# Patient Record
Sex: Male | Born: 1935 | Race: Black or African American | Hispanic: No | Marital: Married | Smoking: Former smoker
Health system: Southern US, Community
[De-identification: ages and names within clinical notes are randomized; demographics above are authoritative.]

## PROBLEM LIST (undated history)

## (undated) DIAGNOSIS — K219 Gastro-esophageal reflux disease without esophagitis: Secondary | ICD-10-CM

## (undated) DIAGNOSIS — I499 Cardiac arrhythmia, unspecified: Secondary | ICD-10-CM

## (undated) DIAGNOSIS — C801 Malignant (primary) neoplasm, unspecified: Secondary | ICD-10-CM

## (undated) DIAGNOSIS — Z940 Kidney transplant status: Secondary | ICD-10-CM

## (undated) DIAGNOSIS — I10 Essential (primary) hypertension: Secondary | ICD-10-CM

## (undated) DIAGNOSIS — N186 End stage renal disease: Secondary | ICD-10-CM

## (undated) DIAGNOSIS — R0602 Shortness of breath: Secondary | ICD-10-CM

## (undated) DIAGNOSIS — J189 Pneumonia, unspecified organism: Secondary | ICD-10-CM

## (undated) DIAGNOSIS — E611 Iron deficiency: Secondary | ICD-10-CM

## (undated) DIAGNOSIS — R011 Cardiac murmur, unspecified: Secondary | ICD-10-CM

## (undated) DIAGNOSIS — N390 Urinary tract infection, site not specified: Secondary | ICD-10-CM

## (undated) DIAGNOSIS — M1990 Unspecified osteoarthritis, unspecified site: Secondary | ICD-10-CM

## (undated) DIAGNOSIS — I639 Cerebral infarction, unspecified: Secondary | ICD-10-CM

## (undated) HISTORY — DX: Urinary tract infection, site not specified: N39.0

## (undated) HISTORY — DX: Unspecified osteoarthritis, unspecified site: M19.90

## (undated) HISTORY — PX: AV FISTULA PLACEMENT: SHX1204

## (undated) HISTORY — DX: Iron deficiency: E61.1

## (undated) HISTORY — PX: PROSTATE SURGERY: SHX751

## (undated) HISTORY — DX: Cerebral infarction, unspecified: I63.9

## (undated) HISTORY — PX: KIDNEY TRANSPLANT: SHX239

---

## 2000-09-06 ENCOUNTER — Encounter: Payer: Self-pay | Admitting: Internal Medicine

## 2000-09-06 ENCOUNTER — Ambulatory Visit (HOSPITAL_COMMUNITY): Admission: RE | Admit: 2000-09-06 | Discharge: 2000-09-06 | Payer: Self-pay | Admitting: Internal Medicine

## 2000-09-23 ENCOUNTER — Encounter (INDEPENDENT_AMBULATORY_CARE_PROVIDER_SITE_OTHER): Payer: Self-pay | Admitting: Specialist

## 2000-09-23 ENCOUNTER — Ambulatory Visit (HOSPITAL_COMMUNITY): Admission: RE | Admit: 2000-09-23 | Discharge: 2000-09-23 | Payer: Self-pay | Admitting: Gastroenterology

## 2002-09-08 ENCOUNTER — Ambulatory Visit (HOSPITAL_COMMUNITY): Admission: RE | Admit: 2002-09-08 | Discharge: 2002-09-08 | Payer: Self-pay | Admitting: Internal Medicine

## 2002-09-08 ENCOUNTER — Encounter: Payer: Self-pay | Admitting: Internal Medicine

## 2003-02-01 ENCOUNTER — Inpatient Hospital Stay (HOSPITAL_COMMUNITY): Admission: EM | Admit: 2003-02-01 | Discharge: 2003-02-04 | Payer: Self-pay | Admitting: Emergency Medicine

## 2003-02-01 ENCOUNTER — Encounter: Payer: Self-pay | Admitting: Emergency Medicine

## 2003-02-01 ENCOUNTER — Encounter (INDEPENDENT_AMBULATORY_CARE_PROVIDER_SITE_OTHER): Payer: Self-pay | Admitting: *Deleted

## 2003-02-02 ENCOUNTER — Encounter: Payer: Self-pay | Admitting: Internal Medicine

## 2003-03-08 ENCOUNTER — Encounter (HOSPITAL_COMMUNITY): Admission: RE | Admit: 2003-03-08 | Discharge: 2003-06-06 | Payer: Self-pay | Admitting: Nephrology

## 2003-05-11 ENCOUNTER — Encounter (INDEPENDENT_AMBULATORY_CARE_PROVIDER_SITE_OTHER): Payer: Self-pay

## 2003-05-11 ENCOUNTER — Ambulatory Visit (HOSPITAL_COMMUNITY): Admission: RE | Admit: 2003-05-11 | Discharge: 2003-05-11 | Payer: Self-pay | Admitting: Gastroenterology

## 2003-09-09 ENCOUNTER — Ambulatory Visit (HOSPITAL_COMMUNITY): Admission: RE | Admit: 2003-09-09 | Discharge: 2003-09-09 | Payer: Self-pay | Admitting: Internal Medicine

## 2004-06-22 ENCOUNTER — Encounter: Admission: RE | Admit: 2004-06-22 | Discharge: 2004-06-22 | Payer: Self-pay | Admitting: Plastic Surgery

## 2005-02-27 ENCOUNTER — Ambulatory Visit: Admission: RE | Admit: 2005-02-27 | Discharge: 2005-05-03 | Payer: Self-pay | Admitting: Radiation Oncology

## 2005-06-04 ENCOUNTER — Encounter: Admission: RE | Admit: 2005-06-04 | Discharge: 2005-06-04 | Payer: Self-pay | Admitting: Urology

## 2005-06-11 ENCOUNTER — Ambulatory Visit: Admission: RE | Admit: 2005-06-11 | Discharge: 2005-08-02 | Payer: Self-pay | Admitting: Radiation Oncology

## 2005-06-11 ENCOUNTER — Ambulatory Visit (HOSPITAL_COMMUNITY): Admission: RE | Admit: 2005-06-11 | Discharge: 2005-06-11 | Payer: Self-pay | Admitting: Urology

## 2005-06-11 ENCOUNTER — Ambulatory Visit (HOSPITAL_BASED_OUTPATIENT_CLINIC_OR_DEPARTMENT_OTHER): Admission: RE | Admit: 2005-06-11 | Discharge: 2005-06-11 | Payer: Self-pay | Admitting: Urology

## 2005-06-26 ENCOUNTER — Observation Stay (HOSPITAL_COMMUNITY): Admission: AD | Admit: 2005-06-26 | Discharge: 2005-06-27 | Payer: Self-pay | Admitting: Cardiology

## 2005-07-27 ENCOUNTER — Ambulatory Visit (HOSPITAL_COMMUNITY): Admission: RE | Admit: 2005-07-27 | Discharge: 2005-07-27 | Payer: Self-pay | Admitting: Urology

## 2005-07-27 ENCOUNTER — Ambulatory Visit (HOSPITAL_BASED_OUTPATIENT_CLINIC_OR_DEPARTMENT_OTHER): Admission: RE | Admit: 2005-07-27 | Discharge: 2005-07-27 | Payer: Self-pay | Admitting: Urology

## 2005-09-28 ENCOUNTER — Inpatient Hospital Stay (HOSPITAL_COMMUNITY): Admission: AD | Admit: 2005-09-28 | Discharge: 2005-10-01 | Payer: Self-pay | Admitting: Internal Medicine

## 2005-12-05 ENCOUNTER — Encounter: Payer: Self-pay | Admitting: Vascular Surgery

## 2005-12-05 ENCOUNTER — Ambulatory Visit (HOSPITAL_COMMUNITY): Admission: RE | Admit: 2005-12-05 | Discharge: 2005-12-05 | Payer: Self-pay | Admitting: Nephrology

## 2006-01-17 ENCOUNTER — Encounter (HOSPITAL_COMMUNITY): Admission: RE | Admit: 2006-01-17 | Discharge: 2006-04-17 | Payer: Self-pay | Admitting: Nephrology

## 2006-05-20 ENCOUNTER — Observation Stay (HOSPITAL_COMMUNITY): Admission: EM | Admit: 2006-05-20 | Discharge: 2006-05-20 | Payer: Self-pay | Admitting: Emergency Medicine

## 2006-11-20 ENCOUNTER — Encounter: Payer: Self-pay | Admitting: Vascular Surgery

## 2006-11-20 ENCOUNTER — Ambulatory Visit: Payer: Self-pay | Admitting: Vascular Surgery

## 2006-11-20 ENCOUNTER — Ambulatory Visit (HOSPITAL_COMMUNITY): Admission: RE | Admit: 2006-11-20 | Discharge: 2006-11-20 | Payer: Self-pay | Admitting: Internal Medicine

## 2007-07-03 ENCOUNTER — Inpatient Hospital Stay (HOSPITAL_COMMUNITY): Admission: EM | Admit: 2007-07-03 | Discharge: 2007-07-11 | Payer: Self-pay | Admitting: Emergency Medicine

## 2007-09-23 ENCOUNTER — Ambulatory Visit: Payer: Self-pay | Admitting: Vascular Surgery

## 2007-10-03 ENCOUNTER — Emergency Department (HOSPITAL_COMMUNITY): Admission: EM | Admit: 2007-10-03 | Discharge: 2007-10-03 | Payer: Self-pay | Admitting: Emergency Medicine

## 2007-11-24 ENCOUNTER — Encounter (HOSPITAL_COMMUNITY): Admission: RE | Admit: 2007-11-24 | Discharge: 2008-02-22 | Payer: Self-pay | Admitting: Nephrology

## 2007-12-19 ENCOUNTER — Encounter (INDEPENDENT_AMBULATORY_CARE_PROVIDER_SITE_OTHER): Payer: Self-pay | Admitting: Urology

## 2007-12-19 ENCOUNTER — Inpatient Hospital Stay (HOSPITAL_COMMUNITY): Admission: RE | Admit: 2007-12-19 | Discharge: 2007-12-20 | Payer: Self-pay | Admitting: Urology

## 2008-04-14 ENCOUNTER — Ambulatory Visit (HOSPITAL_COMMUNITY): Admission: RE | Admit: 2008-04-14 | Discharge: 2008-04-14 | Payer: Self-pay | Admitting: Vascular Surgery

## 2008-04-14 ENCOUNTER — Ambulatory Visit: Payer: Self-pay | Admitting: Vascular Surgery

## 2008-05-25 ENCOUNTER — Ambulatory Visit: Payer: Self-pay | Admitting: Vascular Surgery

## 2008-07-06 ENCOUNTER — Encounter (INDEPENDENT_AMBULATORY_CARE_PROVIDER_SITE_OTHER): Payer: Self-pay | Admitting: Urology

## 2008-07-06 ENCOUNTER — Ambulatory Visit (HOSPITAL_BASED_OUTPATIENT_CLINIC_OR_DEPARTMENT_OTHER): Admission: RE | Admit: 2008-07-06 | Discharge: 2008-07-07 | Payer: Self-pay | Admitting: Urology

## 2008-07-22 ENCOUNTER — Ambulatory Visit (HOSPITAL_COMMUNITY): Admission: RE | Admit: 2008-07-22 | Discharge: 2008-07-22 | Payer: Self-pay | Admitting: Urology

## 2008-08-31 ENCOUNTER — Encounter (HOSPITAL_COMMUNITY): Admission: RE | Admit: 2008-08-31 | Discharge: 2008-11-29 | Payer: Self-pay | Admitting: Nephrology

## 2009-08-06 DIAGNOSIS — N186 End stage renal disease: Secondary | ICD-10-CM

## 2009-08-06 HISTORY — DX: End stage renal disease: N18.6

## 2009-09-29 ENCOUNTER — Ambulatory Visit (HOSPITAL_COMMUNITY): Admission: RE | Admit: 2009-09-29 | Discharge: 2009-09-29 | Payer: Self-pay | Admitting: Internal Medicine

## 2010-01-12 ENCOUNTER — Ambulatory Visit: Payer: Self-pay | Admitting: Vascular Surgery

## 2010-05-08 ENCOUNTER — Ambulatory Visit: Payer: Self-pay | Admitting: Vascular Surgery

## 2010-05-08 ENCOUNTER — Ambulatory Visit (HOSPITAL_COMMUNITY): Admission: AD | Admit: 2010-05-08 | Discharge: 2010-05-08 | Payer: Self-pay | Admitting: Vascular Surgery

## 2010-05-17 ENCOUNTER — Inpatient Hospital Stay (HOSPITAL_COMMUNITY): Admission: EM | Admit: 2010-05-17 | Discharge: 2010-05-18 | Payer: Self-pay | Admitting: Emergency Medicine

## 2010-05-18 ENCOUNTER — Encounter (INDEPENDENT_AMBULATORY_CARE_PROVIDER_SITE_OTHER): Payer: Self-pay | Admitting: Interventional Cardiology

## 2010-05-19 ENCOUNTER — Emergency Department (HOSPITAL_COMMUNITY): Admission: EM | Admit: 2010-05-19 | Discharge: 2010-05-19 | Payer: Self-pay | Admitting: Emergency Medicine

## 2010-05-22 ENCOUNTER — Ambulatory Visit (HOSPITAL_COMMUNITY): Admission: RE | Admit: 2010-05-22 | Discharge: 2010-05-22 | Payer: Self-pay | Admitting: Nephrology

## 2010-06-15 ENCOUNTER — Encounter: Admission: RE | Admit: 2010-06-15 | Discharge: 2010-06-15 | Payer: Self-pay | Admitting: Internal Medicine

## 2010-06-20 ENCOUNTER — Ambulatory Visit (HOSPITAL_COMMUNITY)
Admission: RE | Admit: 2010-06-20 | Discharge: 2010-06-20 | Payer: Self-pay | Source: Home / Self Care | Admitting: Nephrology

## 2010-08-08 ENCOUNTER — Ambulatory Visit (HOSPITAL_COMMUNITY)
Admission: RE | Admit: 2010-08-08 | Discharge: 2010-08-08 | Payer: Self-pay | Source: Home / Self Care | Attending: Gastroenterology | Admitting: Gastroenterology

## 2010-08-26 ENCOUNTER — Encounter: Payer: Self-pay | Admitting: Internal Medicine

## 2010-08-26 ENCOUNTER — Encounter: Payer: Self-pay | Admitting: Nephrology

## 2010-09-18 ENCOUNTER — Emergency Department (HOSPITAL_COMMUNITY): Payer: Medicare Other

## 2010-09-18 ENCOUNTER — Inpatient Hospital Stay (HOSPITAL_COMMUNITY)
Admission: EM | Admit: 2010-09-18 | Discharge: 2010-09-22 | DRG: 280 | Disposition: A | Discharge status: Home / Self Care | Payer: Medicare Other | Attending: Interventional Cardiology | Admitting: Interventional Cardiology

## 2010-09-18 DIAGNOSIS — E119 Type 2 diabetes mellitus without complications: Secondary | ICD-10-CM | POA: Diagnosis present

## 2010-09-18 DIAGNOSIS — I359 Nonrheumatic aortic valve disorder, unspecified: Secondary | ICD-10-CM | POA: Diagnosis present

## 2010-09-18 DIAGNOSIS — Z992 Dependence on renal dialysis: Secondary | ICD-10-CM

## 2010-09-18 DIAGNOSIS — I214 Non-ST elevation (NSTEMI) myocardial infarction: Secondary | ICD-10-CM | POA: Diagnosis present

## 2010-09-18 DIAGNOSIS — I120 Hypertensive chronic kidney disease with stage 5 chronic kidney disease or end stage renal disease: Secondary | ICD-10-CM | POA: Diagnosis present

## 2010-09-18 DIAGNOSIS — N186 End stage renal disease: Secondary | ICD-10-CM | POA: Diagnosis present

## 2010-09-18 DIAGNOSIS — K269 Duodenal ulcer, unspecified as acute or chronic, without hemorrhage or perforation: Secondary | ICD-10-CM | POA: Diagnosis present

## 2010-09-18 DIAGNOSIS — I509 Heart failure, unspecified: Secondary | ICD-10-CM | POA: Diagnosis present

## 2010-09-18 DIAGNOSIS — I4891 Unspecified atrial fibrillation: Principal | ICD-10-CM | POA: Diagnosis present

## 2010-09-18 LAB — CBC
HCT: 34.4 % — ABNORMAL LOW (ref 39.0–52.0)
Hemoglobin: 12.2 g/dL — ABNORMAL LOW (ref 13.0–17.0)
MCV: 80 fL (ref 78.0–100.0)
RDW: 15.2 % (ref 11.5–15.5)
WBC: 9.2 10*3/uL (ref 4.0–10.5)

## 2010-09-18 LAB — CK TOTAL AND CKMB (NOT AT ARMC)
CK, MB: 2.1 ng/mL (ref 0.3–4.0)
Relative Index: INVALID (ref 0.0–2.5)
Total CK: 70 U/L (ref 7–232)

## 2010-09-18 LAB — BASIC METABOLIC PANEL
BUN: 27 mg/dL — ABNORMAL HIGH (ref 6–23)
Chloride: 98 mEq/L (ref 96–112)
GFR calc Af Amer: 12 mL/min — ABNORMAL LOW (ref >60)
GFR calc non Af Amer: 10 mL/min — ABNORMAL LOW (ref >60)
Potassium: 3.3 mEq/L — ABNORMAL LOW (ref 3.5–5.1)
Sodium: 139 mEq/L (ref 135–145)

## 2010-09-18 LAB — DIFFERENTIAL
Basophils Absolute: 0 10*3/uL (ref 0.0–0.1)
Eosinophils Relative: 2 % (ref 0–5)
Lymphocytes Relative: 18 % (ref 12–46)
Lymphs Abs: 1.7 10*3/uL (ref 0.7–4.0)
Neutro Abs: 6.9 10*3/uL (ref 1.7–7.7)

## 2010-09-18 LAB — APTT: aPTT: 99 seconds — ABNORMAL HIGH (ref 24–37)

## 2010-09-18 LAB — TROPONIN I: Troponin I: 0.03 ng/mL (ref 0.00–0.06)

## 2010-09-18 LAB — PROTIME-INR: INR: 1.13 (ref 0.00–1.49)

## 2010-09-19 LAB — PROTIME-INR: Prothrombin Time: 14.5 seconds (ref 11.6–15.2)

## 2010-09-19 LAB — CBC
HCT: 35.5 % — ABNORMAL LOW (ref 39.0–52.0)
Hemoglobin: 12 g/dL — ABNORMAL LOW (ref 13.0–17.0)
MCH: 27.3 pg (ref 26.0–34.0)
MCHC: 33.8 g/dL (ref 30.0–36.0)
MCV: 80.9 fL (ref 78.0–100.0)
RDW: 15.5 % (ref 11.5–15.5)

## 2010-09-19 LAB — CARDIAC PANEL(CRET KIN+CKTOT+MB+TROPI)
CK, MB: 14 ng/mL (ref 0.3–4.0)
Relative Index: 7.8 — ABNORMAL HIGH (ref 0.0–2.5)
Total CK: 265 U/L — ABNORMAL HIGH (ref 7–232)
Troponin I: 3.62 ng/mL (ref 0.00–0.06)
Troponin I: 5.06 ng/mL (ref 0.00–0.06)

## 2010-09-19 LAB — HEPARIN LEVEL (UNFRACTIONATED): Heparin Unfractionated: 0.38 IU/mL (ref 0.30–0.70)

## 2010-09-20 LAB — GLUCOSE, CAPILLARY
Glucose-Capillary: 99 mg/dL (ref 70–99)
Glucose-Capillary: 80 mg/dL (ref 70–99)

## 2010-09-20 LAB — BASIC METABOLIC PANEL
CO2: 27 mEq/L (ref 19–32)
Chloride: 102 mEq/L (ref 96–112)
Creatinine, Ser: 7.54 mg/dL — ABNORMAL HIGH (ref 0.4–1.5)
GFR calc Af Amer: 9 mL/min — ABNORMAL LOW (ref >60)
Glucose, Bld: 95 mg/dL (ref 70–99)
Sodium: 139 mEq/L (ref 135–145)

## 2010-09-20 LAB — CBC
HCT: 35.7 % — ABNORMAL LOW (ref 39.0–52.0)
Hemoglobin: 12 g/dL — ABNORMAL LOW (ref 13.0–17.0)
MCH: 27 pg (ref 26.0–34.0)
RBC: 4.44 MIL/uL (ref 4.22–5.81)

## 2010-09-20 LAB — CARDIAC PANEL(CRET KIN+CKTOT+MB+TROPI)
CK, MB: 4.6 ng/mL — ABNORMAL HIGH (ref 0.3–4.0)
Total CK: 275 U/L — ABNORMAL HIGH (ref 7–232)

## 2010-09-20 LAB — PROTIME-INR: Prothrombin Time: 14.8 seconds (ref 11.6–15.2)

## 2010-09-21 LAB — CBC
Hemoglobin: 12.3 g/dL — ABNORMAL LOW (ref 13.0–17.0)
MCHC: 34.6 g/dL (ref 30.0–36.0)
RBC: 4.41 MIL/uL (ref 4.22–5.81)
WBC: 5.8 10*3/uL (ref 4.0–10.5)

## 2010-09-21 LAB — GLUCOSE, CAPILLARY
Glucose-Capillary: 139 mg/dL — ABNORMAL HIGH (ref 70–99)
Glucose-Capillary: 113 mg/dL — ABNORMAL HIGH (ref 70–99)

## 2010-09-21 LAB — PROTIME-INR
INR: 1.17 (ref 0.00–1.49)
Prothrombin Time: 15.1 seconds (ref 11.6–15.2)

## 2010-09-21 NOTE — H&P (Signed)
NAMEELMORE, HYSLOP                ACCOUNT NO.:  [id number]  MEDICAL RECORD NO.:  [id number]           PATIENT TYPE:  I  LOCATION:  2012                         FACILITY:  [facility]  PHYSICIAN:  Georga Hacking, M.D.DATE OF BIRTH:  [birth year]  DATE OF ADMISSION:  09/18/2010                             HISTORY & PHYSICAL   REASON FOR ADMISSION:  Recurrent atrial fibrillation.  HISTORY OF PRESENT ILLNESS:  The patient is a very nice 77 year old black male who has a history of end-stage renal disease on hemodialysis. He has longstanding diabetes mellitus with renal failure as well as hypertension.  He was admitted by Dr. Katrinka Blazing in October when he developed atrial fibrillation associated with hypotension on dialysis.  At that time, decision was made not to place him on warfarin therapy and he converted to sinus rhythm on amiodarone.  Amiodarone was eventually tapered off and he has been on no antiarrhythmics recently.  While in dialysis today, he developed atrial fibrillation with rapid response and became somewhat hypotensive responding to a fluid bolus.  He was placed on a diltiazem drip and was brought to the emergency room and is currently in atrial fibrillation with controlled response and his pressure is fine.  He is feeling fine.  He had chest discomfort when he had rapid atrial fibrillation, but is not currently having any at the present time.  PAST MEDICAL HISTORY:  Remarkable for hyperlipidemia, hypertension, diabetes mellitus, and iron-deficiency anemia.  He has a previous history of peptic ulcer disease and duodenal ulcer with previous bleeding.  He has end-stage renal disease on chronic hemodialysis.  He has a history of prostate cancer with TURP previously.  ALLERGIES:  None.  PAST SURGERIES:  TURP in 2009.  He has had multiple procedures for a fistula access.  SOCIAL HISTORY:  He is married, with a daughter.  He denies smoking or alcohol use.  He was previously  Engineer, technical sales at [employer].  FAMILY HISTORY:  Negative for coronary artery disease.  Mother died of cancer at 17.  REVIEW OF SYSTEMS:  He is having some burping and some dyspepsia.  He is due to see Dr. Ewing Schlein coming up.  He is on chronic acid suppression.  He wears glasses.  He has no other eye, ear, nose, or throat problems.  No difficulty swallowing.  He complains of occasional mild arthritis involving his hands.  He does not have any trouble with knee arthritis. He is not having chest pain suggestive of angina and does not have congestive heart failure symptoms.  Other than as noted above, the remainder of systems is unremarkable.  PHYSICAL EXAMINATION:  GENERAL:  He is a pleasant, black male, appearing younger than stated age. VITAL SIGNS:  Blood pressure is currently 120/70.  Pulse is currently 80- 90 and irregular. SKIN:  Warm and dry. ENT:  EOMI.  PERRLA.  C&S clear.  Fundi not examined.  Pharynx is negative. NECK:  Supple without masses, JVD, thyromegaly, or bruits. LUNGS:  Clear to A&P. CARDIOVASCULAR:  S1 and S2.  There is a 1-2/6 systolic murmur at the aortic area.  There is no  S3. ABDOMEN:  Soft and nontender.  Femoral distal pulses are present. NEUROLOGIC:  Normal cranial nerves.  Sensory and motor are intact.  EKG shows atrial fibrillation with controlled response at the present time, previously had some ST depression associated with atrial fibrillation.  LABORATORY DATA:  Hemoglobin of 12.2, hematocrit 34.4, and platelet count 147,000.  Sodium is 139, potassium 3.3, chloride 98, CO2 of 28, glucose 108, BUN 27, and creatinine 5.49.  Initial cardiac enzyme was negative.  Portable chest x-ray shows a vague right perihilar opacity noted.  Previous echocardiogram was done in October.  IMPRESSION: 1. Recurrent atrial fibrillation occurring in the setting of hemodialysis. 2. Hypertensive heart disease. 3. Diabetes mellitus, not controlled. 4.  End-stage renal disease, on dialysis. 5. History of peptic ulcer disease with previous bleeding remotely.  RECOMMENDATIONS:  The patient will be admitted to the hospital.  We will hold his aspirin.  Continue his regular medications and he will need to go back on amiodarone.  Continue diltiazem at the present time.  Because of CHADS score of 3, likely he will need to go on warfarin anticoagulation, but does have known history of GI bleeding.     Georga Hacking, M.D.     WST/MEDQ  D:  09/18/2010  T:  09/19/2010  Job:  [id number]  cc:   Lyn Records, M.D. Richard F. Caryn Section, M.D. Thora Lance, M.D.  Electronically Signed by Lacretia Nicks. Donnie Aho M.D. on 09/21/2010 09:37:22 AM

## 2010-09-22 ENCOUNTER — Inpatient Hospital Stay (HOSPITAL_COMMUNITY): Payer: Medicare Other

## 2010-09-22 LAB — RENAL FUNCTION PANEL
Albumin: 3.1 g/dL — ABNORMAL LOW (ref 3.5–5.2)
BUN: 24 mg/dL — ABNORMAL HIGH (ref 6–23)
CO2: 27 mEq/L (ref 19–32)
Chloride: 102 mEq/L (ref 96–112)
Creatinine, Ser: 5.95 mg/dL — ABNORMAL HIGH (ref 0.4–1.5)

## 2010-09-22 LAB — CBC
MCV: 80.3 fL (ref 78.0–100.0)
Platelets: 149 10*3/uL — ABNORMAL LOW (ref 150–400)
RDW: 15 % (ref 11.5–15.5)
WBC: 6.2 10*3/uL (ref 4.0–10.5)

## 2010-09-22 LAB — PROTIME-INR: INR: 1.53 — ABNORMAL HIGH (ref 0.00–1.49)

## 2010-10-13 NOTE — Procedures (Signed)
Evan [name], Evan [name] NO.:  [id number]  MEDICAL RECORD NO.:  [id number]           PATIENT TYPE:  I  LOCATION:  2012                         FACILITY:  [facility]  PHYSICIAN:  Lyn Records, M.D.   DATE OF BIRTH:  21-Aug-1931  DATE OF PROCEDURE:  09/20/2010 DATE OF DISCHARGE:                           CARDIAC CATHETERIZATION   INDICATIONS:  Non-ST-elevation myocardial infarction, atrial fibrillation with rapid ventricular response, aortic stenosis.  PROCEDURE PERFORMED:  Coronary angiography via right radial approach.  DESCRIPTION OF PROCEDURE:  The patient was brought to the Catheterization Laboratory after having signed informed consent.  The right arm and radial area was sterilely prepped and draped.  A 1% Xylocaine local infiltration was given.  A micro puncture needle set was then used to perform a single anterior stick into the radial artery.  A 5-French sheath was placed using the modified Seldinger technique.  We then used a 5-French A2 multipurpose catheter for hemodynamic recordings, and right coronary angiography.  This catheter was advanced to the ascending aorta over a tight J 0.035 wire, 300-cm long.  We were unable to cannulate the left coronary with a multipurpose catheter due to configuration of the aorta.  We exchanged over the 300-cm long tight J-wire and used a 3.5 x 5 French left Judkins catheter.  With some manipulation, this catheter selectively engaged the left main and angiography was performed.  We did not attempt across the aortic valve. A 60 mL of contrast was used during the procedure.  Hemostasis was achieved with radial wrist band.  No complications occurred.  RESULTS: 1. Hemodynamic data:     a.     Aortic pressure 150/50 mmHg.  The left ventricular pressure      not recorded. 2. Left ventriculography:  Not performed.  Recent documentation of     overall normal LV function at the time of nuclear study and      echocardiography. 3. Coronary angiography.     a.     Left main coronary:  Widely patent.     b.     Left anterior descending coronary:  There is a 25-30% mid      LAD myocardial bridge noted in the midvessel.  Left anterior      descending system is otherwise widely patent.     c.     Circumflex artery:  There is ostial and proximal      irregularity.  The vessel is large and otherwise free of any      obstruction.     d.     Right coronary:  The right coronary is tortuous and      partially calcified.  Proximal to mid segment contains eccentric      40-50% narrowing.  Large PDA and left ventricular branch arise      distally.  No high-grade obstruction is noted in the right      coronary.  CONCLUSIONS: 1. Widely patent coronary arteries.  40-50% proximal RCA narrowing.     25-30% mid LAD narrowing with myocardial bridge also being noted.     There is ostial irregularity  in the circumflex.  No significant     obstruction is seen. 2. No hemodynamic recordings across the aortic valve were performed     and left ventriculography was not performed. 3. Enzyme elevation likely related to demand ischemia/injury in the     setting of aortic stenosis, atrial fib with rapid ventricular     response, left ventricular hypertrophy, and the hemodynamic     stresses of dialysis.  PLAN:  Continue loading the patient with Coumadin and amiodarone. Discharge relatively soon, hopefully within the next 24 hours.     Lyn Records, M.D.     HWS/MEDQ  D:  09/20/2010  T:  09/21/2010  Job:  [id number]  cc:   Margaretmary Bayley, M.D.  [facility]  Electronically Signed by Verdis Prime M.D. on 10/12/2010 09:23:12 AM

## 2010-10-13 NOTE — Discharge Summary (Signed)
NAMEINFANT, ZINK NO.:  [id number]  MEDICAL RECORD NO.:  [id number]           PATIENT TYPE:  I  LOCATION:  2012                         FACILITY:  [facility]  PHYSICIAN:  Lyn Records, M.D.   DATE OF BIRTH:  [birth year]  DATE OF ADMISSION:  09/18/2010 DATE OF DISCHARGE:  09/22/2010                              DISCHARGE SUMMARY   REASON FOR ADMISSION TO THE HOSPITAL:  Atrial fibrillation starting during dialysis.  DISCHARGE DIAGNOSES: 1. Paroxysmal atrial fibrillation with spontaneous conversion.     a.     Congestive heart failure, hypertension, age, diabetes, prior      stroke score of 3.     b.     Aortic stenosis.     c.     Normal left ventricular function. 2. Non-ST-elevation myocardial infarction documented by cardiac enzyme     elevation.     a.     Peak troponin-I 5.06.     b.     Widely patent coronary arteries by angiography on September 20, 2010 3. Aortic stenosis. 4. End-stage renal disease.     a.     Hemodialysis. 5. Diabetes mellitus. 6. Hypertension. 7. History of duodenal ulcer with GI bleeding in 2004.  PROCEDURES PERFORMED: 1. Hemodialysis x2 over the hospital stay. 2. Coronary angiography on September 20, 2010, Dr. Verdis Prime.  DISCHARGE INSTRUCTIONS:  The patient will continue dialysis on his usual schedule.  The medication regimen is as follows: 1. Amiodarone 200 mg b.i.d. until October 05, 2010, when he would     decrease the dose to 200 mg per day. 2. Aspirin 81 mg per day. 3. Calcium chewable 1 tablet three times per day. 4. Sertraline 10 mg 2 tablets as needed. 5. Dialysis 3 times per week. 6. Losartan 100 mg daily at bedtime. 7. Prilosec 20 mg 2 capsules twice a day. 8. Tylenol 325 mg 1-2 tablets as needed for pain. 9. Coumadin was initially contemplated, but after discussion with the     Renal Service/Dr. Caryn Section, we have decided to continue with aspirin     rather than using Coumadin therapy and reevaluate this  question in     the future depending upon the patient's tendency to redevelop     atrial fibrillation.  ACTIVITIES:  As tolerated.  DIET:  Renal failure/ADA.  FOLLOWUP APPOINTMENTS:  Dr. Verdis Prime on October 09, 2010, at 11:15 a.m.  HISTORY AND PHYSICAL AND HOSPITAL COURSE:  Please see the admitting history and physical.  [name] was admitted to the hospital after developing severe chest pressure and rapid heart rate while at dialysis. He feels that his chest got type before atrial fibrillation developed.  He was started on IV Cardizem and converted several hours after admission to the hospital.  Serial cardiac markers, however, demonstrated a bump in troponin and CK-MB levels with peak and trough in troponin of 3.62, 5.06, and 2.8.  The initial CK-MB was 13, subsequent CK-MB 14, and followup CK-MB 24 hours after admission was normal at 4.6.  Catheterization was performed to exclude the possibility of  plaque rupture/significant obstructive coronary disease.  This was performed via the right radial approach on September 20, 2010.  The patient had moderate plaque within the coronary with up to 60% narrowing in the right coronary.  No high-grade disease was noted.  We began anticoagulating the patient with Coumadin.  His history of peptic ulcer disease with bleeding was noted.  After discussion with Dr. Hermina Barters [facility], we decided to hold off on starting Coumadin therapy and simply continue aspirin each day.  We would hold for rhythm control with amiodarone.  Should he continue to have recurrences, we should again at least consider Coumadin therapy given the patient's high CHADS score of 3.  After dialysis on September 22, 2010, the patient was felt to be eligible for discharge.  He will follow up with Dr. Verdis Prime for monitoring of recurrent atrial fibrillation.     Lyn Records, M.D.     HWS/MEDQ  D:  09/22/2010  T:  09/22/2010  Job:  [id number]  cc:    [facility]. Margaretmary Bayley, M.D.  Electronically Signed by Verdis Prime M.D. on 10/12/2010 09:23:09 AM

## 2010-10-18 LAB — CBC
HCT: 33.5 % — ABNORMAL LOW (ref 39.0–52.0)
HCT: 36 % — ABNORMAL LOW (ref 39.0–52.0)
HCT: 36.1 % — ABNORMAL LOW (ref 39.0–52.0)
Hemoglobin: 11.9 g/dL — ABNORMAL LOW (ref 13.0–17.0)
Hemoglobin: 12 g/dL — ABNORMAL LOW (ref 13.0–17.0)
Hemoglobin: 12.1 g/dL — ABNORMAL LOW (ref 13.0–17.0)
MCH: 26.7 pg (ref 26.0–34.0)
MCH: 26.7 pg (ref 26.0–34.0)
MCHC: 33.5 g/dL (ref 30.0–36.0)
MCV: 80 fL (ref 78.0–100.0)
MCV: 80.5 fL (ref 78.0–100.0)
RBC: 4.16 MIL/uL — ABNORMAL LOW (ref 4.22–5.81)
RBC: 4.46 MIL/uL (ref 4.22–5.81)
RBC: 4.51 MIL/uL (ref 4.22–5.81)
RDW: 15.5 % (ref 11.5–15.5)
WBC: 7.8 10*3/uL (ref 4.0–10.5)
WBC: 8.9 10*3/uL (ref 4.0–10.5)
WBC: 9.6 10*3/uL (ref 4.0–10.5)

## 2010-10-18 LAB — POCT CARDIAC MARKERS
CKMB, poc: 1.3 ng/mL (ref 1.0–8.0)
Myoglobin, poc: 406 ng/mL (ref 12–200)
Troponin i, poc: 0.05 ng/mL (ref 0.00–0.09)
Troponin i, poc: <0.05 ng/mL (ref 0.00–0.09)

## 2010-10-18 LAB — COMPREHENSIVE METABOLIC PANEL
ALT: 13 U/L (ref 0–53)
Alkaline Phosphatase: 56 U/L (ref 39–117)
BUN: 15 mg/dL (ref 6–23)
Chloride: 103 mEq/L (ref 96–112)
Glucose, Bld: 119 mg/dL — ABNORMAL HIGH (ref 70–99)
Potassium: 3.4 mEq/L — ABNORMAL LOW (ref 3.5–5.1)
Sodium: 137 mEq/L (ref 135–145)
Total Bilirubin: 0.6 mg/dL (ref 0.3–1.2)

## 2010-10-18 LAB — BASIC METABOLIC PANEL
BUN: 23 mg/dL (ref 6–23)
CO2: 25 mEq/L (ref 19–32)
Calcium: 7.6 mg/dL — ABNORMAL LOW (ref 8.4–10.5)
Chloride: 104 mEq/L (ref 96–112)
Creatinine, Ser: 4.51 mg/dL — ABNORMAL HIGH (ref 0.4–1.5)
GFR calc Af Amer: 15 mL/min — ABNORMAL LOW (ref >60)

## 2010-10-18 LAB — DIFFERENTIAL
Basophils Absolute: 0 10*3/uL (ref 0.0–0.1)
Lymphocytes Relative: 20 % (ref 12–46)
Monocytes Absolute: 0.6 10*3/uL (ref 0.1–1.0)
Neutro Abs: 6.3 10*3/uL (ref 1.7–7.7)
Neutrophils Relative %: 72 % (ref 43–77)

## 2010-10-18 LAB — GLUCOSE, CAPILLARY
Glucose-Capillary: 154 mg/dL — ABNORMAL HIGH (ref 70–99)
Glucose-Capillary: 176 mg/dL — ABNORMAL HIGH (ref 70–99)
Glucose-Capillary: 198 mg/dL — ABNORMAL HIGH (ref 70–99)

## 2010-10-18 LAB — POCT I-STAT, CHEM 8
BUN: 15 mg/dL (ref 6–23)
BUN: 17 mg/dL (ref 6–23)
Chloride: 99 mEq/L (ref 96–112)
Creatinine, Ser: 3.1 mg/dL — ABNORMAL HIGH (ref 0.4–1.5)
Creatinine, Ser: 3.7 mg/dL — ABNORMAL HIGH (ref 0.4–1.5)
Glucose, Bld: 270 mg/dL — ABNORMAL HIGH (ref 70–99)
Hemoglobin: 13.9 g/dL (ref 13.0–17.0)
Potassium: 3.4 mEq/L — ABNORMAL LOW (ref 3.5–5.1)
Potassium: 3.6 mEq/L (ref 3.5–5.1)
Sodium: 137 mEq/L (ref 135–145)
Sodium: 139 mEq/L (ref 135–145)

## 2010-10-18 LAB — CK TOTAL AND CKMB (NOT AT ARMC)
CK, MB: 2.1 ng/mL (ref 0.3–4.0)
Relative Index: 2 (ref 0.0–2.5)
Total CK: 104 U/L (ref 7–232)

## 2010-10-18 LAB — PROTIME-INR
INR: 0.99 (ref 0.00–1.49)
Prothrombin Time: 13.3 seconds (ref 11.6–15.2)

## 2010-10-18 LAB — HEPARIN LEVEL (UNFRACTIONATED): Heparin Unfractionated: 0.41 IU/mL (ref 0.30–0.70)

## 2010-10-18 LAB — MRSA PCR SCREENING: MRSA by PCR: NEGATIVE

## 2010-10-18 LAB — POCT I-STAT 4, (NA,K, GLUC, HGB,HCT)
HCT: 41 % (ref 39.0–52.0)
Hemoglobin: 13.9 g/dL (ref 13.0–17.0)
Potassium: 5.4 mEq/L — ABNORMAL HIGH (ref 3.5–5.1)
Sodium: 138 mEq/L (ref 135–145)

## 2010-10-18 LAB — CARDIAC PANEL(CRET KIN+CKTOT+MB+TROPI)
CK, MB: 3.1 ng/mL (ref 0.3–4.0)
Relative Index: 3.1 — ABNORMAL HIGH (ref 0.0–2.5)
Relative Index: INVALID (ref 0.0–2.5)
Total CK: 101 U/L (ref 7–232)

## 2010-12-04 ENCOUNTER — Other Ambulatory Visit: Payer: Self-pay | Admitting: Gastroenterology

## 2010-12-19 NOTE — Op Note (Signed)
Evan [name], Evan [name]                ACCOUNT NO.:  [id number]   MEDICAL RECORD NO.:  [id number]          PATIENT TYPE:  AMB   LOCATION:  [facility]                         FACILITY:  [facility]   PHYSICIAN:  Lindaann Slough, M.D.  DATE OF BIRTH:  01-16-1932   DATE OF PROCEDURE:  07/06/2008  DATE OF DISCHARGE:                               OPERATIVE REPORT   PREOPERATIVE DIAGNOSES:  Urinary retention, adenocarcinoma of the  prostate.   POSTOPERATIVE DIAGNOSES:  Urinary retention, adenocarcinoma of the  prostate.   PROCEDURE DONE:  Cystoscopy, transurethral resection of prostate .   SURGEON:  Danae Chen, M.D.   ANESTHESIA:  General.   INDICATIONS:  The patient is a 77 year old male who had seeds in  November of 2006 for adenocarcinoma of the prostate.  His PSA has been  undetectable.  About a year ago he had severe symptoms of prostatitis  and a CT scan showed findings suggestive of prostatic abscess.  He was  treated with IV antibiotics and improved.  He had a TURP in May of this  year.  He had an SP tube placed after the TURP.  Initially he was  voiding on his own and was emptying his bladder and the SP tube was  removed, but later on he started having difficulty voiding and was not  emptying his bladder.  He was carrying 300-400 mL of residual urine.  He  was then advised to have a second TURP in an attempt to open up the  bladder neck.  The risks of the procedure were discussed with him, his  wife and his daughter.  Those risks include, but are not limited to  hemorrhage, infection, sepsis, urinary incontinence.  They all  understand and are agreeable.   The patient was identified by his wristband and proper time-out was  taken.   Under general anesthesia he was placed in the dorsal lithotomy position.  A panendoscope was inserted under direct vision in the urethra.  The  anterior urethra was normal.  There was some obstruction at the level of  the verumontanum and the cystoscope  could not be passed in the bladder.  I passed the guidewire through the cystoscope into the bladder and I  then dilated the urethra up to #30-French with [facility] dilator.  Then  [facility] dilator was then removed.  The cystoscope was then reinserted in  the bladder without difficulty.  There is a prostatic cavity on the left  lobe of the prostate compatible with a history of prostatic abscess.  The opening of the cavity is at the level of the verumontanum.  The  bladder was trabeculated.  There was no stone or tumor in the bladder.  The ureteral orifices are in normal position and shape.  Then the  cystoscope was removed and a #28 gyrus resectoscope was inserted in the  bladder.  Unroofing of the prostatic cavity on the left side was done.  Then resection of the prostatic tissues were done between the 7 and the  10 o'clock position and between the 2 and the 5 o'clock positions using  the bladder neck and the verumontanum as landmarks.  Then the resection  was completed between the 10 and the 12 o'clock positions and between  the 5 and 7 o'clock positions.  Hemostasis was secured with  electrocautery.  Then the prostatic chips were irrigated out of the bladder.  There was  no evidence of hemorrhage at the end of the procedure.  The resectoscope  was then removed.   The patient tolerated the procedure well and left the OR in satisfactory  condition to post anesthesia care unit.      Lindaann Slough, M.D.  Electronically Signed     MN/MEDQ  D:  07/06/2008  T:  07/06/2008  Job:  [id number]   cc:   Demetria Pore. Coral Spikes, M.D.  Fax: [phone]   Margaretmary Bayley, M.D.  Fax: [phone]   Wilber Bihari. Caryn Section, M.D.  Fax: [phone]

## 2010-12-19 NOTE — Op Note (Signed)
NAMERONDALL, RADIGAN                ACCOUNT NO.:  [id number]   MEDICAL RECORD NO.:  [id number]          PATIENT TYPE:  INP   LOCATION:  1440                         FACILITY:  [facility]   PHYSICIAN:  James L. Malon Kindle., M.D.DATE OF BIRTH:  1932-07-30   DATE OF PROCEDURE:  07/09/2007  DATE OF DISCHARGE:                               OPERATIVE REPORT   PROCEDURE:  Flexible sigmoidoscopy   MEDICATIONS:  Fentanyl 50 mcg, Versed 5 mg IV.   INDICATIONS:  The patient has prostate cancer.  Has had constipation and  developed pain, has been unable to go the bathroom over a week or so.  CT scan suggested infection of the prostate with some bubbles in the  prostate.  He has known prostate cancer and has had seeds.  There was  also question of retroperitoneal inflammation proctitis.  The procedure  is done to make certain he does not have some sort of infectious  prostatitis.   DESCRIPTION OF PROCEDURE:  Procedure explained to the patient and  consent obtained.  We did prep him with tap water enemas.  He has had  previous colonoscopies in the past.  The Pentax scope was inserted and  advanced.  The prep was excellent.  We able to easily advance to 60 cm.  There was no gross colitis, very little retained stool, etc.  We  withdrew back down the rectum.  The rectum was carefully examined, and  the mucosa was entirely normal.  No signs of proctitis.  The prostate  was mounded up and quite enlarged and visible.  I was unable to  retroflex due to discomfort.  I removed the scope and palpated the  prostate.  It was somewhat uncomfortable.  No other abnormalities were  seen.   ASSESSMENT:  1. Abnormal CT scan suggesting possible proctitis, but this is not      confirmed by endoscopy.  It appears that the prostate's enlargement      may be the source of the retroperitoneal inflammation.  2. Enlarged prostate, probably due to cancer.   PLAN:  Further treatment per Dr. Brunilda Payor will be available as  needed.           ______________________________  Llana Aliment Malon Kindle., M.D.     Waldron Session  D:  07/09/2007  T:  07/09/2007  Job:  [id number]   cc:   Lindaann Slough, M.D.  Fax: [phone]   Margaretmary Bayley, M.D.  Fax: [phone]

## 2010-12-19 NOTE — Discharge Summary (Signed)
NAMEADVIK, Evan [name]                ACCOUNT NO.:  [id number]   MEDICAL RECORD NO.:  [id number]          PATIENT TYPE:  INP   LOCATION:  1420                         FACILITY:  [facility]   PHYSICIAN:  Lindaann Slough, M.D.  DATE OF BIRTH:  27-May-1932   DATE OF ADMISSION:  12/19/2007  DATE OF DISCHARGE:  12/20/2007                               DISCHARGE SUMMARY   DISCHARGE DIAGNOSIS:  1. Urinary retention.  2. Adenocarcinoma of prostate   PROCEDURE:  Cystoscopy TURP and TUIP on Dec 19, 2007   This patient is a 76 year old of male who had seeds in November 2006 for  adenocarcinoma of prostate.  Postoperatively he had been having  difficulty voiding and had an SP tube in place for several months.  After removing the SP tube.  He was voiding on his own until about  November 2008 when he started having pelvic pain.  A CT scan showed  possible prostatic abscess.  He was treated with antibiotics and he  improved.  However, he continued to have difficulty voiding.  It was  then discussed with the patient about doing a TUIP to see if we would be  able to remove the SP tube and he agreed to have it done.  He had a  cystoscopy and TUIP on Dec 19, 2007.   LABS:  His hemoglobin on admission was 12.3, hematocrit 37.1, WBC 8.6,  BUN 58, creatinine 3.2 potassium 5.2.  Urine culture showed multiple  species.  Chest x-ray showed normal sinus rhythm.  Postoperatively he  did well, did not have any fever and he was discharged home on Dec 20, 2007 on Cipro 500 mg twice a day and all of his home medications.  He  was instructed on how to use the SP tube.  The plan is to have the SP  tube clamped and  see if he will be able to void, and if he is able to  void we will remove the SP tube as an outpatient.   He was then discharged home on Dec 20, 2007 on 1800 calories diabetic  diet.  He is instructed not to do any lifting, straining until further  advised.   CONDITION ON DISCHARGE:  Improved.  He will be  followed in the office.      Lindaann Slough, M.D.  Electronically Signed     MN/MEDQ  D:  01/09/2008  T:  01/09/2008  Job:  [id number]

## 2010-12-19 NOTE — Procedures (Signed)
CEPHALIC VEIN MAPPING   INDICATION:  MAP, left upper extremity cephalic vein, for possible  fistula placement.   HISTORY:  End-stage renal disease.   EXAM:   The right cephalic vein is not evaluated.   The left cephalic vein is compressible.   Diameter measurements range from 0.19 cm to 0.25 cm in the forearm.  The  left upper arm measured 0.31 cm to 0.33 cm.   See attached worksheet for all measurements.   IMPRESSION:  Patent left cephalic vein which is of acceptable diameter  for use as a dialysis access site in the upper arm.   ___________________________________________  Di Kindle. Edilia Bo, M.D.   DP/MEDQ  D:  09/23/2007  T:  09/24/2007  Job:  [id number]

## 2010-12-19 NOTE — Consult Note (Signed)
NAMEWINFORD, [name]                ACCOUNT NO.:  [id number]   MEDICAL RECORD NO.:  [id number]          PATIENT TYPE:  INP   LOCATION:  1440                         FACILITY:  [facility]   PHYSICIAN:  Evan [name]., M.D.DATE OF BIRTH:  11/30/1931   DATE OF CONSULTATION:  07/08/2007  DATE OF DISCHARGE:                                 CONSULTATION   GASTROENTEROLOGY CONSULTATION NOTE:  [name] is a 77 year old gentleman who was admitted on July 03, 2007.  He had adenocarcinoma of the prostate and has had seed implants  in November and December 2006.  He apparently did okay following this.  Prior to this, he has been seen by Dr. Leary [name] and has had  colonoscopies prior to this due to history of polyps.  The last time he  had the colonoscopy was prior to his seed implant and he had a small  rectal hyperplastic polyp.  He had been having normal bowel movements up  until about a week prior to admission when he began to become  constipated and having abdominal pain.  He has not seen any bleeding.  He had difficulty voiding and was given some Macrobid and Flomax and  Rocephin.  He had a catheter put in.  He developed abdominal pain and  constipation.  Apparently, this was felt initially to be due to the pain  medications he was having.  His white count was elevated, as well.  He  was continuing with the pain.  He ended up being admitted.  A CT scan of  the abdomen and pelvis was obtained on July 03, 2007 and showed a  decompressed bladder with a Foley catheter.  There were air bubbles  within the prostate with some concern for possible prostatic abscess.  The patient has been placed on antibiotics.  The CT was repeated 4 days  later.  He had a diffusely thickened bladder and retroperitoneal edema  and what was felt to be proctitis.  There were increased lymph nodes.  Air gas bubbles were still seen within the prostate.  The patient notes  his pain is better.  He is still having  constipation.  There was felt to  be a thickened rectum consistent with proctitis.  The patient's white  count was 27 on admission and has dropped down to 23.  He is eating and  feeling reasonably well.  Due to concern over fistulous proctitis, we  were asked to see him.   MEDICATIONS:  Medications on admission included:  1. Coreg.  2. Avapro.  3. Lipitor.  4. Zetia.  5. Amaryl.  6. Glyburide.  In addition to these, here in the hospital the patient is on:  1. Colace.  2. Protonix.  3. Sliding scale insulin.  4. Megace.  5. Unasyn.  6. Senna.  7. Thorazine.  8. Vicodin.   ALLERGIES:  He has no drug allergies.   MEDICAL HISTORY:  1. A colonoscopy with adenomatous polyps.  2. He has also had a previous history of ulcer disease.  3. He has diabetes, followed by Dr. Margaretmary [name].  4.  Hypertension.  5. Hyperlipidemia.  6. Apparently has had, according to him, no further problems with      ulcers, although he was admitted with a chronic duodenal ulcer in      2004 and was positive for H. pylori.  7. He does have a history of renal stones, as well.  8. Endoscopies have shown some gastric polyps, duodenal ulcer etc.   FAMILY HISTORY:  Noncontributory.   SOCIAL HISTORY:  He is married.  Has a daughter.  Does not smoke or  drink.   REVIEW OF SYSTEMS:  Remarkable for lack of constipation, lack of  dyspepsia, reflux or any upper abdominal pain.  Up until a week prior to  admission, he has had no problems with constipation.  He has no  dysphagia, no known gallbladder disease.  Denies any hepatitis or any  other symptoms.   PHYSICAL EXAMINATION:  VITAL SIGNS:  Temperature 97.5,  pulse 88, blood  pressure 168/75, O2 saturation 99% on room air.  GENERAL:  An alert African-American male who was nonicteric and in no  acute distress.  LUNGS:  Clear.  HEART:  Regular rate and rhythm without murmurs or gallops.  ABDOMEN:  Soft and nontender with good bowel sounds.   ASSESSMENT:   Possible proctitis versus prostatic abscess.  This is all  unclear.  I cannot really tell much of anything except thickening on the  CT scan.   PLAN:  I think an enema would be helpful to try to determine what is  going on.  Will try to give him a gentle enema and set him up for a  sigmoidoscopy tomorrow.           ______________________________  Evan [name]., M.D.     Waldron Session  D:  07/08/2007  T:  07/09/2007  Job:  [id number]   cc:   Evan [name], M.D.  Fax: [phone]   Evan [name], M.D.  Fax: [phone]   Evan [name], M.D.  Fax: [phone]

## 2010-12-19 NOTE — Op Note (Signed)
NAMEKEON, Evan [name]                ACCOUNT NO.:  [id number]   MEDICAL RECORD NO.:  [id number]          PATIENT TYPE:  AMB   LOCATION:  SDS                          FACILITY:  [facility]   PHYSICIAN:  Di Kindle. Edilia Bo, M.D.DATE OF BIRTH:  02-17-1932   DATE OF PROCEDURE:  04/14/2008  DATE OF DISCHARGE:  04/14/2008                               OPERATIVE REPORT   PREOPERATIVE DIAGNOSIS:  End-stage renal disease.   POSTOPERATIVE DIAGNOSIS:  End-stage renal disease.   PROCEDURE:  Placement of a left arteriovenous fistula.   SURGEON:  Di Kindle. Edilia Bo, MD   ASSISTANT:  Jess Barters, RNFA   ANESTHESIA:  Local with sedation.   TECHNIQUE:  The patient was taken to the operating room and sedated by  Anesthesia.  The left upper extremity was prepped and draped in the  usual sterile fashion.  After the skin was infiltrated with 1%  lidocaine, a transverse incision was made just above the antecubital  level.  The cephalic vein was dissected free.  It was ligated distally  and irrigated up with heparinized saline and did take a 5-mm dilator.  The artery was dissected free beneath the fascia.  I interrogated with  the Doppler and it turns out the patient had a high bifurcation of  brachial artery.  The ulnar artery was very reasonable in size, however,  so I elected to do an ulnar-cephalic fistula.  The patient was  heparinized.  The ulnar artery was clamped proximally and distally and a  longitudinal arteriotomy was made.  The vein was mobilized over and sewn  end-to-side to the artery using continuous 6-0 Prolene suture.  At the  completion, there was an excellent thrill in the fistula.  Hemostasis  was obtained in the wound and the wound was closed with a deep layer of  3-0 Vicryl.  The skin closed with 4-0 Vicryl.  A sterile dressing was  applied.  The patient tolerated the procedure well and was transferred  to the recovery room in satisfactory condition.  All needle and sponge  counts were correct.      Di Kindle. Edilia Bo, M.D.  Electronically Signed     CSD/MEDQ  D:  04/14/2008  T:  04/15/2008  Job:  [id number]

## 2010-12-19 NOTE — H&P (Signed)
Evan [name], Evan [name]                ACCOUNT NO.:  [id number]   MEDICAL RECORD NO.:  [id number]          PATIENT TYPE:  INP   LOCATION:  0101                         FACILITY:  [facility]   PHYSICIAN:  Lindaann Slough, M.D.  DATE OF BIRTH:  [birth year]   DATE OF ADMISSION:  07/03/2007  DATE OF DISCHARGE:                              HISTORY & PHYSICAL   CHIEF COMPLAINT:  Abdominal pain and constipation and history of  prostate cancer.   The patient is a 77 year old male who was treated with SEEDS in November  and December 2006 for adenocarcinoma of the prostate, Gleason score six.  Postoperatively he went into urinary retention and had a Foley catheter  that was subsequently replaced with a suprapubic catheter.  Eventually  the suprapubic catheter was removed because he started voiding on his  own and his residual was minimal.  About a week ago he started having  difficulty voiding.  Postvoid residual at the office was 96 cc.  He  received an injection of Rocephin and was sent home on Macrobid.  He was  asked to take to Flomax at night.  However he continued to complain of  difficulty voiding and about five days ago a Foley catheter was inserted  in the bladder that drained 250 cc of urine.  The catheter was left  indwelling.  He continued to complain of abdominal pain and constipation  and he came to the emergency room this afternoon.  A CT scan showed  findings to be suggestive of a prostatic abscess.  His white count was  also elevated at 38.5.  The patient is now admitted for further  treatment.   PAST MEDICAL HISTORY:  Positive for diabetes, hypertension and  hyperlipidemia.  He has a past history of GI bleeding in January 13, 2003  and he has secondary hyperparathyroidism.   PAST SURGICAL HISTORY:  Positive for SEEDS implantation.   MEDICATIONS:  1. Coreg 12.5 mg daily.  2. Avapro 300 mg.  3. Lipitor 80 mg.  4. Zetia 10 mg.  5. Amaryl 4 mg.  6. Glimepiride 4 mg a day.   ALLERGIES:  NO KNOWN DRUG ALLERGIES.   SOCIAL HISTORY:  He is married and has one daughter.  He does not smoke  nor drink   REVIEW OF SYSTEMS:  Positive for constipation, abdominal pain and  fatigue.  He has a Foley catheter for urinary retention and everything  else is negative.   PHYSICAL EXAMINATION:  GENERAL:  This is a well-developed 77 year old  male who is complaining of abdominal pain and constipation with  difficulty voiding.  He is alert and oriented to time, place and person.  VITAL SIGNS:  Blood pressure is 132/80, pulse 88, respiration 18,  temperature 98.2.  HEAD:  Normal.  EYES:  He has pink conjunctivae.  EARS, NOSE AND THROAT:  Within normal limits.  NECK:  Supple.  No cervical adenopathy.  No thyromegaly.  CHEST:  Symmetrical.  Lungs are fully expanded and clear to percussion  and auscultation.  HEART:  Regular rhythm.  ABDOMEN:  Soft, nondistended, nontender.  He has no  CVA tenderness.  Liver, spleen and kidneys are not palpable.  Bladder is not distended.  He has a Foley catheter that is draining clear urine.  Scrotum is normal  in appearance.  Both testicles feel normal.  The left epididymis is firm  and nontender.  On rectal examination sphincter tone is normal.  Prostate is enlarged, firm and nontender.  There is no evidence of  rectal mass.  There are brown stools in the ampulla.   LABORATORY DATA:  Hemoglobin is 13.2, hematocrit 38.7, WBC 38.5.  BUN  45, creatinine 3.11, potassium 5.7, sodium 137.  Urinalysis shows 7-10  WBCs, 3-6 RBCs, rare bacteria, more than 300 mg of protein,  0.2 mg of  urobilinogen and nitrite negative.   IMPRESSION:  1. Possible prostatic abscess.  2. Urinary tract infection.  3. Diabetes.  4. Hypertension.  5. Renal insufficiency.  6. Hyperlipidemia.   PLAN:  Admit for IV antibiotics.      Lindaann Slough, M.D.  Electronically Signed     MN/MEDQ  D:  07/03/2007  T:  07/04/2007  Job:  [id number]

## 2010-12-19 NOTE — H&P (Signed)
NAMELUCIUS, WISE                ACCOUNT NO.:  [id number]   MEDICAL RECORD NO.:  [id number]          PATIENT TYPE:  INP   LOCATION:  1420                         FACILITY:  [facility]   PHYSICIAN:  Lindaann Slough, M.D.  DATE OF BIRTH:  Nov 11, 1931   DATE OF ADMISSION:  12/19/2007  DATE OF DISCHARGE:                              HISTORY & PHYSICAL   CHIEF COMPLAINT:  Inability to urinate and prostate cancer.   The patient is 77 year old male who had seeds in November and December  2006 for adenocarcinoma of prostate, Gleason score 6.  His PSA has been  undetectable.  However, he has been unable to urinate on his own.  A  suprapubic catheter was then inserted.  He was admitted in November 2008  for acute and chronic prostatitis.  I have discussed with patient  regarding a TURP.  I explained the risk of urinary incontinence and also  the possibility that he might still not be able to void on his own.  He  would like to have the TURP.  He is admitted today for the procedure.  A  CT scan in November 2008 showed findings suggestive of prostatic  abscess.  However, because of concern about  fistula, a GI consult was  obtained and there was no evidence of prostatorectal fistula.   PAST MEDICAL HISTORY:  Positive for:  1. Diabetes.  2. Hypertension.  3. Hyperlipidemia.  4. Renal insufficiency.  5. GI bleeding in June 2009.  6. History history of a kidney stone.   PAST SURGICAL HISTORY:  He had seeds implantation for prostate cancer.   MEDICATIONS:  1. Flomax 2 capsules at night.  2. Avodart 0.5 mg.  3. Glimepiride 4 mg twice a day.  4. Lantus insulin.  5. Carvedilol 12.5 mg half a tablet in the morning, half tablet in the      evening.  6. Azor 5/40 one daily.   ALLERGIES:  NO KNOWN DRUG ALLERGIES.   SOCIAL HISTORY:  He is married, has 1 daughter, does not smoke nor  drink.   FAMILY HISTORY:  His father and mother are deceased.  His mother died of  cancer at the age 67 and his  father was killed at age 22.   REVIEW OF SYSTEMS:  As noted in the HPI.  He complains of fatigue,  constipation at times, and everything else is negative.   PHYSICAL EXAMINATION:  This is a well-developed 77 year old male who is  in no acute distress at this time.  He is alert and oriented to time,  place, and person.  Blood pressure is 181/81.  Pulse 78.  Respirations  16.  Temperature 97.1.  HEAD:  Normal.  He has pink conjunctivae.  EARS, NOSE AND THROAT:  Within normal limits.  NECK:  Supple.  No cervical adenopathy.  No thyromegaly.  CHEST:  Symmetrical.  LUNGS:  Fully expanded and clear to percussion and auscultation.  HEART:  Regular rhythm.  ABDOMEN:  Soft, nondistended, nontender.  He has no CVA tenderness.  Kidneys are not palpable.  There is no hepatomegaly.  No splenomegaly.  He has an SP tube that is draining clear urine.  Bowel sounds are  normal.  GENITALIA:  Penis is circumcised.  The meatus is normal.  Scrotum is  normal.  He has no testicular mass.  Cords and epididymis are within  normal limits.  RECTAL EXAMINATION:  Sphincter tone is normal.  Prostate is flat, firm,  nontender   IMPRESSION:  1. Urinary retention.  2. History of carcinoma of the prostate.  3. Diabetes.  4. Hypertension  5. Renal insufficiency.  6. Secondary hyperparathyroidism.        Lindaann Slough, M.D.  Electronically Signed     MN/MEDQ  D:  12/19/2007  T:  12/19/2007  Job:  [id number]

## 2010-12-19 NOTE — Procedures (Signed)
CAROTID DUPLEX EXAM   INDICATION:  Bilateral bruits.   HISTORY:  Diabetes:  Yes.  Cardiac:  No.  Hypertension:  Yes.  Smoking:  Previous.  Previous Surgery:  No.  CV History:  No.  Amaurosis Fugax No, Paresthesias No, Hemiparesis No.                                       RIGHT             LEFT  Brachial systolic pressure:         158               Fistula  Brachial Doppler waveforms:         WNL  Vertebral direction of flow:        Antegrade         Antegrade  DUPLEX VELOCITIES (cm/sec)  CCA peak systolic                   120               123  ECA peak systolic                   101               145  ICA peak systolic                   106               97  ICA end diastolic                   22                13  PLAQUE MORPHOLOGY:                  Heterogenous      Heterogenous  PLAQUE AMOUNT:                      Mild              Mild  PLAQUE LOCATION:                    ICA               ICA   IMPRESSION:  1. Bilateral internal carotid arteries suggest 1% to 39% stenosis.  2. Antegrade flow in bilateral vertebrals.        ___________________________________________  Di Kindle. Edilia Bo, M.D.   CB/MEDQ  D:  01/12/2010  T:  01/12/2010  Job:  [id number]

## 2010-12-19 NOTE — Discharge Summary (Signed)
Evan [name], Evan [name]                ACCOUNT NO.:  [id number]   MEDICAL RECORD NO.:  [id number]          PATIENT TYPE:  INP   LOCATION:  1440                         FACILITY:  [facility]   PHYSICIAN:  Lindaann Slough, M.D.  DATE OF BIRTH:  Mar 31, 1932   DATE OF ADMISSION:  07/03/2007  DATE OF DISCHARGE:  07/11/2007                               DISCHARGE SUMMARY   DISCHARGE DIAGNOSES:  1. Chronic prostatitis.  2. Adenocarcinoma of prostate.  3. Urinary retention.  4. Proctitis.   CONSULTATIONS:  1. Dr. Carman Ching.  2. Dr. Margaretmary Bayley.   PROCEDURES:  1. Sigmoidoscopy by Dr. Randa Evens.  2. Cystoscopy.  3. Suprapubic catheter.   HISTORY:  The patient is a 77 year old male who had seeds for  adenocarcinoma of the prostate in November and December 2006.  He had a  suprapubic catheter for urinary retention postop.  The catheter was  removed about 10 months ago.  He was doing well until about a week prior  to this admission when he started having difficulty voiding.  He was  taking 2 Flomax at night and he continued to have difficulty voiding.  A  Foley catheter was then inserted in the bladder and left indwelling.  He  was home on Macrobid b.i.d., however, he continued to have pelvic pain.  He had a poor appetite.  He came to the emergency room on July 03, 2007.  CT scan showed findings suggestive of a prostatic abscess.  His  white count was elevated at 38.5.  He was admitted for IV antibiotics  and possible unroofing of the prostatic abscess.   PHYSICAL EXAMINATION:  VITAL SIGNS:  Blood pressure on admission was  132/80, pulse 88, respiration 18, temperature 98.2.  LUNGS:  Clear.  HEART:  Regular rhythm.  ABDOMEN:  Soft, nondistended, nontender.  Liver, spleen and kidneys were  not palpable.  Bladder not distended.  He had a Foley catheter that was  draining clear urine.  RECTAL:  Sphincter tone was normal.  Prostate was enlarged, firm,  nontender and there was no evidence  of rectal mass.   HOSPITAL COURSE:  The patient was treated with IV Unasyn.  His white  count gradually went down.  On July 10, 2007, it was down to 20,000.  Because of the findings on CT scan, a GI consultation was requested with  Dr. Randa Evens who found no evidence of rectal fistula, no evidence of  abnormal rectal mucosa on sigmoidoscopy.  A CT cystogram showed some air  bubble within the substance of the prostate and some inflammation in the  retroperitoneum and the iliac regions, and a thick walled bladder.  The  patient continued to improve.  He did not have any more abdominal pain.  The Foley catheter was removed on July 10, 2007.  He was unable to  urinate and the catheter was replaced.  On July 11, 2007, he had a  cystoscopy and insertion of a suprapubic tube.  I had discussed with the  patient and his wife about doing any procedure to relieve the outlet  obstruction.  But I  have told them that a formal TURP would have a high-  risk of incontinence and he felt better with having the suprapubic  catheter in place.  On July 11, 2007, he was eating well.  The  suprapubic catheter was draining clear urine.  He was then discharged  home.   DISCHARGE MEDICATIONS:  1. Cipro 500 mg twice a day.  2. MiraLax 17 grams in clear liquids daily.  3. All his home medications that include Protonix 40 mg a day.  4. Avapro 300 mg a day.  5. Amaryl 4 mg a day.  6. Zocor  20 mg a day.  7. Coreg 12.5 mg a day.  8. Ferrous sulfate 325 gm a day.  9. Norvasc 10 mg a day.  10.Megace 20 mg a day.  11.Vicodin 1-2 tablets q.4 h. p.r.n. for pain.  12.NovoLog 15 units for blood glucose of 100-150; 22 units for blood      glucose of 151-200; 30 units for blood sugar over 200.   DISCHARGE DIET:  An 1800 calorie ADA diet.   CONDITION ON DISCHARGE:  Improved.   DISCHARGE INSTRUCTIONS:  1. The patient is instructed not to do any lifting or straining.  2. He is to get out of bed and walk.   3. He will be followed by myself, Dr. Chestine Spore, Dr. Coral Spikes, Dr. Randa Evens      and Dr. Caryn Section at  [facility].      Lindaann Slough, M.D.  Electronically Signed     MN/MEDQ  D:  07/11/2007  T:  07/12/2007  Job:  [id number]   cc:   Margaretmary Bayley, M.D.  Fax: [phone]   Wilber Bihari. Caryn Section, M.D.  Fax: [phone]   Demetria Pore. Coral Spikes, M.D.  Fax: [phone]   Llana Aliment. Malon Kindle., M.D.  Fax: [phone]

## 2010-12-19 NOTE — Op Note (Signed)
NAMEKRISTOF, Evan [name]                ACCOUNT NO.:  [id number]   MEDICAL RECORD NO.:  [id number]          PATIENT TYPE:  INP   LOCATION:  1440                         FACILITY:  [facility]   PHYSICIAN:  Lindaann Slough, M.D.  DATE OF BIRTH:  11-24-1931   DATE OF PROCEDURE:  07/11/2007  DATE OF DISCHARGE:                               OPERATIVE REPORT   PREOPERATIVE DIAGNOSIS:  1. Urinary retention.  2. Adenocarcinoma of prostate.   POSTOPERATIVE DIAGNOSIS:  1. Urinary retention.  2. Adenocarcinoma of prostate.   PROCEDURE:  Cystoscopy and insertion of suprapubic catheter.   SURGEON:  Dr. Brunilda Payor.   ANESTHESIA:  General.   INDICATION:  The patient is a 77 year old male who had brachytherapy in  November and December 2006.  Postoperatively he went into urinary  retention and had a suprapubic catheter for about a year.  He eventually  started to void on his own and the suprapubic catheter was removed.  About 2 weeks ago he started having difficulty voiding again.  A Foley  catheter was then reinserted in the bladder.  He continued to complain  of suprapubic pain.  A CT scan showed a questionable prostatic abscess  The patient was treated with IV Unasyn. Urine culture was negative.  Consultation with Dr. Carman Ching was obtained and there was no  evidence of  proctitis. The patient had improved.  The Foley catheter  was removed on  July 10, 2007. He was unable to void after that.  The  Foley catheter was then reinserted and he is scheduled today for  cystoscopy and insertion of suprapubic catheter.   Under general anesthesia, the patient was prepped and draped and placed  in the dorsal lithotomy position.  The flexible cystoscope was inserted  in the bladder.  The anterior urethra is normal. He has moderate  prostatic hypertrophy with obstruction of the bladder neck.  The bladder  is trabeculated.  There is no stone or tumor in the bladder.  The  ureteral orifices are in normal  position and shape.  A skin incision was  made in the suprapubic area and a suprapubic catheter introducer was  passed under direct vision in the bladder.  A #16 Foley catheter was  then passed through the peel-away sheath and the Foley catheter was  introduced in the bladder.  The peel-away sheath was then removed.  The  balloon of the Foley catheter was then inflated with 10 mL of water. The  catheter was in good position in the bladder.  The cystoscope was then  removed.   Rectal examination was then done. The prostate is enlarged but there is  no evidence of prostatic nodule.   The patient tolerated the procedure well and left the OR in satisfactory  condition to post anesthesia care unit.      Lindaann Slough, M.D.  Electronically Signed     MN/MEDQ  D:  07/11/2007  T:  07/11/2007  Job:  [id number]   cc:   Margaretmary Bayley, M.D.  Fax: [phone]   Demetria Pore. Coral Spikes, M.D.  Fax: [phone]   Llana Aliment. Randa Evens  Montez Hageman., M.D.  Fax: [phone]   Petra Kuba, M.D.  Fax: [phone]

## 2010-12-19 NOTE — Consult Note (Signed)
NEW PATIENT CONSULTATION   Evan [name]  DOB:  [birth year]                                       09/23/2007  UJWJX#:[id number]   I saw the patient in the office today in consultation for hemodialysis  access.  He was referred by Dr. Caryn [name].  This is a pleasant 77 year old  right-handed gentleman with a long history of chronic renal  insufficiency followed by Dr. Caryn [name].  His chronic renal failure is  secondary to diabetes.  He has not yet begun dialysis.  He has had  gradual deterioration in his renal function, and it is felt that he will  need access in the next year.   His past medical history is significant for diabetes complicated by  neuropathy and retinopathy.  In addition, he has hypertension.  Hyperlipidemia.  History of prostate cancer, status post seed implants  in November, 2006.  History of secondary hyperparathyroidism.  Of note,  he has recently had problems with bladder outlet obstruction and has a  suprapubic catheter which is followed by Dr. Brunilda [name] and has been changed  several times.  He has been treated for a urinary tract infection and  just completed a course of antibiotics.   SOCIAL HISTORY:  He is married.  He had one child.  He quit tobacco in  1969.   Review of systems and medications are documented on the medical history  form in his chart.   PHYSICAL EXAMINATION:  This is a pleasant 77 year old gentleman who  appears his stated age.  Blood pressure is 195/89, heart rate 88.  Lungs  are clear bilaterally to auscultation.  On cardiac exam, he has a  regular rate and rhythm.  He has palpable brachial and radial pulse  bilaterally.  I did not see a usable formed cephalic vein on either  side.   Vein map in our office today shows the formed cephalic vein is quite  small and also has a large competing branch.  The upper arm cephalic  vein looks reasonable in size.   I have recommended that we explore his upper arm cephalic vein and  potentially place an upper arm fistula.  If this is not adequate, then  we will replace an AV graft; however, I am reluctant to proceed, given  that he is currently being treated for a urinary tract infection and has  a suprapubic catheter.  If we do have to place an AV graft, he would be  high risk for graft infection if this infection has not yet cleared with  his suprapubic catheter still in place.  He will let us know when he is  willing to proceed with surgery once the infection is cleared.  I have  discussed the indications for surgery and the potential complications,  including but not limited to, failure of the fistula to mature, graft  thrombosis, graft infection, steal syndrome.  All of his questions were  answered, and he is agreeable to proceed once his urinary tract  infection is cleared.   Evan [name], M.D.  Electronically Signed   CSD/MEDQ  D:  09/23/2007  T:  09/25/2007  Job:  [id number]   cc:   Evan [name]. Evan [name], M.D.

## 2010-12-19 NOTE — Assessment & Plan Note (Signed)
OFFICE VISIT   JAKYRI, BRUNKHORST  DOB:  [birth year]                                       05/25/2008  JWJXB#:[id number]   I saw the patient in the office today for followup after placement of a  left upper arm AV fistula 04/14/2008.  He returns for a 6 week followup  visit.  He has had no specific complaints referable to his surgery.  He  has had no significant swelling or pain.   PHYSICAL EXAMINATION:  On physical examination blood pressure is 160/73,  heart rate is 74.  His incision has healed nicely.  He has a good thrill  in his upper arm fistula.  I have encouraged him to exercise his arm and  he is going to begin by using a 3 pound weight.  I think this fistula  should continue to mature nicely and hopefully will be ready for access  if and when he needs it.  We will see him back p.r.n.   Di Kindle. Edilia Bo, M.D.  Electronically Signed   CSD/MEDQ  D:  05/25/2008  T:  05/26/2008  Job:  [id number]   cc:   Wilber Bihari. Caryn Section, M.D.

## 2010-12-19 NOTE — Op Note (Signed)
Evan [name], Evan [name]                ACCOUNT NO.:  [id number]   MEDICAL RECORD NO.:  [id number]          PATIENT TYPE:  INP   LOCATION:  0001                         FACILITY:  [facility]   PHYSICIAN:  Lindaann Slough, M.D.  DATE OF BIRTH:  1932-02-28   DATE OF PROCEDURE:  12/19/2007  DATE OF DISCHARGE:                               OPERATIVE REPORT   PREOPERATIVE DIAGNOSIS:  Urinary retention, adenocarcinoma of prostate.   POSTOPERATIVE DIAGNOSIS:  Urinary retention, adenocarcinoma of prostate.   PROCEDURE:  Cystoscopy, channel transurethral resection of the prostate,  transurethral incision of prostate and replacement of suprapubic tube.   SURGEON:  Danae Chen, M.D.   ANESTHESIA:  General.   INDICATION:  The patient is a 77 year old male who had seeds for  adenocarcinoma of prostate in November 2006.  Postoperatively he went  into urinary retention and was treated with an indwelling Foley catheter  that was eventually replaced by an SP tube.  He has been on Flomax and  Avodart and still been unable to urinate.  In November 2008 he was  admitted for prostatitis and CT scan showed findings suggestive of  prostatic abscess.  He was then treated with IV antibiotics and he  responded to the treatment.  About 2 months after discharge he had  drained some purulent material from the meatus.  I discussed treatment  options with him and his family.  I have told him that we can leave the  SP tube in place or do a channel TURP.  He wanted to have the procedure  and see if he could get rid of suprapubic catheter.  He understands the  risk of urinary incontinence and  fistula and also to the possibility  that that would not help him urinate on his own.  He agreed to proceed.  He is scheduled today for the procedure.   Under general anesthesia, the patient was prepped and draped and placed  in the dorsolithotomy position.  A panendoscope was inserted in the  bladder.  The anterior urethra is  normal..  At the verumontanum on the  left side on the prostatic urethra, there is an opening leading to a  cavity through the prostate and that was not present at the time of the  previous cystoscopy in November.  There is no purulent collection in  that cavity.  It probably represents  the area of prostatic abscess that  had spontaneously drained in the urethra.  On cystoscopy, there is  moderate prostatic hypertrophy.  The bladder is moderately trabeculated.  There is a Foley catheter in the bladder from the suprapubic area.  The  ureteral orifices are in normal position and shape.  The cystoscope was  then removed.  The urethra was dilated up to #30-French.  Then the  resectoscope was inserted in the bladder.  Resection of the anterior  lobe of prostate was done from the bladder neck to verumontanum.  Hemostasis was secured with electrocautery.  Then a TUIP was done in  order to open the bladder neck further.  I did not want to do a formal  TURP in order not to increase risk of urinary incontinence.  At the end  of the procedure with the resectoscope at the verumontanum, the bladder  neck was opened.  Prostatic chips were then irrigated out of the  bladder.  There was minimal bleeding at the  end of the procedure.  Then a #22-French Foley catheter was inserted  without difficulty in the bladder.  Then the suprapubic catheter was  replaced with a #18-French Foley.   The patient tolerated the procedure well and left the OR in satisfactory  condition to post anesthesia care unit.      Lindaann Slough, M.D.  Electronically Signed     MN/MEDQ  D:  12/19/2007  T:  12/19/2007  Job:  [id number]   cc:   Margaretmary Bayley, M.D.  Fax: [phone]   Demetria Pore. Coral Spikes, M.D.  Fax: [phone]   Wilber Bihari. Caryn Section, M.D.  Fax: [phone]

## 2010-12-22 NOTE — Procedures (Signed)
[place]  Patient:    Evan [name], Evan [name]                      MRN: [id number] Proc. Date: 09/23/00 Attending:  Verlin Grills, M.D. CC:         Demetria Pore. Coral Spikes, M.D.  Richard F. Caryn Section, M.D.  Lindell Spar. Chestine Spore, M.D.   Procedure Report  DATE OF BIRTH:  06/13/1932  REFERRING PHYSICIAN:  PROCEDURE PERFORMED:  Colonoscopy.  ENDOSCOPIST:  Verlin Grills, M.D.  INDICATIONS FOR PROCEDURE:  The patient is a 77 year old male.  On September 30, 1995, he underwent a colonoscopy with removal of a 3 mm sessile adenomatous polyp from the rectum.  [name] is due for surveillance colonoscoyp and polypectomy to prevent colon cancer.  I discussed with the patient the complications associated with colonoscopy and polypectomy including a 1 per 1000 risk of bleeding and 4 per 1000 risk of colon rupture requiring emergenty surgery.  The patient has signed the operative permit.  PREMEDICATION:  Demerol 50 mg, Versed 5 mg.  ENDOSCOPE:  Olympus pediatric colonoscope.  DESCRIPTION OF PROCEDURE:  After obtaining informed consent, the patient was placed in the left lateral decubitus position.  I administered intravenous Demerol and intravenous Versed to achieve sedation for the procedure.  The patients blood pressure, oxygen saturation and cardiac rhythm were monitored throughout the procedure and documented in the medical record.  Anal inspection was normal.  Digital rectal examination was normal.  I was unable to reach and examine the prostate.  The Olympus pediatric video colonoscope was then introduced into the rectum and under direct vision, advanced to the proximal ascending colon.  I was never able to intubate the cecum.  With the endoscope at the level of the proximal ascending colon, I felt I got adequate visualization of the cecal mucosa by turning the patient from the left lateral decubitus position to the supine position.   Colonic preparation for the exam today was excellent.  Rectum:  Normal.  Sigmoid colon and descending colon:  Normal.  Splenic flexure:  Normal.  Transverse colon:  From the proximal-mid transverse colon, three 1-2 mm sessile polyps were removed with the electrocautery snare and submitted for pathological interpretation..  Hepatic flexure:  Normal.  Ascending colon:  Normal.  Cecum:  Normal.  ASSESSMENT: 1. From the transverse colon, three 1-2 mm sessile polyps were removed with    the electrocautery snare and submitted for pathological interpretation.  RECOMMENDATIONS:  Repeat colonoscopy in approximately five years. DD:  09/23/00 TD:  09/23/00 Job: [id number]

## 2010-12-22 NOTE — Op Note (Signed)
Evan [name], Evan [name]                ACCOUNT NO.:  [id number]   MEDICAL RECORD NO.:  [id number]          PATIENT TYPE:  AMB   LOCATION:  NESC                         FACILITY:  [facility]   PHYSICIAN:  Lindaann Slough, M.D.  DATE OF BIRTH:  Jun 01, 1932   DATE OF PROCEDURE:  07/27/2005  DATE OF DISCHARGE:  07/27/2005                                 OPERATIVE REPORT   PREOPERATIVE DIAGNOSES:  Adenocarcinoma of the prostate.   POSTOPERATIVE DIAGNOSES:  Adenocarcinoma of the prostate.   PROCEDURE:  I-125 seeds implantation and cystoscopy.   SURGEON:  Danae Chen, M.D. and Maryln Gottron, M.D.   ANESTHESIA:  General.   INDICATION:  [name] is a 77 year old male who is post seeds implantation.  Post implant dosimetry showed that the D90 was at  77.17%. Dr. Dayton Scrape felt  that this level was not acceptable. We discussed with the patient regarding  inserting more seeds to get better coverage. He is agreeable. He is  scheduled today for the procedure. Planning was done with ultrasound  guidance. Then five seeds were implanted in the cold spot. There appears to  be good coverage. Then the Foley catheter that was previously inserted in  the bladder was removed. A flexible cystoscope was then passed in the  bladder under direct vision. There is no evidence of seeds in the urethra  nor in the bladder. The bladder is moderately trabeculated. There is no  stone, tumor or seed in the bladder. The ureteral  orifices  are in normal position and shape. The cystoscope was removed. A  #16-French Foley catheter was then reinserted in the bladder.   The patient tolerated the procedure well and left the OR in satisfactory  condition to post anesthesia care unit.      Lindaann Slough, M.D.  Electronically Signed     MN/MEDQ  D:  07/27/2005  T:  07/31/2005  Job:  [id number]   cc:   Margaretmary Bayley, M.D.  Fax: [phone]   Demetria Pore. Coral Spikes, M.D.  Fax: [phone]   Wilber Bihari. Caryn Section, M.D.  Fax: [phone]   Maryln Gottron, M.D.  Fax: [phone]

## 2010-12-22 NOTE — Discharge Summary (Signed)
NAMEARTHER, HEISLER NO.:  [id number]   MEDICAL RECORD NO.:  [id number]          PATIENT TYPE:  INP   LOCATION:  1419                         FACILITY:  [facility]   PHYSICIAN:  Thora Lance, M.D.  DATE OF BIRTH:  27-Aug-1931   DATE OF ADMISSION:  05/19/2006  DATE OF DISCHARGE:  05/20/2006                                 DISCHARGE SUMMARY   [name] was admitted to observation approximately 8 hours prior to his  discharge with constipation, some mild acute on chronic renal failure and  hyperkalemia.  He was treated with IV fluids overnight and Kayexalate.  On  the morning of his discharge, his potassium had dropped to 4.8 and his  creatinine was down to 3.5.  He had a good bowel movement.  He was  discharged in good condition.  Several of his medication doses were changed.   DISCHARGE DIAGNOSES:  1. Acute on chronic renal failure.  2. Hyperkalemia.  3. Diabetes mellitus.   PROCEDURES:  None.   DISCHARGE MEDICATIONS:  He is to resume all prior medications that he was  taking prior to admissions with the following changes:  1. __________ .  2. __________ furosemide.  3. Change Januvia to 100 mg 1/2 a day.  4. Change Levaquin to 500 mg p.o. every other day for 4 more doses.  5. Stop nitrofurantoin.  6. Stop oxycodone.   Discharge to home.  Followup with Dr. Coral Spikes in several days for a recheck  of BMET.   DIET:  Low-potassium diabetic diet.   ACTIVITY:  As tolerated.           ______________________________  Thora Lance, M.D.     JJG/MEDQ  D:  05/20/2006  T:  05/20/2006  Job:  [id number]   cc:   Demetria Pore. Coral Spikes, M.D.  Fax: [phone]

## 2010-12-22 NOTE — Discharge Summary (Signed)
NAMEMARLO, [name] NO.:  [id number]   MEDICAL RECORD NO.:  [id number]          PATIENT TYPE:  INP   LOCATION:  3028                         FACILITY:  [facility]   PHYSICIAN:  Evan [name], M.D.    DATE OF BIRTH:  August 27, 1931   DATE OF ADMISSION:  09/28/2005  DATE OF DISCHARGE:  10/01/2005                                 DISCHARGE SUMMARY   DISCHARGE DIAGNOSIS:  1.  Gastroenteritis secondary to the [place] virus.  2.  Dehydration.  3.  Type 2 diabetes mellitus.  4.  Severe renal insufficiency.  5.  Hypercholesterolemia.  6.  Systemic hypertension.  7.  Carcinoma of the prostate.   REASON FOR ADMISSION:  This is one of several [place] hospitalizations  for [name], a 77 year old type 2 diabetic hypertensive gentleman with  renal insufficiency who was admitted with a 2-3 day history of progressive  nausea, vomiting, diarrhea, prostration and presyncope.  The patient states  that despite changing his diet to liquids and taking some symptomatic  medications prescribed by his medical physician, he continued to note  increasing blood sugar levels, along with weakness and dizziness.   PERTINENT PHYSICAL FINDINGS ON EXAM:  GENERAL:  The patient is a well-  developed, well-nourished African-American gentleman appearing somewhat  prostrate but in no acute distress.  VITAL SIGNS:  Temperature was 98.2, blood pressure lying 128/74 and sitting  with his legs dangling 105/60, pulse rate of 88 lying, and 100 sitting with  his legs dangling, respiratory of 20.  HEENT:  Extraocular movements are full.  He had no nystagmus.  Pharynx was  clear.  NECK:  Supple.  No adenopathy.  CHEST:  He had no splinting.  LUNGS:  Clear to percussion and auscultation.  CARDIAC:  He had a regular rhythm.  No murmurs, rubs, or gallops.  ABDOMEN:  He had mild to moderate periumbilical and epigastric tenderness  without any rebound.  His bowel sounds were increased.  He had no obvious  masses or organomegaly.   PERTINENT PATHOLOGY AND X-RAY DATA:  His admission white count was 6600,  hematocrit 30, hemoglobin of 10.2, glucose 188, creatinine of 2.5, BUN of  51, cholesterol 152.  His examinations were normal.  TSH was 3.7 with a free  T4 of 1.01 being normal.  An acute hepatitis panel was completely normal.   HOSPITAL COURSE:  The patient was admitted and started on parenteral fluid  replacement with half normal saline at 125 cc per hour.  He was continued on  his home medications, which included __________ 4 mg per day, Lipitor 80 per  day, Zetia 10 mg daily, Clarinex 5 mg per day, Flomax 0.8 mg per day,  Prevacid 30 per day, Coreg 3.125 mg b.i.d.  In addition, Zofran was used at  4 mg every 4 hours p.r.n. for nausea and vomiting.  He was given Lomotil 2  initially on his admission, and one after each of his bowel movements.  He  was started on Lantus at 10 units subcutaneous each morning, and a  fractional schedule of NovoLog based on his  preprandial blood sugars.  The  patient, over the first 24 hours, did consume small amounts of clear  liquids, but still felt quite nauseated and weak.  With the beginning of the  second hospital day, he was able to consume at least 2-3 times as much clear  liquids as on the initial day.   In addition, the patient was seen in consultation by Dr. Su Grand, his  urologist, who felt that there was no prior relationship between his seed  implants for his prostate cancer and this acute gastroenteritis.  With  improvement in hydration, the patient's activity, strength and appetite  improved significantly.  He did develop some hypokalemia, which developed or  was documented on the last hospital day, and he was given potassium chloride  to take at home.  In addition, he did have a slight elevation in  transaminases, but it was felt to be related to IV medications for the acute  viral illness, and plans were made to repeat those about 2  weeks into his  convalescence, and acute hepatitis panel was done and found to be negative.  He had no other problems during his hospitalization, and was discharged in a  condition that was significantly improved.  His orthostatic changes and  blood pressure resolved with rehydration.  He was discharged back home on  the exact same medications - Lipitor 80, his _________, Clarinex, Volmax,  Prevacid, Coreg.  He is scheduled to be seen back in the office in 2 weeks.  His creatinine on discharge was 2.6, and he was instructed to follow up with  Dr. Caryn Section, his nephrologist.   DISCHARGE CONDITION:  Improved.   PROGNOSIS:  Considered to be fair.           ______________________________  Evan [name], M.D.     PC/MEDQ  D:  11/28/2005  T:  11/29/2005  Job:  [id number]

## 2010-12-22 NOTE — Op Note (Signed)
Evan [name], Evan [name]                          ACCOUNT NO.:  [id number]   MEDICAL RECORD NO.:  [id number]                   PATIENT TYPE:  AMB   LOCATION:  ENDO                                 FACILITY:  [facility]   PHYSICIAN:  Petra Kuba, M.D.                 DATE OF BIRTH:  [birth year]   DATE OF PROCEDURE:  05/11/2003  DATE OF DISCHARGE:                                 OPERATIVE REPORT   PROCEDURE:  Esophagogastroduodenoscopy with biopsy.   INDICATIONS FOR PROCEDURE:  A patient with a history of ulcer, guaiac-  positive anemia want to recheck. Consent was signed prior to any premeds  given after risks, benefits, methods, and options were thoroughly discussed  multiple times in the past.   ADDITIONAL MEDICINES FOR THIS PROCEDURE:  2 of Versed only.   DESCRIPTION OF PROCEDURE:  The scope was inserted by direct vision, the  proximal and mid esophagus was normal. In the distal esophagus was a small  hiatal hernia. The scope passed into the stomach and advanced through a  normal pylorus into a normal duodenal bulb. The ulcer had healed around the  C loop to a normal second portion of the duodenum. The scope was withdrawn  back to the bulb and a good look there did not reveal any signs of the  previously seen ulcer. The scope was withdrawn back to the stomach and  retroflexed. High in the cardia, the hiatal hernia was confirmed. The  angularis, fundus, lesser and greater curve were normal except for some mild  gastritis. Straight visualization of the stomach confirmed a mild to  moderate amount of antritis but otherwise no additional findings other than  mild gastritis. A few biopsies of the antrum and then a few of the proximal  stomach were obtained to rule out Helicobacter. Air was suctioned, scope  slowly withdrawn. Again a good look at the esophagus and the hiatal hernia  pouch were normal. The scope was removed. The patient tolerated the  procedure well. There was no obvious or  immediate complication.   ENDOSCOPIC DIAGNOSIS:  1. Small hiatal hernia.  2. Antritis and minimal gastritis status post biopsy.  3. Healed old duodenal ulcer.  4. Otherwise normal esophagogastroduodenoscopy (EGD).   PLAN:  Await pathology and if still Helicobacter positive would re-treat.  Continue Prevacid to prevent ulcer recurrence and particularly if aspirin or  nonsteroidals are needed in the future. Be happy to see back p.r.n.,  otherwise, return care to Dr. Chestine Spore and Caryn Section for continual medical management  to include recheck guaiac and CBC as needed.                                               Petra Kuba, M.D.    MEM/MEDQ  D:  05/11/2003  T:  05/11/2003  Job:  [id number]   cc:   Wilber Bihari. Caryn Section, M.D.  [address]  [place]  [place] [address]  Fax: [phone]   Demetria Pore. Coral Spikes, M.D.  [address]  [place]  [place] [address]  Fax: [phone]   Margaretmary Bayley, M.D.  [address]  [place]  [place] [address]  Fax: [phone]

## 2010-12-22 NOTE — Discharge Summary (Signed)
Evan [name], Evan [name]                          ACCOUNT NO.:  [id number]   MEDICAL RECORD NO.:  [id number]                   PATIENT TYPE:  INP   LOCATION:  4735                                 FACILITY:  [facility]   PHYSICIAN:  Margaretmary Bayley, M.D.                 DATE OF BIRTH:  06-01-1932   DATE OF ADMISSION:  02/01/2003  DATE OF DISCHARGE:  02/04/2003                                 DISCHARGE SUMMARY   DISCHARGE DIAGNOSES:  1. Chronic duodenal ulcer with hemorrhage.  2. Positive Helicobacter pylori.  3. Chronic blood loss anemia.  4. Urolithiasis.  5. Type 2 diabetes mellitus with mild renal insufficiency.  6. Benign polyps of the stomach.  7. Diaphragmatic hernia.  8. Orthostatic hypotension secondary to his GI bleed.   REASON FOR ADMISSION:  This is one of several Evan [name]. [name] hospitalization s for Dr. 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  The patient was brought into the emergency room where  he was evaluated and found to have relatively low blood pressures for him,  orthostatic changes and pulse rate and blood pressure, significantly anemic  and stools positive for blood.   PERTINENT PHYSICAL AND DIAGNOSTIC FINDINGS:  His admission EKG revealed a  normal sinus rhythm, no deviation of his electrical axis and a normal  tracing.   An abdominal series revealed changes consistent with a probable left renal  calculus.  He had no obstructive gas pattern.   His admission white count is 16,900 with a shift to the left, hematocrit of  18 with a hemoglobin of 6, platelet count of 188,000. Sed rate of 26.  Comprehensive metabolic panel is normal  with the exception of a CO2 of 18,  glucose 287, BUN 146, creatinine 3.3, calcium 8.2, total protein 5, albumin  2.7 and a uric acid of 7.2.  His cardiac enzymes are all completely normal.  TSH is normal at 2.5.  Iron 112/TIBC of 261 with 43% saturation.  B12 432,  folate greater than 20.  Ferritin level 25.  His cortisol 19.6.  Urine  specific gravity is 1013, pH 5, negative for glucose, hemoglobin, bilirubin,  ketones; nitrates positive, he had moderate leukocyte esterase and many  bacteria.  A subsequent urine culture grew out a Staph species sensitive to  gentamicin, nitrofurantoin, Rifampin, vancomycin, tetracycline.  His H.  pylori was urease negative, however, his antibody was strongly positive at  7.32.   HOSPITAL COURSE:  The patient was admitted to a telemetry bed where  transfusions were continued with indications being a significantly low  hematocrit and hemoglobin and orthostatic changes in blood pressure.  The  patient was seen in consultation by the GI staff here in the hospital where,  based on the patient's description of melanotic stools, he underwent upper  endoscopic examination revealing a fairly significant duodenal ulcer.  There  was no evidence of active bleeding at the time of his study.  Lower  evaluation revealed small polyps but no other significant findings were  noted.  Because of his strongly positive H. pylori antibody titer, the  patient was started on [facility] therapy which he tolerated quite well.  His  urine grew greater than 1000 colonies of gram negative rods as well as Staph  species as noted above.  This was treated with 500 of Cipro twice a day.  With the use of the proton pump inhibitors, the patient did quite well.  He  continued to have positive stools for blood, however, there was no sign of  any fresh or active GI bleeding after his admission.  He was seen in  consultation by Dr. Su Grand, M.D., of the urology service because of a 5 x  9 mm  calcification of the left kidney, felt to represent a stone. This  finding was not felt to be acute by any nature and it was felt that this  could be worked up and evaluated as an outpatient.   The patient's diabetes and hypertension were controlled quite well during  his hospitalization.  Initially, he came in with a significant elevation in  BUN, most of which was felt to be related to his GI bleed.  During the  course of his hospital stay, his creatinine declined from an admission level  of 3.5 to 2.8 at the time of his discharge.  Will plan on repeating that on  his initial office visit.  He had no further complications or problems  during his admission.   He was scheduled to complete his course of PREVPAC as an outpatient and then  to continue Prevacid at 300 mg daily thereafter.  Iron supplements included  Ferro-Sequels one twice a day with meals.  He was asked to hold his aspirin  and hydrochlorothiazide until reordered as an outpatient.  He is instructed  to resume his Actos of 17.5 mg per day, Avapro 300 mg per day, Amaryl 8 mg  daily.  Diltiazem 240 mg daily.  Centrum multivitamin and Lipitor 20 mg per  day.   CONDITION ON DISCHARGE:  Improved.   PROGNOSIS:  Fair.   DISPOSITION:  He is discharged to home on an 1800 calorie, small volume,  carb modified diet.                                                Margaretmary Bayley, M.D.    PC/MEDQ  D:  03/25/2003  T:  03/26/2003  Job:  [id number]

## 2010-12-22 NOTE — Consult Note (Signed)
Evan, [name]                          ACCOUNT NO.:  [id number]   MEDICAL RECORD NO.:  [id number]                   PATIENT TYPE:  INP   LOCATION:  1846                                 FACILITY:  [facility]   PHYSICIAN:  Petra Kuba, M.D.                 DATE OF BIRTH:  12/20/1931   DATE OF CONSULTATION:  02/01/2003  DATE OF DISCHARGE:                                   CONSULTATION   HISTORY:  The patient with an essentially negative GI history who is seen at  the request of Dr. Margaretmary Bayley for GI bleeding, had been sick at home with  24-48 hours of nausea/vomiting and then noted some black diarrhea, really  had not had much abdominal pain, with increased weakness and dizziness,  presented to the emergency room, and is admitted with a hemoglobin of 6.  I  am consulted for further workup and plans.  He has been on an aspirin a day  but no other aspirin or nonsteroidals.  He has not had any previous bleeding  or GI workup or tests in the past.   PAST MEDICAL HISTORY:  Pertinent for some high blood pressure, chronic renal  insufficiency, and some diabetes.   FAMILY HISTORY:  Pertinent for his mother with pancreatic cancer but no  ulcers or other GI problems like colon cancer.   MEDICINES AT HOME:  Include aspirin, multivitamin, hydrochlorothiazide,  Avapro, Lipitor, Amaryl, Actos, and Cardizem.   SOCIAL HISTORY:  Does not smoke or drink, and only uses the one aspirin as  above.   ALLERGIES:  None.   REVIEW OF SYSTEMS:  Negative except above.   PHYSICAL EXAMINATION:  VITAL SIGNS:  See chart.  GENERAL:  He is lying comfortably in the bed, was orthostatic in the  emergency room.  LUNGS:  Clear.  HEART:  Regular rate and rhythm.  ABDOMEN:  Soft and nontender.   LABORATORY DATA:  Pertinent for a white count of 16.9, hemoglobin is 6, MCV  81, platelets 188.  CO2 18, glucose 287, BUN 146, and creatinine 3.3 - both  are above the baseline according to Dr. Caryn Section; usually his  baseline is about a  creatinine of 2.5.  Albumin 2.7 with normal liver tests.  Supposedly on x-  ray the family did say he had evidence of kidney stones.   ASSESSMENT:  1. Multiple medical problems including chronic renal insufficiency,     diabetes.  2. Gastrointestinal bleed probably related to his aspirin a day, and with     black stools probably upper.   PLAN:  The risks, benefits, methods of endoscopy were discussed with both  the patient and his family.  Will proceed at 5 p.m. today after  transfusions, with further workup and plans pending those findings.  Petra Kuba, M.D.    MEM/MEDQ  D:  02/01/2003  T:  02/01/2003  Job:  [id number]   cc:   Demetria Pore. Coral Spikes, M.D.  [address]  [place]  [place] [address]  Fax: [phone]   Margaretmary Bayley, M.D.  [address]  [place]  [place] [address]  Fax: [phone]   Wilber Bihari. Caryn Section, M.D.  [address]  [place]  [place] [address]  Fax: [phone]

## 2010-12-22 NOTE — Op Note (Signed)
NAMEKEOLA, HENINGER                          ACCOUNT NO.:  [id number]   MEDICAL RECORD NO.:  [id number]                   PATIENT TYPE:  AMB   LOCATION:  ENDO                                 FACILITY:  [facility]   PHYSICIAN:  Petra Kuba, M.D.                 DATE OF BIRTH:  1932-07-27   DATE OF PROCEDURE:  05/11/2003  DATE OF DISCHARGE:                                 OPERATIVE REPORT   PROCEDURE:  Colonoscopy.   INDICATIONS FOR PROCEDURE:  Guaiac positive anemia due for colonic  screening.   Consent was signed after risks, benefits, methods, and options were  thoroughly discussed in the office.   MEDICINES USED:  Demerol 75, Versed 6.   DESCRIPTION OF PROCEDURE:  Rectal inspection was pertinent for external  hemorrhoids, small. Digital exam was negative. The video colonoscope was  inserted, easily advanced around the colon to the cecum. This did require  some abdominal pressure but no position changes. An occasional right and  left diverticula was seen on insertion but no other abnormalities. The cecum  was identified by the appendiceal orifice and the ileocecal valve. In fact,  the scope was inserted a short ways into the terminal ileum which was  normal. Photo documentation was obtained. The scope was slowly withdrawn.  The prep was adequate. There was some liquid stool that required washing and  suctioning. On slow withdrawal through the colon, other than the occasional  right and left sided diverticula, no abnormalities were seen as we slowly  withdrew back to the rectum. In the rectum, three tiny polyps were seen and  were all cold biopsied and put in the same container. Two did look  hyperplastic, the other was just slightly reddened. Anal rectal pull through  and retroflexion confirmed some small hemorrhoids. The scope was reinserted  a short ways up the left side of the colon, air was suctioned, scope  removed. The patient tolerated the procedure well. There was no  obvious or  immediate complication.   ENDOSCOPIC DIAGNOSIS:  1. Internal and external hemorrhoids.  2. Tiny rectal polyp cold biopsied.  3. Occasional right and left diverticula.  4. Otherwise within normal limits to the terminal ileum.   PLAN:  Await pathology but probably recheck colon screening in five years if  doing well medically, otherwise, will go ahead and proceed with EGD to  recheck his ulcer.                                               Petra Kuba, M.D.    MEM/MEDQ  D:  05/11/2003  T:  05/11/2003  Job:  [id number]   cc:   Wilber Bihari. Caryn Section, M.D.  [address]  [place]  [place] [address]  Fax: [phone]  Margaretmary Bayley, M.D.  [address] [place] [address]  [place]  [place] [address]  Fax: [phone]   Demetria Pore. Coral Spikes, M.D.  [address]  [place]  [place] [address]  Fax: [phone]

## 2010-12-22 NOTE — Op Note (Signed)
[name], [name]                ACCOUNT NO.:  [id number]   MEDICAL RECORD NO.:  [id number]          PATIENT TYPE:  AMB   LOCATION:  NESC                         FACILITY:  [facility]   PHYSICIAN:  Lindaann Slough, M.D.  DATE OF BIRTH:  [birth year]   DATE OF PROCEDURE:  06/11/2005  DATE OF DISCHARGE:                                 OPERATIVE REPORT   PREOPERATIVE DIAGNOSIS:  Carcinoma of prostate.   POSTOPERATIVE DIAGNOSIS:  Carcinoma of prostate.   PROCEDURE:  I125 seeds implantation and cystoscopy.   INDICATIONS:  The patient is a 77 year old male who had a positive prostate  biopsy for adenocarcinoma, Gleason score 6. PSA at the time of diagnosis was  4.81. Treatment options were discussed with him and his wife and they chose  to have seeds implantation. He is scheduled today for the procedure.   Under general anesthesia, the patient was prepped and draped and placed in  the dorsolithotomy position. The dosimetry was done. Two stabilizing needles  were placed in the prostate. Then under ultrasound guidance, 49 seeds were  implanted in the prostate through 22 needles. The total activities is 25.382  mCi.   Then the Foley catheter that was previously placed in the bladder was  removed. A flexible cystoscope was then inserted in the bladder. The  anterior urethra is normal. He has moderate prostatic hypertrophy. The  bladder mucosa is normal. There is no stone or tumor in the bladder and  there is no evidence of seeds in the urethra nor in the bladder. The  cystoscope was then removed. A #16-French Coude catheter was then inserted  in the bladder.   The patient tolerated the procedure well and left the OR in satisfactory  condition to post anesthesia care unit.      Lindaann Slough, M.D.  Electronically Signed     MN/MEDQ  D:  06/11/2005  T:  06/11/2005  Job:  [id number]   cc:   Margaretmary Bayley, M.D.  Fax: [phone]   Maryln Gottron, M.D.  Fax: [phone]

## 2010-12-22 NOTE — Op Note (Signed)
NAMEADIEN, KIMMEL                          ACCOUNT NO.:  [id number]   MEDICAL RECORD NO.:  [id number]                   PATIENT TYPE:  INP   LOCATION:  4735                                 FACILITY:  [facility]   PHYSICIAN:  Petra Kuba, M.D.                 DATE OF BIRTH:  06/15/1932   DATE OF PROCEDURE:  02/01/2003  DATE OF DISCHARGE:                                 OPERATIVE REPORT   PROCEDURE:  Esophagogastroduodenoscopy.   INDICATIONS FOR PROCEDURE:  Gastrointestinal bleeding from a patient on 1  aspirin a day. Consent was signed  after the risks and benefits, methods and  options were thoroughly discussed with the patient and the family.   MEDICATIONS:  Demerol 70, Versed 7.   DESCRIPTION OF PROCEDURE:  The video endoscope was inserted by direct  vision. The esophagus was normal. There was a tiny hiatal hernia. The scope  passed into the stomach  and some clear  fluid was suctioned. No blood was  seen.   I advanced  through a normal pylorus and antrum into the duodenal bulb where  a small shallow ulcer was seen. There was no adverse stigmata. There was  some inflammation in the rest of the bulb but no other ulceration. The scope  was advanced around to a normal 2nd portion of the duodenum. No blood was  seen distally.   The scope was withdrawn back to the bulb and a good look there confirmed the  above findings. The ulcer was washed and watched and could not be made to  bleed. The scope was withdrawn back to the stomach  and retroflexed. The  cardia angularis, lesser and greater curve were normal except for a small  proximal  on the greater curve. Straight visualization of the stomach did  not reveal any additional findings.   We went ahead and took 1 biopsy of the antrum for the clo test to rule out  Helicobacter, and took biopsies of the polyp and that was put in a pathology  container. The scope was reinserted into the duodenum. No additional  findings were seen.  We advanced  again to the 2nd portion of the duodenum.   The scope was then slowly withdrawn. Air was suctioned from the stomach  again and a good look at the esophagus on a slow withdrawal was normal. The  scope was removed.   The patient tolerated the procedure well. There was some post procedure  hypotension after we were though which we went ahead and reversed him using  Narcan and Romazicon and his blood pressure slowly rose.   ENDOSCOPIC DIAGNOSES:  1. Tiny hiatal hernia.  2. Tiny proximal gastric polyp, cold biopsied.  3. Duodenal ulcer with a flat white face and some surrounding inflammation.  4. Otherwise normal esophagogastroduodenoscopy without any blood being seen,     status post  clo biopsy in the antrum  to rule out Helicobacter.   PLAN:  Await pathology. Treat Helicobacter at some point in the future if  positive. Probably deserves an outpatient colonoscopy just to be sure when  he is stable unless needed sooner for continual bleeding. Hold all aspirin  for probably 2 weeks, and we had discussed whether he needs aspirin or  Plavix and would use it in the future continuing pump inhibitors long-term  to prevent ulcer recurrence and would like to see back to recheck symptoms,  Guaiacs and probably proceed with patient colonoscopy. In the meantime can  slowly advance diet, change him to p.o. Protonix, follow hemoglobin and  hematocrit and BUN and creatinine until back to baseline and transfuse  p.r.n.                                               Petra Kuba, M.D.    MEM/MEDQ  D:  02/01/2003  T:  02/01/2003  Job:  [id number]   cc:   Margaretmary Bayley, M.D.  [address]  [place]  [place] [address]  Fax: [phone]   Demetria Pore. Coral Spikes, M.D.  [address]  [place]  [place] [address]  Fax: [phone]   Wilber Bihari. Caryn Section, M.D.  [address]  [place]  [place] [address]  Fax: [phone]

## 2010-12-22 NOTE — Consult Note (Signed)
   NAMEBRAXTON, Evan [name]                          ACCOUNT NO.:  [id number]   MEDICAL RECORD NO.:  [id number]                   PATIENT TYPE:  INP   LOCATION:  4735                                 FACILITY:  [facility]   PHYSICIAN:  Lindaann Slough, M.D.               DATE OF BIRTH:  May 08, 1932   DATE OF CONSULTATION:  02/03/2003  DATE OF DISCHARGE:                                   CONSULTATION   REASON FOR CONSULTATION:  Kidney stones.   The patient is a 77 year old male who was admitted with GI bleeding. He was  found on KUB to have a 5 x 9 mm calcification in the left kidney. He does  not have any pain.   PAST MEDICAL HISTORY:  He has a history of kidney stones. He has a history  of diabetes and renal insufficiency.   He currently has no voiding symptoms, but he has been taking Flomax for  frequency and hesitancy.   On physical exam, he has no CVA tenderness at this time. His kidneys are not  palpable. His bladder is not distended.   Urinalysis shows 3 to 6 WBCs and few bacteria.   BUN is 134, creatinine 3.2.   KUB shows a 5 x 9 mm stone in the left kidney.   Since the patient is asymptomatic and is recovering from GI bleeding, no  further workup or treatment is needed at this time. We will follow him up as  an outpatient. We will then discuss with him regarding further evaluation  and management of the renal stone. Treatment options will include ESL or  observation. We will discuss this with the patient in the office as an  outpatient.                                               Lindaann Slough, M.D.    MN/MEDQ  D:  02/03/2003  T:  02/03/2003  Job:  [id number]

## 2011-04-27 LAB — POCT CARDIAC MARKERS
CKMB, poc: 2.6
Myoglobin, poc: 280
Operator id: 272551
Troponin i, poc: <0.05

## 2011-04-27 LAB — POCT I-STAT CREATININE: Operator id: 272551

## 2011-04-27 LAB — I-STAT 8, (EC8 V) (CONVERTED LAB)
BUN: 40 — ABNORMAL HIGH
Chloride: 111
pCO2, Ven: 38.8 — ABNORMAL LOW
pH, Ven: 7.35 — ABNORMAL HIGH

## 2011-05-02 LAB — BASIC METABOLIC PANEL
BUN: 49 — ABNORMAL HIGH
Creatinine, Ser: 2.93 — ABNORMAL HIGH
GFR calc non Af Amer: 21 — ABNORMAL LOW

## 2011-05-02 LAB — POTASSIUM: Potassium: 5.4 — ABNORMAL HIGH

## 2011-05-02 LAB — CBC
MCV: 81
Platelets: 184
WBC: 13.6 — ABNORMAL HIGH

## 2011-05-08 LAB — CBC
HCT: 32.7 % — ABNORMAL LOW (ref 39.0–52.0)
Hemoglobin: 11 g/dL — ABNORMAL LOW (ref 13.0–17.0)
Platelets: 276 10*3/uL (ref 150–400)
WBC: 8.5 10*3/uL (ref 4.0–10.5)

## 2011-05-08 LAB — URINALYSIS, ROUTINE W REFLEX MICROSCOPIC
Ketones, ur: NEGATIVE mg/dL
Nitrite: NEGATIVE
Protein, ur: >300 mg/dL — AB

## 2011-05-08 LAB — URINE CULTURE

## 2011-05-08 LAB — PROTIME-INR: Prothrombin Time: 14.1 seconds (ref 11.6–15.2)

## 2011-05-08 LAB — COMPREHENSIVE METABOLIC PANEL
AST: 14 U/L (ref 0–37)
Calcium: 8.8 mg/dL (ref 8.4–10.5)
Chloride: 111 mEq/L (ref 96–112)
Creatinine, Ser: 4.23 mg/dL — ABNORMAL HIGH (ref 0.4–1.5)
GFR calc non Af Amer: 14 mL/min — ABNORMAL LOW (ref >60)
Potassium: 6.5 mEq/L (ref 3.5–5.1)
Total Protein: 6.5 g/dL (ref 6.0–8.3)

## 2011-05-08 LAB — URINE MICROSCOPIC-ADD ON

## 2011-05-08 LAB — APTT: aPTT: 47 seconds — ABNORMAL HIGH (ref 24–37)

## 2011-05-09 LAB — POCT I-STAT 4, (NA,K, GLUC, HGB,HCT)
Glucose, Bld: 115 — ABNORMAL HIGH
HCT: 45
Hemoglobin: 15.3
Potassium: 4.2
Sodium: 141

## 2011-05-09 LAB — GLUCOSE, CAPILLARY: Glucose-Capillary: 143 — ABNORMAL HIGH

## 2011-05-11 LAB — POCT I-STAT 4, (NA,K, GLUC, HGB,HCT)
Potassium: 5.4 mEq/L — ABNORMAL HIGH (ref 3.5–5.1)
Sodium: 140 mEq/L (ref 135–145)

## 2011-05-11 LAB — GLUCOSE, CAPILLARY
Glucose-Capillary: 150 mg/dL — ABNORMAL HIGH (ref 70–99)
Glucose-Capillary: 192 mg/dL — ABNORMAL HIGH (ref 70–99)
Glucose-Capillary: 227 mg/dL — ABNORMAL HIGH (ref 70–99)

## 2011-05-14 LAB — URINE MICROSCOPIC-ADD ON

## 2011-05-14 LAB — BASIC METABOLIC PANEL
BUN: 29 — ABNORMAL HIGH
BUN: 35 — ABNORMAL HIGH
BUN: 42 — ABNORMAL HIGH
CO2: 19
Calcium: 7.5 — ABNORMAL LOW
Calcium: 7.6 — ABNORMAL LOW
Chloride: 107
Chloride: 109
Creatinine, Ser: 2.53 — ABNORMAL HIGH
Creatinine, Ser: 2.62 — ABNORMAL HIGH
Creatinine, Ser: 2.98 — ABNORMAL HIGH
GFR calc Af Amer: 24 — ABNORMAL LOW
GFR calc Af Amer: 29 — ABNORMAL LOW
GFR calc Af Amer: 30 — ABNORMAL LOW
GFR calc non Af Amer: 20 — ABNORMAL LOW
Potassium: 4.3
Sodium: 138

## 2011-05-14 LAB — CBC
HCT: 33.4 — ABNORMAL LOW
HCT: 39.7
Hemoglobin: 10.5 — ABNORMAL LOW
MCHC: 34.1
MCV: 78.6
MCV: 79
Platelets: 371
Platelets: 378
Platelets: 430 — ABNORMAL HIGH
RBC: 3.91 — ABNORMAL LOW
RBC: 3.95 — ABNORMAL LOW
RBC: 4.27
RDW: 14
WBC: 20.1 — ABNORMAL HIGH
WBC: 20.6 — ABNORMAL HIGH
WBC: 23.3 — ABNORMAL HIGH
WBC: 26.8 — ABNORMAL HIGH

## 2011-05-14 LAB — URINE CULTURE
Colony Count: NO GROWTH
Culture: NO GROWTH
Special Requests: NEGATIVE

## 2011-05-14 LAB — URINALYSIS, ROUTINE W REFLEX MICROSCOPIC
Bilirubin Urine: NEGATIVE
Bilirubin Urine: NEGATIVE
Glucose, UA: NEGATIVE
Ketones, ur: NEGATIVE
Ketones, ur: NEGATIVE
Leukocytes, UA: NEGATIVE
Nitrite: NEGATIVE
Nitrite: NEGATIVE
Protein, ur: 100 — AB
Protein, ur: 100 — AB
Specific Gravity, Urine: 1.012
Urobilinogen, UA: 0.2
Urobilinogen, UA: 0.2
pH: 6

## 2011-05-14 LAB — H. PYLORI ANTIBODY, IGG: H Pylori IgG: 1 — ABNORMAL HIGH

## 2011-05-14 LAB — DIFFERENTIAL
Eosinophils Absolute: 0.2
Eosinophils Relative: 1
Lymphocytes Relative: 6 — ABNORMAL LOW
Monocytes Absolute: 0.6
Neutrophils Relative %: 90 — ABNORMAL HIGH

## 2011-05-15 LAB — BASIC METABOLIC PANEL
BUN: 45 — ABNORMAL HIGH
BUN: 45 — ABNORMAL HIGH
CO2: 25
Calcium: 7.8 — ABNORMAL LOW
Calcium: 8 — ABNORMAL LOW
Chloride: 108
Creatinine, Ser: 3.27 — ABNORMAL HIGH
GFR calc Af Amer: 23 — ABNORMAL LOW
GFR calc Af Amer: 27 — ABNORMAL LOW
GFR calc non Af Amer: 19 — ABNORMAL LOW
GFR calc non Af Amer: 20 — ABNORMAL LOW
GFR calc non Af Amer: 22 — ABNORMAL LOW
Glucose, Bld: 55 — ABNORMAL LOW
Potassium: 4.8
Potassium: 5.7 — ABNORMAL HIGH
Sodium: 136
Sodium: 137

## 2011-05-15 LAB — URINE CULTURE
Colony Count: NO GROWTH
Culture: NO GROWTH

## 2011-05-15 LAB — CBC
HCT: 33.4 — ABNORMAL LOW
HCT: 38.7 — ABNORMAL LOW
Hemoglobin: 11.5 — ABNORMAL LOW
Hemoglobin: 11.7 — ABNORMAL LOW
MCHC: 34.4
MCV: 78.3
Platelets: 365
Platelets: 374
RBC: 4.26
RBC: 4.27
RDW: 13.8
RDW: 14.2
WBC: 27.5 — ABNORMAL HIGH
WBC: 38.5 — ABNORMAL HIGH

## 2011-05-15 LAB — URINALYSIS, ROUTINE W REFLEX MICROSCOPIC
Bilirubin Urine: NEGATIVE
Glucose, UA: 250 — AB
Ketones, ur: NEGATIVE
Leukocytes, UA: NEGATIVE
pH: 6

## 2011-05-15 LAB — URINE MICROSCOPIC-ADD ON

## 2011-05-15 LAB — DIFFERENTIAL
Eosinophils Relative: 0
Lymphs Abs: 1.2
Monocytes Relative: 1 — ABNORMAL LOW
Neutrophils Relative %: 96 — ABNORMAL HIGH

## 2011-05-15 LAB — CULTURE, BLOOD (ROUTINE X 2): Culture: NO GROWTH

## 2011-08-09 ENCOUNTER — Other Ambulatory Visit: Payer: Self-pay | Admitting: Gastroenterology

## 2011-11-08 ENCOUNTER — Other Ambulatory Visit: Payer: Self-pay | Admitting: Internal Medicine

## 2011-11-08 DIAGNOSIS — M549 Dorsalgia, unspecified: Secondary | ICD-10-CM

## 2011-11-08 DIAGNOSIS — M545 Low back pain: Secondary | ICD-10-CM

## 2011-11-13 ENCOUNTER — Ambulatory Visit (HOSPITAL_COMMUNITY)
Admission: RE | Admit: 2011-11-13 | Discharge: 2011-11-13 | Disposition: A | Discharge status: Home / Self Care | Payer: Medicare Other | Source: Ambulatory Visit | Attending: Internal Medicine | Admitting: Internal Medicine

## 2011-11-13 ENCOUNTER — Other Ambulatory Visit: Payer: Self-pay | Admitting: Internal Medicine

## 2011-11-13 ENCOUNTER — Other Ambulatory Visit (HOSPITAL_COMMUNITY): Payer: BC Managed Care – PPO

## 2011-11-13 DIAGNOSIS — M545 Low back pain, unspecified: Secondary | ICD-10-CM | POA: Insufficient documentation

## 2011-11-13 DIAGNOSIS — M51379 Other intervertebral disc degeneration, lumbosacral region without mention of lumbar back pain or lower extremity pain: Secondary | ICD-10-CM | POA: Insufficient documentation

## 2011-11-13 DIAGNOSIS — M549 Dorsalgia, unspecified: Secondary | ICD-10-CM

## 2011-11-13 DIAGNOSIS — M5137 Other intervertebral disc degeneration, lumbosacral region: Secondary | ICD-10-CM | POA: Insufficient documentation

## 2011-11-13 DIAGNOSIS — M899 Disorder of bone, unspecified: Secondary | ICD-10-CM | POA: Insufficient documentation

## 2011-11-13 DIAGNOSIS — M47817 Spondylosis without myelopathy or radiculopathy, lumbosacral region: Secondary | ICD-10-CM | POA: Insufficient documentation

## 2011-11-13 DIAGNOSIS — M79609 Pain in unspecified limb: Secondary | ICD-10-CM | POA: Insufficient documentation

## 2011-11-13 DIAGNOSIS — N269 Renal sclerosis, unspecified: Secondary | ICD-10-CM | POA: Insufficient documentation

## 2011-11-13 DIAGNOSIS — Q762 Congenital spondylolisthesis: Secondary | ICD-10-CM | POA: Insufficient documentation

## 2011-11-13 DIAGNOSIS — M5144 Schmorl's nodes, thoracic region: Secondary | ICD-10-CM | POA: Insufficient documentation

## 2011-11-13 DIAGNOSIS — M47814 Spondylosis without myelopathy or radiculopathy, thoracic region: Secondary | ICD-10-CM | POA: Insufficient documentation

## 2011-11-13 DIAGNOSIS — M546 Pain in thoracic spine: Secondary | ICD-10-CM | POA: Insufficient documentation

## 2011-12-21 ENCOUNTER — Other Ambulatory Visit (HOSPITAL_COMMUNITY): Payer: Self-pay | Admitting: *Deleted

## 2011-12-25 ENCOUNTER — Encounter (HOSPITAL_COMMUNITY)
Admission: RE | Admit: 2011-12-25 | Discharge: 2011-12-25 | Disposition: A | Discharge status: Home / Self Care | Payer: Medicare Other | Source: Ambulatory Visit | Attending: Nephrology | Admitting: Nephrology

## 2011-12-25 DIAGNOSIS — N2581 Secondary hyperparathyroidism of renal origin: Secondary | ICD-10-CM | POA: Insufficient documentation

## 2011-12-25 DIAGNOSIS — N182 Chronic kidney disease, stage 2 (mild): Secondary | ICD-10-CM | POA: Insufficient documentation

## 2011-12-25 DIAGNOSIS — D649 Anemia, unspecified: Secondary | ICD-10-CM | POA: Insufficient documentation

## 2011-12-25 LAB — COMPREHENSIVE METABOLIC PANEL
ALT: 14 U/L (ref 0–53)
AST: 16 U/L (ref 0–37)
Alkaline Phosphatase: 53 U/L (ref 39–117)
CO2: 21 mEq/L (ref 19–32)
Chloride: 105 mEq/L (ref 96–112)
GFR calc Af Amer: 50 mL/min — ABNORMAL LOW (ref >90)
GFR calc non Af Amer: 44 mL/min — ABNORMAL LOW (ref >90)
Glucose, Bld: 122 mg/dL — ABNORMAL HIGH (ref 70–99)
Sodium: 138 mEq/L (ref 135–145)
Total Bilirubin: 0.3 mg/dL (ref 0.3–1.2)

## 2011-12-25 LAB — IRON AND TIBC: UIBC: >575 ug/dL — ABNORMAL HIGH (ref 125–400)

## 2011-12-25 LAB — CBC
Hemoglobin: 10.8 g/dL — ABNORMAL LOW (ref 13.0–17.0)
Platelets: 179 10*3/uL (ref 150–400)
RBC: 3.91 MIL/uL — ABNORMAL LOW (ref 4.22–5.81)
WBC: 5.9 10*3/uL (ref 4.0–10.5)

## 2011-12-25 MED ORDER — EPOETIN ALFA 20000 UNIT/ML IJ SOLN
20000.0000 [IU] | INTRAMUSCULAR | Status: DC
  Administered 2011-12-25: 20000 [IU] via SUBCUTANEOUS
  Filled 2011-12-25: qty 1

## 2011-12-26 LAB — PTH, INTACT AND CALCIUM: Calcium, Total (PTH): 9.5 mg/dL (ref 8.4–10.5)

## 2012-01-01 ENCOUNTER — Encounter (HOSPITAL_COMMUNITY)
Admission: RE | Admit: 2012-01-01 | Discharge: 2012-01-01 | Disposition: A | Discharge status: Home / Self Care | Payer: Medicare Other | Source: Ambulatory Visit | Attending: Nephrology | Admitting: Nephrology

## 2012-01-01 MED ORDER — SODIUM CHLORIDE 0.9 % IV SOLN
INTRAVENOUS | Status: DC
  Administered 2012-01-01: 10:00:00 via INTRAVENOUS

## 2012-01-01 MED ORDER — FERUMOXYTOL INJECTION 510 MG/17 ML
510.0000 mg | INTRAVENOUS | Status: DC
  Administered 2012-01-01: 510 mg via INTRAVENOUS
  Filled 2012-01-01: qty 17

## 2012-01-07 ENCOUNTER — Other Ambulatory Visit (HOSPITAL_COMMUNITY): Payer: Self-pay | Admitting: *Deleted

## 2012-01-08 ENCOUNTER — Encounter (HOSPITAL_COMMUNITY)
Admission: RE | Admit: 2012-01-08 | Discharge: 2012-01-08 | Disposition: A | Discharge status: Home / Self Care | Payer: Medicare Other | Source: Ambulatory Visit | Attending: Nephrology | Admitting: Nephrology

## 2012-01-08 MED ORDER — SODIUM CHLORIDE 0.9 % IV SOLN
INTRAVENOUS | Status: AC
  Administered 2012-01-08: 11:00:00 via INTRAVENOUS

## 2012-01-08 MED ORDER — FERUMOXYTOL INJECTION 510 MG/17 ML
510.0000 mg | INTRAVENOUS | Status: AC
  Administered 2012-01-08: 510 mg via INTRAVENOUS
  Filled 2012-01-08: qty 17

## 2012-01-22 ENCOUNTER — Encounter (HOSPITAL_COMMUNITY)
Admission: RE | Admit: 2012-01-22 | Discharge: 2012-01-22 | Disposition: A | Discharge status: Home / Self Care | Payer: Medicare Other | Source: Ambulatory Visit | Attending: Nephrology | Admitting: Nephrology

## 2012-01-22 LAB — COMPREHENSIVE METABOLIC PANEL
ALT: 20 U/L (ref 0–53)
Albumin: 4.1 g/dL (ref 3.5–5.2)
Calcium: 9.4 mg/dL (ref 8.4–10.5)
GFR calc Af Amer: 42 mL/min — ABNORMAL LOW (ref >90)
Glucose, Bld: 103 mg/dL — ABNORMAL HIGH (ref 70–99)
Sodium: 139 mEq/L (ref 135–145)
Total Protein: 7.4 g/dL (ref 6.0–8.3)

## 2012-01-22 LAB — CBC
Hemoglobin: 10.7 g/dL — ABNORMAL LOW (ref 13.0–17.0)
MCH: 27.5 pg (ref 26.0–34.0)
MCHC: 33.8 g/dL (ref 30.0–36.0)
Platelets: 170 10*3/uL (ref 150–400)
RDW: 14.1 % (ref 11.5–15.5)

## 2012-01-22 MED ORDER — EPOETIN ALFA 20000 UNIT/ML IJ SOLN
20000.0000 [IU] | INTRAMUSCULAR | Status: DC
  Administered 2012-01-22: 20000 [IU] via SUBCUTANEOUS
  Filled 2012-01-22: qty 1

## 2012-01-23 LAB — IRON AND TIBC
Iron: 155 ug/dL — ABNORMAL HIGH (ref 42–135)
Saturation Ratios: 77 % — ABNORMAL HIGH (ref 20–55)
TIBC: 201 ug/dL — ABNORMAL LOW (ref 215–435)
UIBC: 46 ug/dL — ABNORMAL LOW (ref 125–400)

## 2012-01-24 ENCOUNTER — Inpatient Hospital Stay (HOSPITAL_COMMUNITY)
Admission: EM | Admit: 2012-01-24 | Discharge: 2012-01-28 | DRG: 065 | Disposition: A | Discharge status: Rehabilitation Facility | Payer: Medicare Other | Attending: Internal Medicine | Admitting: Internal Medicine

## 2012-01-24 ENCOUNTER — Emergency Department (HOSPITAL_COMMUNITY): Payer: Medicare Other

## 2012-01-24 ENCOUNTER — Encounter (HOSPITAL_COMMUNITY): Payer: Self-pay | Admitting: Emergency Medicine

## 2012-01-24 DIAGNOSIS — E119 Type 2 diabetes mellitus without complications: Secondary | ICD-10-CM | POA: Diagnosis present

## 2012-01-24 DIAGNOSIS — R29810 Facial weakness: Secondary | ICD-10-CM | POA: Diagnosis present

## 2012-01-24 DIAGNOSIS — I6350 Cerebral infarction due to unspecified occlusion or stenosis of unspecified cerebral artery: Principal | ICD-10-CM | POA: Diagnosis present

## 2012-01-24 DIAGNOSIS — I359 Nonrheumatic aortic valve disorder, unspecified: Secondary | ICD-10-CM | POA: Diagnosis present

## 2012-01-24 DIAGNOSIS — C61 Malignant neoplasm of prostate: Secondary | ICD-10-CM | POA: Diagnosis not present

## 2012-01-24 DIAGNOSIS — I6340 Cerebral infarction due to embolism of unspecified cerebral artery: Secondary | ICD-10-CM

## 2012-01-24 DIAGNOSIS — G459 Transient cerebral ischemic attack, unspecified: Secondary | ICD-10-CM

## 2012-01-24 DIAGNOSIS — I10 Essential (primary) hypertension: Secondary | ICD-10-CM

## 2012-01-24 DIAGNOSIS — Z8719 Personal history of other diseases of the digestive system: Secondary | ICD-10-CM

## 2012-01-24 DIAGNOSIS — Z8679 Personal history of other diseases of the circulatory system: Secondary | ICD-10-CM

## 2012-01-24 DIAGNOSIS — I639 Cerebral infarction, unspecified: Secondary | ICD-10-CM

## 2012-01-24 DIAGNOSIS — R4789 Other speech disturbances: Secondary | ICD-10-CM | POA: Diagnosis present

## 2012-01-24 DIAGNOSIS — I252 Old myocardial infarction: Secondary | ICD-10-CM

## 2012-01-24 DIAGNOSIS — Z8673 Personal history of transient ischemic attack (TIA), and cerebral infarction without residual deficits: Secondary | ICD-10-CM | POA: Diagnosis present

## 2012-01-24 DIAGNOSIS — Z940 Kidney transplant status: Secondary | ICD-10-CM

## 2012-01-24 DIAGNOSIS — I350 Nonrheumatic aortic (valve) stenosis: Secondary | ICD-10-CM | POA: Diagnosis present

## 2012-01-24 DIAGNOSIS — Z8546 Personal history of malignant neoplasm of prostate: Secondary | ICD-10-CM

## 2012-01-24 HISTORY — DX: Kidney transplant status: Z94.0

## 2012-01-24 HISTORY — DX: Malignant (primary) neoplasm, unspecified: C80.1

## 2012-01-24 HISTORY — DX: End stage renal disease: N18.6

## 2012-01-24 LAB — CBC
HCT: 32.7 % — ABNORMAL LOW (ref 39.0–52.0)
Hemoglobin: 11.2 g/dL — ABNORMAL LOW (ref 13.0–17.0)
MCH: 27.9 pg (ref 26.0–34.0)
MCV: 81.3 fL (ref 78.0–100.0)
Platelets: 187 10*3/uL (ref 150–400)
RBC: 4.02 MIL/uL — ABNORMAL LOW (ref 4.22–5.81)
WBC: 6.7 10*3/uL (ref 4.0–10.5)

## 2012-01-24 LAB — DIFFERENTIAL
Basophils Absolute: 0 10*3/uL (ref 0.0–0.1)
Basophils Relative: 0 % (ref 0–1)
Eosinophils Absolute: 0.1 10*3/uL (ref 0.0–0.7)
Eosinophils Relative: 1 % (ref 0–5)
Lymphocytes Relative: 18 % (ref 12–46)
Lymphs Abs: 1.2 10*3/uL (ref 0.7–4.0)
Monocytes Absolute: 0.3 10*3/uL (ref 0.1–1.0)
Monocytes Relative: 4 % (ref 3–12)
Neutro Abs: 5.2 10*3/uL (ref 1.7–7.7)
Neutrophils Relative %: 77 % (ref 43–77)

## 2012-01-24 LAB — APTT: aPTT: 47 seconds — ABNORMAL HIGH (ref 24–37)

## 2012-01-24 LAB — COMPREHENSIVE METABOLIC PANEL
ALT: 20 U/L (ref 0–53)
AST: 18 U/L (ref 0–37)
Albumin: 4.3 g/dL (ref 3.5–5.2)
Alkaline Phosphatase: 50 U/L (ref 39–117)
BUN: 32 mg/dL — ABNORMAL HIGH (ref 6–23)
CO2: 20 mEq/L (ref 19–32)
Calcium: 9.3 mg/dL (ref 8.4–10.5)
Chloride: 104 mEq/L (ref 96–112)
Creatinine, Ser: 1.6 mg/dL — ABNORMAL HIGH (ref 0.50–1.35)
GFR calc Af Amer: 46 mL/min — ABNORMAL LOW (ref >90)
GFR calc non Af Amer: 40 mL/min — ABNORMAL LOW (ref >90)
Glucose, Bld: 109 mg/dL — ABNORMAL HIGH (ref 70–99)
Potassium: 4.9 mEq/L (ref 3.5–5.1)
Sodium: 135 mEq/L (ref 135–145)
Total Bilirubin: 0.3 mg/dL (ref 0.3–1.2)
Total Protein: 7.5 g/dL (ref 6.0–8.3)

## 2012-01-24 LAB — POCT I-STAT, CHEM 8
BUN: 34 mg/dL — ABNORMAL HIGH (ref 6–23)
Calcium, Ion: 1.17 mmol/L (ref 1.12–1.32)
Chloride: 110 mEq/L (ref 96–112)
Potassium: 4.9 mEq/L (ref 3.5–5.1)

## 2012-01-24 LAB — TROPONIN I: Troponin I: <0.3 ng/mL (ref <0.30)

## 2012-01-24 LAB — CK TOTAL AND CKMB (NOT AT ARMC): CK, MB: 3.1 ng/mL (ref 0.3–4.0)

## 2012-01-24 LAB — GLUCOSE, CAPILLARY

## 2012-01-24 MED ORDER — PANTOPRAZOLE SODIUM 40 MG PO TBEC
40.0000 mg | DELAYED_RELEASE_TABLET | Freq: Every day | ORAL | Status: DC
  Administered 2012-01-24 – 2012-01-28 (×5): 40 mg via ORAL
  Filled 2012-01-24 (×5): qty 1

## 2012-01-24 MED ORDER — ACETAMINOPHEN 500 MG PO TABS: 1000.0000 mg | ORAL_TABLET | Freq: Three times a day (TID) | ORAL | Status: DC | PRN

## 2012-01-24 MED ORDER — TACROLIMUS 1 MG PO CAPS
5.0000 mg | ORAL_CAPSULE | Freq: Every day | ORAL | Status: DC
  Administered 2012-01-24: 5 mg via ORAL
  Filled 2012-01-24 (×2): qty 5

## 2012-01-24 MED ORDER — INSULIN ASPART 100 UNIT/ML ~~LOC~~ SOLN
0.0000 [IU] | Freq: Three times a day (TID) | SUBCUTANEOUS | Status: DC
  Administered 2012-01-25 – 2012-01-28 (×5): 2 [IU] via SUBCUTANEOUS

## 2012-01-24 MED ORDER — POLYETHYLENE GLYCOL 3350 17 G PO PACK
17.0000 g | PACK | Freq: Every day | ORAL | Status: DC
  Administered 2012-01-26: 17 g via ORAL
  Filled 2012-01-24 (×5): qty 1

## 2012-01-24 MED ORDER — SODIUM CHLORIDE 0.9 % IJ SOLN
3.0000 mL | Freq: Two times a day (BID) | INTRAMUSCULAR | Status: DC
  Administered 2012-01-24 – 2012-01-26 (×2): 3 mL via INTRAVENOUS

## 2012-01-24 MED ORDER — ENOXAPARIN SODIUM 40 MG/0.4ML ~~LOC~~ SOLN
40.0000 mg | SUBCUTANEOUS | Status: DC
  Administered 2012-01-24 – 2012-01-27 (×4): 40 mg via SUBCUTANEOUS
  Filled 2012-01-24 (×5): qty 0.4

## 2012-01-24 MED ORDER — SODIUM CHLORIDE 0.9 % IV SOLN
INTRAVENOUS | Status: DC
  Administered 2012-01-24 – 2012-01-26 (×5): via INTRAVENOUS

## 2012-01-24 MED ORDER — SODIUM CHLORIDE 0.9 % IV SOLN: INTRAVENOUS | Status: AC

## 2012-01-24 MED ORDER — MEGESTROL ACETATE 400 MG/10ML PO SUSP
400.0000 mg | Freq: Every day | ORAL | Status: DC
  Filled 2012-01-24: qty 10

## 2012-01-24 MED ORDER — SODIUM CHLORIDE 0.9 % IJ SOLN: 3.0000 mL | Freq: Two times a day (BID) | INTRAMUSCULAR | Status: DC

## 2012-01-24 MED ORDER — ENOXAPARIN SODIUM 40 MG/0.4ML ~~LOC~~ SOLN: 40.0000 mg | SUBCUTANEOUS | Status: DC

## 2012-01-24 MED ORDER — MYCOPHENOLATE SODIUM 180 MG PO TBEC
180.0000 mg | DELAYED_RELEASE_TABLET | Freq: Two times a day (BID) | ORAL | Status: DC
  Administered 2012-01-24 – 2012-01-28 (×8): 180 mg via ORAL
  Filled 2012-01-24 (×10): qty 1

## 2012-01-24 MED ORDER — CLOPIDOGREL BISULFATE 75 MG PO TABS
75.0000 mg | ORAL_TABLET | Freq: Every day | ORAL | Status: DC
  Administered 2012-01-24 – 2012-01-28 (×4): 75 mg via ORAL
  Filled 2012-01-24 (×5): qty 1

## 2012-01-24 MED ORDER — SODIUM CHLORIDE 0.9 % IV SOLN: 250.0000 mL | INTRAVENOUS | Status: DC | PRN

## 2012-01-24 MED ORDER — LORAZEPAM 2 MG/ML IJ SOLN
1.0000 mg | Freq: Once | INTRAMUSCULAR | Status: AC
  Administered 2012-01-24: 1 mg via INTRAVENOUS
  Filled 2012-01-24: qty 1

## 2012-01-24 MED ORDER — TACROLIMUS 1 MG PO CAPS: 4.0000 mg | ORAL_CAPSULE | ORAL | Status: DC

## 2012-01-24 MED ORDER — SULFAMETHOXAZOLE-TRIMETHOPRIM 400-80 MG PO TABS
1.0000 | ORAL_TABLET | ORAL | Status: DC
  Administered 2012-01-25 – 2012-01-28 (×2): 1 via ORAL
  Filled 2012-01-24 (×2): qty 1

## 2012-01-24 MED ORDER — SODIUM CHLORIDE 0.9 % IJ SOLN: 3.0000 mL | INTRAMUSCULAR | Status: DC | PRN

## 2012-01-24 MED ORDER — TACROLIMUS 1 MG PO CAPS
4.0000 mg | ORAL_CAPSULE | Freq: Every day | ORAL | Status: DC
  Administered 2012-01-25: 4 mg via ORAL
  Filled 2012-01-24 (×2): qty 4

## 2012-01-24 NOTE — ED Notes (Signed)
Wife arrived, MD at bedside to speak with her.  Per wife, pt had similar symptoms of weakness and slurred speech last night.

## 2012-01-24 NOTE — ED Notes (Addendum)
Pt c/o sudden onset generalized weakness and slurred speech beginning approx 1.5hr pta arrival.  Pt states symptoms are beginning to resolve.  Pt denies weakness more on one side, denies any chest pain, headache, or additional symptoms. Pt states he thought his blood sugar was low but it was 125 when he checked it.

## 2012-01-24 NOTE — ED Notes (Signed)
Patient transported to MRI 

## 2012-01-24 NOTE — ED Notes (Signed)
Dhungel, MD at bedside.

## 2012-01-24 NOTE — Consult Note (Signed)
Referring Physician: Alto Denver    Chief Complaint: Slurred speech, difficulty with gait.  HPI: Evan [name] is an 78 y.o. male who was at home today and visited by his daughter at about 12 noon. She felt that the patient's speech was slurred and when he got up to walk he was unsteady.  EMS was called at that time and the patient was brought in as a code stroke.  When the wife arrived it she gave more history and reports that the patient was "not right" even last evening and when he woke up today was not himself.  The patient by his report did not feel any different until he got up to walk and noted that he was off balance.    LSN: 01/23/2012 tPA Given: No: Outside time window  Past Medical History  Diagnosis Date  . H/O kidney transplant   . Diabetes mellitus     Past Surgical History  Procedure Date  . Kidney transplant     History reviewed. No pertinent family history. Social History:  reports that he has never smoked. He does not have any smokeless tobacco history on file. He reports that he does not drink alcohol. His drug history not on file.  Allergies: No Known Allergies  Medications: I have reviewed the patient's current medications. Prior to Admission:  Tylenol, ASA, Diltiazem, Lasix, Glucotrol, Apresoline, Megace, Myfortic, Prilosec, Miralax, Bactrim, Prograf  ROS: History obtained from the patient  General ROS: negative for - chills, fatigue, fever, night sweats, weight gain or weight loss Psychological ROS: negative for - behavioral disorder, hallucinations, memory difficulties, mood swings or suicidal ideation Ophthalmic ROS: negative for - blurry vision, double vision, eye pain or loss of vision ENT ROS: negative for - epistaxis, nasal discharge, oral lesions, sore throat, tinnitus or vertigo Allergy and Immunology ROS: negative for - hives or itchy/watery eyes Hematological and Lymphatic ROS: negative for - bleeding problems, bruising or swollen lymph nodes Endocrine  ROS: negative for - galactorrhea, hair pattern changes, polydipsia/polyuria or temperature intolerance Respiratory ROS: negative for - cough, hemoptysis, shortness of breath or wheezing Cardiovascular ROS: negative for - chest pain, dyspnea on exertion, edema or irregular heartbeat Gastrointestinal ROS: negative for - abdominal pain, diarrhea, hematemesis, nausea/vomiting or stool incontinence Genito-Urinary ROS: negative for - dysuria, hematuria, incontinence or urinary frequency/urgency Musculoskeletal ROS: negative for - joint swelling or muscular weakness Neurological ROS: as noted in HPI Dermatological ROS: negative for rash and skin lesion changes  Physical Examination: Blood pressure 124/59, pulse 75, resp. rate 18, SpO2 100.00%.  Neurologic Examination: Mental Status: Alert, oriented, thought content appropriate.  Speech slurred but fluent.  Able to follow 3 step commands without difficulty. Cranial Nerves: II: visual fields grossly normal, pupils equal, round, reactive to light and accommodation III,IV, VI: ptosis not present, extra-ocular motions intact bilaterally V,VII: right facial droop, facial light touch sensation normal bilaterally VIII: hearing normal bilaterally IX,X: gag reflex present XI: trapezius strength/neck flexion strength normal bilaterally XII: tongue strength normal  Motor: Right : Upper extremity   5-/5 with drift  Left:     Upper extremity   5/5  Lower extremity   5-/5     Lower extremity   5/5 Tone and bulk:normal tone throughout; no atrophy noted Sensory: Pinprick and light touch intact throughout, bilaterally Deep Tendon Reflexes: 1+ in the upper extremities, trace at the knees and absent at the ankles Plantars: Right: downgoing   Left: upgoing Cerebellar: normal finger-to-nose and normal heel-to-shin test   Results  for orders placed during the hospital encounter of 01/24/12 (from the past 48 hour(s))  PROTIME-INR     Status: Normal   Collection  Time   01/24/12  1:56 PM      Component Value Range Comment   Prothrombin Time 14.1  11.6 - 15.2 seconds    INR 1.07  0.00 - 1.49   APTT     Status: Abnormal   Collection Time   01/24/12  1:56 PM      Component Value Range Comment   aPTT 47 (*) 24 - 37 seconds   CBC     Status: Abnormal   Collection Time   01/24/12  1:56 PM      Component Value Range Comment   WBC 6.7  4.0 - 10.5 K/uL    RBC 4.02 (*) 4.22 - 5.81 MIL/uL    Hemoglobin 11.2 (*) 13.0 - 17.0 g/dL    HCT 16.1 (*) 09.6 - 52.0 %    MCV 81.3  78.0 - 100.0 fL    MCH 27.9  26.0 - 34.0 pg    MCHC 34.3  30.0 - 36.0 g/dL    RDW 04.5  40.9 - 81.1 %    Platelets 187  150 - 400 K/uL   DIFFERENTIAL     Status: Normal   Collection Time   01/24/12  1:56 PM      Component Value Range Comment   Neutrophils Relative 77  43 - 77 %    Neutro Abs 5.2  1.7 - 7.7 K/uL    Lymphocytes Relative 18  12 - 46 %    Lymphs Abs 1.2  0.7 - 4.0 K/uL    Monocytes Relative 4  3 - 12 %    Monocytes Absolute 0.3  0.1 - 1.0 K/uL    Eosinophils Relative 1  0 - 5 %    Eosinophils Absolute 0.1  0.0 - 0.7 K/uL    Basophils Relative 0  0 - 1 %    Basophils Absolute 0.0  0.0 - 0.1 K/uL   COMPREHENSIVE METABOLIC PANEL     Status: Abnormal   Collection Time   01/24/12  1:56 PM      Component Value Range Comment   Sodium 135  135 - 145 mEq/L    Potassium 4.9  3.5 - 5.1 mEq/L    Chloride 104  96 - 112 mEq/L    CO2 20  19 - 32 mEq/L    Glucose, Bld 109 (*) 70 - 99 mg/dL    BUN 32 (*) 6 - 23 mg/dL    Creatinine, Ser 9.14 (*) 0.50 - 1.35 mg/dL    Calcium 9.3  8.4 - 78.2 mg/dL    Total Protein 7.5  6.0 - 8.3 g/dL    Albumin 4.3  3.5 - 5.2 g/dL    AST 18  0 - 37 U/L    ALT 20  0 - 53 U/L    Alkaline Phosphatase 50  39 - 117 U/L    Total Bilirubin 0.3  0.3 - 1.2 mg/dL    GFR calc non Af Amer 40 (*) >90 mL/min    GFR calc Af Amer 46 (*) >90 mL/min   CK TOTAL AND CKMB     Status: Normal   Collection Time   01/24/12  1:56 PM      Component Value Range  Comment   Total CK 94  7 - 232 U/L    CK, MB 3.1  0.3 -  4.0 ng/mL    Relative Index RELATIVE INDEX IS INVALID  0.0 - 2.5   TROPONIN I     Status: Normal   Collection Time   01/24/12  1:56 PM      Component Value Range Comment   Troponin I <0.30  <0.30 ng/mL   GLUCOSE, CAPILLARY     Status: Abnormal   Collection Time   01/24/12  2:09 PM      Component Value Range Comment   Glucose-Capillary 116 (*) 70 - 99 mg/dL    Comment 1 Notify RN     POCT I-STAT, CHEM 8     Status: Abnormal   Collection Time   01/24/12  2:23 PM      Component Value Range Comment   Sodium 139  135 - 145 mEq/L    Potassium 4.9  3.5 - 5.1 mEq/L    Chloride 110  96 - 112 mEq/L    BUN 34 (*) 6 - 23 mg/dL    Creatinine, Ser 1.61 (*) 0.50 - 1.35 mg/dL    Glucose, Bld 096 (*) 70 - 99 mg/dL    Calcium, Ion 0.45  4.09 - 1.32 mmol/L    TCO2 18  0 - 100 mmol/L    Hemoglobin 13.3  13.0 - 17.0 g/dL    HCT 81.1  91.4 - 78.2 %    Ct Head Wo Contrast  01/24/2012  *RADIOLOGY REPORT*  Clinical Data: Weakness. Aphasia.  Stroke.  CT HEAD WITHOUT CONTRAST  Technique:  Contiguous axial images were obtained from the base of the skull through the vertex without contrast.  Comparison: None.  Findings: There is a vague low attenuation area adjacent to the posterior limb of the right internal capsule (image 14 series 2). This could represent a small infarct.  There may be some low attenuation of the right thalamus as well.  No territorial infarct is identified.  Posterior fossa structures appear within normal limits.  No mass lesion, midline shift, hydrocephalus or mass effect.  Bilateral lens extractions are present.  The paranasal sinuses demonstrate mild mucosal thickening in the left maxillary sinus.  Calvarium intact.  IMPRESSION: 1.  Vague area of low attenuation along the posterior limb of the right internal capsule which could represent a small infarct. While finding is asymmetric when compared to the contralateral side, confidence level  for acute/subacute infarction is low. 2. Critical Value/emergent results were called by telephone at the time of interpretation on 01/24/2012  at 1428 hours  to  Dr. Alto Denver, who verbally acknowledged these results.  Original Report Authenticated By: Andreas Newport, M.D.   Mr Brain Wo Contrast  01/24/2012  *RADIOLOGY REPORT*  Clinical Data: Weakness.  Diabetic.  History prostate cancer.  End- stage renal disease.  Diabetic.  MRI HEAD WITHOUT CONTRAST  Technique:  Multiplanar, multiecho pulse sequences of the brain and surrounding structures were obtained according to standard protocol without intravenous contrast.  Comparison: 01/24/2012 CT.  Findings: Non hemorrhagic acute left paracentral pontine infarct.  Remote small right paracentral pontine infarct.  Mild small vessel disease type changes.  White matter changes occipital lobes with a symmetric appearance. This probably represents result of small vessel disease.  In this patient with history of end-stage renal disease, mild posterior reversible encephalopathy syndrome changes difficult to completely exclude.  Global atrophy without hydrocephalus.  No intracranial mass lesion detected on this unenhanced exam.  No intracranial hemorrhage.  Cervical spondylotic changes.  Motion limits evaluation.  Altered signal intensity of bone marrow may  be related to result of anemia from end-stage renal disease.  Prostate metastatic lesions not entirely excluded.  Cerebellar tonsils minimally low-lying but within range normal limits.  Partial opacification right mastoid air cells without obstructing lesion noted in the region of the posterior-superior nasopharynx.  Minimal to mild ethmoid sinus air cell mucosal thickening.  Major intracranial vascular structures are patent with small vertebral arteries and basilar artery.  Atherosclerotic type changes of the vertebral artery suspected.  IMPRESSION: Non hemorrhagic acute left paracentral pontine infarct.  Remote small right  paracentral pontine infarct.  Mild small vessel disease type changes.  White matter changes occipital lobes with a symmetric appearance. This probably represents result of small vessel disease.  In this patient with history of end-stage renal disease, mild posterior reversible encephalopathy syndrome changes difficult to completely exclude.  Global atrophy without hydrocephalus.  Altered signal intensity of bone marrow may be related to result of anemia from end-stage renal disease.  Prostate metastatic lesions not entirely excluded.  Partial opacification right mastoid air cells.  Minimal to mild ethmoid sinus air cell mucosal thickening.  Major intracranial vascular structures are patent with small vertebral arteries and basilar artery.  Atherosclerotic type changes of the vertebral artery suspected.  Critical Value/emergent results were called by telephone at the time of interpretation on 01/24/2012  at 4:45 p.m.  to  Dr. Alto Denver, who verbally acknowledged these results.  Original Report Authenticated By: Fuller Canada, M.D.    Assessment: 78 y.o. male presenting with an acute left pontine infarct who clinically on exam is found to have slurred speech and mild right hemiparesis.  Is on aspirin currently.  Further work up recommended.    Stroke Risk Factors - diabetes mellitus  Plan: 1. HgbA1c, fasting lipid panel 2. PT consult, OT consult, Speech consult 3. Echocardiogram 4. Carotid dopplers 5. Prophylactic therapy-Antiplatelet med: Plavix - dose 75mg  daily 6. Risk factor modification 7. Telemetry monitoring 8. Frequent neuro checks   Thana Farr, MD [facility] [phone] 01/24/2012, 5:37 PM

## 2012-01-24 NOTE — ED Provider Notes (Signed)
History     CSN: [id number]  Arrival date & time 01/24/12  1322   First MD Initiated Contact with Patient 01/24/12 1344      Chief Complaint  Patient presents with  . Weakness  . Aphasia    (Consider location/radiation/quality/duration/timing/severity/associated sxs/prior treatment) HPI The patient is a 78 year old male who presents today with family due to concerns over acute onset of dysarthria at 37 PM today. His daughter notes that when she arrived at his home the patient had slurred speech and was unsteady on his feet. Patient has no history of stroke. He does have a history of diabetes as well as kidney transplant. Patient is not currently on dialysis that he has been in the past. Patient denies any fevers, chest pain, shortness of breath, numbness, tingling, weakness, or lightheadedness. Patient does feel that his symptoms are improving in family agrees to this. Patient does mention that at the time his symptoms began his family was concerned he might be hypoglycemic and they checked his blood sugar which was 160 at the time. Initial blood pressure appeared elevated at 159/138 but repeat was within normal limits with this being a 156/56. There are no other associated or modifying factors. Past Medical History  Diagnosis Date  . H/O kidney transplant   . Diabetes mellitus     Past Surgical History  Procedure Date  . Kidney transplant     History reviewed. No pertinent family history.  History  Substance Use Topics  . Smoking status: Never Smoker   . Smokeless tobacco: Not on file  . Alcohol Use: No      Review of Systems  Constitutional: Negative.   HENT: Negative.   Eyes: Negative.   Respiratory: Negative.   Cardiovascular: Negative.   Gastrointestinal: Negative.   Genitourinary: Negative.   Musculoskeletal: Negative.   Neurological: Positive for speech difficulty.       Unsteadiness  Hematological: Negative.   Psychiatric/Behavioral: Negative.   All other  systems reviewed and are negative.    Allergies  Review of patient's allergies indicates no known allergies.  Home Medications   Current Outpatient Rx  Name Route Sig Dispense Refill  . ACETAMINOPHEN 500 MG PO TABS Oral Take 1,000 mg by mouth every 6 (six) hours as needed. For pain    . ASPIRIN EC 81 MG PO TBEC Oral Take 81 mg by mouth daily.    Marland Kitchen DILTIAZEM HCL ER BEADS 300 MG PO CP24 Oral Take 300 mg by mouth daily.    . FUROSEMIDE 20 MG PO TABS Oral Take 10 mg by mouth daily.    Marland Kitchen GLIPIZIDE 5 MG PO TABS Oral Take 5 mg by mouth 2 (two) times daily before a meal.    . HYDRALAZINE HCL 25 MG PO TABS Oral Take 25 mg by mouth 2 (two) times daily.    . MEGESTROL ACETATE 400 MG/10ML PO SUSP Oral Take 400 mg by mouth daily as needed.    Marland Kitchen MYCOPHENOLATE SODIUM 180 MG PO TBEC Oral Take 180 mg by mouth 2 (two) times daily.    Marland Kitchen OMEPRAZOLE 40 MG PO CPDR Oral Take 40 mg by mouth 2 (two) times daily.    Marland Kitchen POLYETHYLENE GLYCOL 3350 PO PACK Oral Take 17 g by mouth daily.    . SULFAMETHOXAZOLE-TRIMETHOPRIM 400-80 MG PO TABS Oral Take 1 tablet by mouth every Monday, Wednesday, and Friday.    Marland Kitchen TACROLIMUS 1 MG PO CAPS Oral Take 4-5 mg by mouth See admin instructions. Take 4 capsules  with breakfast and 5 capsules with dinner      BP 136/47  Pulse 62  Resp 14  SpO2 100%  Physical Exam  Nursing note and vitals reviewed. GEN: Well-developed, thin male in no distress HEENT: Atraumatic, normocephalic. Oropharynx clear without erythema EYES: PERRLA BL, no scleral icterus. NECK: Trachea midline, no meningismus CV: regular rate and rhythm. No murmurs, rubs, or gallops PULM: No respiratory distress.  No crackles, wheezes, or rales. GI: soft, non-tender. No guarding, rebound, or tenderness. + bowel sounds  GU: deferred Neuro: cranial nerves 2-12 intact, patient does have mild dysarthria, no limb drift noted, no facial droop noted, no sensory deficits, alert and oriented x3, patient with no dysmetria on  finger to nose task bilaterally. No visual field deficits. No difficulty with heal to shin test bilaterally. MSK: Patient moves all 4 extremities symmetrically, no deformity, edema, or injury noted Skin: No rashes petechiae, purpura, or jaundice Psych: no abnormality of mood   ED Course  Procedures (including critical care time)  Indication: TIA vs. CVA Please note this EKG was reviewed extemporaneously by myself.   Date: 01/24/2012  Rate: 77  Rhythm: normal sinus rhythm  QRS Axis: normal  Intervals: normal  ST/T Wave abnormalities: nonspecific T wave changes  Conduction Disutrbances:none  Narrative Interpretation: T wave inversions in leads aVL as well as V5 and V6 compared to previous EKG from February of 2012. T wave flattening noted in lead 1 compared to previous.  Old EKG Reviewed: changes noted      Labs Reviewed  APTT - Abnormal; Notable for the following:    aPTT 47 (*)     All other components within normal limits  CBC - Abnormal; Notable for the following:    RBC 4.02 (*)     Hemoglobin 11.2 (*)     HCT 32.7 (*)     All other components within normal limits  COMPREHENSIVE METABOLIC PANEL - Abnormal; Notable for the following:    Glucose, Bld 109 (*)     BUN 32 (*)     Creatinine, Ser 1.60 (*)     GFR calc non Af Amer 40 (*)     GFR calc Af Amer 46 (*)     All other components within normal limits  GLUCOSE, CAPILLARY - Abnormal; Notable for the following:    Glucose-Capillary 116 (*)     All other components within normal limits  POCT I-STAT, CHEM 8 - Abnormal; Notable for the following:    BUN 34 (*)     Creatinine, Ser 1.70 (*)     Glucose, Bld 105 (*)     All other components within normal limits  PROTIME-INR  DIFFERENTIAL  CK TOTAL AND CKMB  TROPONIN I  URINE RAPID DRUG SCREEN (HOSP PERFORMED)  TACROLIMUS LEVEL  URINALYSIS, ROUTINE W REFLEX MICROSCOPIC   Ct Head Wo Contrast  01/24/2012  *RADIOLOGY REPORT*  Clinical Data: Weakness. Aphasia.   Stroke.  CT HEAD WITHOUT CONTRAST  Technique:  Contiguous axial images were obtained from the base of the skull through the vertex without contrast.  Comparison: None.  Findings: There is a vague low attenuation area adjacent to the posterior limb of the right internal capsule (image 14 series 2). This could represent a small infarct.  There may be some low attenuation of the right thalamus as well.  No territorial infarct is identified.  Posterior fossa structures appear within normal limits.  No mass lesion, midline shift, hydrocephalus or mass effect.  Bilateral lens  extractions are present.  The paranasal sinuses demonstrate mild mucosal thickening in the left maxillary sinus.  Calvarium intact.  IMPRESSION: 1.  Vague area of low attenuation along the posterior limb of the right internal capsule which could represent a small infarct. While finding is asymmetric when compared to the contralateral side, confidence level for acute/subacute infarction is low. 2. Critical Value/emergent results were called by telephone at the time of interpretation on 01/24/2012  at 1428 hours  to  Dr. Alto Denver, who verbally acknowledged these results.  Original Report Authenticated By: Andreas Newport, M.D.     No diagnosis found.    MDM  Patient was evaluated by myself. Based on presentation I did have concern for possible stroke versus TIA. Last normal time was initially reported by family to be at 12 noon. The patient initially reported that he was back to baseline. Approximately 20 minutes into the visit family reported that they did not fill it he was back to his baseline. Based on timeline code stroke was then called. As I was awaiting return call for neurology the patient's wife arrived and reported that his dysarthria had been present possibly as much as 12 hours ago. I still discussed the patient with Dr. Thad Ranger from neurology. She agreed that the patient would not be a TPA candidate based on this new finding. Patient  had appropriate stroke workup. Patient's renal function was at baseline. Troponin was negative. Patient had nonspecific EKG changes in his T waves. CBC showed no evidence of leukocytosis and no anemia. Fingerstick was within normal limits. Head CT had a vague area of low-attenuation along the right internal capsule that could represent infarct. I discussed this with Dr. Carlota Raspberry who was the reading radiologist. Patient had MR performed to differentiate this further. Result is pending at this time. Patient remained hemodynamically stable. Care will be signed out to oncoming physician at this time.        Cyndra Numbers, MD 01/24/12 [phone]

## 2012-01-24 NOTE — H&P (Addendum)
PCP:  Laurena Slimmer, MD    Primary cardiologist : Verdis Prime, MD  Primary nephrologist : Dr Caryn Section   DOA:  01/24/2012  1:22 PM  Chief Complaint:  Slurry speech with leg weakness since 1 day  HPI: 78 y/o AA male with hx of HTN, DM on oral hypoglycemic , ESRD s/p renal transplant at [facility] in march 2012, hx of duodenal ulcer, prostate adeno ca, Hx of NSTEMI with afib during HD in feb 2012 with cardiac cath showing non obstructive coronaries and normal EF ( was on amiodarone briefly and discharged on aspirin alone after discussion with renal ) presented to ED with slurry speech and bilateral lower extremity weakness since this morning. Patient was sitting at home when he had this symptoms with slurry speech and word finding difficulty. Also had significant weakness of his bilalteral LE. He has chronic weakness of his RLE. He denies any chest pain, SOB, palpitations, headache, dizziness, nausea, blurry vision, vomiting, abdominal pain, bowel or urinary symptoms. Denies fever, chills , diaphoresis, recent change in medications.  patient had head CT in ED which showed a Vague area of low attenuation along the posterior limb of the right internal capsule which could represent a small infarct. An MRI brain done showed a left pontine infarct. patient was out of window for tPA so did not receive it. [facility] hospitalist called for admission for acute stroke.   Allergies: No Known Allergies  Prior to Admission medications   Medication Sig Start Date End Date Taking? Authorizing Provider  acetaminophen (TYLENOL) 500 MG tablet Take 1,000 mg by mouth every 6 (six) hours as needed. For pain   Yes Historical Provider, MD  aspirin EC 81 MG tablet Take 81 mg by mouth daily.   Yes Historical Provider, MD  diltiazem (TIAZAC) 300 MG 24 hr capsule Take 300 mg by mouth daily.   Yes Historical Provider, MD  furosemide (LASIX) 20 MG tablet Take 10 mg by mouth daily.   Yes Historical Provider, MD  glipiZIDE  (GLUCOTROL) 5 MG tablet Take 5 mg by mouth 2 (two) times daily before a meal.   Yes Historical Provider, MD  hydrALAZINE (APRESOLINE) 25 MG tablet Take 25 mg by mouth 2 (two) times daily.   Yes Historical Provider, MD  megestrol (MEGACE) 400 MG/10ML suspension Take 400 mg by mouth daily as needed.   Yes Historical Provider, MD  mycophenolate (MYFORTIC) 180 MG EC tablet Take 180 mg by mouth 2 (two) times daily.   Yes Historical Provider, MD  omeprazole (PRILOSEC) 40 MG capsule Take 40 mg by mouth 2 (two) times daily.   Yes Historical Provider, MD  polyethylene glycol (MIRALAX / GLYCOLAX) packet Take 17 g by mouth daily.   Yes Historical Provider, MD  sulfamethoxazole-trimethoprim (BACTRIM,SEPTRA) 400-80 MG per tablet Take 1 tablet by mouth every Monday, Wednesday, and Friday.   Yes Historical Provider, MD  tacrolimus (PROGRAF) 1 MG capsule Take 4-5 mg by mouth See admin instructions. Take 4 capsules with breakfast and 5 capsules with dinner   Yes Historical Provider, MD    Past Medical History  Diagnosis Date  . H/O kidney transplant   . Diabetes mellitus   . Cancer     hx prostate cancer  HTN Hx of duodenal ulcer Hx of NSTEMI with Afib in 2012 s/p cath with   Past Surgical History  Procedure Date  . Kidney transplant   . Prostate surgery approx. 2003    seed implant    Social History:  reports that he quit smoking about 29 years ago. His smoking use included Cigarettes. He quit after 15 years of use. He has never used smokeless tobacco. He reports that he does not drink alcohol or use illicit drugs.  History reviewed. No family history for HTN, DM , CVA or CAD  Review of Systems:  Constitutional: Denies fever, chills, diaphoresis, appetite change and fatigue.  HEENT: Denies photophobia, eye pain, redness, hearing loss, ear pain, congestion, sore throat, rhinorrhea, sneezing, mouth sores, trouble swallowing, neck pain, neck stiffness and tinnitus.   Respiratory: Denies SOB, DOE,  cough, chest tightness,  and wheezing.   Cardiovascular: Denies chest pain, palpitations and leg swelling.  Gastrointestinal: Denies nausea, vomiting, abdominal pain, diarrhea, constipation, blood in stool and abdominal distention.  Genitourinary: Denies dysuria, urgency, frequency, hematuria, flank pain and difficulty urinating.  Musculoskeletal: Denies myalgias, back pain, joint swelling, arthralgias and gait problem.  Skin: Denies pallor, rash and wound.  Neurological: weakness of bilateral lower extremities, slurry speech ,  Denies dizziness, seizures, syncope,light-headedness, numbness and headaches.  Hematological: Denies adenopathy. Easy bruising, personal or family bleeding history  Psychiatric/Behavioral: Denies suicidal ideation, mood changes, confusion, nervousness, sleep disturbance and agitation   Physical Exam:  Filed Vitals:   01/24/12 1445 01/24/12 1515 01/24/12 1728 01/24/12 1800  BP: 139/54 136/47 124/59   Pulse: 62 62 75   Temp:    98.8 F (37.1 C)  TempSrc:    Oral  Resp:   18   SpO2: 100% 100% 100%     Constitutional: Vital signs reviewed.  Patient is a well-developed and well-nourished in no acute distress and cooperative with exam. Alert and oriented x3.  Head: Normocephalic and atraumatic Ear: TM normal bilaterally Mouth: no erythema or exudates, MMM Eyes: PERRL, EOMI, conjunctivae normal, No scleral icterus.  Neck: Supple, Trachea midline normal ROM, No JVD, mass, thyromegaly, or carotid bruit present.  Cardiovascular: RRR, S1 normal, S2 normal, no MRG, pulses symmetric and intact bilaterally Pulmonary/Chest: CTAB, no wheezes, rales, or rhonchi Abdominal: Soft. Non-tender, non-distended, bowel sounds are normal, no masses, organomegaly, or guarding present.  GU: no CVA tenderness Musculoskeletal: No joint deformities, erythema, or stiffness, ROM full and no nontender Ext: no edema and no cyanosis, pulses palpable bilaterally (DP and PT) Hematology: no  cervical, inginal, or axillary adenopathy.  Neurological: A&O x3, slurry speech with some word finding difficulty, left facial droop, Strenght is normal and symmetric bilaterally over upper extremity, power 4/5 in bilateral LE, plantar downgoing bilaterally, cranial nerve II-XII are grossly intact except for left facial droop, no focal motor deficit, sensory intact to light touch bilaterally.  Skin: Warm, dry and intact. No rash, cyanosis, or clubbing.  Psychiatric: Normal mood and affect. speech and behavior is normal. Judgment and thought content normal. Cognition and memory are normal.   Labs on Admission:  Results for orders placed during the hospital encounter of 01/24/12 (from the past 48 hour(s))  PROTIME-INR     Status: Normal   Collection Time   01/24/12  1:56 PM      Component Value Range Comment   Prothrombin Time 14.1  11.6 - 15.2 seconds    INR 1.07  0.00 - 1.49   APTT     Status: Abnormal   Collection Time   01/24/12  1:56 PM      Component Value Range Comment   aPTT 47 (*) 24 - 37 seconds   CBC     Status: Abnormal   Collection Time   01/24/12  1:56 PM      Component Value Range Comment   WBC 6.7  4.0 - 10.5 K/uL    RBC 4.02 (*) 4.22 - 5.81 MIL/uL    Hemoglobin 11.2 (*) 13.0 - 17.0 g/dL    HCT 95.6 (*) 21.3 - 52.0 %    MCV 81.3  78.0 - 100.0 fL    MCH 27.9  26.0 - 34.0 pg    MCHC 34.3  30.0 - 36.0 g/dL    RDW 08.6  57.8 - 46.9 %    Platelets 187  150 - 400 K/uL   DIFFERENTIAL     Status: Normal   Collection Time   01/24/12  1:56 PM      Component Value Range Comment   Neutrophils Relative 77  43 - 77 %    Neutro Abs 5.2  1.7 - 7.7 K/uL    Lymphocytes Relative 18  12 - 46 %    Lymphs Abs 1.2  0.7 - 4.0 K/uL    Monocytes Relative 4  3 - 12 %    Monocytes Absolute 0.3  0.1 - 1.0 K/uL    Eosinophils Relative 1  0 - 5 %    Eosinophils Absolute 0.1  0.0 - 0.7 K/uL    Basophils Relative 0  0 - 1 %    Basophils Absolute 0.0  0.0 - 0.1 K/uL   COMPREHENSIVE METABOLIC  PANEL     Status: Abnormal   Collection Time   01/24/12  1:56 PM      Component Value Range Comment   Sodium 135  135 - 145 mEq/L    Potassium 4.9  3.5 - 5.1 mEq/L    Chloride 104  96 - 112 mEq/L    CO2 20  19 - 32 mEq/L    Glucose, Bld 109 (*) 70 - 99 mg/dL    BUN 32 (*) 6 - 23 mg/dL    Creatinine, Ser 6.29 (*) 0.50 - 1.35 mg/dL    Calcium 9.3  8.4 - 52.8 mg/dL    Total Protein 7.5  6.0 - 8.3 g/dL    Albumin 4.3  3.5 - 5.2 g/dL    AST 18  0 - 37 U/L    ALT 20  0 - 53 U/L    Alkaline Phosphatase 50  39 - 117 U/L    Total Bilirubin 0.3  0.3 - 1.2 mg/dL    GFR calc non Af Amer 40 (*) >90 mL/min    GFR calc Af Amer 46 (*) >90 mL/min   CK TOTAL AND CKMB     Status: Normal   Collection Time   01/24/12  1:56 PM      Component Value Range Comment   Total CK 94  7 - 232 U/L    CK, MB 3.1  0.3 - 4.0 ng/mL    Relative Index RELATIVE INDEX IS INVALID  0.0 - 2.5   TROPONIN I     Status: Normal   Collection Time   01/24/12  1:56 PM      Component Value Range Comment   Troponin I <0.30  <0.30 ng/mL   GLUCOSE, CAPILLARY     Status: Abnormal   Collection Time   01/24/12  2:09 PM      Component Value Range Comment   Glucose-Capillary 116 (*) 70 - 99 mg/dL    Comment 1 Notify RN     POCT I-STAT, CHEM 8     Status: Abnormal   Collection Time  01/24/12  2:23 PM      Component Value Range Comment   Sodium 139  135 - 145 mEq/L    Potassium 4.9  3.5 - 5.1 mEq/L    Chloride 110  96 - 112 mEq/L    BUN 34 (*) 6 - 23 mg/dL    Creatinine, Ser 4.54 (*) 0.50 - 1.35 mg/dL    Glucose, Bld 098 (*) 70 - 99 mg/dL    Calcium, Ion 1.19  1.47 - 1.32 mmol/L    TCO2 18  0 - 100 mmol/L    Hemoglobin 13.3  13.0 - 17.0 g/dL    HCT 82.9  56.2 - 13.0 %     Radiological Exams on Admission: Clinical Data: Weakness. Diabetic. History prostate cancer. End-  stage renal disease. Diabetic.  MRI HEAD WITHOUT CONTRAST  Technique: Multiplanar, multiecho pulse sequences of the brain and  surrounding structures were  obtained according to standard protocol  without intravenous contrast.  Comparison: 01/24/2012 CT.  Findings: Non hemorrhagic acute left paracentral pontine infarct.  Remote small right paracentral pontine infarct.  Mild small vessel disease type changes.  White matter changes occipital lobes with a symmetric appearance.  This probably represents result of small vessel disease. In this  patient with history of end-stage renal disease, mild posterior  reversible encephalopathy syndrome changes difficult to completely  exclude.  Global atrophy without hydrocephalus.  No intracranial mass lesion detected on this unenhanced exam.  No intracranial hemorrhage.  Cervical spondylotic changes. Motion limits evaluation.  Altered signal intensity of bone marrow may be related to result of  anemia from end-stage renal disease. Prostate metastatic lesions  not entirely excluded.  Cerebellar tonsils minimally low-lying but within range normal  limits.  Partial opacification right mastoid air cells without obstructing  lesion noted in the region of the posterior-superior nasopharynx.  Minimal to mild ethmoid sinus air cell mucosal thickening.  Major intracranial vascular structures are patent with small  vertebral arteries and basilar artery. Atherosclerotic type  changes of the vertebral artery suspected.  IMPRESSION:  Non hemorrhagic acute left paracentral pontine infarct.  Remote small right paracentral pontine infarct.  Mild small vessel disease type changes.  White matter changes occipital lobes with a symmetric appearance.  This probably represents result of small vessel disease. In this  patient with history of end-stage renal disease, mild posterior  reversible encephalopathy syndrome changes difficult to completely  exclude.  Global atrophy without hydrocephalus.  Altered signal intensity of bone marrow may be related to result of  anemia from end-stage renal disease. Prostate  metastatic lesions  not entirely excluded.  Partial opacification right mastoid air cells.  Minimal to mild ethmoid sinus air cell mucosal thickening.  Major intracranial vascular structures are patent with small  vertebral arteries and basilar artery. Atherosclerotic type  changes of the vertebral artery suspected.  Critical Value/emergent results were called by telephone at the  time of interpretation on 01/24/2012 at 4:45 p.m. to Dr. Alto Denver,  who verbally acknowledged these results.   Assessment/Plan  Acute left pontine infarct patient has residual left facial droop , slurry speech and some lower extremity weakness with gait abnormality. Admit to telemetry Check EKG shows 1st degree AV block and TWI in v5-v6 (no other EKG in system). Will monitor in tele -appreciate neurology consult -patient on baby aspirin at home, switched to plavix per neurology recommendations -2D echo, carotid doppler ordered.  -risk factors include hx of smoking, DM and previous hx of Afib for which he is  on baby aspirin and was briefly on amiodarone until 1 year back. -speech and swallow eval -PT/OT eval -check A1C, and lipid panel -allow permissive Hypertension     Diabetes mellitus type II Hold oral hypoglycemics for now Check A1C, maintain on SSI   History of renal transplant Continue immunosuppressants  monitor renal fn Dr Deterding informed about patient's admission. Will follow in am    H/O: duodenal ulcer Continue PPI   H/O atrial fibrillation without current medication Had hx of NSTEMI in Feb 2012 with a fib while in Dialysis with non obstructed coronaries on cardiac cath by Dr Katrinka Blazing. Was discharged on ASA and palced on amiodarone for some time. Monitor in tele  follow echo  DVT prophylaxis  sq lovenox  Diet: NPO until swallow eval bedside  Full code   Time Spent on Admission: 70 MINUTES  Lennie Vasco 01/24/2012, 6:18 PM

## 2012-01-24 NOTE — ED Notes (Signed)
Pt. returned from XR. 

## 2012-01-25 ENCOUNTER — Encounter (HOSPITAL_COMMUNITY): Payer: Self-pay | Admitting: Nephrology

## 2012-01-25 DIAGNOSIS — I10 Essential (primary) hypertension: Secondary | ICD-10-CM

## 2012-01-25 DIAGNOSIS — I6340 Cerebral infarction due to embolism of unspecified cerebral artery: Secondary | ICD-10-CM

## 2012-01-25 DIAGNOSIS — E119 Type 2 diabetes mellitus without complications: Secondary | ICD-10-CM

## 2012-01-25 DIAGNOSIS — G459 Transient cerebral ischemic attack, unspecified: Secondary | ICD-10-CM

## 2012-01-25 DIAGNOSIS — R4789 Other speech disturbances: Secondary | ICD-10-CM

## 2012-01-25 DIAGNOSIS — I6330 Cerebral infarction due to thrombosis of unspecified cerebral artery: Secondary | ICD-10-CM

## 2012-01-25 DIAGNOSIS — Z940 Kidney transplant status: Secondary | ICD-10-CM

## 2012-01-25 LAB — RAPID URINE DRUG SCREEN, HOSP PERFORMED
Cocaine: NOT DETECTED
Opiates: NOT DETECTED
Tetrahydrocannabinol: NOT DETECTED

## 2012-01-25 LAB — BASIC METABOLIC PANEL
CO2: 21 mEq/L (ref 19–32)
Calcium: 8.5 mg/dL (ref 8.4–10.5)
GFR calc Af Amer: 47 mL/min — ABNORMAL LOW (ref >90)
GFR calc non Af Amer: 41 mL/min — ABNORMAL LOW (ref >90)
Sodium: 137 mEq/L (ref 135–145)

## 2012-01-25 LAB — LIPID PANEL
HDL: 35 mg/dL — ABNORMAL LOW (ref >39)
LDL Cholesterol: 62 mg/dL (ref 0–99)
Total CHOL/HDL Ratio: 3.3 RATIO
VLDL: 18 mg/dL (ref 0–40)

## 2012-01-25 LAB — URINALYSIS, ROUTINE W REFLEX MICROSCOPIC
Bilirubin Urine: NEGATIVE
Leukocytes, UA: NEGATIVE
Nitrite: NEGATIVE
Specific Gravity, Urine: 1.011 (ref 1.005–1.030)
pH: 6.5 (ref 5.0–8.0)

## 2012-01-25 LAB — GLUCOSE, CAPILLARY
Glucose-Capillary: 215 mg/dL — ABNORMAL HIGH (ref 70–99)
Glucose-Capillary: 89 mg/dL (ref 70–99)
Glucose-Capillary: 103 mg/dL — ABNORMAL HIGH (ref 70–99)

## 2012-01-25 LAB — HEMOGLOBIN A1C: Hgb A1c MFr Bld: 5.8 % — ABNORMAL HIGH (ref <5.7)

## 2012-01-25 MED ORDER — TACROLIMUS 1 MG PO CAPS
5.0000 mg | ORAL_CAPSULE | Freq: Two times a day (BID) | ORAL | Status: DC
  Administered 2012-01-25 – 2012-01-28 (×6): 5 mg via ORAL
  Filled 2012-01-25 (×9): qty 5

## 2012-01-25 NOTE — Evaluation (Signed)
Physical Therapy Evaluation Patient Details Name: Evan [name] MRN: [id number] DOB: 02/26/1932 Today's Date: 01/25/2012 Time: 0865-7846 PT Time Calculation (min): 44 min  PT Assessment / Plan / Recommendation Clinical Impression  Pt admitted for L pontine CVA, NIHS = 1. Pt presents with bil LE weakness R>L, slurred speech, and decreased functional mobility. PMH includes ESRD s/p transplants and NSTEMI. Pt will benefit from skilled PT to address below impairments and reduce falls risk and caregiver burden at next venue of care.     PT Assessment  Patient needs continued PT services    Follow Up Recommendations  Inpatient Rehab;Supervision/Assistance - 24 hour    Barriers to Discharge Inaccessible home environment;Decreased caregiver support Wife is likely only able to provide S    lEquipment Recommendations  Defer to next venue    Recommendations for Other Services Rehab consult   Frequency Min 5X/week    Precautions / Restrictions Precautions Precautions: Fall Precaution Comments: CVA Restrictions Weight Bearing Restrictions: No   Pertinent Vitals/Pain No complaints      Mobility  Bed Mobility Bed Mobility: Rolling Left;Left Sidelying to Sit;Sitting - Scoot to Edge of Bed Rolling Left: 4: Min assist Left Sidelying to Sit: 3: Mod assist;HOB flat;With rails Sitting - Scoot to Edge of Bed: 4: Min assist Details for Bed Mobility Assistance: Cues for initiation, sequence, technique, and hand placement. Assist for trunk lifting and advancing hips with pad.  Transfers Transfers: Sit to Stand;Stand to Sit Sit to Stand: 3: Mod assist;With upper extremity assist;From bed Stand to Sit: 4: Min assist;With upper extremity assist;With armrests;To chair/3-in-1 Details for Transfer Assistance: Cues for hand placement. Assist needed for anterior translation over BOS and lifting.  Ambulation/Gait Ambulation/Gait Assistance: 3: Mod assist Ambulation Distance (Feet): 30 Feet Assistive  device: Rolling walker Ambulation/Gait Assistance Details: Assist for balance and maintaining centered in RW as pt tends to lean R in RW. Cues needed for R foot clearance and positioning in RW. Pt able to adjust with cues, but not maintain. Gait quality declined with fatigue. Decreased R knee ext in swing and stance noted.  Gait Pattern: Step-through pattern;Decreased stride length;Shuffle;Lateral trunk lean to right;Right flexed knee in stance;Trunk flexed General Gait Details: Chair to follow Stairs: No Wheelchair Mobility Wheelchair Mobility: No Modified Rankin (Stroke Patients Only) Pre-Morbid Rankin Score: No symptoms Modified Rankin: Moderately severe disability    Exercises     PT Diagnosis: Abnormality of gait;Generalized weakness;Hemiplegia dominant side  PT Problem List: Decreased range of motion;Decreased strength;Decreased activity tolerance;Decreased balance;Decreased mobility;Decreased coordination;Decreased knowledge of use of DME;Decreased safety awareness;Decreased knowledge of precautions PT Treatment Interventions: DME instruction;Gait training;Stair training;Functional mobility training;Therapeutic activities;Therapeutic exercise;Balance training;Neuromuscular re-education;Cognitive remediation;Patient/family education;Wheelchair mobility training   PT Goals Acute Rehab PT Goals PT Goal Formulation: With patient/family Time For Goal Achievement: 02/08/12 Potential to Achieve Goals: Good Pt will Roll Supine to Left Side: with supervision PT Goal: Rolling Supine to Left Side - Progress: Goal set today Pt will go Supine/Side to Sit: with supervision;with HOB 0 degrees PT Goal: Supine/Side to Sit - Progress: Goal set today Pt will go Sit to Supine/Side: with supervision;with HOB 0 degrees PT Goal: Sit to Supine/Side - Progress: Goal set today Pt will go Sit to Stand: with supervision;with upper extremity assist PT Goal: Sit to Stand - Progress: Goal set today Pt will go  Stand to Sit: with supervision;with upper extremity assist PT Goal: Stand to Sit - Progress: Goal set today Pt will Ambulate: 51 - 150 feet;with supervision;with least restrictive assistive device  PT Goal: Ambulate - Progress: Goal set today Pt will Go Up / Down Stairs: 3-5 stairs;with min assist;with rail(s);with least restrictive assistive device PT Goal: Up/Down Stairs - Progress: Goal set today  Visit Information  Last PT Received On: 01/25/12 Assistance Needed: +2 (safety)    Subjective Data  Subjective: I need help Patient Stated Goal: Return to independence   Prior Functioning  Home Living Lives With: Spouse Available Help at Discharge: Family;Available 24 hours/day Type of Home: House Home Access: Stairs to enter [employer] of Steps: 3 Entrance Stairs-Rails: Left Home Layout: One level Home Adaptive Equipment: Straight cane;Walker - rolling Prior Function Level of Independence: Independent with assistive device(s) Able to Take Stairs?: Yes Driving: No Communication Communication: No difficulties    Cognition  Overall Cognitive Status: Appears within functional limits for tasks assessed/performed Arousal/Alertness: Awake/alert Orientation Level: Appears intact for tasks assessed Behavior During Session: [facility] for tasks performed Cognition - Other Comments: Slow speech, but appears cognitively inctact. Does not appreat to have neglect or visual deficits.     Extremity/Trunk Assessment Right Upper Extremity Assessment RUE ROM/Strength/Tone: Deficits (Defer to OT eval, but decreased grip noted) Left Upper Extremity Assessment LUE ROM/Strength/Tone: Within functional levels (Defer to OT eval) Right Lower Extremity Assessment RLE ROM/Strength/Tone: Deficits RLE ROM/Strength/Tone Deficits: Grossly 3+/5 throughout RLE Sensation: WFL - Light Touch RLE Coordination: Deficits RLE Coordination Deficits: Decreaed heel shin slide Left Lower Extremity  Assessment LLE ROM/Strength/Tone: WFL for tasks assessed LLE Sensation: WFL - Light Touch LLE Coordination: WFL - gross/fine motor Trunk Assessment Trunk Assessment: Kyphotic   Balance Balance Balance Assessed: Yes Static Sitting Balance Static Sitting - Balance Support: Bilateral upper extremity supported;Feet supported Static Sitting - Level of Assistance: 5: Stand by assistance Static Sitting - Comment/# of Minutes: Close SBA due to R trunk lean in sitting x Dynamic Sitting Balance Dynamic Sitting - Balance Support: Right upper extremity supported;Left upper extremity supported;Feet supported Dynamic Sitting - Level of Assistance: 4: Min assist Dynamic Sitting - Balance Activities: Lateral lean/weight shifting;Forward lean/weight shifting;Reaching for objects;Reaching across midline Static Standing Balance Static Standing - Balance Support: Left upper extremity supported;Right upper extremity supported Static Standing - Level of Assistance: 4: Min assist Static Standing - Comment/# of Minutes: Standing while donning depends Dynamic Standing Balance Dynamic Standing - Balance Support: Bilateral upper extremity supported Dynamic Standing - Level of Assistance: 3: Mod assist Dynamic Standing - Balance Activities: Lateral lean/weight shifting;Forward lean/weight shifting;Reaching for objects Dynamic Standing - Comments: Decreased trunk extension  End of Session PT - End of Session Equipment Utilized During Treatment: Gait belt Activity Tolerance: Patient limited by fatigue Patient left: in chair;with call bell/phone within reach;with family/visitor present Nurse Communication: Mobility status   Virl Cagey, PT 01/25/2012, 10:03 AM

## 2012-01-25 NOTE — Consult Note (Signed)
Evan [name] 01/25/2012 Evan [name] D Requesting Physician:  Dr. Gonzella Lex  Reason for Consult:  Renal transplant pt with acute CVA HPI: The patient is a 78 y.o. year-old w hx of HTN, DM, ESRD with renal Tx Mar 2012, prost Ca, presented yest 6/20 with slurred speech and leg weakness less than 24 hr duration. MRI showed pontine acute stroke. Was outside of TPA window, admitted yesterday.  Creat 1.74 yest, 1.58 today.  Tacrolimus level from 6/18 was 6.5 ng/mL. Patient has slurred speech, otherwise no complaints.   ESRD started HD in 2011, transplant (double-kidney) cadaveric tx in 2012  ROS  No fever, sweats wt loss  No n/v diarrhea  No CP or SOB  arthritis low back, saw dr. Reita Cliche, dx spinal stenosis  no rash  no HA blurred vision  +hearing aids  +partial dentures  RA seed Rx for pros ca in 2006, recent f/u Nesi PSA nl, no sign recurrence   Past Medical History:  Past Medical History  Diagnosis Date  . H/O kidney transplant   . Diabetes mellitus   . Cancer     hx prostate cancer  . ESRD (end stage renal disease) 2011    hemodialysis started Sept 2011, cadaveric (double) renal  transplant Mar 2012 at [facility]    Past Surgical History:  Past Surgical History  Procedure Date  . Kidney transplant   . Prostate surgery approx. 2003    seed implant    Family History: History reviewed. No pertinent family history. Social History:  reports that he quit smoking about 29 years ago. His smoking use included Cigarettes. He quit after 15 years of use. He has never used smokeless tobacco. He reports that he does not drink alcohol or use illicit drugs.  Allergies: No Known Allergies  Home medications: Prior to Admission medications   Medication Sig Start Date End Date Taking? Authorizing Provider  acetaminophen (TYLENOL) 500 MG tablet Take 1,000 mg by mouth every 6 (six) hours as needed. For pain   Yes Historical Provider, MD  aspirin EC 81 MG tablet Take 81 mg by mouth daily.   Yes  Historical Provider, MD  diltiazem (TIAZAC) 300 MG 24 hr capsule Take 300 mg by mouth daily.   Yes Historical Provider, MD  furosemide (LASIX) 20 MG tablet Take 10 mg by mouth daily.   Yes Historical Provider, MD  glipiZIDE (GLUCOTROL) 5 MG tablet Take 5 mg by mouth 2 (two) times daily before a meal.   Yes Historical Provider, MD  hydrALAZINE (APRESOLINE) 25 MG tablet Take 25 mg by mouth 2 (two) times daily.   Yes Historical Provider, MD  megestrol (MEGACE) 400 MG/10ML suspension Take 400 mg by mouth daily as needed.   Yes Historical Provider, MD  mycophenolate (MYFORTIC) 180 MG EC tablet Take 180 mg by mouth 2 (two) times daily.   Yes Historical Provider, MD  omeprazole (PRILOSEC) 40 MG capsule Take 40 mg by mouth 2 (two) times daily.   Yes Historical Provider, MD  polyethylene glycol (MIRALAX / GLYCOLAX) packet Take 17 g by mouth daily.   Yes Historical Provider, MD  sulfamethoxazole-trimethoprim (BACTRIM,SEPTRA) 400-80 MG per tablet Take 1 tablet by mouth every Monday, Wednesday, and Friday.   Yes Historical Provider, MD  tacrolimus (PROGRAF) 1 MG capsule Take 5 mg by mouth 2 (two) times daily.   Yes Historical Provider, MD    Inpatient medications:    . sodium chloride   Intravenous STAT  . clopidogrel  75 mg Oral Q breakfast  .  enoxaparin  40 mg Subcutaneous Q24H  . insulin aspart  0-15 Units Subcutaneous TID WC  . LORazepam  1 mg Intravenous Once  . mycophenolate  180 mg Oral BID  . pantoprazole  40 mg Oral Q1200  . polyethylene glycol  17 g Oral Daily  . sodium chloride  3 mL Intravenous Q12H  . sodium chloride  3 mL Intravenous Q12H  . sulfamethoxazole-trimethoprim  1 tablet Oral 3 times weekly  . tacrolimus  4 mg Oral Q breakfast  . tacrolimus  5 mg Oral Q supper  . DISCONTD: enoxaparin  40 mg Subcutaneous Q24H  . DISCONTD: megestrol  400 mg Oral Daily  . DISCONTD: tacrolimus  4-5 mg Oral See admin instructions   Labs: Basic Metabolic Panel:  Lab 01/25/12 4098 01/24/12  1423 01/24/12 1356 01/22/12 1002  NA 137 139 135 139  K 4.8 4.9 4.9 5.4*  CL 108 110 104 107  CO2 21 -- 20 22  GLUCOSE 152* 105* 109* 103*  BUN 31* 34* 32* 37*  CREATININE 1.58* 1.70* 1.60* 1.74*  ALB -- -- -- --  CALCIUM 8.5 -- 9.3 9.4  PHOS -- -- -- --   Liver Function Tests:  Lab 01/24/12 1356 01/22/12 1002  AST 18 18  ALT 20 20  ALKPHOS 50 53  BILITOT 0.3 0.3  PROT 7.5 7.4  ALBUMIN 4.3 4.1   No results found for this basename: LIPASE:3,AMYLASE:3 in the last 168 hours No results found for this basename: AMMONIA:3 in the last 168 hours CBC:  Lab 01/24/12 1423 01/24/12 1356 01/22/12 1002  WBC -- 6.7 6.4  NEUTROABS -- 5.2 --  HGB 13.3 11.2* 10.7*  HCT 39.0 32.7* 31.7*  MCV -- 81.3 81.5  PLT -- 187 170   PT/INR: @labrcntip (inr:5) Cardiac Enzymes:  Lab 01/24/12 1356  CKTOTAL 94  CKMB 3.1  CKMBINDEX --  TROPONINI <0.30   CBG:  Lab 01/25/12 1217 01/25/12 0748 01/24/12 2209 01/24/12 1409  GLUCAP 103* 89 215* 116*    Iron Studies:  Lab 01/22/12 1002  IRON 155*  TIBC 201*  TRANSFERRIN --  FERRITIN 992*      Physical Exam:  Blood pressure 138/65, pulse 68, temperature 97.6 F (36.4 C), temperature source Oral, resp. rate 18, height 5' 10.87" (1.8 m), weight 76 kg (167 lb 8.8 oz), SpO2 99.00%.  Gen: alert, up in chair Skin: no rash, cyanosis HEENT:  EOMI, sclera anicteric, throat clear Neck: no JVD, no bruits or LAN Chest: clear bilat Heart: regular, difficult to hear due to AVF transmitted bruit from L arm Abdomen: soft, nontender, transplant scar RLQ, no ascites, no HSM Ext: no LE edema, AVF LUA with strong bruit Neuro: alert, Ox3, no focal deficit, bilat LE weakness, slurred speech Heme/Lymph: no bruising or LAN  Impression/Plan 1. ESRD / Renal transplant - will get office records, looks like baseline creat was 1.5 in May 2013.  It is 1.58 today. Continue usual transplant meds which are prograf 5 mg bid (recently increased from 4am/5pm) and  myfortic 180 bid. Agree with mgmnt. Avoid nephrotoxins or IV contrast.   A recent prograf level 6/18 was therapeutic.  2. Acute stroke, pontine - slurred speech and LE weakness. Per primary 3. DM2 - on oral agents 4. HTN - continue usual BP meds as BP tolerates (hydralazine 25bid, lasix 10/d, tiazac 300/d).   5. Hx pros Ca - treated w brachytherapy 2006  Vinson Moselle  MD [facility] [phone] pgr    [phone] cell  01/25/2012, 1:23 PM

## 2012-01-25 NOTE — Evaluation (Signed)
Clinical/Bedside Swallow Evaluation and Speech/Language Evaluation (see below) Patient Details  Name: Evan [name] MRN: [id number] Date of Birth: 27-Jun-1932  Today's Date: 01/25/2012 Time: 2130-8657 SLP Time Calculation (min): 42 min  Past Medical History:  Past Medical History  Diagnosis Date  . H/O kidney transplant   . Diabetes mellitus   . Cancer     hx prostate cancer   Past Surgical History:  Past Surgical History  Procedure Date  . Kidney transplant   . Prostate surgery approx. 2003    seed implant   HPI:  78 y/o AA male with hx of HTN, DM on oral hypoglycemic , ESRD s/p renal transplant at [facility] in march 2012, hx of duodenal ulcer, prostate adeno ca, Hx of NSTEMI with afib during HD in feb 2012 with cardiac cath showing non obstructive coronaries and normal EF ( was on amiodarone briefly and discharged on aspirin alone after discussion with renal ) presented to ED with slurry speech and bilateral lower extremity weakness since this morning. Patient was sitting at home when he had this symptoms with slurry speech and word finding difficulty. Also had significant weakness of his bilalteral LE. He has chronic weakness of his RLE. He denies any chest pain, SOB, palpitations, headache, dizziness, nausea, blurry vision, vomiting, abdominal pain, bowel or urinary symptoms. Denies fever, chills , diaphoresis, recent change in medications.  MRI shows remote right pontine infarct, new left hemorrhagic pontine infarct. Pt passed RN stroke swallow, on a regular diet with thin liquids. SLP requested BSE given pontine CVA.    Assessment / Plan / Recommendation Clinical Impression  Pt presents with no s/s of aspiration, adequate timing and strength of swallow response. Mild oral dysphagia with mild residuals in right buccal cavity and reports of some anterior spillage on right, though not observed during this visit. SLP provided aspiration precautions, suggested pt continue using straws  due to oral dysphagia though with small single sips with a dysphagia 3 (mech soft) diet and thin liquids. Educated pt and family of s/s of aspiration and provided compensatory strategies for oral residue. Given pontine CVA and high risk of silent aspiraiton, suggest f/u to check for diet tolerance.     Aspiration Risk  Mild    Diet Recommendation Dysphagia 3 (Mechanical Soft);Thin liquid   Liquid Administration via: Cup;Straw Medication Administration: Whole meds with liquid Supervision: Patient able to self feed;Intermittent supervision to cue for compensatory strategies Compensations: Slow rate;Small sips/bites;Check for pocketing (right lingual sweep) Postural Changes and/or Swallow Maneuvers: Seated upright 90 degrees;Upright 30-60 min after meal    Other  Recommendations Oral Care Recommendations: Oral care BID;Patient independent with oral care   Follow Up Recommendations  Inpatient Rehab    Frequency and Duration min 1 x/week  1 week   Pertinent Vitals/Pain NA    SLP Swallow Goals Patient will consume recommended diet without observed clinical signs of aspiration with: Independent assistance Patient will utilize recommended strategies during swallow to increase swallowing safety with: Independent assistance   Swallow Study Prior Functional Status  Cognitive/Linguistic Baseline: Within functional limits Type of Home: House Lives With: Spouse Available Help at Discharge: Family;Available 24 hours/day Vocation: Retired    Radio producer HPI: 78 y/o AA male with hx of HTN, DM on oral hypoglycemic , ESRD s/p renal transplant at [facility] in march 2012, hx of duodenal ulcer, prostate adeno ca, Hx of NSTEMI with afib during HD in feb 2012 with cardiac cath showing non obstructive coronaries and normal EF ( was  on amiodarone briefly and discharged on aspirin alone after discussion with renal ) presented to ED with slurry speech and bilateral lower extremity weakness since this morning.  Patient was sitting at home when he had this symptoms with slurry speech and word finding difficulty. Also had significant weakness of his bilalteral LE. He has chronic weakness of his RLE. He denies any chest pain, SOB, palpitations, headache, dizziness, nausea, blurry vision, vomiting, abdominal pain, bowel or urinary symptoms. Denies fever, chills , diaphoresis, recent change in medications.  MRI shows remote right pontine infarct, new left hemorrhagic pontine infarct. Pt passed RN stroke swallow, on a regular diet with thin liquids. SLP requested BSE given pontine CVA.  Type of Study: Bedside swallow evaluation Diet Prior to this Study: Regular;Thin liquids Temperature Spikes Noted: No Respiratory Status: Room air History of Recent Intubation: No Behavior/Cognition: Alert;Cooperative;Pleasant mood Oral Cavity - Dentition: Adequate natural dentition Self-Feeding Abilities: Able to feed self Patient Positioning: Upright in chair Baseline Vocal Quality: Clear Volitional Cough: Strong Volitional Swallow: Able to elicit    Oral/Motor/Sensory Function Overall Oral Motor/Sensory Function:  (Pt with possible mild left sided weakness at baseline) Labial ROM: Reduced left;Reduced right Labial Symmetry: Abnormal symmetry right Labial Strength: Reduced Labial Sensation: Reduced Lingual ROM: Reduced right;Reduced left Lingual Symmetry: Abnormal symmetry right;Abnormal symmetry left Lingual Strength: Reduced Lingual Sensation: Reduced Facial ROM: Reduced right Facial Symmetry: Right droop;Right drooping eyelid Facial Strength: Reduced Facial Sensation: Reduced Velum: Within Functional Limits Mandible: Within Functional Limits   Ice Chips Ice chips: Not tested   Thin Liquid Thin Liquid: Within functional limits Presentation: Cup;Straw;Self Fed    Nectar Thick Nectar Thick Liquid: Not tested   Honey Thick Honey Thick Liquid: Not tested   Puree Puree: Not tested   Solid Solid:  Impaired Presentation: Self Fed Oral Phase Impairments: Reduced lingual movement/coordination Oral Phase Functional Implications: Right lateral sulci pocketing;Oral residue    Evan [name], Evan [name] 01/25/2012,10:51 AM      Speech Language Pathology Evaluation Patient Details Name: Evan [name] MRN: [id number] DOB: 05-01-1932 Today's Date: 01/25/2012 Time: 4098-1191 SLP Time Calculation (min): 42 min  Problem List:  Patient Active Problem List  Diagnosis  . Diabetes mellitus type II  . CVA (cerebral vascular accident)  . History of renal transplant  . H/O: duodenal ulcer  . H/O atrial fibrillation without current medication  . CA prostate, adenoca   Past Medical History:  Past Medical History  Diagnosis Date  . H/O kidney transplant   . Diabetes mellitus   . Cancer     hx prostate cancer   Past Surgical History:  Past Surgical History  Procedure Date  . Kidney transplant   . Prostate surgery approx. 2003    seed implant   HPI:  78 y/o AA male with hx of HTN, DM on oral hypoglycemic , ESRD s/p renal transplant at [facility] in march 2012, hx of duodenal ulcer, prostate adeno ca, Hx of NSTEMI with afib during HD in feb 2012 with cardiac cath showing non obstructive coronaries and normal EF ( was on amiodarone briefly and discharged on aspirin alone after discussion with renal ) presented to ED with slurry speech and bilateral lower extremity weakness since this morning. Patient was sitting at home when he had this symptoms with slurry speech and word finding difficulty. Also had significant weakness of his bilalteral LE. He has chronic weakness of his RLE. He denies any chest pain, SOB, palpitations, headache, dizziness, nausea, blurry vision, vomiting, abdominal pain, bowel  or urinary symptoms. Denies fever, chills , diaphoresis, recent change in medications.     Assessment / Plan / Recommendation Clinical Impression  Pt presents with a mild to moderate dysarthria  with weak, imprecise articulation at conversation level, especially with multisyllabic words. Pts daughter reports he had a slow rate and decreased volume prior to this event (result of remote right pontine infarct?). Pt with mild left lingual and facial weakness, likely baseline and increased right lingual/facial weakness with new hemorrhagic pontine infarct. Pt would benefit from acute SLP for compensatory strategies for speech and CIR at d/c.     SLP Assessment  Patient needs continued Speech Lanaguage Pathology Services    Follow Up Recommendations  Inpatient Rehab    Frequency and Duration min 1 x/week  2 weeks   Pertinent Vitals/Pain NA   SLP Goals  SLP Goals Potential to Achieve Goals: Good Progress/Goals/Alternative treatment plan discussed with pt/caregiver and they: Agree SLP Goal #1: Pt will converse with moderate verbal/visual cues with loud, overarticulated speech for 5 minutes.   SLP Evaluation Prior Functioning  Cognitive/Linguistic Baseline: Within functional limits Type of Home: House Lives With: Spouse Available Help at Discharge: Family;Available 24 hours/day Vocation: Retired   IT consultant  Overall Cognitive Status: Appears within functional limits for tasks assessed Arousal/Alertness: Awake/alert Orientation Level: Oriented X4    Comprehension  Auditory Comprehension Overall Auditory Comprehension: Appears within functional limits for tasks assessed    Expression Verbal Expression Overall Verbal Expression: Appears within functional limits for tasks assessed   Oral / Motor Oral Motor/Sensory Function Overall Oral Motor/Sensory Function: Impaired (Pt with possible mild left sided weakness at baseline) Labial ROM: Reduced right;Reduced left Labial Symmetry: Abnormal symmetry right Labial Strength: Reduced Labial Sensation: Reduced Lingual ROM: Reduced right;Reduced left Lingual Symmetry: Abnormal symmetry right;Abnormal symmetry left Lingual Strength:  Reduced Lingual Sensation: Reduced Facial ROM: Reduced right Facial Symmetry: Right droop;Right drooping eyelid Facial Strength: Reduced Facial Sensation: Reduced Velum: Within Functional Limits Mandible: Within Functional Limits Motor Speech Overall Motor Speech: Impaired Respiration: Within functional limits Phonation: Normal Resonance: Within functional limits Articulation: Impaired Level of Impairment: Conversation Intelligibility: Intelligibility reduced Word: 75-100% accurate Phrase: 75-100% accurate Sentence: 75-100% accurate Conversation: 75-100% accurate Motor Planning: Witnin functional limits Motor Speech Errors: Aware Interfering Components: Premorbid status Effective Techniques: Over-articulate     Evan [name], Evan [name] 01/25/2012, 10:42 AM

## 2012-01-25 NOTE — Evaluation (Addendum)
Occupational Therapy Evaluation Patient Details Name: Evan [name] MRN: [id number] DOB: 10-03-1931 Today's Date: 01/25/2012 Time: 4098-1191 OT Time Calculation (min): 45 min  OT Assessment / Plan / Recommendation Clinical Impression  Pleasant 78 yr old male admitted to [facility] secondary to new left pontine CVA.  Pt presents with severe balance issues and an increase in dependence with basic selfcare tasks.  Feel he will benefit from acute OT services as well as transition to inpatient rehab for continued OT services.  Feel he can achieve supervision level with inpatient rehab stay and wife is available to provide 24 hour supervision.      OT Assessment  Patient needs continued OT Services       Barriers to Discharge None    Equipment Recommendations  Defer to next venue       Frequency  Min 2X/week    Precautions / Restrictions Precautions Precautions: Fall Precaution Comments: CVA Restrictions Weight Bearing Restrictions: No   Pertinent Vitals/Pain No report of pain    ADL  Eating/Feeding: Simulated;Set up Where Assessed - Eating/Feeding: Chair Grooming: Performed;Moderate assistance Where Assessed - Grooming: Supported standing Upper Body Bathing: Simulated;Supervision/safety Where Assessed - Upper Body Bathing: Unsupported sitting Lower Body Bathing: Moderate assistance Where Assessed - Lower Body Bathing: Supported sit to stand Upper Body Dressing: Simulated;Set up Where Assessed - Upper Body Dressing: Unsupported sitting Lower Body Dressing: Moderate assistance Where Assessed - Lower Body Dressing: Sopported sit to stand Toilet Transfer: Performed;Moderate assistance Toilet Transfer Method: Stand pivot Toilet Transfer Equipment: Comfort height toilet;Grab bars Toileting - Clothing Manipulation and Hygiene: Performed;Moderate assistance Where Assessed - Toileting Clothing Manipulation and Hygiene: Sit to stand from 3-in-1 or toilet Tub/Shower  Transfer Method: Not assessed Equipment Used: Cane Transfers/Ambulation Related to ADLs: Pt with flexed kyphotic posture.  Needed mod facilitation to maintain balance with ambulation to the bathroom using his cane.  Demonstrates frequent LOB forward and to the right in standing.   ADL Comments: Pt moves very slow during attempted selfcare tasks.  He reports that he is usually quicker than this before the CVA.  Decreased coordination and speed also noted in the LUE as well.  Wife is available to provide 24 hour supervision at discharge but cannot provide heavy physical assist.      OT Diagnosis: Generalized weakness;Hemiplegia dominant side  OT Problem List: Decreased strength;Decreased coordination;Impaired balance (sitting and/or standing);Decreased knowledge of use of DME or AE;Impaired UE functional use OT Treatment Interventions: Self-care/ADL training;Therapeutic activities;Therapeutic exercise;Neuromuscular education;DME and/or AE instruction;Manual therapy;Patient/family education;Balance training   OT Goals Acute Rehab OT Goals OT Goal Formulation: With patient/family Time For Goal Achievement: 02/15/12 Potential to Achieve Goals: Good ADL Goals Pt Will Perform Grooming: with min assist;Standing at sink (3 tasks) ADL Goal: Grooming - Progress: Goal set today Pt Will Perform Lower Body Bathing: with min assist;Sit to stand from bed ADL Goal: Lower Body Bathing - Progress: Goal set today Pt Will Perform Lower Body Dressing: with min assist;Sit to stand from chair;Sit to stand from bed ADL Goal: Lower Body Dressing - Progress: Goal set today Pt Will Transfer to Toilet: with DME;3-in-1;with min assist ADL Goal: Toilet Transfer - Progress: Goal set today Pt Will Perform Toileting - Clothing Manipulation: with min assist;Sitting on 3-in-1 or toilet;Standing ADL Goal: Toileting - Clothing Manipulation - Progress: Goal set today Pt Will Perform Toileting - Hygiene: Sit to stand from  3-in-1/toilet;with min assist ADL Goal: Toileting - Hygiene - Progress: Goal set today Miscellaneous OT Goals Miscellaneous OT  Goal #1: Pt will perform RUE FM tasks with supervision following handout to increase dexterity and functional use. OT Goal: Miscellaneous Goal #1 - Progress: Goal set today  Visit Information  Last OT Received On: 01/25/12 Assistance Needed: +2 (safety)    Subjective Data  Subjective: "I've been having balance issues for the past month." Patient Stated Goal: Get back to walking better   Prior Functioning  Home Living Lives With: Spouse Available Help at Discharge: Family;Available 24 hours/day Type of Home: House Home Access: Stairs to enter [employer] of Steps: 3 Entrance Stairs-Rails: Left Home Layout: One level Bathroom Shower/Tub: Health visitor: Handicapped height Bathroom Accessibility: Yes Home Adaptive Equipment: Straight cane;Walker - rolling;Grab bars in shower;Shower chair with back Prior Function Level of Independence: Independent with assistive device(s) Able to Take Stairs?: Yes Driving: No Vocation: Retired Musician: No difficulties Dominant Hand: Right    Cognition  Overall Cognitive Status: Appears within functional limits for tasks assessed/performed Arousal/Alertness: Awake/alert Orientation Level: Appears intact for tasks assessed Behavior During Session: [facility] for tasks performed Cognition - Other Comments: Slow speech, but appears cognitively inctact. Does not appreat to have neglect or visual deficits.     Extremity/Trunk Assessment Right Upper Extremity Assessment RUE ROM/Strength/Tone: Deficits RUE ROM/Strength/Tone Deficits: Shoulder flexion AROM 0-130 degrees.  All other joints AROM WFLs.  Overall strength 3+/5 at the shoulder  4/5 for elbow flexion/extension and grip.  Noted decreased speed with finger to nose testing compared to the left.  Decreased FM cabability and  efficiency when attempting to rmove bottle top.    RUE Sensation: WFL - Light Touch RUE Coordination: Deficits RUE Coordination Deficits: See above for details Left Upper Extremity Assessment LUE ROM/Strength/Tone: Within functional levels LUE Sensation: WFL - Light Touch LUE Coordination: WFL - gross/fine motor Right Lower Extremity Assessment RLE ROM/Strength/Tone: Deficits RLE ROM/Strength/Tone Deficits: Grossly 3+/5 throughout RLE Sensation: WFL - Light Touch RLE Coordination: Deficits RLE Coordination Deficits: Decreaed heel shin slide Left Lower Extremity Assessment LLE ROM/Strength/Tone: WFL for tasks assessed LLE Sensation: WFL - Light Touch LLE Coordination: WFL - gross/fine motor Trunk Assessment Trunk Assessment: Kyphotic   Mobility Bed Mobility Bed Mobility: Supine to Sit Rolling Left: 3: Mod assist;With rail Left Sidelying to Sit: 3: Mod assist;HOB flat;With rails Sitting - Scoot to Edge of Bed: 3: Mod assist Details for Bed Mobility Assistance: Cues for initiation, sequence, technique, and hand placement. Assist for trunk lifting and advancing hips with pad.  Transfers Transfers: Sit to Stand Sit to Stand: 3: Mod assist;From bed;Without upper extremity assist;From toilet Stand to Sit: 3: Mod assist;To toilet Details for Transfer Assistance: Cues for hand placement. Assist needed for anterior translation over BOS and lifting.       Balance Balance Balance Assessed: Yes Static Sitting Balance Static Sitting - Balance Support: Right upper extremity supported Static Sitting - Level of Assistance: 5: Stand by assistance Static Sitting - Comment/# of Minutes: Close SBA due to R trunk lean in sitting x Dynamic Sitting Balance Dynamic Sitting - Balance Support: Right upper extremity supported;Left upper extremity supported Dynamic Sitting - Level of Assistance: 4: Min assist Dynamic Sitting - Balance Activities: Reaching for objects Static Standing Balance Static  Standing - Balance Support: Right upper extremity supported;Left upper extremity supported Static Standing - Level of Assistance: 4: Min assist Static Standing - Comment/# of Minutes: Standing while donning depends Dynamic Standing Balance Dynamic Standing - Balance Support: Left upper extremity supported;Right upper extremity supported;During functional activity Dynamic Standing -  Level of Assistance: 3: Mod assist Dynamic Standing - Balance Activities: Lateral lean/weight shifting;Forward lean/weight shifting;Reaching for objects Dynamic Standing - Comments: Pt with increased LOB to the right and forward in standing during mobility tasks.  End of Session OT - End of Session Equipment Utilized During Treatment: Gait belt Activity Tolerance: Patient tolerated treatment well Patient left: in chair;with call bell/phone within reach;with family/visitor present Nurse Communication: Mobility status   Arneshia Ade OTR/L 01/25/2012, 1:02 PM Pager number [phone]

## 2012-01-25 NOTE — Consult Note (Signed)
Physical Medicine and Rehabilitation Consult Reason for Consult: Stroke Referring Physician: [facility]   HPI: Evan [name] is a 78 y.o. male with history of diabetes mellitus as well as end-stage renal disease with renal transplant [facility]  in March of 2012 admitted 01/24/2012 with slurred speech and bilateral lower extremity weakness x1 day.Patient independent with cane prior to admission. MRI showed nonhemorrhagic acute left paracentral pontine infarct as well as remote small right paracentral pontine infarct. Echocardiogram and carotid Dopplers were pending. Patient did not receive TPA. Renal service followup with baseline creatinine 1.5 and advised to continue usual transplant meds. Neurology consulted presently maintained on Plavix as well as subcutaneous Lovenox. Physical occupational therapy ongoing recommendations for physical medicine rehabilitation consult to consider inpatient rehabilitation services.   Review of Systems  Cardiovascular: Positive for palpitations and leg swelling.  Musculoskeletal: Positive for joint pain.  All other systems reviewed and are negative.   Past Medical History  Diagnosis Date  . H/O kidney transplant   . Diabetes mellitus   . Cancer     hx prostate cancer  . ESRD (end stage renal disease) 2011    hemodialysis started Sept 2011, cadaveric (double) renal  transplant Mar 2012 at [facility]   Past Surgical History  Procedure Date  . Kidney transplant   . Prostate surgery approx. 2003    seed implant   History reviewed. No pertinent family history. Social History:  reports that he quit smoking about 29 years ago. His smoking use included Cigarettes. He quit after 15 years of use. He has never used smokeless tobacco. He reports that he does not drink alcohol or use illicit drugs. Allergies: No Known Allergies Medications Prior to Admission  Medication Sig Dispense Refill  . acetaminophen (TYLENOL) 500 MG tablet Take 1,000 mg by mouth every 6 (six)  hours as needed. For pain      . aspirin EC 81 MG tablet Take 81 mg by mouth daily.      Marland Kitchen diltiazem (TIAZAC) 300 MG 24 hr capsule Take 300 mg by mouth daily.      . furosemide (LASIX) 20 MG tablet Take 10 mg by mouth daily.      Marland Kitchen glipiZIDE (GLUCOTROL) 5 MG tablet Take 5 mg by mouth 2 (two) times daily before a meal.      . hydrALAZINE (APRESOLINE) 25 MG tablet Take 25 mg by mouth 2 (two) times daily.      . megestrol (MEGACE) 400 MG/10ML suspension Take 400 mg by mouth daily as needed.      . mycophenolate (MYFORTIC) 180 MG EC tablet Take 180 mg by mouth 2 (two) times daily.      Marland Kitchen omeprazole (PRILOSEC) 40 MG capsule Take 40 mg by mouth 2 (two) times daily.      . polyethylene glycol (MIRALAX / GLYCOLAX) packet Take 17 g by mouth daily.      Marland Kitchen sulfamethoxazole-trimethoprim (BACTRIM,SEPTRA) 400-80 MG per tablet Take 1 tablet by mouth every Monday, Wednesday, and Friday.      . tacrolimus (PROGRAF) 1 MG capsule Take 5 mg by mouth 2 (two) times daily.        Home: Home Living Lives With: Spouse Available Help at Discharge: Family;Available 24 hours/day Type of Home: House Home Access: Stairs to enter [employer] of Steps: 3 Entrance Stairs-Rails: Left Home Layout: One level Bathroom Shower/Tub: Health visitor: Handicapped height Bathroom Accessibility: Yes Home Adaptive Equipment: Straight cane;Walker - rolling;Grab bars in shower;Shower chair with back  Functional History: Prior Function Able to Take Stairs?: Yes Driving: No Vocation: Retired Functional Status:  Mobility: Bed Mobility Bed Mobility: Supine to Sit Rolling Left: 3: Mod assist;With rail Left Sidelying to Sit: 3: Mod assist;HOB flat;With rails Sitting - Scoot to Edge of Bed: 3: Mod assist Transfers Transfers: Sit to Stand;Stand to Sit Sit to Stand: 3: Mod assist;From bed;Without upper extremity assist;From toilet Stand to Sit: 3: Mod assist;To toilet Ambulation/Gait Ambulation/Gait  Assistance: 3: Mod assist Ambulation Distance (Feet): 30 Feet Assistive device: Rolling walker Ambulation/Gait Assistance Details: Assist for balance and maintaining centered in RW as pt tends to lean R in RW. Cues needed for R foot clearance and positioning in RW. Pt able to adjust with cues, but not maintain. Gait quality declined with fatigue. Decreased R knee ext in swing and stance noted.  Gait Pattern: Step-through pattern;Decreased stride length;Shuffle;Lateral trunk lean to right;Right flexed knee in stance;Trunk flexed General Gait Details: Chair to follow Stairs: No Wheelchair Mobility Wheelchair Mobility: No  ADL: ADL Eating/Feeding: Simulated;Set up Where Assessed - Eating/Feeding: Chair Grooming: Performed;Moderate assistance Where Assessed - Grooming: Supported standing Upper Body Bathing: Simulated;Supervision/safety Where Assessed - Upper Body Bathing: Unsupported sitting Lower Body Bathing: Moderate assistance Where Assessed - Lower Body Bathing: Supported sit to stand Upper Body Dressing: Simulated;Set up Where Assessed - Upper Body Dressing: Unsupported sitting Lower Body Dressing: Moderate assistance Where Assessed - Lower Body Dressing: Sopported sit to stand Toilet Transfer: Performed;Moderate assistance Toilet Transfer Method: Stand pivot Toilet Transfer Equipment: Comfort height toilet;Grab bars Tub/Shower Transfer Method: Not assessed Equipment Used: Cane Transfers/Ambulation Related to ADLs: Pt with flexed kyphotic posture.  Needed mod facilitation to maintain balance with ambulation to the bathroom using his cane.  Demonstrates frequent LOB forward and to the right in standing.   ADL Comments: Pt moves very slow during attempted selfcare tasks.  He reports that he is usually quicker than this before the CVA.  Decreased coordination and speed also noted in the LUE as well.  Wife is available to provide 24 hour supervision at discharge but cannot provide heavy  physical assist.    Cognition: Cognition Overall Cognitive Status: Appears within functional limits for tasks assessed Arousal/Alertness: Awake/alert Orientation Level: Oriented X4 Cognition Overall Cognitive Status: Appears within functional limits for tasks assessed/performed Arousal/Alertness: Awake/alert Orientation Level: Appears intact for tasks assessed Behavior During Session: [facility] for tasks performed Cognition - Other Comments: Slow speech, but appears cognitively inctact. Does not appreat to have neglect or visual deficits.   Blood pressure 138/65, pulse 68, temperature 97.6 F (36.4 C), temperature source Oral, resp. rate 18, height 5' 10.87" (1.8 m), weight 76 kg (167 lb 8.8 oz), SpO2 99.00%. Physical Exam  Constitutional: He appears well-developed.  HENT:  Head: Normocephalic.  Neck: Neck supple. No thyromegaly present.  Cardiovascular: Regular rhythm.   Pulmonary/Chest: Breath sounds normal. He has no wheezes.  Abdominal: Bowel sounds are normal. He exhibits no distension. There is no tenderness.  Musculoskeletal: He exhibits no edema.  Neurological: He is alert.       Dysarthric speech but intelligible. Left facial droop. Names person place and date of birth. Patient follows simple commands  Skin: Skin is warm and dry.  Psychiatric: He has a normal mood and affect.  Motor 5/5 in BUE, 3-/5 in RLE and 4/5 in LLE Sensation intact to lt touch in BUE and BLE Ext -C/C/E Sitting balance reduced Speech dysarthric but not aphasic  Results for orders placed during the hospital encounter of 01/24/12 (from  the past 24 hour(s))  PROTIME-INR     Status: Normal   Collection Time   01/24/12  1:56 PM      Component Value Range   Prothrombin Time 14.1  11.6 - 15.2 seconds   INR 1.07  0.00 - 1.49  APTT     Status: Abnormal   Collection Time   01/24/12  1:56 PM      Component Value Range   aPTT 47 (*) 24 - 37 seconds  CBC     Status: Abnormal   Collection Time   01/24/12  1:56  PM      Component Value Range   WBC 6.7  4.0 - 10.5 K/uL   RBC 4.02 (*) 4.22 - 5.81 MIL/uL   Hemoglobin 11.2 (*) 13.0 - 17.0 g/dL   HCT 16.1 (*) 09.6 - 04.5 %   MCV 81.3  78.0 - 100.0 fL   MCH 27.9  26.0 - 34.0 pg   MCHC 34.3  30.0 - 36.0 g/dL   RDW 40.9  81.1 - 91.4 %   Platelets 187  150 - 400 K/uL  DIFFERENTIAL     Status: Normal   Collection Time   01/24/12  1:56 PM      Component Value Range   Neutrophils Relative 77  43 - 77 %   Neutro Abs 5.2  1.7 - 7.7 K/uL   Lymphocytes Relative 18  12 - 46 %   Lymphs Abs 1.2  0.7 - 4.0 K/uL   Monocytes Relative 4  3 - 12 %   Monocytes Absolute 0.3  0.1 - 1.0 K/uL   Eosinophils Relative 1  0 - 5 %   Eosinophils Absolute 0.1  0.0 - 0.7 K/uL   Basophils Relative 0  0 - 1 %   Basophils Absolute 0.0  0.0 - 0.1 K/uL  COMPREHENSIVE METABOLIC PANEL     Status: Abnormal   Collection Time   01/24/12  1:56 PM      Component Value Range   Sodium 135  135 - 145 mEq/L   Potassium 4.9  3.5 - 5.1 mEq/L   Chloride 104  96 - 112 mEq/L   CO2 20  19 - 32 mEq/L   Glucose, Bld 109 (*) 70 - 99 mg/dL   BUN 32 (*) 6 - 23 mg/dL   Creatinine, Ser 7.82 (*) 0.50 - 1.35 mg/dL   Calcium 9.3  8.4 - 95.6 mg/dL   Total Protein 7.5  6.0 - 8.3 g/dL   Albumin 4.3  3.5 - 5.2 g/dL   AST 18  0 - 37 U/L   ALT 20  0 - 53 U/L   Alkaline Phosphatase 50  39 - 117 U/L   Total Bilirubin 0.3  0.3 - 1.2 mg/dL   GFR calc non Af Amer 40 (*) >90 mL/min   GFR calc Af Amer 46 (*) >90 mL/min  CK TOTAL AND CKMB     Status: Normal   Collection Time   01/24/12  1:56 PM      Component Value Range   Total CK 94  7 - 232 U/L   CK, MB 3.1  0.3 - 4.0 ng/mL   Relative Index RELATIVE INDEX IS INVALID  0.0 - 2.5  TROPONIN I     Status: Normal   Collection Time   01/24/12  1:56 PM      Component Value Range   Troponin I <0.30  <0.30 ng/mL  GLUCOSE, CAPILLARY  Status: Abnormal   Collection Time   01/24/12  2:09 PM      Component Value Range   Glucose-Capillary 116 (*) 70 - 99 mg/dL    Comment 1 Notify RN    POCT I-STAT, CHEM 8     Status: Abnormal   Collection Time   01/24/12  2:23 PM      Component Value Range   Sodium 139  135 - 145 mEq/L   Potassium 4.9  3.5 - 5.1 mEq/L   Chloride 110  96 - 112 mEq/L   BUN 34 (*) 6 - 23 mg/dL   Creatinine, Ser 4.09 (*) 0.50 - 1.35 mg/dL   Glucose, Bld 811 (*) 70 - 99 mg/dL   Calcium, Ion 9.14  7.82 - 1.32 mmol/L   TCO2 18  0 - 100 mmol/L   Hemoglobin 13.3  13.0 - 17.0 g/dL   HCT 95.6  21.3 - 08.6 %  GLUCOSE, CAPILLARY     Status: Abnormal   Collection Time   01/24/12 10:09 PM      Component Value Range   Glucose-Capillary 215 (*) 70 - 99 mg/dL  URINE RAPID DRUG SCREEN (HOSP PERFORMED)     Status: Normal   Collection Time   01/25/12  2:06 AM      Component Value Range   Opiates NONE DETECTED  NONE DETECTED   Cocaine NONE DETECTED  NONE DETECTED   Benzodiazepines NONE DETECTED  NONE DETECTED   Amphetamines NONE DETECTED  NONE DETECTED   Tetrahydrocannabinol NONE DETECTED  NONE DETECTED   Barbiturates NONE DETECTED  NONE DETECTED  URINALYSIS, ROUTINE W REFLEX MICROSCOPIC     Status: Normal   Collection Time   01/25/12  2:06 AM      Component Value Range   Color, Urine YELLOW  YELLOW   APPearance CLEAR  CLEAR   Specific Gravity, Urine 1.011  1.005 - 1.030   pH 6.5  5.0 - 8.0   Glucose, UA NEGATIVE  NEGATIVE mg/dL   Hgb urine dipstick NEGATIVE  NEGATIVE   Bilirubin Urine NEGATIVE  NEGATIVE   Ketones, ur NEGATIVE  NEGATIVE mg/dL   Protein, ur NEGATIVE  NEGATIVE mg/dL   Urobilinogen, UA 0.2  0.0 - 1.0 mg/dL   Nitrite NEGATIVE  NEGATIVE   Leukocytes, UA NEGATIVE  NEGATIVE  HEMOGLOBIN A1C     Status: Abnormal   Collection Time   01/25/12  4:25 AM      Component Value Range   Hemoglobin A1C 5.8 (*) <5.7 %   Mean Plasma Glucose 120 (*) <117 mg/dL  LIPID PANEL     Status: Abnormal   Collection Time   01/25/12  4:25 AM      Component Value Range   Cholesterol 115  0 - 200 mg/dL   Triglycerides 91  <578 mg/dL   HDL 35  (*) >46 mg/dL   Total CHOL/HDL Ratio 3.3     VLDL 18  0 - 40 mg/dL   LDL Cholesterol 62  0 - 99 mg/dL  BASIC METABOLIC PANEL     Status: Abnormal   Collection Time   01/25/12  4:25 AM      Component Value Range   Sodium 137  135 - 145 mEq/L   Potassium 4.8  3.5 - 5.1 mEq/L   Chloride 108  96 - 112 mEq/L   CO2 21  19 - 32 mEq/L   Glucose, Bld 152 (*) 70 - 99 mg/dL   BUN 31 (*) 6 -  23 mg/dL   Creatinine, Ser 1.61 (*) 0.50 - 1.35 mg/dL   Calcium 8.5  8.4 - 09.6 mg/dL   GFR calc non Af Amer 41 (*) >90 mL/min   GFR calc Af Amer 47 (*) >90 mL/min  GLUCOSE, CAPILLARY     Status: Normal   Collection Time   01/25/12  7:48 AM      Component Value Range   Glucose-Capillary 89  70 - 99 mg/dL  GLUCOSE, CAPILLARY     Status: Abnormal   Collection Time   01/25/12 12:17 PM      Component Value Range   Glucose-Capillary 103 (*) 70 - 99 mg/dL   Ct Head Wo Contrast  01/24/2012  *RADIOLOGY REPORT*  Clinical Data: Weakness. Aphasia.  Stroke.  CT HEAD WITHOUT CONTRAST  Technique:  Contiguous axial images were obtained from the base of the skull through the vertex without contrast.  Comparison: None.  Findings: There is a vague low attenuation area adjacent to the posterior limb of the right internal capsule (image 14 series 2). This could represent a small infarct.  There may be some low attenuation of the right thalamus as well.  No territorial infarct is identified.  Posterior fossa structures appear within normal limits.  No mass lesion, midline shift, hydrocephalus or mass effect.  Bilateral lens extractions are present.  The paranasal sinuses demonstrate mild mucosal thickening in the left maxillary sinus.  Calvarium intact.  IMPRESSION: 1.  Vague area of low attenuation along the posterior limb of the right internal capsule which could represent a small infarct. While finding is asymmetric when compared to the contralateral side, confidence level for acute/subacute infarction is low. 2. Critical  Value/emergent results were called by telephone at the time of interpretation on 01/24/2012  at 1428 hours  to  Dr. Alto Denver, who verbally acknowledged these results.  Original Report Authenticated By: Andreas Newport, M.D.   Mr Brain Wo Contrast  01/24/2012  *RADIOLOGY REPORT*  Clinical Data: Weakness.  Diabetic.  History prostate cancer.  End- stage renal disease.  Diabetic.  MRI HEAD WITHOUT CONTRAST  Technique:  Multiplanar, multiecho pulse sequences of the brain and surrounding structures were obtained according to standard protocol without intravenous contrast.  Comparison: 01/24/2012 CT.  Findings: Non hemorrhagic acute left paracentral pontine infarct.  Remote small right paracentral pontine infarct.  Mild small vessel disease type changes.  White matter changes occipital lobes with a symmetric appearance. This probably represents result of small vessel disease.  In this patient with history of end-stage renal disease, mild posterior reversible encephalopathy syndrome changes difficult to completely exclude.  Global atrophy without hydrocephalus.  No intracranial mass lesion detected on this unenhanced exam.  No intracranial hemorrhage.  Cervical spondylotic changes.  Motion limits evaluation.  Altered signal intensity of bone marrow may be related to result of anemia from end-stage renal disease.  Prostate metastatic lesions not entirely excluded.  Cerebellar tonsils minimally low-lying but within range normal limits.  Partial opacification right mastoid air cells without obstructing lesion noted in the region of the posterior-superior nasopharynx.  Minimal to mild ethmoid sinus air cell mucosal thickening.  Major intracranial vascular structures are patent with small vertebral arteries and basilar artery.  Atherosclerotic type changes of the vertebral artery suspected.  IMPRESSION: Non hemorrhagic acute left paracentral pontine infarct.  Remote small right paracentral pontine infarct.  Mild small vessel  disease type changes.  White matter changes occipital lobes with a symmetric appearance. This probably represents result of small vessel disease.  In this patient with history of end-stage renal disease, mild posterior reversible encephalopathy syndrome changes difficult to completely exclude.  Global atrophy without hydrocephalus.  Altered signal intensity of bone marrow may be related to result of anemia from end-stage renal disease.  Prostate metastatic lesions not entirely excluded.  Partial opacification right mastoid air cells.  Minimal to mild ethmoid sinus air cell mucosal thickening.  Major intracranial vascular structures are patent with small vertebral arteries and basilar artery.  Atherosclerotic type changes of the vertebral artery suspected.  Critical Value/emergent results were called by telephone at the time of interpretation on 01/24/2012  at 4:45 p.m.  to  Dr. Alto Denver, who verbally acknowledged these results.  Original Report Authenticated By: Fuller Canada, M.D.    Assessment/Plan: Diagnosis: L paracentral pontine infarct with trunkal ataxia and dysarthria 1. Does the need for close, 24 hr/day medical supervision in concert with the patient's rehab needs make it unreasonable for this patient to be served in a less intensive setting? Yes 2. Co-Morbidities requiring supervision/potential complications: Chronic renal failure, prior R pontine infarct, DM 2, Afib, heart murmur 3. Due to bladder management, bowel management, safety, skin/wound care, disease management, medication administration and patient education, does the patient require 24 hr/day rehab nursing? Yes 4. Does the patient require coordinated care of a physician, rehab nurse, PT (1-2 hrs/day, 5 days/week), OT (1-2 hrs/day, 5 days/week) and SLP (.5-1 hrs/day, 5 days/week) to address physical and functional deficits in the context of the above medical diagnosis(es)? Yes Addressing deficits in the following areas: balance,  endurance, locomotion, strength, transferring, bowel/bladder control, bathing, dressing and feeding 5. Can the patient actively participate in an intensive therapy program of at least 3 hrs of therapy per day at least 5 days per week? Yes 6. The potential for patient to make measurable gains while on inpatient rehab is good 7. Anticipated functional outcomes upon discharge from inpatient rehab are S mobilit with PT, Sup ADL with OT, 100% speech inteligiblity with SLP. 8. Estimated rehab length of stay to reach the above functional goals is: 7-10 Days 9. Does the patient have adequate social supports to accommodate these discharge functional goals? Yes 10. Anticipated D/C setting: Home 11. Anticipated post D/C treatments: HH therapy 12. Overall Rehab/Functional Prognosis: good  RECOMMENDATIONS: This patient's condition is appropriate for continued rehabilitative care in the following setting: CIR Patient has agreed to participate in recommended program. Yes Note that insurance prior authorization may be required for reimbursement for recommended care.  Comment:    01/25/2012

## 2012-01-25 NOTE — Progress Notes (Signed)
Subjective: Patient seen and examined this morning. Feels his speech to be better today. Was able to ambulate with a walker but still feels weak.   Objective:  Vital signs in last 24 hours:  Filed Vitals:   01/24/12 1853 01/24/12 2004 01/25/12 0207 01/25/12 0444  BP:  95/57 137/63 138/65  Pulse:  65 65 68  Temp:  98.1 F (36.7 C) 98.1 F (36.7 C) 97.6 F (36.4 C)  TempSrc:  Oral Oral Oral  Resp:  17 18 18   Height: 5' 10.87" (1.8 m)     Weight:      SpO2:  100% 100% 99%    Intake/Output from previous day:   Intake/Output Summary (Last 24 hours) at 01/25/12 1329 Last data filed at 01/25/12 0700  Gross per 24 hour  Intake 886.67 ml  Output    200 ml  Net 686.67 ml    Physical Exam:   Eyes: PERRL, EOMI, conjunctivae normal, No scleral icterus.  Neck: Supple, Trachea midline normal ROM, No JVD, mass, thyromegaly, or carotid bruit present.  Cardiovascular: RRR, S1 normal, S2 normal, 3/6 systolic murmur, pulses symmetric and intact bilaterally  Pulmonary/Chest: CTAB, no wheezes, rales, or rhonchi  Abdominal: Soft. Non-tender, non-distended, bowel sounds are normal, no masses, organomegaly, or guarding present.  GU: no CVA tenderness Musculoskeletal: No joint deformities, erythema, or stiffness, ROM full and no nontender Ext: no edema and no cyanosis, pulses palpable bilaterally (DP and PT)  Hematology: no cervical, inginal, or axillary adenopathy.  Neurological: A&O x3, slurry speech which has improved from admission, left facial droop improved , Strenght is normal and symmetric bilaterally over upper extremity, power 4/5 in bilateral LE, plantar downgoing bilaterally, cranial nerve II-XII are grossly intact except for left facial droop, no focal motor deficit, sensory intact to light touch bilaterally.  Skin: Warm, dry and intact. No rash, cyanosis, or clubbing.  Psychiatric: Normal mood and affect. speech and behavior is normal. Judgment and thought content normal. Cognition  and memory are normal.    Lab Results:  Basic Metabolic Panel:    Component Value Date/Time   NA 137 01/25/2012 0425   K 4.8 01/25/2012 0425   CL 108 01/25/2012 0425   CO2 21 01/25/2012 0425   BUN 31* 01/25/2012 0425   CREATININE 1.58* 01/25/2012 0425   GLUCOSE 152* 01/25/2012 0425   CALCIUM 8.5 01/25/2012 0425   CALCIUM 9.5 12/25/2011 1005   CBC:    Component Value Date/Time   WBC 6.7 01/24/2012 1356   HGB 13.3 01/24/2012 1423   HCT 39.0 01/24/2012 1423   PLT 187 01/24/2012 1356   MCV 81.3 01/24/2012 1356   NEUTROABS 5.2 01/24/2012 1356   LYMPHSABS 1.2 01/24/2012 1356   MONOABS 0.3 01/24/2012 1356   EOSABS 0.1 01/24/2012 1356   BASOSABS 0.0 01/24/2012 1356    No results found for this or any previous visit (from the past 240 hour(s)).  Studies/Results: Ct Head Wo Contrast  01/24/2012  *RADIOLOGY REPORT*  Clinical Data: Weakness. Aphasia.  Stroke.  CT HEAD WITHOUT CONTRAST  Technique:  Contiguous axial images were obtained from the base of the skull through the vertex without contrast.  Comparison: None.  Findings: There is a vague low attenuation area adjacent to the posterior limb of the right internal capsule (image 14 series 2). This could represent a small infarct.  There may be some low attenuation of the right thalamus as well.  No territorial infarct is identified.  Posterior fossa structures appear within normal limits.  No mass lesion, midline shift, hydrocephalus or mass effect.  Bilateral lens extractions are present.  The paranasal sinuses demonstrate mild mucosal thickening in the left maxillary sinus.  Calvarium intact.  IMPRESSION: 1.  Vague area of low attenuation along the posterior limb of the right internal capsule which could represent a small infarct. While finding is asymmetric when compared to the contralateral side, confidence level for acute/subacute infarction is low. 2. Critical Value/emergent results were called by telephone at the time of interpretation on 01/24/2012   at 1428 hours  to  Dr. Alto Denver, who verbally acknowledged these results.  Original Report Authenticated By: Andreas Newport, M.D.   Mr Brain Wo Contrast  01/24/2012  *RADIOLOGY REPORT*  Clinical Data: Weakness.  Diabetic.  History prostate cancer.  End- stage renal disease.  Diabetic.  MRI HEAD WITHOUT CONTRAST  Technique:  Multiplanar, multiecho pulse sequences of the brain and surrounding structures were obtained according to standard protocol without intravenous contrast.  Comparison: 01/24/2012 CT.  Findings: Non hemorrhagic acute left paracentral pontine infarct.  Remote small right paracentral pontine infarct.  Mild small vessel disease type changes.  White matter changes occipital lobes with a symmetric appearance. This probably represents result of small vessel disease.  In this patient with history of end-stage renal disease, mild posterior reversible encephalopathy syndrome changes difficult to completely exclude.  Global atrophy without hydrocephalus.  No intracranial mass lesion detected on this unenhanced exam.  No intracranial hemorrhage.  Cervical spondylotic changes.  Motion limits evaluation.  Altered signal intensity of bone marrow may be related to result of anemia from end-stage renal disease.  Prostate metastatic lesions not entirely excluded.  Cerebellar tonsils minimally low-lying but within range normal limits.  Partial opacification right mastoid air cells without obstructing lesion noted in the region of the posterior-superior nasopharynx.  Minimal to mild ethmoid sinus air cell mucosal thickening.  Major intracranial vascular structures are patent with small vertebral arteries and basilar artery.  Atherosclerotic type changes of the vertebral artery suspected.  IMPRESSION: Non hemorrhagic acute left paracentral pontine infarct.  Remote small right paracentral pontine infarct.  Mild small vessel disease type changes.  White matter changes occipital lobes with a symmetric appearance. This  probably represents result of small vessel disease.  In this patient with history of end-stage renal disease, mild posterior reversible encephalopathy syndrome changes difficult to completely exclude.  Global atrophy without hydrocephalus.  Altered signal intensity of bone marrow may be related to result of anemia from end-stage renal disease.  Prostate metastatic lesions not entirely excluded.  Partial opacification right mastoid air cells.  Minimal to mild ethmoid sinus air cell mucosal thickening.  Major intracranial vascular structures are patent with small vertebral arteries and basilar artery.  Atherosclerotic type changes of the vertebral artery suspected.  Critical Value/emergent results were called by telephone at the time of interpretation on 01/24/2012  at 4:45 p.m.  to  Dr. Alto Denver, who verbally acknowledged these results.  Original Report Authenticated By: Fuller Canada, M.D.    Medications: Scheduled Meds:   . sodium chloride   Intravenous STAT  . clopidogrel  75 mg Oral Q breakfast  . enoxaparin  40 mg Subcutaneous Q24H  . insulin aspart  0-15 Units Subcutaneous TID WC  . LORazepam  1 mg Intravenous Once  . mycophenolate  180 mg Oral BID  . pantoprazole  40 mg Oral Q1200  . polyethylene glycol  17 g Oral Daily  . sodium chloride  3 mL Intravenous Q12H  . sodium  chloride  3 mL Intravenous Q12H  . sulfamethoxazole-trimethoprim  1 tablet Oral 3 times weekly  . tacrolimus  4 mg Oral Q breakfast  . tacrolimus  5 mg Oral Q supper  . DISCONTD: enoxaparin  40 mg Subcutaneous Q24H  . DISCONTD: megestrol  400 mg Oral Daily  . DISCONTD: tacrolimus  4-5 mg Oral See admin instructions   Continuous Infusions:   . sodium chloride 100 mL/hr at 01/25/12 0721   PRN Meds:.sodium chloride, acetaminophen, sodium chloride  Assessment/Plan: Acute left pontine infarct   residual left facial droop , slurry speech and some lower extremity weakness with gait abnormality on presentation  speech  improving. Stable on  telemetry   EKG shows 1st degree AV block and TWI in v5-v6 (no other EKG in system).  -appreciate neurology consult  -patient on baby aspirin at home, switched to plavix per neurology recommendations  -2D echo, carotid doppler pending -risk factors include hx of smoking, DM and previous hx of Afib for which he is on baby aspirin and was briefly on amiodarone until 1 year back.  -speech and swallow eval  -PT/OT eval recommends CIR consult which has been placed - A1C and lipid panel wnl -allow permissive Hypertension   Diabetes mellitus type II  Hold oral hypoglycemics for now  A1C of 5.8, maintain on SSI   History of renal transplant  Continue immunosuppressants  monitor renal fn  Renal team following  H/O: duodenal ulcer  Continue PPI   H/O atrial fibrillation without current medication  Had hx of NSTEMI in Feb 2012 with a fib while in Dialysis with non obstructed coronaries on cardiac cath by Dr Katrinka Blazing. Was discharged on ASA and palced on amiodarone for some time.  Monitor in tele  follow echo   DVT prophylaxis  sq lovenox       LOS: 1 day   Evan [name] 01/25/2012, 1:29 PM

## 2012-01-25 NOTE — Progress Notes (Signed)
*  PRELIMINARY RESULTS* Vascular Ultrasound Carotid Duplex (Doppler) has been completed.  Preliminary findings: Bilaterally 40-59% ICA stenosis, low to mid end of scale.  Riley Lam, Coda Mathey RDMS 01/25/2012, 9:24 AM

## 2012-01-25 NOTE — Progress Notes (Signed)
*  PRELIMINARY RESULTS* Echocardiogram 2D Echocardiogram has been performed.  Evan [name] 01/25/2012, 11:13 AM

## 2012-01-26 DIAGNOSIS — Z940 Kidney transplant status: Secondary | ICD-10-CM

## 2012-01-26 DIAGNOSIS — E119 Type 2 diabetes mellitus without complications: Secondary | ICD-10-CM

## 2012-01-26 DIAGNOSIS — I10 Essential (primary) hypertension: Secondary | ICD-10-CM

## 2012-01-26 DIAGNOSIS — G459 Transient cerebral ischemic attack, unspecified: Secondary | ICD-10-CM

## 2012-01-26 LAB — GLUCOSE, CAPILLARY
Glucose-Capillary: 103 mg/dL — ABNORMAL HIGH (ref 70–99)
Glucose-Capillary: 87 mg/dL (ref 70–99)
Glucose-Capillary: 166 mg/dL — ABNORMAL HIGH (ref 70–99)

## 2012-01-26 LAB — BASIC METABOLIC PANEL
BUN: 25 mg/dL — ABNORMAL HIGH (ref 6–23)
Creatinine, Ser: 1.35 mg/dL (ref 0.50–1.35)
GFR calc Af Amer: 57 mL/min — ABNORMAL LOW (ref >90)
GFR calc non Af Amer: 49 mL/min — ABNORMAL LOW (ref >90)
Potassium: 4.4 mEq/L (ref 3.5–5.1)

## 2012-01-26 LAB — PHOSPHORUS: Phosphorus: 2.5 mg/dL (ref 2.3–4.6)

## 2012-01-26 LAB — ALBUMIN: Albumin: 3.6 g/dL (ref 3.5–5.2)

## 2012-01-26 LAB — TACROLIMUS LEVEL: Tacrolimus (FK506) - LabCorp: 7 ng/mL

## 2012-01-26 NOTE — Progress Notes (Signed)
Subjective: Speech progressively getting better. Still has weakness. No overnight issues  Objective:  Vital signs in last 24 hours:  Filed Vitals:   01/25/12 0444 01/25/12 1437 01/25/12 2300 01/26/12 0623  BP: 138/65 153/64 157/68 152/67  Pulse: 68 75 69 76  Temp: 97.6 F (36.4 C) 98 F (36.7 C) 98.3 F (36.8 C) 97.7 F (36.5 C)  TempSrc: Oral Oral Oral Oral  Resp: 18 18 15 18   Height:      Weight:      SpO2: 99% 100% 100% 99%    Intake/Output from previous day:   Intake/Output Summary (Last 24 hours) at 01/26/12 1509 Last data filed at 01/26/12 0850  Gross per 24 hour  Intake   1840 ml  Output    400 ml  Net   1440 ml    Physical Exam: Gen: NAD HEENT: no pallor, MMM Cardiovascular: RRR, S1 normal, S2 normal, 3/6 systolic murmur, pulses symmetric and intact bilaterally  Pulmonary/Chest: CTAB, no wheezes, rales, or rhonchi  Abdominal: Soft. Non-tender, non-distended, bowel sounds are normal, no masses, organomegaly, or guarding present.  GU: no CVA tenderness Musculoskeletal: No joint deformities, erythema, or stiffness, ROM full and no nontender Ext: no edema and no cyanosis, pulses palpable bilaterally (DP and PT)  Hematology: no cervical, inginal, or axillary adenopathy.  Neurological: A&O x3, slurry speech which has improved from admission, left facial droop improved , Strenght is normal and symmetric bilaterally over upper extremity, power 4/5 in bilateral LE, plantar downgoing bilaterally, cranial nerve II-XII are grossly intact except for left facial droop, no focal motor deficit, sensory intact to light touch bilaterally.    Lab Results:  Basic Metabolic Panel:    Component Value Date/Time   NA 137 01/26/2012 0610   K 4.4 01/26/2012 0610   CL 110 01/26/2012 0610   CO2 18* 01/26/2012 0610   BUN 25* 01/26/2012 0610   CREATININE 1.35 01/26/2012 0610   GLUCOSE 95 01/26/2012 0610   CALCIUM 8.5 01/26/2012 0610   CALCIUM 9.5 12/25/2011 1005   CBC:    Component  Value Date/Time   WBC 6.7 01/24/2012 1356   HGB 13.3 01/24/2012 1423   HCT 39.0 01/24/2012 1423   PLT 187 01/24/2012 1356   MCV 81.3 01/24/2012 1356   NEUTROABS 5.2 01/24/2012 1356   LYMPHSABS 1.2 01/24/2012 1356   MONOABS 0.3 01/24/2012 1356   EOSABS 0.1 01/24/2012 1356   BASOSABS 0.0 01/24/2012 1356    No results found for this or any previous visit (from the past 240 hour(s)).  Studies/Results: Mr Evan [name] Contrast  01/24/2012  *RADIOLOGY REPORT*  Clinical Data: Weakness.  Diabetic.  History prostate cancer.  End- stage renal disease.  Diabetic.  MRI HEAD WITHOUT CONTRAST  Technique:  Multiplanar, multiecho pulse sequences of the brain and surrounding structures were obtained according to standard protocol without intravenous contrast.  Comparison: 01/24/2012 CT.  Findings: Non hemorrhagic acute left paracentral pontine infarct.  Remote small right paracentral pontine infarct.  Mild small vessel disease type changes.  White matter changes occipital lobes with a symmetric appearance. This probably represents result of small vessel disease.  In this patient with history of end-stage renal disease, mild posterior reversible encephalopathy syndrome changes difficult to completely exclude.  Global atrophy without hydrocephalus.  No intracranial mass lesion detected on this unenhanced exam.  No intracranial hemorrhage.  Cervical spondylotic changes.  Motion limits evaluation.  Altered signal intensity of bone marrow may be related to result of anemia from end-stage renal disease.  Prostate metastatic lesions not entirely excluded.  Cerebellar tonsils minimally low-lying but within range normal limits.  Partial opacification right mastoid air cells without obstructing lesion noted in the region of the posterior-superior nasopharynx.  Minimal to mild ethmoid sinus air cell mucosal thickening.  Major intracranial vascular structures are patent with small vertebral arteries and basilar artery.  Atherosclerotic type  changes of the vertebral artery suspected.  IMPRESSION: Non hemorrhagic acute left paracentral pontine infarct.  Remote small right paracentral pontine infarct.  Mild small vessel disease type changes.  White matter changes occipital lobes with a symmetric appearance. This probably represents result of small vessel disease.  In this patient with history of end-stage renal disease, mild posterior reversible encephalopathy syndrome changes difficult to completely exclude.  Global atrophy without hydrocephalus.  Altered signal intensity of bone marrow may be related to result of anemia from end-stage renal disease.  Prostate metastatic lesions not entirely excluded.  Partial opacification right mastoid air cells.  Minimal to mild ethmoid sinus air cell mucosal thickening.  Major intracranial vascular structures are patent with small vertebral arteries and basilar artery.  Atherosclerotic type changes of the vertebral artery suspected.  Critical Value/emergent results were called by telephone at the time of interpretation on 01/24/2012  at 4:45 p.m.  to  Dr. Alto Denver, who verbally acknowledged these results.  Original Report Authenticated By: Fuller Canada, M.D.    Medications: Scheduled Meds:   . sodium chloride   Intravenous STAT  . clopidogrel  75 mg Oral Q breakfast  . enoxaparin  40 mg Subcutaneous Q24H  . insulin aspart  0-15 Units Subcutaneous TID WC  . mycophenolate  180 mg Oral BID  . pantoprazole  40 mg Oral Q1200  . polyethylene glycol  17 g Oral Daily  . sodium chloride  3 mL Intravenous Q12H  . sodium chloride  3 mL Intravenous Q12H  . sulfamethoxazole-trimethoprim  1 tablet Oral 3 times weekly  . tacrolimus  5 mg Oral BID   Continuous Infusions:   . sodium chloride 100 mL/hr at 01/26/12 1336   PRN Meds:.sodium chloride, acetaminophen, sodium chloride  Assessment/Plan:  Acute left pontine infarct  residual left facial droop , slurry speech and some lower extremity weakness with gait  abnormality on presentation  speech improving.  Stable on telemetry  EKG shows 1st degree AV block and TWI in v5-v6 (no other EKG in system).  -appreciate neurology consult  -patient on baby aspirin at home, switched to plavix per neurology recommendations  -2D echo done , results pending, carotid doppler shows b/l 40-59% stenosis -risk factors include hx of smoking, DM and previous hx of Afib for which he is on baby aspirin and was briefly on amiodarone until 1 year back.  -speech and swallow eval  -PT/OT eval recommends CIR consult . Patient accepted by CIR, insurance auth pending  - A1C and lipid panel wnl  -allow permissive Hypertension   Diabetes mellitus type II  Hold oral hypoglycemics for now  A1C of 5.8, maintain on SSI   History of renal transplant  Continue immunosuppressants  Renal fn improved  appreciate renal recs   H/O: duodenal ulcer  Continue PPI   H/O atrial fibrillation without current medication  Had hx of NSTEMI in Feb 2012 with a fib while in Dialysis with non obstructed coronaries on cardiac cath by Dr Katrinka Blazing. Was discharged on ASA and palced on amiodarone for some time.  Monitor in tele  follow echo results  DVT prophylaxis  sq lovenox  Diet: diabetic  Full code    LOS: 2 days   Evan [name] 01/26/2012, 3:09 PM

## 2012-01-26 NOTE — Progress Notes (Signed)
Subjective: No complaints.  Objective Vital signs in last 24 hours: Filed Vitals:   01/25/12 0444 01/25/12 1437 01/25/12 2300 01/26/12 0623  BP: 138/65 153/64 157/68 152/67  Pulse: 68 75 69 76  Temp: 97.6 F (36.4 C) 98 F (36.7 C) 98.3 F (36.8 C) 97.7 F (36.5 C)  TempSrc: Oral Oral Oral Oral  Resp: 18 18 15 18   Height:      Weight:      SpO2: 99% 100% 100% 99%   Weight change:   Intake/Output Summary (Last 24 hours) at 01/26/12 1250 Last data filed at 01/26/12 0850  Gross per 24 hour  Intake   2880 ml  Output    400 ml  Net   2480 ml   Labs: Basic Metabolic Panel:  Lab 01/26/12 1610 01/25/12 0425 01/24/12 1423 01/24/12 1356 01/22/12 1002  NA 137 137 139 135 139  K 4.4 4.8 4.9 4.9 5.4*  CL 110 108 110 104 107  CO2 18* 21 -- 20 22  GLUCOSE 95 152* 105* 109* 103*  BUN 25* 31* 34* 32* 37*  CREATININE 1.35 1.58* 1.70* 1.60* 1.74*  ALB -- -- -- -- --  CALCIUM 8.5 8.5 -- 9.3 9.4  PHOS 2.5 -- -- -- --   Liver Function Tests:  Lab 01/26/12 0610 01/24/12 1356 01/22/12 1002  AST -- 18 18  ALT -- 20 20  ALKPHOS -- 50 53  BILITOT -- 0.3 0.3  PROT -- 7.5 7.4  ALBUMIN 3.6 4.3 4.1   No results found for this basename: LIPASE:3,AMYLASE:3 in the last 168 hours No results found for this basename: AMMONIA:3 in the last 168 hours CBC:  Lab 01/24/12 1423 01/24/12 1356 01/22/12 1002  WBC -- 6.7 6.4  NEUTROABS -- 5.2 --  HGB 13.3 11.2* 10.7*  HCT 39.0 32.7* 31.7*  MCV -- 81.3 81.5  PLT -- 187 170   PT/INR: @labrcntip (inr:5) Cardiac Enzymes:  Lab 01/24/12 1356  CKTOTAL 94  CKMB 3.1  CKMBINDEX --  TROPONINI <0.30   CBG:  Lab 01/26/12 0755 01/25/12 2108 01/25/12 1715 01/25/12 1217 01/25/12 0748  GLUCAP 87 103* 125* 103* 89    Iron Studies:  Lab 01/22/12 1002  IRON 155*  TIBC 201*  TRANSFERRIN --  FERRITIN 992*    Physical Exam:  Blood pressure 152/67, pulse 76, temperature 97.7 F (36.5 C), temperature source Oral, resp. rate 18, height 5' 10.87" (1.8  m), weight 76 kg (167 lb 8.8 oz), SpO2 99.00%.   Gen: alert, up in chair  Skin: no rash, cyanosis  HEENT: EOMI, sclera anicteric, throat clear  Neck: no JVD, no bruits or LAN  Chest: clear bilat  Heart: regular, difficult to hear due to AVF transmitted bruit from L arm  Abdomen: soft, nontender, transplant scar RLQ, no ascites, no HSM  Ext: no LE edema, AVF LUA with strong bruit  Neuro: alert, Ox3, no focal deficit, bilat LE weakness, slurred speech  Heme/Lymph: no bruising or LAN   Impression/Plan  1. ESRD / Renal transplant - creat 1.3, this is baseline. On appropriate transplant meds (prograf and myfortic).  No other suggestions, will sign off. Please call as needed.  2. Acute stroke, pontine - slurred speech and LE weakness. Per primary 3. DM2 - on oral agents 4. HTN - continue usual BP meds as BP tolerates (hydralazine 25bid, lasix 10/d, tiazac 300/d).  5. Hx pros Ca - treated w brachytherapy 2006   Vinson Moselle  MD [facility] [phone] pgr  [phone] cell 01/26/2012, 12:50 PM

## 2012-01-26 NOTE — Progress Notes (Signed)
Occupational Therapy Treatment Patient Details Name: INIGO LANTIGUA MRN: [id number] DOB: 1931/08/26 Today's Date: 01/26/2012 Time: 1400-1430 OT Time Calculation (min): 30 min  OT Assessment / Plan / Recommendation Comments on Treatment Session Pt with great participation with OT today                             ADL  Eating/Feeding: Performed;Minimal assistance;Other (comment) (using RUE. Encouraged pt to use RUE for all self feeding) ADL Comments: OT session focused on grasp and release RUE, as well as manipulation of items with RUE.      OT Goals Miscellaneous OT Goals OT Goal: Miscellaneous Goal #1 - Progress: Progressing toward goals  Visit Information  Last OT Received On: 01/26/12          Cognition  Overall Cognitive Status: Appears within functional limits for tasks assessed/performed Arousal/Alertness: Awake/alert Orientation Level: Appears intact for tasks assessed Behavior During Session: [facility] for tasks performed       Exercises Shoulder Exercises Shoulder Flexion: AROM;Seated;Both;10 reps (with dowel rod/ 2 sets) Elbow Flexion: AROM;10 reps;Seated Elbow Extension: AROM;10 reps;Seated Wrist Flexion: AROM;10 reps;Seated Wrist Extension: AROM;10 reps;Seated Digit Composite Flexion: AROM;Seated;10 reps     End of Session OT - End of Session Activity Tolerance: Patient tolerated treatment well Patient left: in chair   Vi Biddinger, [place] Kung 01/26/2012, 2:55 PM

## 2012-01-26 NOTE — Progress Notes (Signed)
Physical Therapy Treatment Patient Details Name: Evan [name] MRN: [id number] DOB: April 30, 1932 Today's Date: 01/26/2012 Time: 4098-1191 PT Time Calculation (min): 25 min  PT Assessment / Plan / Recommendation Comments on Treatment Session  Good progress today with increased activity and less assistance required with activities performed.    Follow Up Recommendations  Inpatient Rehab;Supervision/Assistance - 24 hour       Equipment Recommendations  Defer to next venue    Recommendations for Other Services Rehab consult  Frequency Min 5X/week   Plan Discharge plan remains appropriate;Frequency remains appropriate    Precautions / Restrictions Precautions Precautions: Fall       Mobility  Bed Mobility Bed Mobility: Supine to Sit;Sitting - Scoot to Edge of Bed Supine to Sit: 4: Min assist;HOB flat Sitting - Scoot to [place] of Bed: 4: Min assist Details for Bed Mobility Assistance: cues for use of arms to get to edge of bed and minimal assistance needed to elevate trunk into sitting position. cues/minimal assistance to wt shift side-side for scooting hips to edge of bed Transfers Transfers: Sit to Stand;Stand to Sit Sit to Stand: 4: Min assist;With upper extremity assist;From bed;From elevated surface Stand to Sit: 4: Min assist;To chair/3-in-1;To bed;With upper extremity assist;To elevated surface Details for Transfer Assistance: bed slightly elevated to assist with transfers. Repeated transfers x2 for strengthening and instrunction in technique. cues for ant wt shifting to assist with standing. bil knees blocked, however not needed due to no buckling. mod assist brieftly after each stand with  mod verbal/tactle cues for upright posture, hip ext and knee ext for mindline static standing with pt holding onto PTA's forearms for support.                  Ambulation/Gait Ambulation/Gait Assistance: 4: Min assist;3: Mod assist Ambulation Distance (Feet): 35 Feet Assistive device: 2  person hand held assist (bil forearm support) Ambulation/Gait Assistance Details: cues/assist/facilitation for lateral wt shifting, upright posture and to increase bil step length with gait. min assist intiallly, as distance progressed rigth knee buckled x2 requiring mod assist for balance. Gait Pattern: Step-through pattern;Decreased step length - left;Decreased step length - right;Decreased stride length;Trunk flexed;Narrow base of support    Exercises Shoulder Exercises Shoulder Flexion: AROM;Seated;Both;10 reps (with dowel rod/ 2 sets) Elbow Flexion: AROM;10 reps;Seated Elbow Extension: AROM;10 reps;Seated Wrist Flexion: AROM;10 reps;Seated Wrist Extension: AROM;10 reps;Seated Digit Composite Flexion: AROM;Seated;10 reps     PT Goals Acute Rehab PT Goals PT Goal: Supine/Side to Sit - Progress: Progressing toward goal PT Goal: Sit to Stand - Progress: Progressing toward goal PT Goal: Stand to Sit - Progress: Progressing toward goal PT Goal: Ambulate - Progress: Progressing toward goal  Visit Information  Last PT Received On: 01/26/12 Assistance Needed: +1 (+2 helpful for chair follow)    Subjective Data  Subjective: No new compliants, agreeable to therapy. Pt with multiple questions about his recovery and the time frame of his recovery.   Cognition  Overall Cognitive Status: Appears within functional limits for tasks assessed/performed Arousal/Alertness: Awake/alert Orientation Level: Appears intact for tasks assessed Behavior During Session: [facility] for tasks performed    Balance  Balance Balance Assessed: Yes Static Standing Balance Static Standing - Balance Support: Bilateral upper extremity supported Static Standing - Level of Assistance: 4: Min assist Static Standing - Comment/# of Minutes: static standing at bedside before ambulation while lines and chair prepared by nurse tech to follow with gait Dynamic Standing Balance Dynamic Standing - Balance Support: Bilateral  upper extremity  supported Dynamic Standing - Level of Assistance: 4: Min assist Dynamic Standing - Balance Activities: Lateral lean/weight shifting;Forward lean/weight shifting Dynamic Standing - Comments: allternating marches, heel-toe rocking/raises. pt with controled movements today with bil upper ext support on PTA's forearms with min assist for balance.   End of Session PT - End of Session Equipment Utilized During Treatment: Gait belt Activity Tolerance: Patient tolerated treatment well Patient left: in chair;with call bell/phone within reach;with family/visitor present;with chair alarm set Nurse Communication: Mobility status    Sallyanne Kuster 01/26/2012, 3:28 PM  Sallyanne Kuster, PTA Office- [phone]

## 2012-01-26 NOTE — Progress Notes (Signed)
Cm spoke with CIR @ [phone]. Patient not expected for admission this weekend. Although evaluated by CIR MD Kirsteins on 01/25/12, awaiting approval from admissions coordinator. Cm informed Attending of process through [employer].    Evan [name] [phone]

## 2012-01-27 DIAGNOSIS — I10 Essential (primary) hypertension: Secondary | ICD-10-CM

## 2012-01-27 DIAGNOSIS — G459 Transient cerebral ischemic attack, unspecified: Secondary | ICD-10-CM

## 2012-01-27 DIAGNOSIS — E119 Type 2 diabetes mellitus without complications: Secondary | ICD-10-CM

## 2012-01-27 DIAGNOSIS — Z940 Kidney transplant status: Secondary | ICD-10-CM

## 2012-01-27 LAB — BASIC METABOLIC PANEL
BUN: 23 mg/dL (ref 6–23)
CO2: 19 mEq/L (ref 19–32)
Chloride: 111 mEq/L (ref 96–112)
Creatinine, Ser: 1.36 mg/dL — ABNORMAL HIGH (ref 0.50–1.35)
Glucose, Bld: 86 mg/dL (ref 70–99)

## 2012-01-27 LAB — GLUCOSE, CAPILLARY
Glucose-Capillary: 144 mg/dL — ABNORMAL HIGH (ref 70–99)
Glucose-Capillary: 137 mg/dL — ABNORMAL HIGH (ref 70–99)

## 2012-01-27 NOTE — Progress Notes (Signed)
OT Cancellation Note  Treatment cancelled today due to pt just returning to bed and preparing for lunch. Pt anxious regarding decision regarding rehab and when he will transfer to rehab at [facility].  [name] 01/27/2012, 12:17 PM

## 2012-01-27 NOTE — Progress Notes (Signed)
Subjective: Patient feels better today. Speech much improved. still has some LE weakness.  Objective:  Vital signs in last 24 hours:  Filed Vitals:   01/26/12 0623 01/26/12 1540 01/26/12 2222 01/27/12 0612  BP: 152/67 136/75 157/81 159/72  Pulse: 76 66 68 69  Temp: 97.7 F (36.5 C) 97.8 F (36.6 C) 97.8 F (36.6 C) 98.1 F (36.7 C)  TempSrc: Oral Oral Oral Oral  Resp: 18 18 18 18   Height:      Weight:      SpO2: 99% 100% 99% 99%    Intake/Output from previous day:   Intake/Output Summary (Last 24 hours) at 01/27/12 0959 Last data filed at 01/27/12 0900  Gross per 24 hour  Intake   3380 ml  Output   1700 ml  Net   1680 ml    Physical Exam:  General: , in no acute distress. HEENT: no pallor, no icterus, moist oral mucosa, no JVD, no lymphadenopathy Heart: Normal  s1 &s2  Regular rate and rhythm, 3/6 systolic murmur, rubs, gallops. Lungs: Clear to auscultation bilaterally. Abdomen: Soft, nontender, nondistended, positive bowel sounds.texas catheter in place Extremities: No clubbing cyanosis or edema with positive pedal pulses. Neuro: Alert, awake, oriented x3, left facial droop, speech much improved, weakness over b/l LE   Lab Results:  Basic Metabolic Panel:    Component Value Date/Time   NA 137 01/27/2012 0510   K 4.7 01/27/2012 0510   CL 111 01/27/2012 0510   CO2 19 01/27/2012 0510   BUN 23 01/27/2012 0510   CREATININE 1.36* 01/27/2012 0510   GLUCOSE 86 01/27/2012 0510   CALCIUM 8.2* 01/27/2012 0510   CALCIUM 9.5 12/25/2011 1005   CBC:    Component Value Date/Time   WBC 6.7 01/24/2012 1356   HGB 13.3 01/24/2012 1423   HCT 39.0 01/24/2012 1423   PLT 187 01/24/2012 1356   MCV 81.3 01/24/2012 1356   NEUTROABS 5.2 01/24/2012 1356   LYMPHSABS 1.2 01/24/2012 1356   MONOABS 0.3 01/24/2012 1356   EOSABS 0.1 01/24/2012 1356   BASOSABS 0.0 01/24/2012 1356    No results found for this or any previous visit (from the past 240 hour(s)).  Studies/Results: No results  found.  Medications: Scheduled Meds:   . clopidogrel  75 mg Oral Q breakfast  . enoxaparin  40 mg Subcutaneous Q24H  . insulin aspart  0-15 Units Subcutaneous TID WC  . mycophenolate  180 mg Oral BID  . pantoprazole  40 mg Oral Q1200  . polyethylene glycol  17 g Oral Daily  . sulfamethoxazole-trimethoprim  1 tablet Oral 3 times weekly  . tacrolimus  5 mg Oral BID  . DISCONTD: sodium chloride  3 mL Intravenous Q12H  . DISCONTD: sodium chloride  3 mL Intravenous Q12H   Continuous Infusions:   . sodium chloride 100 mL/hr at 01/26/12 1336   PRN Meds:.acetaminophen, DISCONTD: sodium chloride, DISCONTD: sodium chloride  Assessment/Plan:  Acute left pontine infarct  residual left facial droop , slurry speech and some lower extremity weakness with gait abnormality on presentation  Speech markedly  improving.  Stable on telemetry  EKG shows 1st degree AV block and TWI in v5-v6 (no other EKG in system).  -appreciate neurology consult  -patient on baby aspirin at home, switched to plavix per neurology recommendations  -2D echo, carotid doppler shows b/l 40-59% stenosis -risk factors include hx of smoking, DM and previous hx of Afib for which he is on baby aspirin and was briefly on amiodarone until  1 year back.  -PT/OT eval recommends CIR consult , approved by CIR. pending insurance approval - A1C and lipid panel wnl  -allow permissive Hypertension for now  Diabetes mellitus type II  Hold oral hypoglycemics for now  A1C of 5.8, continue SSI   History of renal transplant  Continue immunosuppressants  monitor renal fn  Appreciate renal consult   H/O: duodenal ulcer  Continue PPI   H/O atrial fibrillation without current medication  Had hx of NSTEMI in Feb 2012 with a fib while in Dialysis with non obstructed coronaries on cardiac cath by Dr Katrinka Blazing. Was discharged on ASA and palced on amiodarone for some time.  Monitor in tele  follow echo results  DVT prophylaxis  sq lovenox         LOS: 3 days   Onesty Clair 01/27/2012, 9:59 AM

## 2012-01-28 ENCOUNTER — Inpatient Hospital Stay (HOSPITAL_COMMUNITY)
Admission: RE | Admit: 2012-01-28 | Discharge: 2012-02-14 | DRG: 945 | Disposition: A | Discharge status: Skilled Nursing Facility | Payer: Medicare Other | Source: Ambulatory Visit | Attending: Physical Medicine & Rehabilitation | Admitting: Physical Medicine & Rehabilitation

## 2012-01-28 DIAGNOSIS — Z940 Kidney transplant status: Secondary | ICD-10-CM

## 2012-01-28 DIAGNOSIS — E119 Type 2 diabetes mellitus without complications: Secondary | ICD-10-CM

## 2012-01-28 DIAGNOSIS — Z5189 Encounter for other specified aftercare: Principal | ICD-10-CM

## 2012-01-28 DIAGNOSIS — I6330 Cerebral infarction due to thrombosis of unspecified cerebral artery: Secondary | ICD-10-CM

## 2012-01-28 DIAGNOSIS — Z7902 Long term (current) use of antithrombotics/antiplatelets: Secondary | ICD-10-CM

## 2012-01-28 DIAGNOSIS — R066 Hiccough: Secondary | ICD-10-CM

## 2012-01-28 DIAGNOSIS — M1710 Unilateral primary osteoarthritis, unspecified knee: Secondary | ICD-10-CM

## 2012-01-28 DIAGNOSIS — I350 Nonrheumatic aortic (valve) stenosis: Secondary | ICD-10-CM | POA: Diagnosis present

## 2012-01-28 DIAGNOSIS — N390 Urinary tract infection, site not specified: Secondary | ICD-10-CM

## 2012-01-28 DIAGNOSIS — R29810 Facial weakness: Secondary | ICD-10-CM

## 2012-01-28 DIAGNOSIS — K219 Gastro-esophageal reflux disease without esophagitis: Secondary | ICD-10-CM

## 2012-01-28 DIAGNOSIS — I10 Essential (primary) hypertension: Secondary | ICD-10-CM

## 2012-01-28 DIAGNOSIS — R471 Dysarthria and anarthria: Secondary | ICD-10-CM

## 2012-01-28 DIAGNOSIS — G459 Transient cerebral ischemic attack, unspecified: Secondary | ICD-10-CM

## 2012-01-28 DIAGNOSIS — I639 Cerebral infarction, unspecified: Secondary | ICD-10-CM

## 2012-01-28 DIAGNOSIS — Z79899 Other long term (current) drug therapy: Secondary | ICD-10-CM

## 2012-01-28 DIAGNOSIS — Z8546 Personal history of malignant neoplasm of prostate: Secondary | ICD-10-CM

## 2012-01-28 DIAGNOSIS — I6350 Cerebral infarction due to unspecified occlusion or stenosis of unspecified cerebral artery: Secondary | ICD-10-CM

## 2012-01-28 DIAGNOSIS — Z87891 Personal history of nicotine dependence: Secondary | ICD-10-CM

## 2012-01-28 DIAGNOSIS — B9689 Other specified bacterial agents as the cause of diseases classified elsewhere: Secondary | ICD-10-CM

## 2012-01-28 LAB — RENAL FUNCTION PANEL
Albumin: 3.1 g/dL — ABNORMAL LOW (ref 3.5–5.2)
BUN: 19 mg/dL (ref 6–23)
Chloride: 110 mEq/L (ref 96–112)
Creatinine, Ser: 1.16 mg/dL (ref 0.50–1.35)
GFR calc non Af Amer: 59 mL/min — ABNORMAL LOW (ref >90)
Phosphorus: 3.3 mg/dL (ref 2.3–4.6)
Potassium: 4.4 mEq/L (ref 3.5–5.1)

## 2012-01-28 LAB — BASIC METABOLIC PANEL
Calcium: 8.4 mg/dL (ref 8.4–10.5)
GFR calc Af Amer: 68 mL/min — ABNORMAL LOW (ref >90)
GFR calc non Af Amer: 59 mL/min — ABNORMAL LOW (ref >90)
Glucose, Bld: 81 mg/dL (ref 70–99)
Potassium: 4.4 mEq/L (ref 3.5–5.1)
Sodium: 135 mEq/L (ref 135–145)

## 2012-01-28 LAB — GLUCOSE, CAPILLARY
Glucose-Capillary: 84 mg/dL (ref 70–99)
Glucose-Capillary: 122 mg/dL — ABNORMAL HIGH (ref 70–99)

## 2012-01-28 MED ORDER — POLYETHYLENE GLYCOL 3350 17 G PO PACK
17.0000 g | PACK | Freq: Every day | ORAL | Status: DC | PRN
  Filled 2012-01-28: qty 1

## 2012-01-28 MED ORDER — MYCOPHENOLATE SODIUM 180 MG PO TBEC
180.0000 mg | DELAYED_RELEASE_TABLET | Freq: Two times a day (BID) | ORAL | Status: DC
  Administered 2012-01-28 – 2012-02-14 (×34): 180 mg via ORAL
  Filled 2012-01-28 (×37): qty 1

## 2012-01-28 MED ORDER — ONDANSETRON HCL 4 MG/2ML IJ SOLN: 4.0000 mg | Freq: Four times a day (QID) | INTRAMUSCULAR | Status: DC | PRN

## 2012-01-28 MED ORDER — INSULIN ASPART 100 UNIT/ML ~~LOC~~ SOLN
0.0000 [IU] | Freq: Three times a day (TID) | SUBCUTANEOUS | Status: DC
  Administered 2012-01-28 – 2012-01-29 (×2): 3 [IU] via SUBCUTANEOUS
  Administered 2012-01-30 (×2): 2 [IU] via SUBCUTANEOUS
  Administered 2012-01-31 (×2): 3 [IU] via SUBCUTANEOUS
  Administered 2012-02-01: 2 [IU] via SUBCUTANEOUS
  Administered 2012-02-01: 3 [IU] via SUBCUTANEOUS
  Administered 2012-02-02: 2 [IU] via SUBCUTANEOUS
  Administered 2012-02-02: 3 [IU] via SUBCUTANEOUS
  Administered 2012-02-03: 2 [IU] via SUBCUTANEOUS
  Administered 2012-02-03 – 2012-02-04 (×3): 3 [IU] via SUBCUTANEOUS
  Administered 2012-02-04 – 2012-02-05 (×3): 2 [IU] via SUBCUTANEOUS
  Administered 2012-02-05 – 2012-02-06 (×2): 3 [IU] via SUBCUTANEOUS
  Administered 2012-02-06: 2 [IU] via SUBCUTANEOUS
  Administered 2012-02-06: 3 [IU] via SUBCUTANEOUS
  Administered 2012-02-07: 2 [IU] via SUBCUTANEOUS
  Administered 2012-02-07 – 2012-02-09 (×3): 3 [IU] via SUBCUTANEOUS
  Administered 2012-02-09: 2 [IU] via SUBCUTANEOUS
  Administered 2012-02-10 (×2): 3 [IU] via SUBCUTANEOUS
  Administered 2012-02-11: 5 [IU] via SUBCUTANEOUS
  Administered 2012-02-11: 2 [IU] via SUBCUTANEOUS
  Administered 2012-02-12: 3 [IU] via SUBCUTANEOUS
  Administered 2012-02-12: 5 [IU] via SUBCUTANEOUS
  Administered 2012-02-12 – 2012-02-13 (×2): 3 [IU] via SUBCUTANEOUS
  Administered 2012-02-13: 5 [IU] via SUBCUTANEOUS
  Administered 2012-02-13 – 2012-02-14 (×2): 3 [IU] via SUBCUTANEOUS

## 2012-01-28 MED ORDER — CLOPIDOGREL BISULFATE 75 MG PO TABS
75.0000 mg | ORAL_TABLET | Freq: Every day | ORAL | Status: DC
  Administered 2012-01-29 – 2012-02-14 (×17): 75 mg via ORAL
  Filled 2012-01-28 (×19): qty 1

## 2012-01-28 MED ORDER — PANTOPRAZOLE SODIUM 40 MG PO TBEC
40.0000 mg | DELAYED_RELEASE_TABLET | Freq: Every day | ORAL | Status: DC
  Administered 2012-01-29: 40 mg via ORAL
  Filled 2012-01-28 (×2): qty 1

## 2012-01-28 MED ORDER — TACROLIMUS 1 MG PO CAPS
5.0000 mg | ORAL_CAPSULE | Freq: Two times a day (BID) | ORAL | Status: DC
  Administered 2012-01-28 – 2012-02-13 (×31): 5 mg via ORAL
  Administered 2012-02-13: 4 mg via ORAL
  Administered 2012-02-13: 1 mg via ORAL
  Administered 2012-02-14: 5 mg via ORAL
  Filled 2012-01-28 (×38): qty 5

## 2012-01-28 MED ORDER — ACETAMINOPHEN 325 MG PO TABS
325.0000 mg | ORAL_TABLET | ORAL | Status: DC | PRN
  Administered 2012-01-29 – 2012-02-05 (×3): 650 mg via ORAL
  Filled 2012-01-28 (×3): qty 2

## 2012-01-28 MED ORDER — SORBITOL 70 % SOLN: 30.0000 mL | Freq: Every day | Status: DC | PRN

## 2012-01-28 MED ORDER — ONDANSETRON HCL 4 MG PO TABS
4.0000 mg | ORAL_TABLET | Freq: Four times a day (QID) | ORAL | Status: DC | PRN
  Administered 2012-01-29: 4 mg via ORAL
  Filled 2012-01-28: qty 1

## 2012-01-28 MED ORDER — ENOXAPARIN SODIUM 40 MG/0.4ML ~~LOC~~ SOLN
40.0000 mg | SUBCUTANEOUS | Status: DC
  Administered 2012-01-28 – 2012-02-13 (×17): 40 mg via SUBCUTANEOUS
  Filled 2012-01-28 (×18): qty 0.4

## 2012-01-28 MED ORDER — CLOPIDOGREL BISULFATE 75 MG PO TABS: 75.0000 mg | ORAL_TABLET | Freq: Every day | ORAL | Status: DC

## 2012-01-28 MED ORDER — SULFAMETHOXAZOLE-TRIMETHOPRIM 400-80 MG PO TABS
1.0000 | ORAL_TABLET | ORAL | Status: DC
  Administered 2012-01-30: 1 via ORAL
  Filled 2012-01-28 (×2): qty 1

## 2012-01-28 MED ORDER — POLYETHYLENE GLYCOL 3350 17 G PO PACK
17.0000 g | PACK | Freq: Every day | ORAL | Status: DC
  Administered 2012-01-29 – 2012-02-12 (×12): 17 g via ORAL
  Filled 2012-01-28 (×19): qty 1

## 2012-01-28 NOTE — Progress Notes (Signed)
Pt admitted to room 4142 from [facility] via [facility], pt alert and oriented, with slurred speech at times, family in room with pt, pt and family oriented to room, rehab unit, safety plan and rehab schedule. Pt denies pain, pt resting comfortably in bed with call bell in reach, See [facility] for full assessment

## 2012-01-28 NOTE — Plan of Care (Signed)
Overall Plan of Care [facility]) Patient Details Name: KINCADE GRANBERG MRN: [id number] DOB: 1931/09/04  Diagnosis:    Primary Diagnosis:    CVA (cerebral infarction) Co-morbidities: UTI, HTN, diabetes, ESRD  Functional Problem List  Patient demonstrates impairments in the following areas: Balance, Bladder, Endurance, Motor, Pain, Perception, Safety and Skin Integrity  Basic ADL's: eating, grooming, bathing, dressing and toileting Advanced ADL's: simple meal preparation  Transfers:  bed mobility, bed to chair, toilet, tub/shower, car and furniture Locomotion:  ambulation and stairs  Additional Impairments:  Functional use of upper extremity, Swallowing, Communication  expression, Leisure Awareness and Discharge Disposition  Anticipated Outcomes Item Anticipated Outcome  Eating/Swallowing  Mod I  Basic self-care  supervision  Tolieting  supervision  Bowel/Bladder  Pt is incontinent of urine s/p kidney transplant  Transfers  supervision  Locomotion  supervision  Communication  Mod I  Cognition  At baseline  Pain  Pt will rate pain <3, on scale of 0-10  Safety/Judgment  Pt will call for assist and remain free from falls during admission with min assist  Other  Pt skin will remain intact with min assist   Therapy Plan: PT Frequency: 1-2 X/day, 60-90 minutes OT Frequency: 1-2 X/day, 60-90 minutes SLP Frequency: 1-2 X/day, 30-60 minutes   Team Interventions: Item RN PT OT SLP SW TR Other  Self Care/Advanced ADL Retraining   x      Neuromuscular Re-Education  x x      Therapeutic Activities  x x x     UE/LE Strength Training/ROM  x x      UE/LE Coordination Activities  x x      Visual/Perceptual Remediation/Compensation         DME/Adaptive Equipment Instruction  x x      Therapeutic Exercise  x x      Balance/Vestibular Training  x x      Patient/Family Education x x x x     Cognitive Remediation/Compensation         Functional Mobility Training  x x        Ambulation/Gait Training  x       Museum/gallery curator  x       Wheelchair Propulsion/Positioning  x       Functional Child psychotherapist    x     Speech/Language Facilitation    x     Bladder Management x        Bowel Management x        Disease Management/Prevention         Pain Management x x       Medication Management         Skin Care/Wound Management         Splinting/Orthotics         Discharge Planning  x x x     Psychosocial Support x  x x                        Team Discharge Planning: Destination:  Home Projected Follow-up:  PT, OT and Home Health Projected Equipment Needs:  Bedside Commode, Scientist, physiological Patient/family involved in discharge planning:  Yes  MD ELOS: 3 weeks Medical Rehab Prognosis:  Good Assessment: Pt admitted for CIR therapies. Goals are Mod I to supervision. The team is focusing on neuromuscular reedcucation, safety,  stamina, balance, swallowing, communication, bowel and bladder mgt, fxnl mobility, and adaptive equipment.

## 2012-01-28 NOTE — Discharge Summary (Signed)
Patient ID: Evan [name] MRN: [id number] DOB/AGE: February 05, 1932 78 y.o.  Admit date: 01/24/2012 Discharge date: 01/28/2012  Primary Care Physician:  Laurena Slimmer, MD  Discharge Diagnoses:     Principal Problem:  *acute left pontine stroke  Active Problems:  Diabetes mellitus type II  History of renal transplant  H/O: duodenal ulcer  H/O atrial fibrillation without current medication  CA prostate, adenoca  Aortic stenosis, severe    Medication List  As of 01/28/2012  1:58 PM   STOP taking these medications         aspirin EC 81 MG tablet         TAKE these medications         acetaminophen 500 MG tablet   Commonly known as: TYLENOL   Take 1,000 mg by mouth every 6 (six) hours as needed. For pain      clopidogrel 75 MG tablet   Commonly known as: PLAVIX   Take 1 tablet (75 mg total) by mouth daily with breakfast.      diltiazem 300 MG 24 hr capsule   Commonly known as: TIAZAC   Take 300 mg by mouth daily.      furosemide 20 MG tablet   Commonly known as: LASIX   Take 10 mg by mouth daily.      glipiZIDE 5 MG tablet   Commonly known as: GLUCOTROL   Take 5 mg by mouth 2 (two) times daily before a meal.      hydrALAZINE 25 MG tablet   Commonly known as: APRESOLINE   Take 25 mg by mouth 2 (two) times daily.      megestrol 400 MG/10ML suspension   Commonly known as: MEGACE   Take 400 mg by mouth daily as needed.      mycophenolate 180 MG EC tablet   Commonly known as: MYFORTIC   Take 180 mg by mouth 2 (two) times daily.      omeprazole 40 MG capsule   Commonly known as: PRILOSEC   Take 40 mg by mouth 2 (two) times daily.      polyethylene glycol packet   Commonly known as: MIRALAX / GLYCOLAX   Take 17 g by mouth daily.      sulfamethoxazole-trimethoprim 400-80 MG per tablet   Commonly known as: BACTRIM,SEPTRA   Take 1 tablet by mouth every Monday, Wednesday, and Friday.      tacrolimus 1 MG capsule   Commonly known as: PROGRAF   Take 5 mg by  mouth 2 (two) times daily.            Disposition and Follow-up:  Inpatient rehab [facility] cardiology consulted for severe AS noted on echo  , please follow up with cardiology  Consults:  Thad Ranger ( neurology) Robbie Lis kidney   Significant Diagnostic Studies:  Ct Head Wo Contrast  01/24/2012  *RADIOLOGY REPORT*  Clinical Data: Weakness. Aphasia.  Stroke.  CT HEAD WITHOUT CONTRAST  Technique:  Contiguous axial images were obtained from the base of the skull through the vertex without contrast.  Comparison: None.  Findings: There is a vague low attenuation area adjacent to the posterior limb of the right internal capsule (image 14 series 2). This could represent a small infarct.  There may be some low attenuation of the right thalamus as well.  No territorial infarct is identified.  Posterior fossa structures appear within normal limits.  No mass lesion, midline shift, hydrocephalus or mass effect.  Bilateral lens extractions are present.  The paranasal  sinuses demonstrate mild mucosal thickening in the left maxillary sinus.  Calvarium intact.  IMPRESSION: 1.  Vague area of low attenuation along the posterior limb of the right internal capsule which could represent a small infarct. While finding is asymmetric when compared to the contralateral side, confidence level for acute/subacute infarction is low. 2. Critical Value/emergent results were called by telephone at the time of interpretation on 01/24/2012  at 1428 hours  to  Dr. Alto Denver, who verbally acknowledged these results.  Original Report Authenticated By: Andreas Newport, M.D.   Mr Brain Wo Contrast  01/24/2012  *RADIOLOGY REPORT*  Clinical Data: Weakness.  Diabetic.  History prostate cancer.  End- stage renal disease.  Diabetic.  MRI HEAD WITHOUT CONTRAST  Technique:  Multiplanar, multiecho pulse sequences of the brain and surrounding structures were obtained according to standard protocol without intravenous contrast.  Comparison: 01/24/2012 CT.   Findings: Non hemorrhagic acute left paracentral pontine infarct.  Remote small right paracentral pontine infarct.  Mild small vessel disease type changes.  White matter changes occipital lobes with a symmetric appearance. This probably represents result of small vessel disease.  In this patient with history of end-stage renal disease, mild posterior reversible encephalopathy syndrome changes difficult to completely exclude.  Global atrophy without hydrocephalus.  No intracranial mass lesion detected on this unenhanced exam.  No intracranial hemorrhage.  Cervical spondylotic changes.  Motion limits evaluation.  Altered signal intensity of bone marrow may be related to result of anemia from end-stage renal disease.  Prostate metastatic lesions not entirely excluded.  Cerebellar tonsils minimally low-lying but within range normal limits.  Partial opacification right mastoid air cells without obstructing lesion noted in the region of the posterior-superior nasopharynx.  Minimal to mild ethmoid sinus air cell mucosal thickening.  Major intracranial vascular structures are patent with small vertebral arteries and basilar artery.  Atherosclerotic type changes of the vertebral artery suspected.  IMPRESSION: Non hemorrhagic acute left paracentral pontine infarct.  Remote small right paracentral pontine infarct.  Mild small vessel disease type changes.  White matter changes occipital lobes with a symmetric appearance. This probably represents result of small vessel disease.  In this patient with history of end-stage renal disease, mild posterior reversible encephalopathy syndrome changes difficult to completely exclude.  Global atrophy without hydrocephalus.  Altered signal intensity of bone marrow may be related to result of anemia from end-stage renal disease.  Prostate metastatic lesions not entirely excluded.  Partial opacification right mastoid air cells.  Minimal to mild ethmoid sinus air cell mucosal thickening.   Major intracranial vascular structures are patent with small vertebral arteries and basilar artery.  Atherosclerotic type changes of the vertebral artery suspected.  Critical Value/emergent results were called by telephone at the time of interpretation on 01/24/2012  at 4:45 p.m.  to  Dr. Alto Denver, who verbally acknowledged these results.  Original Report Authenticated By: Fuller Canada, M.D.    Brief H and P: For complete details please refer to admission H and P, but in brief 78 y/o AA male with hx of HTN, DM on oral hypoglycemic , ESRD s/p renal transplant at [facility] in march 2012, hx of duodenal ulcer, prostate adeno ca, Hx of NSTEMI with afib during HD in feb 2012 with cardiac cath showing non obstructive coronaries and normal EF ( was on amiodarone briefly and discharged on aspirin alone after discussion with renal ) presented to ED with slurry speech and bilateral lower extremity weakness since this morning. Patient was sitting at home when  he had this symptoms with slurry speech and word finding difficulty. Also had significant weakness of his bilalteral LE. He has chronic weakness of his RLE. He denies any chest pain, SOB, palpitations, headache, dizziness, nausea, blurry vision, vomiting, abdominal pain, bowel or urinary symptoms. Denies fever, chills , diaphoresis, recent change in medications.  patient had head CT in ED which showed a Vague area of low attenuation along the posterior limb of the right internal capsule which could represent a small infarct. An MRI brain done showed a left pontine infarct. patient was out of window for tPA so did not receive it. [facility] hospitalist called for admission for acute stroke.    Physical Exam on Discharge:  Filed Vitals:   01/27/12 0612 01/27/12 1410 01/27/12 2104 01/28/12 0442  BP: 159/72 153/65 153/66 153/69  Pulse: 69 70 69 67  Temp: 98.1 F (36.7 C) 97.9 F (36.6 C) 98.6 F (37 C) 98.2 F (36.8 C)  TempSrc: Oral Oral Oral Oral  Resp: 18  18 16 18   Height:      Weight:      SpO2: 99% 100% 100% 100%     Intake/Output Summary (Last 24 hours) at 01/28/12 1358 Last data filed at 01/28/12 0900  Gross per 24 hour  Intake   1440 ml  Output   2901 ml  Net  -1461 ml    General: elderly male  in no acute distress.  HEENT: no pallor, no icterus, moist oral mucosa, no JVD, no lymphadenopathy  Heart: Normal s1 &s2 Regular rate and rhythm, 3/6 systolic murmur, rubs, gallops.  Lungs: Clear to auscultation bilaterally.  Abdomen: Soft, nontender, nondistended, positive bowel sounds.texas catheter in place  Extremities: No clubbing cyanosis or edema with positive pedal pulses.  Neuro: Alert, awake, oriented x3, left facial droop, speech much improved, weakness over b/l LE  CBC:    Component Value Date/Time   WBC 6.7 01/24/2012 1356   HGB 13.3 01/24/2012 1423   HCT 39.0 01/24/2012 1423   PLT 187 01/24/2012 1356   MCV 81.3 01/24/2012 1356   NEUTROABS 5.2 01/24/2012 1356   LYMPHSABS 1.2 01/24/2012 1356   MONOABS 0.3 01/24/2012 1356   EOSABS 0.1 01/24/2012 1356   BASOSABS 0.0 01/24/2012 1356    Basic Metabolic Panel:    Component Value Date/Time   NA 135 01/28/2012 0421   NA 136 01/28/2012 0421   K 4.4 01/28/2012 0421   K 4.4 01/28/2012 0421   CL 110 01/28/2012 0421   CL 110 01/28/2012 0421   CO2 18* 01/28/2012 0421   CO2 18* 01/28/2012 0421   BUN 19 01/28/2012 0421   BUN 19 01/28/2012 0421   CREATININE 1.16 01/28/2012 0421   CREATININE 1.16 01/28/2012 0421   GLUCOSE 81 01/28/2012 0421   GLUCOSE 80 01/28/2012 0421   CALCIUM 8.4 01/28/2012 0421   CALCIUM 8.3* 01/28/2012 0421   CALCIUM 9.5 12/25/2011 1005    Hospital Course:  Acute left pontine infarct  residual left facial droop , slurry speech and some lower extremity weakness with gait abnormality on presentation  Speech and facial droop now resolved  Stable on telemetry  EKG shows 1st degree AV block and TWI in v5-v6 (no other EKG in system).  -appreciate neurology consult    -patient on baby aspirin at home, switched to plavix per neurology recommendations  -2D echo shows normal EF with grade 2 diastolic dysfn. Also seen was  severe aortic stenosis with . Valve area: 1.27cm. [facility] cardiology has been consulted  to evaluate today and should be followed up. -carotid doppler shows b/l 40-59% stenosis  -risk factors include hx of smoking, DM and previous hx of Afib for which he is on baby aspirin and was briefly on amiodarone until 1 year back.  - A1C and lipid panel wnl  -patient stable for d/c to inpt rehab  Diabetes mellitus type II   A1C of 5.8, resume home meds  History of renal transplant  Continue immunosuppressants  monitor renal fn  Appreciate renal consult   H/O: duodenal ulcer  Continue PPI   H/O atrial fibrillation without current medication  Had hx of NSTEMI in Feb 2012 with a fib while in Dialysis with non obstructed coronaries on cardiac cath by Dr Katrinka Blazing. Was discharged on ASA and palced on amiodarone for some time.  Stable on tele [facility] cardiology to follow pt  patient stable for discharge to inpt rehab   Follow up with PCP and neurology as outpt  Time spent on Discharge: 45 minutes  Signed: Eddie North 01/28/2012, 1:58 PM

## 2012-01-28 NOTE — PMR Pre-admission (Signed)
PMR Admission Coordinator Pre-Admission Assessment  Patient: Evan [name] is an 78 y.o., male MRN: [id number] DOB: Dec 11, 1931 Height: 5' 10.87" (180 cm) (as reported on 01/08/12) Weight: 76 kg (167 lb 8.8 oz)  Insurance Information HMO:     PPO:      PCP:      IPA:      80/20:      OTHER: x PRIMARY: Medicare      Policy#: [id number]      Subscriber: patient CM Name:       Phone#:      Fax#:  Pre-Cert#:       Employer: retired Benefits:  Phone #:      Name: Armed forces training and education officer. Date: 03/07/99 A and B     Deduct: $1184      Out of Pocket Max:       Life Max: unlim CIR: 100%      SNF: LBD= 02/05/11; 100 days Outpatient: 80%     Co-Pay: 20% Home Health: 100%      Co-Pay: 0 DME: 80%     Co-Pay: 20% Providers: in network SECONDARY: BCBS State [employer] Plan      Policy#: [id number]      Subscriber: patient CM Name:       Phone#:      Fax#:  Pre-Cert#:       Employer:  Benefits:  Phone #:      Name:  Eff. Date:      Deduct:       Out of Pocket Max:       Life Max:  CIR:       SNF:  Outpatient:      Co-Pay:  Home Health:       Co-Pay:  DME:      Co-Pay:   Medicaid Application Date:       Case Manager:  Disability Application Date:       Case Worker:   Emergency Contact Information Contact Information    Name Relation Home Work Mobile   [place] Spouse [phone]     Swann,Phyllis Other [phone]  [phone]   Theotis Burrow [phone]       Current Medical History  Patient Admitting Diagnosis: Left pontine infarct with trunkal ataxia and dysarthria History of Present Illness:Evan [name] is a 78 y.o. male with history of diabetes mellitus as well as end-stage renal disease with renal transplant [facility] in March of 2012 admitted 01/24/2012 with slurred speech and bilateral lower extremity weakness x1 day.Patient independent with cane prior to admission. MRI showed nonhemorrhagic acute left paracentral pontine infarct as well as remote small right paracentral pontine  infarct. Echocardiogram showed ejection fraction 65% with grade 2 diastolic dysfunction. Carotid Dopplers with bilateral 40-59% ICA stenosis . Patient did not receive TPA. Renal service followup with baseline creatinine 1.5 and advised to continue usual transplant meds. Neurology consulted presently maintained on Plavix as well as subcutaneous Lovenox. Physical occupational therapy ongoing recommendations for physical medicine rehabilitation consult to consider inpatient rehabilitation services. Patient was felt to be a good candidate for inpatient rehabilitation services was admitted for comprehensive rehabilitation program  Total: 1     Past Medical History  Past Medical History  Diagnosis Date  . H/O kidney transplant   . Diabetes mellitus   . Cancer     hx prostate cancer  . ESRD (end stage renal disease) 2011    hemodialysis started Sept 2011, cadaveric (double) renal  transplant Mar 2012 at [facility]  Family History  family history is not on file.  Prior Rehab/Hospitalization  Current Medications  Current facility-administered medications:0.9 %  sodium chloride infusion, , Intravenous, Continuous, Nishant Dhungel, MD, Last Rate: 100 mL/hr at 01/26/12 1336;  acetaminophen (TYLENOL) tablet 1,000 mg, 1,000 mg, Oral, Q8H PRN, Nishant Dhungel, MD;  clopidogrel (PLAVIX) tablet 75 mg, 75 mg, Oral, Q breakfast, Nishant Dhungel, MD, 75 mg at 01/28/12 0830 enoxaparin (LOVENOX) injection 40 mg, 40 mg, Subcutaneous, Q24H, Nishant Dhungel, MD, 40 mg at 01/27/12 2215;  insulin aspart (novoLOG) injection 0-15 Units, 0-15 Units, Subcutaneous, TID WC, Nishant Dhungel, MD, 2 Units at 01/27/12 1813;  mycophenolate (MYFORTIC) EC tablet 180 mg, 180 mg, Oral, BID, Nishant Dhungel, MD, 180 mg at 01/28/12 0830;  pantoprazole (PROTONIX) EC tablet 40 mg, 40 mg, Oral, Q1200, Nishant Dhungel, MD, 40 mg at 01/27/12 1244 polyethylene glycol (MIRALAX / GLYCOLAX) packet 17 g, 17 g, Oral, Daily, Nishant Dhungel, MD, 17 g at  01/26/12 0943;  sulfamethoxazole-trimethoprim (BACTRIM,SEPTRA) 400-80 MG per tablet 1 tablet, 1 tablet, Oral, 3 times weekly, Nishant Dhungel, MD, 1 tablet at 01/28/12 0830;  tacrolimus (PROGRAF) capsule 5 mg, 5 mg, Oral, BID, Nishant Dhungel, MD, 5 mg at 01/28/12 0830  Patients Current Diet: Dysphagia  Precautions / Restrictions Precautions Precautions: Fall Precaution Comments: CVA Restrictions Weight Bearing Restrictions: No   Prior Activity Level Limited Community (1-2x/wk): didn't drive PTA; MD appts and limited community Journalist, newspaper / Equipment Home Assistive Devices/Equipment: Cane (specify quad or straight);Eyeglasses;Dentures (specify type) (bottom partial plate) Home Adaptive Equipment: Shower chair with back;Straight cane;Walker - rolling  Prior Functional Level Prior Function Level of Independence: Independent with assistive device(s) Able to Take Stairs?: Yes Driving: No Vocation: Retired  Current Functional Level Cognition  Arousal/Alertness: Awake/alert Overall Cognitive Status: Appears within functional limits for tasks assessed Overall Cognitive Status: Appears within functional limits for tasks assessed/performed Orientation Level: Oriented X4 Cognition - Other Comments: Slow speech, but appears cognitively inctact. Does not appreat to have neglect or visual deficits.     Extremity Assessment (includes Sensation/Coordination)  RUE ROM/Strength/Tone: Deficits RUE ROM/Strength/Tone Deficits: Shoulder flexion AROM 0-130 degrees.  All other joints AROM WFLs.  Overall strength 3+/5 at the shoulder  4/5 for elbow flexion/extension and grip.  Noted decreased speed with finger to nose testing compared to the left.  Decreased FM cabability and efficiency when attempting to rmove bottle top.    RUE Sensation: WFL - Light Touch RUE Coordination: Deficits RUE Coordination Deficits: See above for details  RLE ROM/Strength/Tone: Deficits RLE ROM/Strength/Tone  Deficits: Grossly 3+/5 throughout RLE Sensation: WFL - Light Touch RLE Coordination: Deficits RLE Coordination Deficits: Decreaed heel shin slide    ADLs  Eating/Feeding: Performed;Minimal assistance;Other (comment) (using RUE. Encouraged pt to use RUE for all self feeding) Where Assessed - Eating/Feeding: Chair Grooming: Performed;Set up;Teeth care Where Assessed - Grooming: Supported sitting (fatiqued after adl) Upper Body Bathing: Simulated;Supervision/safety Where Assessed - Upper Body Bathing: Unsupported sitting Lower Body Bathing: Performed;Minimal assistance Where Assessed - Lower Body Bathing: Supported sit to stand Upper Body Dressing: Simulated;Set up Where Assessed - Upper Body Dressing: Unsupported sitting Lower Body Dressing: Performed;Maximal assistance (max for socks due to fatique) Where Assessed - Lower Body Dressing: Sopported sit to stand Toilet Transfer: Performed;Minimal assistance Toilet Transfer Method: Stand pivot Acupuncturist: Bedside commode Toileting - Clothing Manipulation and Hygiene: Performed;Moderate assistance Where Assessed - Toileting Clothing Manipulation and Hygiene: Sit to stand from 3-in-1 or toilet Tub/Shower Transfer Method: Not assessed Equipment Used: Rolling walker  Transfers/Ambulation Related to ADLs: pt transfers with flexed posture; head down.  Cues to keep head up ADL Comments: Pt used RUE throughout adl session.  Able to perform toilet hygiene with min A    Mobility  Bed Mobility: Supine to Sit;Sitting - Scoot to Edge of Bed Rolling Left: 3: Mod assist;With rail Left Sidelying to Sit: 3: Mod assist;HOB flat;With rails Supine to Sit: 4: Min assist;HOB flat Sitting - Scoot to Edge of Bed: 4: Min assist    Transfers  Transfers: Sit to Stand;Stand to Sit Sit to Stand: 4: Min assist;With upper extremity assist;From chair/3-in-1 Stand to Sit: To chair/3-in-1;With upper extremity assist;4: Min assist    Ambulation / Gait  / Stairs / Psychologist, prison and probation services  Ambulation/Gait Ambulation/Gait Assistance: 3: Mod assist Ambulation Distance (Feet): 80 Feet Assistive device: Rolling walker Ambulation/Gait Assistance Details: increased verbal cues for upright extension at hips, trunk, neck, pt with increased knee flexion during swing phase on L LE and decreased DF on R LE Gait Pattern: Step-through pattern;Narrow base of support;Decreased stride length;Trunk flexed General Gait Details: Chair to follow Stairs: No Wheelchair Mobility Wheelchair Mobility: No    Posture / Games developer Sitting - Balance Support: Right upper extremity supported Static Sitting - Level of Assistance: 5: Stand by assistance Static Sitting - Comment/# of Minutes: Close SBA due to R trunk lean in sitting x Dynamic Sitting Balance Dynamic Sitting - Balance Support: Right upper extremity supported;Left upper extremity supported Dynamic Sitting - Level of Assistance: 4: Min assist Dynamic Sitting - Balance Activities: Reaching for objects Static Standing Balance Static Standing - Balance Support: Bilateral upper extremity supported Static Standing - Level of Assistance: 4: Min assist Static Standing - Comment/# of Minutes: static standing at bedside before ambulation while lines and chair prepared by nurse tech to follow with gait Dynamic Standing Balance Dynamic Standing - Balance Support: Bilateral upper extremity supported Dynamic Standing - Level of Assistance: 4: Min assist Dynamic Standing - Balance Activities: Lateral lean/weight shifting;Forward lean/weight shifting Dynamic Standing - Comments: allternating marches, heel-toe rocking/raises. pt with controled movements today with bil upper ext support on PTA's forearms with min assist for balance.     Previous Home Environment Living Arrangements: Spouse/significant other Lives With: Spouse Available Help at Discharge: Family;Available 24 hours/day Type  of Home: House Home Layout: One level Home Access: Stairs to enter Entrance Stairs-Rails: Left Entrance Stairs-Number of Steps: 3 Bathroom Shower/Tub: Health visitor: Handicapped height Bathroom Accessibility: Yes Home Care Services: No  Discharge Living Setting Plans for Discharge Living Setting: Patient's home;House;Lives with (comment) (spouse) Type of Home at Discharge: House Discharge Home Layout: One level Discharge Home Access: Stairs to enter Entrance Stairs-Rails: Left Entrance Stairs-Number of Steps: 3 Discharge Bathroom Shower/Tub: Walk-in shower Discharge Bathroom Toilet: Handicapped height Discharge Bathroom Accessibility: Yes How Accessible: Accessible via walker Do you have any problems obtaining your medications?: No  Social/Family/Support Systems Patient Roles: Spouse;Parent Contact Information: home [phone] Anticipated Caregiver: wife-Jean Anticipated Caregiver's Contact Information: home [phone] Ability/Limitations of Caregiver: can provide supervision Caregiver Availability: 24/7 Discharge Plan Discussed with Primary Caregiver: Yes Is Caregiver In Agreement with Plan?: Yes Does Caregiver/Family have Issues with Lodging/Transportation while Pt is in Rehab?: No  Goals/Additional Needs Patient/Family Goal for Rehab: Supervision PT; Supervision OT; 100% intelligible for SLP Expected length of stay: 7-10 days Cultural Considerations: none Dietary Needs: Carb Modified Equipment Needs: to be determined Additional Information: renal transplant in 2011 Pt/Family Agrees to Admission and willing to participate: Yes  Program Orientation Provided & Reviewed with Pt/Caregiver Including Roles  & Responsibilities: Yes  Patient Condition: Please see physician update to information in consult dated 01/25/12 at 1614.  Preadmission Screen Completed By:  Oletta Darter, 01/28/2012 12:33  PM ______________________________________________________________________   Discussed status with Dr. Wynn Banker on6/24/13 at 1200 and received telephone approval for admission today.  Admission Coordinator:  Oletta Darter, time1237/Date6/24/13

## 2012-01-28 NOTE — Progress Notes (Signed)
Occupational Therapy Treatment Patient Details Name: Evan [name] MRN: [id number] DOB: [birth year] Today's Date: 01/28/2012 Time: 3086-5784 OT Time Calculation (min): 42 min  OT Assessment / Plan / Recommendation Comments on Treatment Session pt very motivated.  Helped with sock and sat for grooming as he was getting fatiqued.      Follow Up Recommendations  Inpatient Rehab    Barriers to Discharge       Equipment Recommendations  Defer to next venue    Recommendations for Other Services    Frequency Min 3X/week   Plan Discharge plan remains appropriate    Precautions / Restrictions Precautions Precautions: Fall Restrictions Weight Bearing Restrictions: No   Pertinent Vitals/Pain No pain    ADL  Grooming: Performed;Set up;Teeth care Where Assessed - Grooming: Supported sitting (fatiqued after adl) Lower Body Bathing: Performed;Minimal assistance Where Assessed - Lower Body Bathing: Supported sit to stand Lower Body Dressing: Performed;Maximal assistance (max for socks due to fatique) Where Assessed - Lower Body Dressing: Sopported sit to stand Toilet Transfer: Performed;Minimal assistance Toilet Transfer Method: Stand pivot Toilet Transfer Equipment: Bedside commode Equipment Used: Rolling walker Transfers/Ambulation Related to ADLs: pt transfers with flexed posture; head down.  Cues to keep head up ADL Comments: Pt used RUE throughout adl session.  Able to perform toilet hygiene with min A    OT Diagnosis:    OT Problem List:   OT Treatment Interventions:     OT Goals Acute Rehab OT Goals Time For Goal Achievement: 02/15/12 Potential to Achieve Goals: Good ADL Goals Pt Will Perform Lower Body Bathing: with min assist;Sit to stand from bed (min guard) ADL Goal: Lower Body Bathing - Progress: Updated due to goal met Pt Will Perform Lower Body Dressing: with min assist;Sit to stand from chair;Sit to stand from bed ADL Goal: Lower Body Dressing - Progress:  Progressing toward goals Pt Will Transfer to Toilet: with DME;3-in-1;with min assist (min guard) ADL Goal: Toilet Transfer - Progress: Updated due to goal met Pt Will Perform Toileting - Hygiene: Sit to stand from 3-in-1/toilet;with min assist ADL Goal: Toileting - Hygiene - Progress: Progressing toward goals Miscellaneous OT Goals Miscellaneous OT Goal #1: Pt will perform RUE FM tasks with supervision following handout to increase dexterity and functional use. OT Goal: Miscellaneous Goal #1 - Progress: Progressing toward goals  Visit Information  Last OT Received On: 01/28/12 Assistance Needed: +1    Subjective Data      Prior Functioning       Cognition  Overall Cognitive Status: Appears within functional limits for tasks assessed/performed Behavior During Session: [facility] for tasks performed    Mobility Bed Mobility Supine to Sit: 4: Min assist;HOB flat Transfers Sit to Stand: 4: Min assist;With upper extremity assist;From bed;From elevated surface   Exercises    Balance    End of Session OT - End of Session Activity Tolerance: Patient limited by fatigue Patient left: in chair;with call bell/phone within reach   [facility] 01/28/2012, 11:07 AM Marica Otter, OTR/L [phone] 01/28/2012

## 2012-01-28 NOTE — H&P (Signed)
Physical Medicine and Rehabilitation Admission H&P    No chief complaint on file. : HPI: Evan [name] is a 78 y.o. male with history of diabetes mellitus as well as end-stage renal disease with renal transplant [facility] in March of 2012 admitted 01/24/2012 with slurred speech and bilateral lower extremity weakness x1 day.Patient independent with cane prior to admission. MRI showed nonhemorrhagic acute left paracentral pontine infarct as well as remote small right paracentral pontine infarct. Echocardiogram showed ejection fraction 65% with grade 2 diastolic dysfunction. Carotid Dopplers with bilateral 40-59% ICA stenosis . Patient did not receive TPA. Renal service followup with baseline creatinine 1.5 and advised to continue usual transplant meds. Neurology consulted presently maintained on Plavix as well as subcutaneous Lovenox. Physical occupational therapy ongoing recommendations for physical medicine rehabilitation consult to consider inpatient rehabilitation services. Patient was felt to be a good candidate for inpatient rehabilitation services was admitted for comprehensive rehabilitation program  Review of Systems  Cardiovascular: Positive for palpitations and leg swelling.  Musculoskeletal: Positive for joint pain.  All other systems reviewed and are negative   Past Medical History  Diagnosis Date  . H/O kidney transplant   . Diabetes mellitus   . Cancer     hx prostate cancer  . ESRD (end stage renal disease) 2011    hemodialysis started Sept 2011, cadaveric (double) renal  transplant Mar 2012 at [facility]   Past Surgical History  Procedure Date  . Kidney transplant   . Prostate surgery approx. 2003    seed implant   No family history on file. Social History:  reports that he quit smoking about 29 years ago. His smoking use included Cigarettes. He quit after 15 years of use. He has never used smokeless tobacco. He reports that he does not drink alcohol or use illicit  drugs. Allergies: No Known Allergies Medications Prior to Admission  Medication Sig Dispense Refill  . acetaminophen (TYLENOL) 500 MG tablet Take 1,000 mg by mouth every 6 (six) hours as needed. For pain      . aspirin 81 MG tablet Take 81 mg by mouth daily.      Marland Kitchen diltiazem (TIAZAC) 300 MG 24 hr capsule Take 300 mg by mouth daily.      . furosemide (LASIX) 20 MG tablet Take 10 mg by mouth daily.      Marland Kitchen glipiZIDE (GLUCOTROL) 5 MG tablet Take by mouth 2 (two) times daily before a meal.       . hydrALAZINE (APRESOLINE) 25 MG tablet Take 25 mg by mouth 2 (two) times daily.      . megestrol (MEGACE) 400 MG/10ML suspension Take 400 mg by mouth daily as needed. For appetite      . mycophenolate (MYFORTIC) 180 MG EC tablet Take 180 mg by mouth 2 (two) times daily.      Marland Kitchen omeprazole (PRILOSEC) 40 MG capsule Take 40 mg by mouth 2 (two) times daily.      . polyethylene glycol (MIRALAX / GLYCOLAX) packet Take 17 g by mouth daily.      Marland Kitchen sulfamethoxazole-trimethoprim (BACTRIM,SEPTRA) 400-80 MG per tablet Take 1 tablet by mouth every Monday, Wednesday, and Friday.      . tacrolimus (PROGRAF) 1 MG capsule Take 5 mg by mouth 2 (two) times daily.        Home:     Functional History:    Functional Status:  Mobility:          ADL:    Cognition: Cognition Orientation Level:  Oriented X4     Blood pressure 178/74, pulse 78, resp. rate 18, SpO2 100.00%. Physical Exam  Constitutional: He appears well-developed.  HENT:  Head: Normocephalic.  Neck: Neck supple. No thyromegaly present.  Cardiovascular: Regular rhythm.  Pulmonary/Chest: Breath sounds normal. He has no wheezes.  Abdominal: Bowel sounds are normal. He exhibits no distension. There is no tenderness.  Musculoskeletal: He exhibits no edema.  Neurological: He is alert.  Dysarthric speech but intelligible. Left facial droop. Names person place and date of birth. Patient follows simple commands  Skin: Skin is warm and dry.   Psychiatric: He has a normal mood and affect.  Motor 5/5 in BUE, 4-/5 in RLE and 4/5 in LLE  Sensation intact to lt touch in BUE and BLE  Ext -C/C/E  Sitting balance reduced  Speech dysarthric but not aphasic   Results for orders placed during the hospital encounter of 01/24/12 (from the past 48 hour(s))  GLUCOSE, CAPILLARY     Status: Abnormal   Collection Time   01/26/12 10:19 PM      Component Value Range Comment   Glucose-Capillary 166 (*) 70 - 99 mg/dL    Comment 1 Notify RN      Comment 2 Documented in Chart     BASIC METABOLIC PANEL     Status: Abnormal   Collection Time   01/27/12  5:10 AM      Component Value Range Comment   Sodium 137  135 - 145 mEq/L    Potassium 4.7  3.5 - 5.1 mEq/L    Chloride 111  96 - 112 mEq/L    CO2 19  19 - 32 mEq/L    Glucose, Bld 86  70 - 99 mg/dL    BUN 23  6 - 23 mg/dL    Creatinine, Ser 1.61 (*) 0.50 - 1.35 mg/dL    Calcium 8.2 (*) 8.4 - 10.5 mg/dL    GFR calc non Af Amer 49 (*) >90 mL/min    GFR calc Af Amer 56 (*) >90 mL/min   ALBUMIN     Status: Abnormal   Collection Time   01/27/12  5:10 AM      Component Value Range Comment   Albumin 3.3 (*) 3.5 - 5.2 g/dL   PHOSPHORUS     Status: Normal   Collection Time   01/27/12  5:10 AM      Component Value Range Comment   Phosphorus 3.1  2.3 - 4.6 mg/dL   GLUCOSE, CAPILLARY     Status: Normal   Collection Time   01/27/12  7:40 AM      Component Value Range Comment   Glucose-Capillary 75  70 - 99 mg/dL    Comment 1 Notify RN      Comment 2 Documented in Chart     GLUCOSE, CAPILLARY     Status: Abnormal   Collection Time   01/27/12 12:08 PM      Component Value Range Comment   Glucose-Capillary 144 (*) 70 - 99 mg/dL    Comment 1 Notify RN      Comment 2 Documented in Chart     GLUCOSE, CAPILLARY     Status: Abnormal   Collection Time   01/27/12  4:36 PM      Component Value Range Comment   Glucose-Capillary 137 (*) 70 - 99 mg/dL    Comment 1 Notify RN      Comment 2 Documented in  Chart     GLUCOSE, CAPILLARY  Status: Abnormal   Collection Time   01/27/12 10:25 PM      Component Value Range Comment   Glucose-Capillary 146 (*) 70 - 99 mg/dL   BASIC METABOLIC PANEL     Status: Abnormal   Collection Time   01/28/12  4:21 AM      Component Value Range Comment   Sodium 135  135 - 145 mEq/L    Potassium 4.4  3.5 - 5.1 mEq/L    Chloride 110  96 - 112 mEq/L    CO2 18 (*) 19 - 32 mEq/L    Glucose, Bld 81  70 - 99 mg/dL    BUN 19  6 - 23 mg/dL    Creatinine, Ser 1.61  0.50 - 1.35 mg/dL    Calcium 8.4  8.4 - 09.6 mg/dL    GFR calc non Af Amer 59 (*) >90 mL/min    GFR calc Af Amer 68 (*) >90 mL/min   RENAL FUNCTION PANEL     Status: Abnormal   Collection Time   01/28/12  4:21 AM      Component Value Range Comment   Sodium 136  135 - 145 mEq/L    Potassium 4.4  3.5 - 5.1 mEq/L    Chloride 110  96 - 112 mEq/L    CO2 18 (*) 19 - 32 mEq/L    Glucose, Bld 80  70 - 99 mg/dL    BUN 19  6 - 23 mg/dL    Creatinine, Ser 0.45  0.50 - 1.35 mg/dL    Calcium 8.3 (*) 8.4 - 10.5 mg/dL    Phosphorus 3.3  2.3 - 4.6 mg/dL    Albumin 3.1 (*) 3.5 - 5.2 g/dL    GFR calc non Af Amer 59 (*) >90 mL/min    GFR calc Af Amer 68 (*) >90 mL/min   GLUCOSE, CAPILLARY     Status: Normal   Collection Time   01/28/12  7:37 AM      Component Value Range Comment   Glucose-Capillary 84  70 - 99 mg/dL    Comment 1 Notify RN      Comment 2 Documented in Chart     GLUCOSE, CAPILLARY     Status: Abnormal   Collection Time   01/28/12 12:20 PM      Component Value Range Comment   Glucose-Capillary 122 (*) 70 - 99 mg/dL    Comment 1 Notify RN      Comment 2 Documented in Chart      No results found.  Post Admission Physician Evaluation: 1. Functional deficits secondary  to right paramedian pontine infarct resulting primarily in truncal. 2. Patient is admitted to receive collaborative, interdisciplinary care between the physiatrist, rehab nursing staff, and therapy team. 3. Patient's level of  medical complexity and substantial therapy needs in context of that medical necessity cannot be provided at a lesser intensity of care such as a SNF. 4. Patient has experienced substantial functional loss from his/her baseline which was documented above under the "Functional History" and "Functional Status" headings.  Judging by the patient's diagnosis, physical exam, and functional history, the patient has potential for functional progress which will result in measurable gains while on inpatient rehab.  These gains will be of substantial and practical use upon discharge  in facilitating mobility and self-care at the household level. 5. Physiatrist will provide 24 hour management of medical needs as well as oversight of the therapy plan/treatment and provide guidance as appropriate regarding the  interaction of the two. 6. 24 hour rehab nursing will assist with bladder management, bowel management, safety, skin/wound care, disease management, medication administration, pain management and patient education  and help integrate therapy concepts, techniques,education, etc. 7. PT will assess and treat for:  Pre-gait training, gait training, safety, endurance, neuromuscular reeducation.  Goals are: supervision to modified independent levels with all mobility. 8. OT will assess and treat for: ADLs, neuromuscular reeducation, equipment, safety, endurance, cognitive perceptual.   Goals are: supervision to modified independent with ADLs. 9. SLP will assess and treat for: cognitive evaluation.  Goals are: direct household management at baseline abilities. 10. Case Management and Social Worker will assess and treat for psychological issues and discharge planning. 11. Team conference will be held weekly to assess progress toward goals and to determine barriers to discharge. 12. Patient will receive at least 3 hours of therapy per day at least 5 days per week. 13. ELOS: 10-12 days      Prognosis:  excellent and  good   Medical Problem List and Plan: 1. Left paracentral pontine infarct 2. DVT Prophylaxis/Anticoagulation: Subcutaneous Lovenox. Monitor platelet counts and any signs of bleeding 3. diabetes mellitus. Hemoglobin A1c 5.8. Presently with sliding scale insulin. Patient on Glucotrol 5 mg twice a day prior to admission. Check blood sugars a.c. and at bedtime 4. Neuropsych: This patient is capable of making decisions on his/her own behalf. 5. History of renal transplant March of 2012. Continue immunosuppressants medicines as per renal services 6. GERD. Protonix 7. History of prostate cancer. Continue Bactrim 3 times weekly for bladder prophylaxis   01/28/2012, 5:36 PM

## 2012-01-28 NOTE — Progress Notes (Signed)
Speech Language Pathology Dysphagia/Speech Treatment Patient Details Name: Evan [name] MRN: [id number] DOB: 06-27-1932 Today's Date: 01/28/2012 Time: 4098-1191 SLP Time Calculation (min): 20 min  Assessment / Plan / Recommendation Clinical Impression  Diet tolerance assessmetn revealed subtle s/s of aspiration during po intake characterized by slight wet vocal quality which cleared immediately with spontaneous throat clearing. This same type of throat clearing also noted at baseline; ? build up of saliva with possible penetration/aspiration however patient appears to be indepedently compensating at this time. Education complete with patient, wife, and daughter regarding aspiration precautions, compensatory strategies, and location of stroke increasing risk of swallowing difficulty. Although patient appears to be tolerating diet at this time, subtle difficulty and location of stroke warrant further f/u. SLP will continue f/u on the acute level. Recommend CIR f/u after d/c (possible today?).     Diet Recommendation  Continue with Current Diet: Dysphagia 3 (mechanical soft);Thin liquid    SLP Plan Continue with current plan of care   Pertinent Vitals/Pain n/a   Swallowing Goals  SLP Swallowing Goals Patient will consume recommended diet without observed clinical signs of aspiration with: Independent assistance Swallow Study Goal #1 - Progress: Progressing toward goal Patient will utilize recommended strategies during swallow to increase swallowing safety with: Independent assistance Swallow Study Goal #2 - Progress: Progressing toward goal  General Temperature Spikes Noted: No Respiratory Status: Room air Behavior/Cognition: Alert;Cooperative;Pleasant mood Oral Cavity - Dentition: Adequate natural dentition Patient Positioning: Upright in chair   Dysphagia Treatment Treatment focused on: Skilled observation of diet tolerance;Facilitation of oral preparatory phase;Facilitation of oral  phase;Utilization of compensatory strategies;Patient/family/caregiver education Family/Caregiver Educated: wife and daughter Treatment Methods/Modalities: Skilled observation;Differential diagnosis Patient observed directly with PO's: Yes Type of PO's observed: Dysphagia 3 (soft);Thin liquids Feeding: Able to feed self Liquids provided via: Straw Pharyngeal Phase Signs & Symptoms: Wet vocal quality;Immediate throat clear Type of cueing: Verbal Amount of cueing: Minimal   Speech  Treatment Patient Details Time: 1135-1145 SLP Time Calculation (min): 10 min  Assessment / Plan / Recommendation    SLP Plan  Continue with current plan of care       SLP Goals  SLP Goals SLP Goal #1: Pt will converse with moderate verbal/visual cues with loud, overarticulated speech for 5 minutes.  SLP Goal #1 - Progress: Progressing toward goal  Treatment Treatment focused on: Dysarthria Skilled Treatment: Treatment focused on speech intelligibility. Education complete with patient, wife, and daughter regarding speech intelligibility strategies. Patient then able to utilize strategies with supervision to increase intelligibility to 90% at the conversation level during high level functional task (ordering lunch via phone). Patient making good progress with goals. Will benefit from continued f/u acutely as well as on CIR (possible d/c today?).    Ferdinand Lango MA, CCC-SLP [phone]   [name] [name] 01/28/2012, 12:12 PM     Ferdinand Lango [name] 01/28/2012, 12:09 PM

## 2012-01-28 NOTE — Progress Notes (Signed)
Physical Therapy Treatment Patient Details Name: Evan [name] MRN: [id number] DOB: April 26, 1932 Today's Date: 01/28/2012 Time: 4098-1191 PT Time Calculation (min): 18 min  PT Assessment / Plan / Recommendation Comments on Treatment Session  Pt making good progress with ambulation and performed LE exercises.  Pt hopeful to d/c to inpatient rehab soon.    Follow Up Recommendations  Inpatient Rehab;Supervision/Assistance - 24 hour    Barriers to Discharge        Equipment Recommendations  Defer to next venue    Recommendations for Other Services Rehab consult  Frequency     Plan Discharge plan remains appropriate;Frequency remains appropriate    Precautions / Restrictions Precautions Precautions: Fall Restrictions Weight Bearing Restrictions: No   Pertinent Vitals/Pain No pain    Mobility  Bed Mobility Supine to Sit: 4: Min assist;HOB flat Transfers Transfers: Sit to Stand;Stand to Sit Sit to Stand: 4: Min assist;With upper extremity assist;From chair/3-in-1 Stand to Sit: To chair/3-in-1;With upper extremity assist;4: Min assist Details for Transfer Assistance: pt required increased time and verbal cues to scoot to edge of chair (has trouble weight shifting), assist for weakness, verbal cues for safe technique (including backing up to chair and not sitting while turning) Ambulation/Gait Ambulation/Gait Assistance: 3: Mod assist Ambulation Distance (Feet): 80 Feet Assistive device: Rolling walker Ambulation/Gait Assistance Details: increased verbal cues for upright extension at hips, trunk, neck, pt with increased knee flexion during swing phase on L LE and decreased DF on R LE Gait Pattern: Step-through pattern;Narrow base of support;Decreased stride length;Trunk flexed Modified Rankin (Stroke Patients Only) Modified Rankin: Moderately severe disability    Exercises General Exercises - Lower Extremity Ankle Circles/Pumps: AROM;Strengthening;Both;20 reps;Seated Long Arc  Quad: AROM;Strengthening;Both;20 reps;Seated Hip ABduction/ADduction: AROM;Strengthening;Both;20 reps;Supine Hip Flexion/Marching: AROM;Strengthening;Both;15 reps;Seated Other Exercises Other Exercises: hip adduction with pillow x20 seated Other Exercises: Pt demonstrates slower movement and decreased AROM with R LE when compared to L LE during exercises.   PT Diagnosis:    PT Problem List:   PT Treatment Interventions:     PT Goals Acute Rehab PT Goals PT Goal: Sit to Stand - Progress: Progressing toward goal PT Goal: Stand to Sit - Progress: Progressing toward goal PT Goal: Ambulate - Progress: Progressing toward goal  Visit Information  Last PT Received On: 01/28/12 Assistance Needed: +1    Subjective Data  Subjective: "This is my daughter.  She's trying to be the parent."   Cognition  Overall Cognitive Status: Appears within functional limits for tasks assessed/performed Behavior During Session: [facility] for tasks performed    Balance     End of Session PT - End of Session Equipment Utilized During Treatment: Gait belt Activity Tolerance: Patient tolerated treatment well Patient left: in chair;with call bell/phone within reach;with family/visitor present    Depaul Arizpe,KATHrine E 01/28/2012, 11:39 AM Pager: [phone]

## 2012-01-28 NOTE — Discharge Instructions (Signed)
STROKE/TIA DISCHARGE INSTRUCTIONS SMOKING Cigarette smoking nearly doubles your risk of having a stroke & is the single most alterable risk factor  If you smoke or have smoked in the last 12 months, you are advised to quit smoking for your health.  Most of the excess cardiovascular risk related to smoking disappears within a year of stopping.  Ask you doctor about anti-smoking medications  [place] Quit Line: 1-800-QUIT NOW  Free Smoking Cessation Classes ([phone]  CHOLESTEROL Know your levels; limit fat & cholesterol in your diet  Lipid Panel  No results found for this basename: chol, trig, hdl, cholhdl, vldl, ldlcalc      Many patients benefit from treatment even if their cholesterol is at goal.  Goal: Total Cholesterol (CHOL) less than 160  Goal:  Triglycerides (TRIG) less than 150  Goal:  HDL greater than 40  Goal:  LDL (LDLCALC) less than 100   BLOOD PRESSURE American Stroke Association blood pressure target is less that 120/80 mm/Hg  Your discharge blood pressure is:  BP: 138/65 mmHg  Monitor your blood pressure  Limit your salt and alcohol intake  Many individuals will require more than one medication for high blood pressure  DIABETES (A1c is a blood sugar average for last 3 months) Goal HGBA1c is under 7% (HBGA1c is blood sugar average for last 3 months)  Diabetes: {STROKE DC DIABETES:22357}    No results found for this basename: HGBA1C     Your HGBA1c can be lowered with medications, healthy diet, and exercise.  Check your blood sugar as directed by your physician  Call your physician if you experience unexplained or low blood sugars.  PHYSICAL ACTIVITY/REHABILITATION Goal is 30 minutes at least 4 days per week    {STROKE DC ACTIVITY/REHAB:22359}  Activity decreases your risk of heart attack and stroke and makes your heart stronger.  It helps control your weight and blood pressure; helps you relax and can improve your mood.  Participate in a regular exercise  program.  Talk with your doctor about the best form of exercise for you (dancing, walking, swimming, cycling).  DIET/WEIGHT Goal is to maintain a healthy weight  Your discharge diet is: Cardiac *** liquids Your height is:  Height: 5' 10.87" (180 cm) (as reported on 01/08/12) Your current weight is: Weight: 76 kg (167 lb 8.8 oz) Your Body Mass Index (BMI) is:     Following the type of diet specifically designed for you will help prevent another stroke.  Your goal weight range is:  ***  Your goal Body Mass Index (BMI) is 19-24.  Healthy food habits can help reduce 3 risk factors for stroke:  High cholesterol, hypertension, and excess weight.  RESOURCES Stroke/Support Group:  Call [phone]  they meet the 3rd Sunday of the month on the Rehab Unit at [facility], [place] ( no meetings June, July & Aug).  STROKE EDUCATION PROVIDED/REVIEWED AND GIVEN TO PATIENT Stroke warning signs and symptoms How to activate emergency medical system (call 911). Medications prescribed at discharge. Need for follow-up after discharge. Personal risk factors for stroke. Pneumonia vaccine given:   {STROKE DC YES/NO/DATE:22363} Flu vaccine given:   {STROKE DC YES/NO/DATE:22363} My questions have been answered, the writing is legible, and I understand these instructions.  I will adhere to these goals & educational materials that have bStroke (Cerebrovascular Accident) A stroke (cerebrovascular accident, CVA) means you have a brain injury from blocked circulation or bleeding in the brain. Blocked circulation usually comes from a clot. RISK FACTORS  High blood pressure (hypertension).   High cholesterol.   Diabetes.   Heart disease.   The buildup of fatty deposits in the blood vessels (peripheral artery disease or atherosclerosis).   An abnormal heart rhythm (atrial fibrillation).   Obesity.   Smoking.   Taking oral contraceptives (especially in combination with smoking).   Physical inactivity.   A diet  high in fats, salt (sodium), and calories.   Alcohol use.   Use of illegal drugs (especially cocaine and methamphetamine).   Being a male.   Being an Tree surgeon.   Age over 42.   Family history of stroke.   Previous history of blood clots, a "warning stroke" (transient ischemic attack, TIA), or heart attack.   Sickle cell disease.  SYMPTOMS  The symptoms of a stroke depend on the part of the brain that is affected. It is important to seek treatment within 4 hours of the start of symptoms because you may receive a "clot dissolving" medication that cannot be given after that time. Even if you don't know when your symptoms began, get treatment as soon as possible. Symptoms of a stroke may progress or change over the first several days. Symptoms may include:  Sudden weakness or numbness of the face, arm, or leg, especially on one side of the body.   Sudden confusion.   Trouble speaking (aphasia) or understanding.   Sudden trouble seeing in one or both eyes.   Sudden trouble walking.   Dizziness.   Loss of balance or coordination.   Sudden severe headache with no known cause.  HOME CARE INSTRUCTIONS   Medicines: Aspirin and blood thinners may be used to prevent another stroke. Blood thinners need to be used exactly as instructed. Medicines may also be used to control risk factors for a stroke. Be sure you understand all your medicine instructions.   Diet: Certain diets may be prescribed to address high blood pressure, high cholesterol, diabetes, or obesity. A diet that includes 5 or more servings of fruits and vegetables a day may reduce the risk of stroke. Foods may need to be a special consistency (soft or pureed), or small bites may need to be taken in order to avoid aspirating or choking.   Maintain a healthy weight.   Stay physically active. It is recommended that you get at least 30 minutes of activity on most or all days.   Do not smoke.   Limit alcohol use.     Stop drug abuse.   Home safety: A safe home environment is important to reduce the risk of falls. Your caregiver may arrange for specialists to evaluate your home. Having grab bars in the bedroom and bathroom is often important. Your caregiver may arrange for special equipment to be used at home, such as raised toilets and a seat for the shower.   Physical, occupational, and speech therapy: Ongoing therapy may be needed to maximize your recovery after a stroke. If you have been advised to use a walker or a cane, use it at all times. Be sure to keep your therapy appointments.   Follow all instructions for follow-up with your caregiver. This is VERY important. This includes any referrals, physical therapy, rehabilitation, and laboratory tests. Proper treatment also prevents another stroke from occurring.  SEEK IMMEDIATE MEDICAL CARE IF:   You have sudden weakness or numbness of the face, arm, or leg, especially on one side of the body.   You have sudden confusion.   You have trouble speaking (aphasia)  or understanding.   You have sudden trouble seeing in one or both eyes.   You have sudden trouble walking.   You have dizziness.   You have loss of balance or coordination.   You have a sudden, severe headache with no known cause.   You have a fever.   You are coughing or have difficulty breathing.   You have new chest pain, angina, or an irregular heartbeat.  Any of these symptoms may represent a serious problem that is an emergency. Do not wait to see if the symptoms will go away. Get medical help at once. Call your local emergency services (911 in [place] Do not drive yourself to the hospital. Document Released: 07/23/2005 Document Revised: 02/05/2011 Document Reviewed: 12/21/2009 Exit[employer] Patient Information 2012 [employer].een provided to me after my discharge from the hospital.

## 2012-01-29 DIAGNOSIS — N390 Urinary tract infection, site not specified: Secondary | ICD-10-CM

## 2012-01-29 DIAGNOSIS — Z5189 Encounter for other specified aftercare: Secondary | ICD-10-CM

## 2012-01-29 DIAGNOSIS — I6330 Cerebral infarction due to thrombosis of unspecified cerebral artery: Secondary | ICD-10-CM

## 2012-01-29 DIAGNOSIS — N186 End stage renal disease: Secondary | ICD-10-CM

## 2012-01-29 LAB — URINALYSIS, ROUTINE W REFLEX MICROSCOPIC
Bilirubin Urine: NEGATIVE
Ketones, ur: NEGATIVE mg/dL
Protein, ur: 30 mg/dL — AB
Urobilinogen, UA: 0.2 mg/dL (ref 0.0–1.0)

## 2012-01-29 LAB — COMPREHENSIVE METABOLIC PANEL
ALT: 38 U/L (ref 0–53)
AST: 29 U/L (ref 0–37)
CO2: 19 mEq/L (ref 19–32)
Calcium: 8.8 mg/dL (ref 8.4–10.5)
GFR calc non Af Amer: 58 mL/min — ABNORMAL LOW (ref >90)
Potassium: 4.3 mEq/L (ref 3.5–5.1)
Sodium: 134 mEq/L — ABNORMAL LOW (ref 135–145)

## 2012-01-29 LAB — DIFFERENTIAL
Basophils Absolute: 0 10*3/uL (ref 0.0–0.1)
Eosinophils Relative: 2 % (ref 0–5)
Lymphocytes Relative: 8 % — ABNORMAL LOW (ref 12–46)
Lymphs Abs: 0.7 10*3/uL (ref 0.7–4.0)
Monocytes Absolute: 0.5 10*3/uL (ref 0.1–1.0)

## 2012-01-29 LAB — CBC
MCH: 27.1 pg (ref 26.0–34.0)
Platelets: 215 10*3/uL (ref 150–400)
RBC: 3.73 MIL/uL — ABNORMAL LOW (ref 4.22–5.81)
WBC: 8.2 10*3/uL (ref 4.0–10.5)

## 2012-01-29 LAB — URINE MICROSCOPIC-ADD ON

## 2012-01-29 MED ORDER — NON FORMULARY: 40.0000 mg | Freq: Two times a day (BID) | Status: DC

## 2012-01-29 MED ORDER — OMEPRAZOLE 20 MG PO CPDR
40.0000 mg | DELAYED_RELEASE_CAPSULE | Freq: Two times a day (BID) | ORAL | Status: DC
  Administered 2012-01-29 – 2012-02-14 (×32): 40 mg via ORAL
  Filled 2012-01-29 (×35): qty 2

## 2012-01-29 NOTE — Care Management (Signed)
Inpatient Rehabilitation Center Individual Statement of Services  Patient Name:  Evan [name]  Date:  01/29/2012  Welcome to the Inpatient Rehabilitation Center.  Our goal is to provide you with an individualized program based on your diagnosis and situation, designed to meet your specific needs.  With this comprehensive rehabilitation program, you will be expected to participate in at least 3 hours of rehabilitation therapies Monday-Friday, with modified therapy programming on the weekends.  Your rehabilitation program will include the following services:  Physical Therapy (PT), Occupational Therapy (OT), Speech Therapy (ST), 24 hour per day rehabilitation nursing, Therapeutic Recreaction (TR), Case Management (RN and Child psychotherapist), Rehabilitation Medicine, Nutrition Services and Pharmacy Services  Weekly team conferences will be held on  Tuesday to discuss your progress.  Your RN Case Designer, television/film set will talk with you frequently to get your input and to update you on team discussions.  Team conferences with you and your family in attendance may also be held.  Expected length of stay: @3  weeks    Overall anticipated outcome: Supervision-Min Assist  Depending on your progress and recovery, your program may change.  Your RN Case Estate agent will coordinate services and will keep you informed of any changes.  Your RN Sports coach and SW names and contact numbers are listed  below.  The following services may also be recommended but are not provided by the Inpatient Rehabilitation Center:   Driving Evaluations  Home Health Rehabiltiation Services  Outpatient Rehabilitatation [facility]  Vocational Rehabilitation   Arrangements will be made to provide these services after discharge if needed.  Arrangements include referral to agencies that provide these services.  Your insurance has been verified to be:  Medicare + [employer] Your primary doctor is:  Dr. Margaretmary Bayley  Pertinent information will be shared with your doctor and your insurance company.  Case Manager: Melanee Spry, [facility] [phone]  Social Worker:  [place], [place] [phone]  Information discussed with and copy given to patient by: Brock Ra, 01/29/2012, 1:12 PM

## 2012-01-29 NOTE — Progress Notes (Signed)
Patient ID: Evan [name], male   DOB: February 12, 1932, 78 y.o.   MRN: [id number] Subjective/Complaints: Occasional coughing when eating and drinking, drooling, slept well.  Other ros items negative  Objective: Vital Signs: Blood pressure 175/82, pulse 74, temperature 99.6 F (37.6 C), temperature source Oral, resp. rate 19, SpO2 97.00%. No results found.  Basename 01/29/12 0630  WBC 8.2  HGB 10.1*  HCT 29.8*  PLT 215    Basename 01/29/12 0630 01/28/12 0421  NA 134* 135136  K 4.3 4.44.4  CL 106 110110  CO2 19 18*18*  GLUCOSE 93 8180  BUN 17 1919  CREATININE 1.18 1.161.16  CALCIUM 8.8 8.48.3*   CBG (last 3)   Basename 01/28/12 1220 01/28/12 0737 01/27/12 2225  GLUCAP 122* 84 146*    Wt Readings from Last 3 Encounters:  01/24/12 76 kg (167 lb 8.8 oz)  01/08/12 78.926 kg (174 lb)  01/01/12 78.926 kg (174 lb)    Physical Exam:  General appearance: alert, cooperative and no distress Head: Normocephalic, without obvious abnormality, atraumatic Eyes: conjunctivae/corneas clear. PERRL, EOM's intact. Fundi benign. Ears: normal TM's and external ear canals both ears Nose: Nares normal. Septum midline. Mucosa normal. No drainage or sinus tenderness. Throat: lips, mucosa, and tongue normal; teeth and gums normal Neck: no adenopathy, no carotid bruit, no JVD, supple, symmetrical, trachea midline and thyroid not enlarged, symmetric, no tenderness/mass/nodules Back: symmetric, no curvature. ROM normal. No CVA tenderness. Resp: clear to auscultation bilaterally Cardio: regular rate and rhythm, S1, S2 normal, no murmur, click, rub or gallop GI: soft, non-tender; bowel sounds normal; no masses,  no organomegaly Extremities: extremities normal, atraumatic, no cyanosis or edema Pulses: 2+ and symmetric Skin: Skin color, texture, turgor normal. No rashes or lesions Neurologic: right side 3+ to 4 upper and 2-3 lower. Left lean. Right central 7 and tonuge deviation, speech slightly  dysarthric. Incision/Wound:  none   Assessment/Plan: 1. Functional deficits secondary to left pontine infarct which require 3+ hours per day of interdisciplinary therapy in a comprehensive inpatient rehab setting. Physiatrist is providing close team supervision and 24 hour management of active medical problems listed below. Physiatrist and rehab team continue to assess barriers to discharge/monitor patient progress toward functional and medical goals. FIM:                   Comprehension Comprehension Mode: Auditory Comprehension: 5-Understands complex 90% of the time/Cues < 10% of the time  Expression Expression Mode: Verbal Expression: 5-Expresses complex 90% of the time/cues < 10% of the time  Social Interaction Social Interaction: 7-Interacts appropriately with others - No medications needed.  Problem Solving Problem Solving: 5-Solves complex 90% of the time/cues < 10% of the time  Memory Memory: 5-Recognizes or recalls 90% of the time/requires cueing < 10% of the time  1. Left paracentral pontine infarct  2. DVT Prophylaxis/Anticoagulation: Subcutaneous Lovenox. Monitor platelet counts and any signs of bleeding  3. diabetes mellitus. Hemoglobin A1c 5.8. Presently with sliding scale insulin. Patient on Glucotrol 5 mg twice a day prior to admission. Check blood sugars a.c. and at bedtime  4. Neuropsych: This patient is capable of making decisions on his/her own behalf.  5. History of renal transplant March of 2012. Continue immunosuppressants medicines as per renal services  6. GERD. Protonix  7. History of prostate cancer. Continue Bactrim 3 times weekly for bladder prophylaxis      LOS (Days) 1 A FACE TO FACE EVALUATION WAS PERFORMED  Jerzie Bieri T 01/29/2012, 8:45 AM

## 2012-01-29 NOTE — Progress Notes (Signed)
Physical Therapy Note  Patient Details  Name: Evan [name] MRN: [id number] Date of Birth: April 10, 1932 Today's Date: 01/29/2012  Time: 1300-1330 30 minutes  No c/o pain.  Standing balance with reaching/bending tasks with mod A for balance with 1 UE support, max tactile cues for full upright posture.  Gait training 2 x 35' with min A for forward walking, max A for turns with assist for wt shifts, RW control, cues for sequencing.  Pt with 1 episode of R knee buckling when fatigued with gait requiring max A to correct.  Individual therapy   Kenedee Molesky 01/29/2012, 3:45 PM

## 2012-01-29 NOTE — Evaluation (Signed)
Speech Language Pathology Assessment and Plan  Patient Details  Name: Evan [name] MRN: [id number] Date of Birth: 04/24/1932  SLP Diagnosis: Dysphagia;Dysarthria  Rehab Potential: Excellent ELOS: 3 weeks   Today's Date: 01/29/2012 Time: 4098-1191 Time Calculation (min): 55 min  Skilled Therapeutic Intervention: Administered BSE, please see below for details.   Problem List:  Patient Active Problem List  Diagnosis  . Diabetes mellitus type II  . CVA (cerebral vascular accident)  . History of renal transplant  . H/O: duodenal ulcer  . H/O atrial fibrillation without current medication  . CA prostate, adenoca  . Aortic stenosis  . CVA (cerebral infarction)   Past Medical History:  Past Medical History  Diagnosis Date  . H/O kidney transplant   . Diabetes mellitus   . Cancer     hx prostate cancer  . ESRD (end stage renal disease) 2011    hemodialysis started Sept 2011, cadaveric (double) renal  transplant Mar 2012 at [name]   Past Surgical History:  Past Surgical History  Procedure Date  . Kidney transplant   . Prostate surgery approx. 2003    seed implant    Assessment / Plan / Recommendation Clinical Impression  Patient is a 78 y.o. year old male with history of diabetes mellitus as well as end-stage renal disease with renal transplant Evan [name] in March of 2012 admitted 01/24/2012 with slurred speech and bilateral lower extremity weakness x1 day. Patient independent with cane prior to admission. MRI showed nonhemorrhagic acute left paracentral pontine infarct as well as remote small right paracentral pontine infarct. Patient transferred to CIR on 01/28/2012. Pt presents with mild dysarthria characterized by decreased intelligibility and decreased vocal intensity. Pt also demonstrates decreased management of secretions with intermittent s/s of aspiration throughout the day. BSE administered and pt did not demonstrate any overt s/s of aspiration, however, pt demonstrating  constant hiccups. Due to decrease management of secretions, intermittent overt s/s of aspiration and location of CVA, recommend pt have MBSS to assess for possible aspiration. Recommend pt continue with current diet of Dys. 3 textures and thin liquids with full supervision. Pt would benefit from skilled SLP intervention to maximize overall speech intelligibility and swallowing safety with least restrictive diet.     SLP Assessment  Patient will need skilled Speech Lanaguage Pathology Services during CIR admission    Recommendations  Follow up Recommendations:  (TBD) Equipment Recommended: None recommended by SLP    SLP Frequency 1-2 X/day, 30-60 minutes   SLP Treatment/Interventions Dysphagia/aspiration precaution training;Patient/family education;Therapeutic Activities;Speech/Language facilitation;Environmental controls;Functional tasks;Cueing hierarchy;Internal/external aids    Pain No/Denies Pain   See FIM for current functional status Refer to Care Plan for Long Term Goals  Recommendations for other services: None  Discharge Criteria: Patient will be discharged from SLP if patient refuses treatment 3 consecutive times without medical reason, if treatment goals not met, if there is a change in medical status, if patient makes no progress towards goals or if patient is discharged from [name].  The above assessment, treatment plan, treatment alternatives and goals were discussed and mutually agreed upon: by patient  Evan [name] 01/29/2012, 4:23 PM

## 2012-01-29 NOTE — Evaluation (Signed)
Physical Therapy Assessment and Plan  Patient Details  Name: Evan [name] MRN: [id number] Date of Birth: [birth year]  PT Diagnosis: Abnormal posture, Abnormality of gait, Difficulty walking, Hemiplegia dominant and Muscle weakness Rehab Potential: Good ELOS: 3 weeks   Today's Date: 01/29/2012 Time: 1005-1105 Time Calculation (min): 60 min  Problem List:  Patient Active Problem List  Diagnosis  . Diabetes mellitus type II  . CVA (cerebral vascular accident)  . History of renal transplant  . H/O: duodenal ulcer  . H/O atrial fibrillation without current medication  . CA prostate, adenoca  . Aortic stenosis  . CVA (cerebral infarction)    Past Medical History:  Past Medical History  Diagnosis Date  . H/O kidney transplant   . Diabetes mellitus   . Cancer     hx prostate cancer  . ESRD (end stage renal disease) 2011    hemodialysis started Sept 2011, cadaveric (double) renal  transplant Mar 2012 at [facility]   Past Surgical History:  Past Surgical History  Procedure Date  . Kidney transplant   . Prostate surgery approx. 2003    seed implant    Assessment & Plan Clinical Impression: Patient is a 78 y.o. year old male with recent admission to the hospital on 01/24/2012 with slurred speech and bilateral lower extremity weakness x1 day.Patient independent with cane prior to admission. MRI showed nonhemorrhagic acute left paracentral pontine infarct as well as remote small right paracentral pontine infarct. Echocardiogram showed ejection fraction 65% with grade 2 diastolic dysfunction. Carotid Dopplers with bilateral 40-59% ICA stenosis .Patient transferred to CIR on 01/28/2012 .   Patient currently requires max with mobility secondary to muscle weakness and impaired timing and sequencing, abnormal tone, unbalanced muscle activation, decreased coordination and decreased motor planning.  Prior to hospitalization, patient was mod I with mobility and lived with Spouse in a House home.   Home access is 4Stairs to enter.  Patient will benefit from skilled PT intervention to maximize safe functional mobility, minimize fall risk and decrease caregiver burden for planned discharge home with 24 hour supervision.  Anticipate patient will benefit from follow up HH at discharge.  PT - End of Session Activity Tolerance: Tolerates 30+ min activity with multiple rests Endurance Deficit: Yes Endurance Deficit Description: requires frequent rests PT Assessment Rehab Potential: Good PT Plan PT Frequency: 1-2 X/day, 60-90 minutes Estimated Length of Stay: 3 weeks PT Treatment/Interventions: Ambulation/gait training;Balance/vestibular training;Discharge planning;DME/adaptive equipment instruction;Functional mobility training;Therapeutic Activities;UE/LE Coordination activities;Wheelchair propulsion/positioning;UE/LE Strength taining/ROM;Stair training;Patient/family education;Pain management;Neuromuscular re-education;Therapeutic Exercise PT Recommendation Follow Up Recommendations: Home health PT  PT Evaluation Precautions/Restrictions Precautions Precautions: Fall Restrictions Weight Bearing Restrictions: No Pain Pain Assessment Pain Assessment: No/denies pain Home Living/Prior Functioning Home Living Lives With: Spouse Available Help at Discharge: Family Type of Home: House Home Access: Stairs to enter Secretary/administrator of Steps: 4 Entrance Stairs-Rails: Left Home Layout: One level Bathroom Shower/Tub: Health visitor: Standard Home Adaptive Equipment: Environmental consultant - rolling;Straight cane Prior Function Level of Independence: Requires assistive device for independence;Independent with transfers;Independent with basic ADLs (used SPC) Able to Take Stairs?: Yes Driving: No  Cognition Overall Cognitive Status: Appears within functional limits for tasks assessed Arousal/Alertness: Awake/alert Orientation Level: Oriented X4 Comments: slow  processing Sensation Sensation Light Touch: Appears Intact Stereognosis: Appears Intact Hot/Cold: Appears Intact Proprioception: Impaired by gross assessment Additional Comments: impaired R UE Coordination Gross Motor Movements are Fluid and Coordinated: No Fine Motor Movements are Fluid and Coordinated: No Coordination and Movement Description: slow movements on R UE,  decreased/delayed motor planning and processing Finger Nose Finger Test: slower on right Motor  Motor Motor: Hemiplegia;Abnormal postural alignment and control Motor - Skilled Clinical Observations: leans L in sitting, R sided weakness  Mobility Bed Mobility Rolling Left: 3: Mod assist Rolling Left Details (indicate cue type and reason): cues for sequencing, wt shifting, motor planning Left Sidelying to Sit: 3: Mod assist Left Sidelying to Sit Details (indicate cue type and reason): cues for wt shift and technique Sitting - Scoot to Edge of Bed: 3: Mod assist Sitting - Scoot to Edge of Bed Details (indicate cue type and reason): cues for technqiue, manual facilitation at hips for wt shift Transfers Sit to Stand: 2: Max assist Sit to Stand Details (indicate cue type and reason): cues for hand placement, manual assistance for anterior translation Stand to Sit: 3: Mod assist Stand Pivot Transfers: 2: Max assist Stand Pivot Transfer Details (indicate cue type and reason): cues for sequencing, technique, manual facilitation for wt shifting, safety with turning Locomotion  Ambulation Ambulation/Gait Assistance: 1: +2 Total assist Ambulation Distance (Feet): 5 Feet Assistive device: 2 person hand held assist Ambulation/Gait Assistance Details: unable to achieve full trunk extension, narrow BOS, decreased step/stride length, decreased cadence Stairs / Additional Locomotion Stairs: Yes Stairs Assistance: 3: Mod assist Stairs Assistance Details (indicate cue type and reason): cues for safety and sequencing, assist to  advance R LE forward to put fully down step when descending Stair Management Technique: Two rails Number of Stairs: 5  Wheelchair Mobility Wheelchair Mobility: Yes Wheelchair Assistance: 2: Max Chiropodist Details:  (needs cues for steering, limited by R UE weakness) Wheelchair Propulsion: Both upper extremities Wheelchair Parts Management: Needs assistance Distance: 15  Trunk/Postural Assessment  Cervical Assessment Cervical Assessment:  (forward head, decreased AROM Simultaneous filing. User may not have seen previous data.) Thoracic Assessment Thoracic Assessment:  (kyphosis, decreased AROM Simultaneous filing. User may not have seen previous data.) Lumbar Assessment Lumbar Assessment:  (posterior tilt Simultaneous filing. User may not have seen previous data.) Postural Control Postural Control:  (leans L in sitting, decreased strength Simultaneous filing. User may not have seen previous data.)  Balance Static Sitting Balance Static Sitting - Level of Assistance: 4: Min assist Dynamic Sitting Balance Dynamic Sitting - Balance Support: During functional activity Dynamic Sitting - Level of Assistance: 4: Min assist Static Standing Balance Static Standing - Balance Support: During functional activity Static Standing - Level of Assistance: 3: Mod assist Dynamic Standing Balance Dynamic Standing - Balance Support: During functional activity;No upper extremity supported Dynamic Standing - Level of Assistance: 2: Max assist Dynamic Standing - Comments: forward lean, max tactile and verbal cues to maintain upright posture while performing functional task Extremity Assessment  RLE Assessment RLE Assessment:  (grossly 3-/5, ankle DF limited to -5 degrees from neutral) LLE Assessment LLE Assessment: Within Functional Limits (ankle DF limited to -5 degrees from neutral)  See FIM for current functional status Refer to Care Plan for Long Term Goals  Recommendations for  other services: None  Discharge Criteria: Patient will be discharged from PT if patient refuses treatment 3 consecutive times without medical reason, if treatment goals not met, if there is a change in medical status, if patient makes no progress towards goals or if patient is discharged from hospital.  The above assessment, treatment plan, treatment alternatives and goals were discussed and mutually agreed upon: by patient  Treatment initiated during session: Gait with RW 2 x 35' with min A, pt able to achieve more  upright posture with RW.  Unable to fully extend knees and trunk in standing.  Sit to stand multiple attempts with mod-max A cues for anterior translation and UE placement.  Standing dynamic balance for functional activities with decreased LE and trunk strength noted, pt unable to fully stand without UE support.  Pt with decreased activity tolerance, requiring frequent rests.  Adeja Sarratt 01/29/2012, 12:01 PM

## 2012-01-29 NOTE — Evaluation (Signed)
Occupational Therapy Assessment and Plan  Patient Details  Name: Evan [name] MRN: [id number] Date of Birth: 1932-02-18  OT Diagnosis: abnormal posture, hemiplegia affecting dominant side and muscle weakness (generalized) Rehab Potential: Rehab Potential: Good ELOS: 3 weeks   Today's Date: 01/29/2012 Time: 1005-1105 Time Calculation (min): 60 min  Problem List:  Patient Active Problem List  Diagnosis  . Diabetes mellitus type II  . CVA (cerebral vascular accident)  . History of renal transplant  . H/O: duodenal ulcer  . H/O atrial fibrillation without current medication  . CA prostate, adenoca  . Aortic stenosis  . CVA (cerebral infarction)    Past Medical History:  Past Medical History  Diagnosis Date  . H/O kidney transplant   . Diabetes mellitus   . Cancer     hx prostate cancer  . ESRD (end stage renal disease) 2011    hemodialysis started Sept 2011, cadaveric (double) renal  transplant Mar 2012 at [facility]   Past Surgical History:  Past Surgical History  Procedure Date  . Kidney transplant   . Prostate surgery approx. 2003    seed implant    Assessment & Plan Clinical Impression: Patient is a 78 y.o. year old male with history of diabetes mellitus as well as end-stage renal disease with renal transplant [facility] in March of 2012 admitted 01/24/2012 with slurred speech and bilateral lower extremity weakness x1 day.Patient independent with cane prior to admission. MRI showed nonhemorrhagic acute left paracentral pontine infarct as well as remote small right paracentral pontine infarct. Echocardiogram showed ejection fraction 65% with grade 2 diastolic dysfunction. Carotid Dopplers with bilateral 40-59% ICA stenosis . Patient did not receive TPA. Renal service followup with baseline creatinine 1.5 and advised to continue usual transplant meds. Neurology consulted presently maintained on Plavix as well as subcutaneous Lovenox.    Patient transferred to CIR on 01/28/2012  .    Patient currently requires max to total A with basic self-care skills and basic mobility (transfers, sit to stand, standing balance) secondary to muscle weakness and muscle joint tightness, decreased cardiorespiratoy endurance, impaired timing and sequencing, abnormal tone, decreased coordination and decreased motor planning, decreased midline orientation and decreased sitting balance, decreased standing balance, decreased postural control, hemiplegia and decreased balance strategies.  Prior to hospitalization, patient could complete ADLs with indepedence.  Patient will benefit from skilled intervention to decrease level of assist with basic self-care skills and increase independence with basic self-care skills prior to discharge home with care partner.  Anticipate patient will require intermittent supervision and follow up home health.  OT - End of Session Activity Tolerance: Tolerates 30+ min activity with multiple rests Endurance Deficit: Yes Endurance Deficit Description: requires frequent rests OT Assessment Rehab Potential: Good OT Plan OT Frequency: 1-2 X/day, 60-90 minutes Estimated Length of Stay: 3 weeks OT Treatment/Interventions: Balance/vestibular training;Community reintegration;Discharge planning;Cognitive remediation/compensation;DME/adaptive equipment instruction;Neuromuscular re-education;Pain management;Patient/family education;Functional mobility training;Self Care/advanced ADL retraining;Therapeutic Activities;Therapeutic Exercise;UE/LE Strength taining/ROM;UE/LE Coordination activities OT Recommendation Follow Up Recommendations: Home health OT  OT Evaluation Precautions/Restrictions  Precautions Precautions: Fall Restrictions Weight Bearing Restrictions: No General Chart Reviewed: Yes Family/Caregiver Present: No Pain Pain Assessment Pain Assessment: No/denies pain Home Living/Prior Functioning Home Living Lives With: Spouse Available Help at Discharge:  Family Type of Home: House Home Access: Stairs to enter Secretary/administrator of Steps: 4 Entrance Stairs-Rails: Left Home Layout: One level Bathroom Shower/Tub: Health visitor: Standard Home Adaptive Equipment: Environmental consultant - rolling;Straight cane Prior Function Level of Independence: Requires assistive device for independence;Independent with  transfers;Independent with basic ADLs (used SPC) Able to Take Stairs?: Yes Driving: No ADL  see FIM Vision/Perception  Vision - History Baseline Vision: Wears glasses all the time Patient Visual Report: No change from baseline Vision - Assessment Vision Assessment: Vision not tested Perception Perception: Within Functional Limits Praxis Praxis: Intact  Cognition Overall Cognitive Status: Appears within functional limits for tasks assessed Arousal/Alertness: Awake/alert Orientation Level: Oriented X4 Comments: slow processing Sensation Sensation Light Touch: Appears Intact Stereognosis: Appears Intact Hot/Cold: Appears Intact Proprioception: Impaired by gross assessment Additional Comments: impaired R UE Coordination Gross Motor Movements are Fluid and Coordinated: No Fine Motor Movements are Fluid and Coordinated: No Coordination and Movement Description: slow movements on R UE, decreased/delayed motor planning and processing Finger Nose Finger Test: slower on right Motor  Motor Motor: Hemiplegia;Abnormal postural alignment and control Motor - Skilled Clinical Observations: leans L in sitting, R sided weakness Mobility  Bed Mobility Rolling Left: 3: Mod assist Rolling Left Details (indicate cue type and reason): cues for sequencing, wt shifting, motor planning Left Sidelying to Sit: 3: Mod assist Left Sidelying to Sit Details (indicate cue type and reason): cues for wt shift and technique Sitting - Scoot to Edge of Bed: 3: Mod assist Sitting - Scoot to Edge of Bed Details (indicate cue type and reason): cues for  technqiue, manual facilitation at hips for wt shift Transfers Sit to Stand: 2: Max assist Sit to Stand Details (indicate cue type and reason): cues for hand placement, manual assistance for anterior translation Stand to Sit: 3: Mod assist  Trunk/Postural Assessment  Cervical Assessment Cervical Assessment:  (forward head, decreased AROM Simultaneous filing. User may not have seen previous data.) Thoracic Assessment Thoracic Assessment:  (kyphosis, decreased AROM Simultaneous filing. User may not have seen previous data.) Lumbar Assessment Lumbar Assessment:  (posterior tilt Simultaneous filing. User may not have seen previous data.) Postural Control Postural Control:  (leans L in sitting, decreased strength Simultaneous filing. User may not have seen previous data.)  Balance Static Sitting Balance Static Sitting - Level of Assistance: 4: Min assist Dynamic Sitting Balance Dynamic Sitting - Balance Support: During functional activity Dynamic Sitting - Level of Assistance: 4: Min assist Static Standing Balance Static Standing - Balance Support: During functional activity Static Standing - Level of Assistance: 3: Mod assist Dynamic Standing Balance Dynamic Standing - Balance Support: During functional activity;No upper extremity supported Dynamic Standing - Level of Assistance: 2: Max assist Dynamic Standing - Comments: forward lean, max tactile and verbal cues to maintain upright posture while performing functional task Extremity/Trunk Assessment RUE Assessment RUE Assessment: Exceptions to WFL (slight tone, decrease ROM, 3/5) LUE Assessment LUE Assessment: Within Functional Limits  See FIM for current functional status Refer to Care Plan for Long Term Goals  Recommendations for other services: None  Discharge Criteria: Patient will be discharged from OT if patient refuses treatment 3 consecutive times without medical reason, if treatment goals not met, if there is a change in  medical status, if patient makes no progress towards goals or if patient is discharged from hospital.  The above assessment, treatment plan, treatment alternatives and goals were discussed and mutually agreed upon: by patient  1:1 OT eval: OT role, purpose and goals discussed. Self care retraining: Focus on sit to stands, standing balance, dressing LB, activity tolerance/ endurance, sitting balance, bilateral UE coordination etc.  Roney Mans [facility] 01/29/2012, 12:03 PM

## 2012-01-29 NOTE — Progress Notes (Signed)
Patient information reviewed and entered into [facility] system by Ariyanna Oien, RN, CRRN, PPS Coordinator.  Information including medical coding and functional independence measure will be reviewed and updated through discharge.     Per nursing patient was given "Data Collection Information Summary for Patients in Inpatient Rehabilitation Facilities with attached "Privacy Act Statement-Health Care Records" upon admission.   

## 2012-01-30 ENCOUNTER — Inpatient Hospital Stay (HOSPITAL_COMMUNITY): Payer: Medicare Other

## 2012-01-30 LAB — GLUCOSE, CAPILLARY
Glucose-Capillary: 155 mg/dL — ABNORMAL HIGH (ref 70–99)
Glucose-Capillary: 146 mg/dL — ABNORMAL HIGH (ref 70–99)
Glucose-Capillary: 143 mg/dL — ABNORMAL HIGH (ref 70–99)

## 2012-01-30 MED ORDER — BACLOFEN 5 MG HALF TABLET
5.0000 mg | ORAL_TABLET | Freq: Three times a day (TID) | ORAL | Status: DC
  Administered 2012-01-30 – 2012-02-06 (×22): 5 mg via ORAL
  Filled 2012-01-30 (×25): qty 1

## 2012-01-30 MED ORDER — LEVOFLOXACIN 250 MG PO TABS
250.0000 mg | ORAL_TABLET | Freq: Every day | ORAL | Status: DC
  Administered 2012-01-30 – 2012-02-01 (×3): 250 mg via ORAL
  Filled 2012-01-30 (×4): qty 1

## 2012-01-30 NOTE — Progress Notes (Signed)
Occupational Therapy Session Note  Patient Details  Name: Evan [name] MRN: [id number] Date of Birth: 1931-09-18  Today's Date: 01/30/2012  Session 1 Time: 1030-1130 Time Calculation (min): 60 min Pt denies pain  Short Term Goals: Week 1:  OT Short Term Goal 1 (Week 1): Pt will transfer to [facility] with mod A consistantly with RW OT Short Term Goal 2 (Week 1): Pt will perform toileting with max A OT Short Term Goal 3 (Week 1): Pt will thread pants/ underwear with mod A OT Short Term Goal 4 (Week 1): Pt will stand with mod to perform one grooming task  Skilled Therapeutic Interventions/Progress Updates:    Pt engaged in ADL retraining including bathing and dressing w/c level at sink.  Pt required mod A for stand pivot transfer to w/c.  Pt exhibits decreased function in RUE but able to use RUE throughout ADLs.  Pt requires increased time to perform all tasks.  Pt requires mod A for sit to stand and steady A while standing.  Pt requires tactile cues and verbal cues to correct standing posture.  Extra time and increased assistance required this morning secondary to incontinence of bladder when standing and pt unable to hold urinal while standing.  Pt urinated every time he stood this morning with no awareness.  Extra time required to clean up.    Therapy Documentation Precautions:  Precautions Precautions: Fall Restrictions Weight Bearing Restrictions: No  See FIM for current functional status  Therapy/Group: Individual Therapy  Session 2 Time: 1345-1430 Pt denies pain Individual therapy Pt engaged in standing activities with BUE use to increase activity tolerance, standing balance, and RUE function.  Pt stood for 5 min X 3 during session.  Pt continued to perform task while sitting.  Focus of Rt hand FMS and dexterity, RUE strength, standing balance, and activity tolerance.  Pt required tactile cues to maintain erect standing posture.  Lavone Neri [facility] 01/30/2012, 2:50 PM

## 2012-01-30 NOTE — Progress Notes (Signed)
Physical Therapy Session Note  Patient Details  Name: Evan [name] MRN: [id number] Date of Birth: 09-25-1931  Today's Date: 01/30/2012 Time: 1500-1535 Time Calculation (min): 35 min  Short Term Goals: Week 1:  PT Short Term Goal 1 (Week 1): Pt will perform functional transfers with mod A PT Short Term Goal 2 (Week 1): Pt will demo dynamic standing balance with mod A for functional activity PT Short Term Goal 3 (Week 1): Pt will gait 50' in controlled environment with min A  Skilled Therapeutic Interventions/Progress Updates: Treatment focused on functional transfer training with RW with emphasis on sit to stand technique with repetition initially requiring max A progressing to steady A, cueing for foot placement and maintaining upright posture when in standing. Worked on upright posture in standing with UE task for writing on mirror with no or 1 UE support intermittently with close supervision/steady A. Neuro re- ed to trunk for posture and core/trunk strengthening using weighted medicine ball in PNF diagonals x 10 reps x 2 sets bilaterally. Gait with RW with min A x 30' with cueing for posture and stride length, 1 LOB with mod A to recover during turn due to RLE weakness.     Therapy Documentation Precautions:  Precautions Precautions: Fall Restrictions Weight Bearing Restrictions: No   Pain:  No complaints of pain.   Locomotion : Ambulation Ambulation/Gait Assistance: 4: Min assist   See FIM for current functional status  Therapy/Group: Individual Therapy  Karolee Stamps [facility] 01/30/2012, 3:55 PM

## 2012-01-30 NOTE — Progress Notes (Signed)
Patient has been referred to [facility] by PCP prior to admission.  Will discuss services with patient during inpatient stay. For any additional questions or new referrals please contact Anibal Henderson BSN RN [facility] Liaison at [phone].

## 2012-01-30 NOTE — Progress Notes (Signed)
Patient ID: Evan [name], male   DOB: 30-Jul-1932, 78 y.o.   MRN: [id number] Patient ID: Evan [name], male   DOB: 22-Feb-1932, 78 y.o.   MRN: [id number] Subjective/Complaints: Occasional coughing when eating and drinking. Persistent hiccups. Low grade temp Other ros items negative  Objective: Vital Signs: Blood pressure 141/67, pulse 83, temperature 100.1 F (37.8 C), temperature source Oral, resp. rate 19, SpO2 98.00%. No results found.  Basename 01/29/12 0630  WBC 8.2  HGB 10.1*  HCT 29.8*  PLT 215    Basename 01/29/12 0630 01/28/12 0421  NA 134* 135136  K 4.3 4.44.4  CL 106 110110  CO2 19 18*18*  GLUCOSE 93 8180  BUN 17 1919  CREATININE 1.18 1.161.16  CALCIUM 8.8 8.48.3*   CBG (last 3)   Basename 01/30/12 0731 01/29/12 2110 01/28/12 2101  GLUCAP 121* 146* 176*    Wt Readings from Last 3 Encounters:  01/24/12 76 kg (167 lb 8.8 oz)  01/08/12 78.926 kg (174 lb)  01/01/12 78.926 kg (174 lb)    Physical Exam:  General appearance: alert, cooperative and no distress Head: Normocephalic, without obvious abnormality, atraumatic Eyes: conjunctivae/corneas clear. PERRL, EOM's intact. Fundi benign. Ears: normal TM's and external ear canals both ears Nose: Nares normal. Septum midline. Mucosa normal. No drainage or sinus tenderness. Throat: lips, mucosa, and tongue normal; teeth and gums normal Neck: no adenopathy, no carotid bruit, no JVD, supple, symmetrical, trachea midline and thyroid not enlarged, symmetric, no tenderness/mass/nodules Back: symmetric, no curvature. ROM normal. No CVA tenderness. Resp: clear to auscultation bilaterally wheezes in left lung Cardio: regular rate and rhythm, S1, S2 normal, no murmur, click, rub or gallop GI: soft, non-tender; bowel sounds normal; no masses,  no organomegaly Extremities: extremities normal, atraumatic, no cyanosis or edema Pulses: 2+ and symmetric Skin: Skin color, texture, turgor normal. No rashes or  lesions Neurologic: right side 3+ to 4 upper and 2-3 lower. Left lean. Right central 7 and tongue deviation, speech slightly dysarthric. Persistent hiccups Incision/Wound:  none   Assessment/Plan: 1. Functional deficits secondary to left pontine infarct which require 3+ hours per day of interdisciplinary therapy in a comprehensive inpatient rehab setting. Physiatrist is providing close team supervision and 24 hour management of active medical problems listed below. Physiatrist and rehab team continue to assess barriers to discharge/monitor patient progress toward functional and medical goals. FIM: FIM - Bathing Bathing Steps Patient Completed: Chest;Right Arm;Abdomen;Left upper leg;Right upper leg Bathing: 3: Mod-Patient completes 5-7 47f 10 parts or 50-74%  FIM - Upper Body Dressing/Undressing Upper body dressing/undressing steps patient completed: Thread/unthread right sleeve of pullover shirt/dresss;Put head through opening of pull over shirt/dress;Thread/unthread left sleeve of pullover shirt/dress;Pull shirt over trunk Upper body dressing/undressing: 5: Set-up assist to: Obtain clothing/put away FIM - Lower Body Dressing/Undressing Lower body dressing/undressing: 1: Total-Patient completed less than 25% of tasks  FIM - Toileting Toileting: 1: Total-Patient completed zero steps, helper did all 3  FIM - Diplomatic Services operational officer Devices: Bedside commode Toilet Transfers: 2-To toilet/BSC: Max A (lift and lower assist);2-From toilet/BSC: Max A (lift and lower assist)  FIM - Bed/Chair Transfer Bed/Chair Transfer: 2: Bed > Chair or W/C: Max A (lift and lower assist)  FIM - Locomotion: Wheelchair Distance: 15 Locomotion: Wheelchair: 1: Travels less than 50 ft with maximal assistance (Pt: 25 - 49%) FIM - Locomotion: Ambulation Ambulation/Gait Assistance: 1: +2 Total assist Locomotion: Ambulation: 1: Two helpers  Comprehension Comprehension Mode:  Auditory Comprehension: 5-Follows basic conversation/direction: With extra time/assistive device  Expression Expression Mode: Verbal Expression: 4-Expresses basic 75 - 89% of the time/requires cueing 10 - 24% of the time. Needs helper to occlude trach/needs to repeat words.  Social Interaction Social Interaction: 5-Interacts appropriately 90% of the time - Needs monitoring or encouragement for participation or interaction.  Problem Solving Problem Solving: 5-Solves basic problems: With no assist  Memory Memory: 6-More than reasonable amt of time  1. Left paracentral pontine infarct  2. DVT Prophylaxis/Anticoagulation: Subcutaneous Lovenox. Monitor platelet counts and any signs of bleeding  3. diabetes mellitus. Hemoglobin A1c 5.8. Presently with sliding scale insulin. Patient on Glucotrol 5 mg twice a day prior to admission. Fair control at present. 4. Neuropsych: This patient is capable of making decisions on his/her own behalf.  5. History of renal transplant March of 2012. Continue immunosuppressants medicines as per renal services  6. GERD. Protonix  7. History of prostate cancer. Continue Bactrim 3 times weekly for bladder prophylaxis. Hold for now  -begin levaquin given that ua is positive while on the bactrim. 8. Hiccups: low dose baclofen tid. Be careful not to oversedate.      LOS (Days) 2 A FACE TO FACE EVALUATION WAS PERFORMED  Tobby Fawcett T 01/30/2012, 8:54 AM

## 2012-01-30 NOTE — Procedures (Signed)
Objective Swallowing Evaluation:    Patient Details  Name: Evan [name] MRN: [id number] Date of Birth: 1931/09/10  Today's Date: 01/30/2012 Time: 0900-0930 Time Calculation (min): 30 min  Past Medical History:  Past Medical History  Diagnosis Date  . H/O kidney transplant   . Diabetes mellitus   . Cancer     hx prostate cancer  . ESRD (end stage renal disease) 2011    hemodialysis started Sept 2011, cadaveric (double) renal  transplant Mar 2012 at [facility]   Past Surgical History:  Past Surgical History  Procedure Date  . Kidney transplant   . Prostate surgery approx. 2003    seed implant   HPI:  78 y/o AA male with hx of HTN, DM on oral hypoglycemic , ESRD s/p renal transplant at [facility] in march 2012, hx of duodenal ulcer, prostate adeno ca, Hx of NSTEMI with afib during HD in feb 2012 with cardiac cath showing non obstructive coronaries and normal EF ( was on amiodarone briefly and discharged on aspirin alone after discussion with renal ) presented to ED with slurry speech and bilateral lower extremity weakness since this morning. Patient was sitting at home when he had this symptoms with slurry speech and word finding difficulty. Also had significant weakness of his bilalteral LE. He has chronic weakness of his RLE. He denies any chest pain, SOB, palpitations, headache, dizziness, nausea, blurry vision, vomiting, abdominal pain, bowel or urinary symptoms. Denies fever, chills , diaphoresis, recent change in medications. MRI shows remote right pontine infarct, new left hemorrhagic pontine infarct. Pt administered BSE 01/25/12 and recommended Dys. 3 textures and thin liquids. Pt transferred to CIR 01/28/12.     Recommendation/Prognosis  Clinical Impression: Mild pharyngeal dysphagia Clinical impression: Pt demonstrates a mild sensory based pharyngeal dysphagia characterized by delayed swallow initiation to the pyiform sinuses with thin liquids and moderate valleculae residue and mild  pyiform sinus residue resulting in flash penetration with large, consecutive sips.  Small, single sips and a cued second swallow were effective in eliminating flash penetration and reducing pharyngeal residue. Pt's oral phase characterized by prolonged mastication with decreased bolus manipulation. Recommend pt continue current diet of dys. 3 textures and thin liquids with full supervision for utilization of compensatory strategies.  Swallow Evaluation Recommendations Diet Recommendations: Dysphagia 3 (Mechanical Soft);Thin liquid Liquid Administration via: Cup;Straw (single sips) Medication Administration: Whole meds with liquid Supervision: Patient able to self feed;Full supervision/cueing for compensatory strategies Compensations: Small sips/bites;Multiple dry swallows after each bite/sip Postural Changes and/or Swallow Maneuvers: Seated upright 90 degrees Oral Care Recommendations: Oral care BID Follow up Recommendations:  (TBD) Prognosis Prognosis for Safe Diet Advancement: Good Individuals Consulted Consulted and Agree with Results and Recommendations: Patient Family Member Consulted: daughter and wife  SLP Assessment/Plan  Short Term Goals: Week 1: SLP Short Term Goal 1 (Week 1): Pt will utlize speech intelligibility strategies at the conversation level with supervision verbal and questioning cues.  SLP Short Term Goal 2 (Week 1): Pt will consume regular textures and thin liquids without overt s/s of aspiration with Mod I.  SLP Short Term Goal 3 (Week 1): Pt will utlize swallowing compensatory strategies with supervision verbal cues.   General:  Date of Onset: 01/24/12 HPI: 78 y/o AA male with hx of HTN, DM on oral hypoglycemic , ESRD s/p renal transplant at [facility] in march 2012, hx of duodenal ulcer, prostate adeno ca, Hx of NSTEMI with afib during HD in feb 2012 with cardiac cath showing non obstructive coronaries  and normal EF ( was on amiodarone briefly and discharged on  aspirin alone after discussion with renal ) presented to ED with slurry speech and bilateral lower extremity weakness since this morning. Patient was sitting at home when he had this symptoms with slurry speech and word finding difficulty. Also had significant weakness of his bilalteral LE. He has chronic weakness of his RLE. He denies any chest pain, SOB, palpitations, headache, dizziness, nausea, blurry vision, vomiting, abdominal pain, bowel or urinary symptoms. Denies fever, chills , diaphoresis, recent change in medications. MRI shows remote right pontine infarct, new left hemorrhagic pontine infarct. Pt administered BSE 01/25/12 and recommended Dys. 3 textures and thin liquids. Pt transferred to CIR 01/28/12. Reason for Referral: Objectively evaluate swallowing function Previous Swallow Assessment: Administered BSE 01/29/12 and did not demonstrate overt s/s of aspiration,however, RN and several clinicians report coughing throughout the day. Pt also demonstrating poor management of secretions with constant hiccups throughout evaluation. Recommend MBS to assess for silent aspiration.  Respiratory Status: Room air History of Recent Intubation: No Behavior/Cognition: Alert;Cooperative;Pleasant mood Oral Cavity - Dentition: Missing dentition (pt reports he wears a partial at home) Oral Motor / Sensory Function: Impaired - see Bedside swallow eval Baseline Vocal Quality: Clear Volitional Cough: Strong Volitional Swallow: Able to elicit Anatomy: Within functional limits Pharyngeal Secretions: Normal  Reason for Referral:  Objectively evaluate swallowing function   Oral Phase Prolonged mastication with regular textures  Pharyngeal Phase  Pharyngeal - Thin Pharyngeal - Thin Cup: Pharyngeal residue - valleculae;Delayed swallow initiation;Compensatory strategies attempted (Comment);Penetration/Aspiration during swallow;Pharyngeal residue - pyriform sinuses Penetration/Aspiration details (thin cup):  Material enters airway, remains ABOVE vocal cords then ejected out (with consecutive sips via cup ) Pharyngeal - Solids Pharyngeal - Puree: Pharyngeal residue - valleculae Pharyngeal - Regular: Pharyngeal residue - valleculae  Burlie Cajamarca 01/30/2012, 3:59 PM

## 2012-01-30 NOTE — Care Management Note (Signed)
Per State Regulation 482.30 This chart was reviewed for medical necessity with respect to the patient's Admission/Duration of stay. Yesterday pt & wife given team conf report: ELOS about 3 weeks  S-Min Assist goals.  Wife is available to assist him if he can reach a light assist level.   Brock Ra                 Nurse Care Manager              Next Review Date: 02/05/12

## 2012-01-30 NOTE — Plan of Care (Signed)
Problem: RH BLADDER ELIMINATION Goal: RH STG MANAGE BLADDER WITH ASSISTANCE STG Manage Bladder With Min Assistance  Outcome: Progressing Offering toileting every 3 hrs and prn, condom cath at [facility]

## 2012-01-30 NOTE — Progress Notes (Signed)
Speech Language Pathology Daily Session Note  Patient Details  Name: Evan [name] MRN: [id number] Date of Birth: 1931-12-07  Today's Date: 01/30/2012 Time: 1130-1230 Time Calculation (min): 60 min  Short Term Goals: Week 1: SLP Short Term Goal 1 (Week 1): Pt will utlize speech intelligibility strategies at the conversation level with supervision verbal and questioning cues.  SLP Short Term Goal 2 (Week 1): Pt will consume regular textures and thin liquids without overt s/s of aspiration with Mod I.  SLP Short Term Goal 3 (Week 1): Pt will utlize swallowing compensatory strategies with supervision verbal cues.   Skilled Therapeutic Interventions: Treatment focus on utilization of swallowing compensatory strategies with dys. 3 textures and thin liquids. Pt required Min A verbal and questioning cues to utilize strategies of small sips, multiple swallows and clearing right buccal pocketing.  Pt with cough X 1 throughout meal due to talking while his oral cavity was full.    FIM:  Comprehension Comprehension Mode: Auditory Comprehension: 5-Follows basic conversation/direction: With extra time/assistive device Expression Expression Mode: Verbal Expression: 4-Expresses basic 75 - 89% of the time/requires cueing 10 - 24% of the time. Needs helper to occlude trach/needs to repeat words. Social Interaction Social Interaction: 6-Interacts appropriately with others with medication or extra time (anti-anxiety, antidepressant). Problem Solving Problem Solving: 5-Solves basic 90% of the time/requires cueing < 10% of the time Memory Memory: 6-More than reasonable amt of time FIM - Eating Eating Activity: 5: Supervision/cues;5: Set-up assist for open containers  Pain Pain Assessment Pain Assessment: No/denies pain  Therapy/Group: Individual Therapy  Jonathandavid Marlett 01/30/2012, 4:19 PM

## 2012-01-30 NOTE — Patient Care Conference (Signed)
Inpatient RehabilitationTeam Conference Note Date: 01/29/2012   Time: 2:30 PM    Patient Name: Evan [name]      Medical Record Number: [id number]  Date of Birth: [birth year] Sex: Male         Room/Bed: 4142/4142-01 Payor Info: Payor: MEDICARE  Plan: MEDICARE PART A AND B  Product Type: *No Product type*     Admitting Diagnosis: LT CVA  Admit Date/Time:  01/28/2012  3:49 PM Admission Comments: No comment available   Primary Diagnosis:  CVA (cerebral infarction) Principal Problem: CVA (cerebral infarction)  Patient Active Problem List   Diagnosis Date Noted  . Aortic stenosis 01/28/2012  . CVA (cerebral infarction) 01/28/2012  . Diabetes mellitus type II 01/24/2012  . CVA (cerebral vascular accident) 01/24/2012  . History of renal transplant 01/24/2012  . H/O: duodenal ulcer 01/24/2012  . H/O atrial fibrillation without current medication 01/24/2012  . CA prostate, adenoca 01/24/2012    Expected Discharge Date: Expected Discharge Date: 02/19/12  Team Members Present: Physician: Dr. Faith Rogue Case Manager Present: Melanee Spry, RN Social Worker Present: Amada Jupiter, LCSW Nurse Present: Carmie End, RN PT Present: Reggy Eye, PT Sherrine Maples) OT Present: Bretta Bang, Marye Round, OT SLP Present: Feliberto Gottron, SLP Other (Discipline and Name): Tora Duck, PPS Coordinator     Current Status/Progress Goal Weekly Team Focus  Medical   left pontine infarct with right hemiparesis. coughing, hiccups noted.  encourage swallowing safety, hiccup mgt,  manage post-stroke sequelae, education   Bowel/Bladder             Swallow/Nutrition/ Hydration   Dys. 3 with thin liquids, Full supervision  Mod I  MBSS tomorrow   ADL's   mod to max   supervision  activity tolerance, Use of right UE, standing tolerance and balance   Mobility   max A  supervision  balance, strengthening, activity tolerance   Communication   Min A  Mod I  introduction and utilization of  speech intelligibility strategies.    Safety/Cognition/ Behavioral Observations  at baseline  N/A  N/A   Pain             Skin                *See Interdisciplinary Assessment and Plan and progress notes for long and short-term goals  Barriers to Discharge: right HP, generalized weakness    Possible Resolutions to Barriers:  NMR, strength and balance training, family ed    Discharge Planning/Teaching Needs:  home with wife to provide 24/7 assistance      Team Discussion: For [facility] Wednesday w/ ST.  Wife has her own physical limitations-txs hope pt can reach supervision.   Revisions to Treatment Plan: none    Continued Need for Acute Rehabilitation Level of Care: The patient requires daily medical management by a physician with specialized training in physical medicine and rehabilitation for the following conditions: Daily direction of a multidisciplinary physical rehabilitation program to ensure safe treatment while eliciting the highest outcome that is of practical value to the patient.: Yes Daily medical management of patient stability for increased activity during participation in an intensive rehabilitation regime.: Yes Daily analysis of laboratory values and/or radiology reports with any subsequent need for medication adjustment of medical intervention for : Neurological problems;Cardiac problems;Other  Brock Ra 01/30/2012, 11:39 AM

## 2012-01-30 NOTE — Plan of Care (Signed)
Problem: RH SAFETY Goal: RH STG ADHERE TO SAFETY PRECAUTIONS W/ASSISTANCE/DEVICE STG Adhere to Safety Precautions With Min Assist.  Outcome: Progressing Bed alarm, side rails up x 3, quick release belt on when up in chair/recliner

## 2012-01-31 LAB — URINE CULTURE: Culture  Setup Time: 201306252348

## 2012-01-31 LAB — GLUCOSE, CAPILLARY
Glucose-Capillary: 136 mg/dL — ABNORMAL HIGH (ref 70–99)
Glucose-Capillary: 98 mg/dL (ref 70–99)
Glucose-Capillary: 198 mg/dL — ABNORMAL HIGH (ref 70–99)

## 2012-01-31 LAB — CBC
HCT: 27.9 % — ABNORMAL LOW (ref 39.0–52.0)
Hemoglobin: 9.5 g/dL — ABNORMAL LOW (ref 13.0–17.0)
RBC: 3.5 MIL/uL — ABNORMAL LOW (ref 4.22–5.81)
RDW: 13.9 % (ref 11.5–15.5)
WBC: 5.7 10*3/uL (ref 4.0–10.5)

## 2012-01-31 NOTE — Progress Notes (Signed)
Speech Language Pathology Daily Session Note  Patient Details  Name: ORTON CAPELL MRN: [id number] Date of Birth: 07/29/1932  Today's Date: 01/31/2012 Time: 1130-1230 Time Calculation (min): 60 min  Short Term Goals: Week 1: SLP Short Term Goal 1 (Week 1): Pt will utlize speech intelligibility strategies at the conversation level with supervision verbal and questioning cues.  SLP Short Term Goal 2 (Week 1): Pt will consume regular textures and thin liquids without overt s/s of aspiration with Mod I.  SLP Short Term Goal 3 (Week 1): Pt will utlize swallowing compensatory strategies with supervision verbal cues.   Skilled Therapeutic Interventions: Treatment focus on utilization of swallowing compensatory strategies with Dys. 3 textures and thin liquids. Pt required supervision verbal cues to utilize multiple swallows and liquid washes to clear residuals in right buccal pocket. Pt demonstrated cough X 1 due to talking while oral cavity was full. Pt demonstrating increased vocal intensity during functional conversation.    FIM:  Comprehension Comprehension: 5-Follows basic conversation/direction: With extra time/assistive device Expression Expression: 5-Expresses basic 90% of the time/requires cueing < 10% of the time. Social Interaction Social Interaction: 6-Interacts appropriately with others with medication or extra time (anti-anxiety, antidepressant). Problem Solving Problem Solving: 5-Solves basic 90% of the time/requires cueing < 10% of the time Memory Memory: 6-More than reasonable amt of time FIM - Eating Eating Activity: 5: Supervision/cues  Pain Pain Assessment Pain Assessment: No/denies pain  Therapy/Group: Individual Therapy  Roye Gustafson 01/31/2012, 1:11 PM

## 2012-01-31 NOTE — Progress Notes (Signed)
Physical Therapy Note  Patient Details  Name: Evan [name] MRN: [id number] Date of Birth: [birth year] Today's Date: 01/31/2012  Time: 1030-1100 30 minutes  No c/o pain.  Car transfer with RW, gait to car with min A, steadying assist for SPT to car seat.  Min-mod A to stand from sedan height car, cues for hand placement and safety.  Pt able to get LEs in car without assist.  Sit to stand training with focus on quick sit to stands without losing anterior translation.  Pt requires min manual facilitation for anterior translation with sit to stands, improved with quick repetition.    Individual therapy   Persephone Schriever 01/31/2012, 11:33 AM

## 2012-01-31 NOTE — Progress Notes (Signed)
Social Work  Social Work Assessment and Plan  Patient Details  Name: Evan [name] MRN: [id number] Date of Birth: 03/22/1932  Today's Date: 01/31/2012  Problem List:  Patient Active Problem List  Diagnosis  . Diabetes mellitus type II  . CVA (cerebral vascular accident)  . History of renal transplant  . H/O: duodenal ulcer  . H/O atrial fibrillation without current medication  . CA prostate, adenoca  . Aortic stenosis  . CVA (cerebral infarction)   Past Medical History:  Past Medical History  Diagnosis Date  . H/O kidney transplant   . Diabetes mellitus   . Cancer     hx prostate cancer  . ESRD (end stage renal disease) 2011    hemodialysis started Sept 2011, cadaveric (double) renal  transplant Mar 2012 at [facility]   Past Surgical History:  Past Surgical History  Procedure Date  . Kidney transplant   . Prostate surgery approx. 2003    seed implant   Social History:  reports that he quit smoking about 29 years ago. His smoking use included Cigarettes. He quit after 15 years of use. He has never used smokeless tobacco. He reports that he does not drink alcohol or use illicit drugs.  Family / Support Systems Marital Status: Married How Long?: 62 years Patient Roles: Spouse;Parent Spouse/Significant Other: wife, [name] @ [phone] Children: daughter, Karlyne Greenspan @ (H) [phone] or [phone] Other Supports: grandaughter, Ezequiel Essex @ [phone] Anticipated Caregiver: wife-Jean Ability/Limitations of Caregiver: can provide supervision Caregiver Availability: 24/7 Family Dynamics: daughter and wife note they are all very suportive of patient and plan to provide any assistance needed  Social History Preferred language: English Religion: Methodist Cultural Background: NA Education: Master's level degree plus an Development worker, community from [employer] Read: Yes Write: Yes Employment Status: Retired Date 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 Denies any significant emotional distress.  Family confirms no s/s of depression or anxiety (and no h/O), therefore, depression screening deferred at this time but will monitor throughout CIR stay. Recent Psychosocial Issues: None Pyschiatric History: None Substance Abuse History: None  Patient / Family Perceptions, Expectations & Goals Pt/Family understanding of illness & functional limitations: Pt, wife and daughter present with good, basic understanding of pt's stroke and resulting functional deficits.  Aware he will likely requrie some level of assistance at d/c which, wife points out, she was doing PTA. Premorbid pt/family roles/activities: Wife notes that she provided some light assistance with pt's ADLs as needed.  Most of home management needs also done by wife.  She notes that pt was somewhat sedentary despite family's encoutagement to increase level of activity Anticipated changes in roles/activities/participation: Little changed anticipated as wife was assisting PTA. Pt/family expectations/goals: "I'd like for my speech to get better"  [employer]: None Premorbid Home Care/DME Agencies: None Transportation available at discharge: yes Resource referrals recommended: Support group (specify) (Stroke Support Group)  Discharge Planning Living Arrangements: Spouse/significant other Support Systems: Spouse/significant other;Children;Church/faith community;Friends/neighbors Type of Residence: Private residence Community education officer Resources: Engineer, structural (specify) Chief Executive Officer) Financial Resources: Restaurant manager, fast food Screen Referred: No Living Expenses: Own Money Management: Spouse Do you have any problems obtaining your medications?:  No Home Management: wife Patient/Family Preliminary Plans: Pt plans to d/c home with wife as primary caregiver.  Daughter is retired and available to assist prn. 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  Here after having suffered a stroke and with wife and daughter at bedside.  Family appears very supportive and pt denies any significant emotional distress. Anticipate good gains and short LOS.  Will follow for support and d/c planning.  Amada Jupiter  Tonga Prout 01/31/2012, 3:52 PM

## 2012-01-31 NOTE — Progress Notes (Signed)
Occupational Therapy Session Note  Patient Details  Name: Evan [name] MRN: [id number] Date of Birth: 1931/09/09  Today's Date: 01/31/2012 Time: 4098-1191 Time Calculation (min): 60 min  Short Term Goals: Week 1:  OT Short Term Goal 1 (Week 1): Pt will transfer to [facility] with mod A consistantly with RW OT Short Term Goal 2 (Week 1): Pt will perform toileting with max A OT Short Term Goal 3 (Week 1): Pt will thread pants/ underwear with mod A OT Short Term Goal 4 (Week 1): Pt will stand with mod to perform one grooming task  Skilled Therapeutic Interventions/Progress Updates:    Pt engaged in dressing tasks only.  Pt stated nursing had already "washed him" all he needed to do was dress.  Pt continues to exhibit bladder incontinence when standing. Pt requires increased time to complete dressing tasks.  Pt requires increased time to complete dressing tasks. Pt performed sit to stand at sink with steady assist and able to stand at sink for approx 5 mins while urinating in urinal.  Pt continues to requires considerable assistance with LB dressing.  Focus on activity tolerance, dynamic standing balance, and safety awareness.  Therapy Documentation Precautions:  Precautions Precautions: Fall Restrictions Weight Bearing Restrictions: No   Pain: Pain Assessment Pain Assessment: No/denies pain Pain Score: 0-No pain Patients Stated Pain Goal: 3 Multiple Pain Sites: No  See FIM for current functional status  Therapy/Group: Individual Therapy  Rich Brave 01/31/2012, 10:48 AM

## 2012-01-31 NOTE — Progress Notes (Signed)
Subjective/Complaints: Temps trending down. Hiccups gone with baclofen- he's very pleased. Other ros items negative  Objective: Vital Signs: Blood pressure 145/79, pulse 71, temperature 97.6 F (36.4 C), temperature source Oral, resp. rate 19, height 5' 10.87" (1.8 m), weight 78.835 kg (173 lb 12.8 oz), SpO2 99.00%. Dg Swallowing Func-no Report  01/30/2012  CLINICAL DATA: dysphagia   FLUOROSCOPY FOR SWALLOWING FUNCTION STUDY:  Fluoroscopy was provided for swallowing function study, which was  administered by a speech pathologist.  Final results and recommendations  from this study are contained within the speech pathology report.      Basename 01/31/12 0555 01/29/12 0630  WBC 5.7 8.2  HGB 9.5* 10.1*  HCT 27.9* 29.8*  PLT 176 215    Basename 01/29/12 0630  NA 134*  K 4.3  CL 106  CO2 19  GLUCOSE 93  BUN 17  CREATININE 1.18  CALCIUM 8.8   CBG (last 3)   Basename 01/31/12 0709 01/30/12 2136 01/30/12 1635  GLUCAP 98 136* 143*    Wt Readings from Last 3 Encounters:  01/30/12 78.835 kg (173 lb 12.8 oz)  01/24/12 76 kg (167 lb 8.8 oz)  01/08/12 78.926 kg (174 lb)    Physical Exam:  General appearance: alert, cooperative and no distress Head: Normocephalic, without obvious abnormality, atraumatic Eyes: conjunctivae/corneas clear. PERRL, EOM's intact. Fundi benign. Ears: normal TM's and external ear canals both ears Nose: Nares normal. Septum midline. Mucosa normal. No drainage or sinus tenderness. Throat: lips, mucosa, and tongue normal; teeth and gums normal Neck: no adenopathy, no carotid bruit, no JVD, supple, symmetrical, trachea midline and thyroid not enlarged, symmetric, no tenderness/mass/nodules Back: symmetric, no curvature. ROM normal. No CVA tenderness. Resp: clear to auscultation bilaterally wheezes in left lung Cardio: regular rate and rhythm, S1, S2 normal, no murmur, click, rub or gallop GI: soft, non-tender; bowel sounds normal; no masses,  no  organomegaly Extremities: extremities normal, atraumatic, no cyanosis or edema Pulses: 2+ and symmetric Skin: Skin color, texture, turgor normal. No rashes or lesions Neurologic: right side 3+ to 4 upper and 2-3 lower. Left lean. Right central 7 and tongue deviation, speech slightly dysarthric. Persistent hiccups Incision/Wound:  none   Assessment/Plan: 1. Functional deficits secondary to left pontine infarct which require 3+ hours per day of interdisciplinary therapy in a comprehensive inpatient rehab setting. Physiatrist is providing close team supervision and 24 hour management of active medical problems listed below. Physiatrist and rehab team continue to assess barriers to discharge/monitor patient progress toward functional and medical goals. FIM: FIM - Bathing Bathing Steps Patient Completed: Chest;Right Arm;Left Arm;Abdomen;Right upper leg;Left upper leg Bathing: 3: Mod-Patient completes 5-7 36f 10 parts or 50-74%  FIM - Upper Body Dressing/Undressing Upper body dressing/undressing steps patient completed: Thread/unthread right sleeve of pullover shirt/dresss;Thread/unthread left sleeve of pullover shirt/dress Upper body dressing/undressing: 3: Mod-Patient completed 50-74% of tasks FIM - Lower Body Dressing/Undressing Lower body dressing/undressing: 1: Total-Patient completed less than 25% of tasks  FIM - Toileting Toileting: 1: Total-Patient completed zero steps, helper did all 3  FIM - Diplomatic Services operational officer Devices: Bedside commode Toilet Transfers: 2-To toilet/BSC: Max A (lift and lower assist);2-From toilet/BSC: Max A (lift and lower assist)  FIM - Bed/Chair Transfer Bed/Chair Transfer: 3: Bed > Chair or W/C: Mod A (lift or lower assist);2: Chair or W/C > Bed: Max A (lift and lower assist)  FIM - Locomotion: Wheelchair Distance: 15 Locomotion: Wheelchair: 1: Total Assistance/staff pushes wheelchair (Pt<25%) FIM - Locomotion: Ambulation Locomotion:  Ambulation Assistive  Devices: Walker - Rolling Ambulation/Gait Assistance: 4: Min assist Locomotion: Ambulation: 1: Travels less than 50 ft with minimal assistance (Pt.>75%)  Comprehension Comprehension Mode: Auditory Comprehension: 5-Follows basic conversation/direction: With extra time/assistive device  Expression Expression Mode: Verbal Expression: 4-Expresses basic 75 - 89% of the time/requires cueing 10 - 24% of the time. Needs helper to occlude trach/needs to repeat words.  Social Interaction Social Interaction: 5-Interacts appropriately 90% of the time - Needs monitoring or encouragement for participation or interaction.  Problem Solving Problem Solving: 4-Solves basic 75 - 89% of the time/requires cueing 10 - 24% of the time  Memory Memory: 6-More than reasonable amt of time  1. Left paracentral pontine infarct  2. DVT Prophylaxis/Anticoagulation: Subcutaneous Lovenox. Monitor platelet counts and any signs of bleeding  3. diabetes mellitus. Hemoglobin A1c 5.8. Presently with sliding scale insulin. Patient on Glucotrol 5 mg twice a day prior to admission. Fair control at present. 4. Neuropsych: This patient is capable of making decisions on his/her own behalf.  5. History of renal transplant March of 2012. Continue immunosuppressants medicines as per renal services  6. GERD. Protonix  7. History of prostate cancer. Continue Bactrim 3 times weekly for bladder prophylaxis. Hold for now  -bactrim held  -levaquin initiated, urine positive for e coli. Await sensitivites today, change abx if possible 8. Hiccups: low dose baclofen tid. Patient is tolerating and hiccups have stopped.     LOS (Days) 3 A FACE TO FACE EVALUATION WAS PERFORMED  Evan [name] T 01/31/2012, 9:00 AM

## 2012-01-31 NOTE — Progress Notes (Signed)
Physical Therapy Note  Patient Details  Name: Evan [name] MRN: [id number] Date of Birth: 12-20-1931 Today's Date: 01/31/2012  1415-1515 60 minutes  No c/o pain.  Gait training with obstacle negotiation with min A, occasional mod A due to R knee buckling.  Manual facilitation for wt shifts, verbal cues for increased step and stride length and picking feet up vs shuffling, tactile cues for upright posture.  Pt with increased energy use during obstacle negotiation, required longer rest breaks to recover.  Tapping and stepping over objects with RW with min A, cues for foot clearance and safety with stepping backwards.  Standing marches to encourage increased hip flexion during gait to increase step length and decrease shuffling.  Bathroom negotiation, balance and transfers with RW with min-mod A.  Cues for sidestepping and RW negotiation in tight spaces, mod A to stand from low toilet seat.  Pt total A for pants up.  Gait training controlled environment and SPT training for activity tolerance and repetition of turns with RW with min A, cues for wt shifts and safety.  Pt with good motivation to participate.  Individual therapy   Neola Worrall 01/31/2012, 4:23 PM

## 2012-02-01 DIAGNOSIS — I6330 Cerebral infarction due to thrombosis of unspecified cerebral artery: Secondary | ICD-10-CM

## 2012-02-01 DIAGNOSIS — Z5189 Encounter for other specified aftercare: Secondary | ICD-10-CM

## 2012-02-01 LAB — GLUCOSE, CAPILLARY
Glucose-Capillary: 109 mg/dL — ABNORMAL HIGH (ref 70–99)
Glucose-Capillary: 155 mg/dL — ABNORMAL HIGH (ref 70–99)

## 2012-02-01 MED ORDER — CEPHALEXIN 250 MG PO CAPS
250.0000 mg | ORAL_CAPSULE | Freq: Three times a day (TID) | ORAL | Status: AC
  Administered 2012-02-01 – 2012-02-05 (×15): 250 mg via ORAL
  Filled 2012-02-01 (×16): qty 1

## 2012-02-01 NOTE — Progress Notes (Signed)
Speech Language Pathology Daily Session Note  Patient Details  Name: JAMILL WETMORE MRN: [id number] Date of Birth: 1932-02-18  Today's Date: 02/01/2012 Time: 1130-1230 Time Calculation (min): 60 min  Short Term Goals: Week 1: SLP Short Term Goal 1 (Week 1): Pt will utlize speech intelligibility strategies at the conversation level with supervision verbal and questioning cues.  SLP Short Term Goal 2 (Week 1): Pt will consume regular textures and thin liquids without overt s/s of aspiration with Mod I.  SLP Short Term Goal 3 (Week 1): Pt will utlize swallowing compensatory strategies with supervision verbal cues.   Skilled Therapeutic Interventions: Treatment focus on utilization of swallowing compensatory strategies. Pt required Min A question and semantic cues to utilize small, single sips via straw and multiple swallows with each bite/sip. Pt without overt s/s of aspiration. Pt independently managing secretions throughout entire session which increased pt's overall speech intelligibility, however, pt required supervision verbal cues  to increase vocal intensity at the conversation level.    FIM:  Comprehension Comprehension: 5-Follows basic conversation/direction: With extra time/assistive device Expression Expression Mode: Verbal Expression: 5-Expresses complex 90% of the time/cues < 10% of the time Social Interaction Social Interaction: 6-Interacts appropriately with others with medication or extra time (anti-anxiety, antidepressant). Problem Solving Problem Solving: 5-Solves basic 90% of the time/requires cueing < 10% of the time Memory Memory: 5-Recognizes or recalls 90% of the time/requires cueing < 10% of the time FIM - Eating Eating Activity: 5: Supervision/cues  Pain Pain Assessment Pain Assessment: No/denies pain  Therapy/Group: Individual Therapy  Nicholis Stepanek 02/01/2012, 2:02 PM

## 2012-02-01 NOTE — Progress Notes (Signed)
Physical Therapy Note  Patient Details  Name: Evan [name] MRN: [id number] Date of Birth: Feb 10, 1932 Today's Date: 02/01/2012  Time: 1430-1503 33 minutes  No c/o pain.  Treatment focused on gait and pregait training with focus on foot clearance vs shuffling gait.  Pre gait tapping and stepping over objects with RW with manual facilitation for wt shifts, verbal and visual cues for foot clearance.  Pt fatigues easily with this, multiple episodes of R knee buckling requiring mod A to correct balance.  Gait 3 x 40' with RW with manual facilitation for wt shifting to encourage increased step length bilaterally.  Pt with some increased step length but returns to shuffling when fatigued.  Discussed with pt possible need for w/c mobility at home since knee buckling was happening prior to admission and had caused falls (Pt's wife unable to physically assist).  Pt states he hopes he doesn't have to do that but that w/c would fit in his home.  W/c mobility with min A due to weak R UE 35'.  Individual therapy   Kaijah Abts 02/01/2012, 3:09 PM

## 2012-02-01 NOTE — Progress Notes (Signed)
Occupational Therapy Session Note  Patient Details  Name: Evan [name] MRN: [id number] Date of Birth: May 18, 1932  Today's Date: 02/01/2012 Time: 9629-5284 Time Calculation (min): 60 min  Short Term Goals: Week 1:  OT Short Term Goal 1 (Week 1): Pt will transfer to [facility] with mod A consistantly with RW OT Short Term Goal 2 (Week 1): Pt will perform toileting with max A OT Short Term Goal 3 (Week 1): Pt will thread pants/ underwear with mod A OT Short Term Goal 4 (Week 1): Pt will stand with mod to perform one grooming task  Skilled Therapeutic Interventions/Progress Updates:   Pt on toilet but stated he didn't have bowel movement.  Stand pivot transfer with RW at min A.  Pt continues to void bladder when standing.  Focus on dressing.  Nursing assisted with bathing earlier.  Pt donned shirt this morning with extra time.  Introduced AE (reacher and sock aid) for LB dressing.  Pt required assistance pulling up pants when standing.  Pt requires verbal cues for correct UE/LE positioning prior to standing.    Therapy Documentation Precautions:  Precautions Precautions: Fall Restrictions Weight Bearing Restrictions: No   Pain: Pain Assessment Pain Assessment: No/denies pain  See FIM for current functional status  Therapy/Group: Individual Therapy  Rich Brave 02/01/2012, 11:22 AM

## 2012-02-01 NOTE — Care Management (Signed)
Per State Regulation 482.30 This chart was reviewed for medical necessity with respect to the patient's Admission/Duration of stay. Brock Ra                 Nurse Care Manager             Next Review Date: 02/05/12

## 2012-02-01 NOTE — Progress Notes (Signed)
Patient ID: Evan [name], male   DOB: [birth year], 78 y.o.   MRN: [id number]  Subjective/Complaints: No new complaints Other ros items negative  Objective: Vital Signs: Blood pressure 152/70, pulse 69, temperature 98.1 F (36.7 C), temperature source Oral, resp. rate 19, height 5' 10.87" (1.8 m), weight 78.835 kg (173 lb 12.8 oz), SpO2 99.00%. Dg Swallowing Func-no Report  01/30/2012  CLINICAL DATA: dysphagia   FLUOROSCOPY FOR SWALLOWING FUNCTION STUDY:  Fluoroscopy was provided for swallowing function study, which was  administered by a speech pathologist.  Final results and recommendations  from this study are contained within the speech pathology report.      Basename 01/31/12 0555  WBC 5.7  HGB 9.5*  HCT 27.9*  PLT 176   No results found for this basename: NA:2,K:2,CL:2,CO2:2,GLUCOSE:2,BUN:2,CREATININE:2,CALCIUM:2 in the last 72 hours CBG (last 3)   Basename 02/01/12 0706 01/31/12 2132 01/31/12 1730  GLUCAP 109* 198* 176*    Wt Readings from Last 3 Encounters:  01/30/12 78.835 kg (173 lb 12.8 oz)  01/24/12 76 kg (167 lb 8.8 oz)  01/08/12 78.926 kg (174 lb)    Physical Exam:  General appearance: alert, cooperative and no distress Head: Normocephalic, without obvious abnormality, atraumatic Eyes: conjunctivae/corneas clear. PERRL, EOM's intact. Fundi benign. Ears: normal TM's and external ear canals both ears Nose: Nares normal. Septum midline. Mucosa normal. No drainage or sinus tenderness. Throat: lips, mucosa, and tongue normal; teeth and gums normal Neck: no adenopathy, no carotid bruit, no JVD, supple, symmetrical, trachea midline and thyroid not enlarged, symmetric, no tenderness/mass/nodules Back: symmetric, no curvature. ROM normal. No CVA tenderness. Resp: clear to auscultation bilaterally wheezes in left lung Cardio: regular rate and rhythm, S1, S2 normal, no murmur, click, rub or gallop GI: soft, non-tender; bowel sounds normal; no masses,  no  organomegaly Extremities: extremities normal, atraumatic, no cyanosis or edema Pulses: 2+ and symmetric Skin: Skin color, texture, turgor normal. No rashes or lesions Neurologic: right side 3+ to 4 upper and 2-3 lower. Left lean. Right central 7 and tongue deviation, speech slightly dysarthric. No hiccups Incision/Wound:  none   Assessment/Plan: 1. Functional deficits secondary to left pontine infarct which require 3+ hours per day of interdisciplinary therapy in a comprehensive inpatient rehab setting. Physiatrist is providing close team supervision and 24 hour management of active medical problems listed below. Physiatrist and rehab team continue to assess barriers to discharge/monitor patient progress toward functional and medical goals. FIM: FIM - Bathing Bathing Steps Patient Completed: Left Arm;Chest Bathing: 2: Max-Patient completes 3-4 66f 10 parts or 25-49%  FIM - Upper Body Dressing/Undressing Upper body dressing/undressing steps patient completed: Thread/unthread left sleeve of pullover shirt/dress;Put head through opening of pull over shirt/dress Upper body dressing/undressing: 3: Mod-Patient completed 50-74% of tasks FIM - Lower Body Dressing/Undressing Lower body dressing/undressing: 1: Total-Patient completed less than 25% of tasks  FIM - Toileting Toileting: 1: Total-Patient completed zero steps, helper did all 3  FIM - Diplomatic Services operational officer Devices: Bedside commode Toilet Transfers: 2-To toilet/BSC: Max A (lift and lower assist);2-From toilet/BSC: Max A (lift and lower assist)  FIM - Bed/Chair Transfer Bed/Chair Transfer: 3: Chair or W/C > Bed: Mod A (lift or lower assist);3: Bed > Chair or W/C: Mod A (lift or lower assist)  FIM - Locomotion: Wheelchair Distance: 15 Locomotion: Wheelchair: 1: Total Assistance/staff pushes wheelchair (Pt<25%) FIM - Locomotion: Ambulation Locomotion: Ambulation Assistive Devices: Dealer Ambulation/Gait Assistance: 4: Min assist Locomotion: Ambulation: 1: Travels less than 50 ft  with minimal assistance (Pt.>75%)  Comprehension Comprehension Mode: Auditory Comprehension: 5-Follows basic conversation/direction: With no assist  Expression Expression Mode: Verbal Expression: 5-Expresses complex 90% of the time/cues < 10% of the time  Social Interaction Social Interaction: 6-Interacts appropriately with others with medication or extra time (anti-anxiety, antidepressant).  Problem Solving Problem Solving: 5-Solves basic 90% of the time/requires cueing < 10% of the time  Memory Memory: 6-More than reasonable amt of time  1. Left paracentral pontine infarct  2. DVT Prophylaxis/Anticoagulation: Subcutaneous Lovenox. Monitor platelet counts and any signs of bleeding  3. diabetes mellitus. Hemoglobin A1c 5.8. Presently with sliding scale insulin. Patient on Glucotrol 5 mg twice a day prior to admission. Fair control at present. 4. Neuropsych: This patient is capable of making decisions on his/her own behalf.  5. History of renal transplant March of 2012. Continue immunosuppressants medicines as per renal services  6. GERD. Protonix  7. History of prostate cancer. Continue Bactrim 3 times weekly for bladder prophylaxis. Hold for now  -bactrim held  -ecoli uti sens to levaquin but also keflex- will change given potential interactions. 8. Hiccups: low dose baclofen tid. Patient is tolerating and hiccups have stopped.     LOS (Days) 4 A FACE TO FACE EVALUATION WAS PERFORMED  Sallie Staron T 02/01/2012, 9:12 AM

## 2012-02-01 NOTE — Progress Notes (Signed)
Physical Therapy Session Note  Patient Details  Name: Evan [name] MRN: [id number] Date of Birth: [birth year]  Today's Date: 02/01/2012 Time: 4098-1191 Time Calculation (min): 30 min  Short Term Goals: Week 1:  PT Short Term Goal 1 (Week 1): Pt will perform functional transfers with mod A PT Short Term Goal 2 (Week 1): Pt will demo dynamic standing balance with mod A for functional activity PT Short Term Goal 3 (Week 1): Pt will gait 50' in controlled environment with min A  Skilled Therapeutic Interventions/Progress Updates:    Sit <> stand efficiency training with focus on active anterior pelvic tilt in sitting to shift COG forward over BOS and maintain during forward trunk lean pushing therapist away to activate extensors during multiple sit <> stand from elevated mat with mod A overall; in standing with reaching up and to the L and in squat position for increased activation of RLE quads/glutes performed L and R lateral weight shifting with focus on activation of RLE extensors for active weight shift to L side; at end of session patient reporting increased R knee fatigue and noted to have increased R lateral lean and knee collapse during WB; transfer back to w/c with squat pivot to R side with mod A  Therapy Documentation Precautions:  Precautions Precautions: Fall Restrictions Weight Bearing Restrictions: No Pain: Pain Assessment Pain Assessment: No/denies pain  See FIM for current functional status  Therapy/Group: Individual Therapy  Edman Circle [facility] 02/01/2012, 4:28 PM

## 2012-02-01 NOTE — Progress Notes (Signed)
Physical Therapy Note  Patient Details  Name: MAJESTY OEHLERT MRN: [id number] Date of Birth: 06-11-1932 Today's Date: 02/01/2012  Time: 1030-1100 30 minutes  No c/o pain.  Supine NMR for trunk activation and control with bridging, LTR, mini crunches to activate core musculature.  Pt with significant weakness in upper and lower abdominals, good performance with bridging and LTR.  Bed mobility with tactile cues to facilitate abdominal muscular activation during supine <> sit.  Gait training 25', 10' with focus on increased step length, manual facilitation for wt shifts.  Multiple episodes of R knee buckling this morning requiring mod A to correct.  Pt reports this happened prior to admission.  Sit to stand training with focus on anterior wt shift and using B UEs to push from mat/wc.  Pt able to perform with supervision using this technique.  Individual therapy   Cindi Ghazarian 02/01/2012, 11:37 AM

## 2012-02-01 NOTE — Consult Note (Addendum)
Inpatient Diabetes Program Recommendations  AACE/ADA: New Consensus Statement on Inpatient Glycemic Control (2009)  Target Ranges:  Prepandial:   less than 140 mg/dL      Peak postprandial:   less than 180 mg/dL (1-2 hours)      Critically ill patients:  140 - 180 mg/dL   Reason for Visit: Discharge question regarding dm meds Noted sticky note to MD regarding question as to pt being discharged on Glucotrol or insulin.  HgbA1C at 5.8% Pt should not need insulin at discharge with that A1C. However, using correction insulin while here is recommended due to its short duration of action.  Glucotrol could be unsafe to use while here due to its long duration of action and potential for hypoglycemia in the case that the patient may not eat enough.   Note: Thank you, Lenor Coffin, RN, CNS, Diabetes Coordinator [phone])  In addition, it appears that pt is unable to eat as he use to prior to the CVA that pt may not be eating as well.  May need to reduce Glucotrol to once per day at discharge.

## 2012-02-02 DIAGNOSIS — I6330 Cerebral infarction due to thrombosis of unspecified cerebral artery: Secondary | ICD-10-CM

## 2012-02-02 DIAGNOSIS — Z5189 Encounter for other specified aftercare: Secondary | ICD-10-CM

## 2012-02-02 LAB — GLUCOSE, CAPILLARY: Glucose-Capillary: 160 mg/dL — ABNORMAL HIGH (ref 70–99)

## 2012-02-02 NOTE — Progress Notes (Signed)
Occupational Therapy Session Note  Patient Details  Name: Evan [name] MRN: [id number] Date of Birth: 1931/08/28  Today's Date: 02/02/2012 Time: 1215-1245 Time Calculation (min): 30 min   Skilled Therapeutic Interventions/Progress Updates: approprioate use of bilateral integration for self feeding today during [place]     Therapy Documentation Precautions:  Precautions Precautions: Fall Restrictions Weight Bearing Restrictions: No  Pain:denied   See FIM for current functional status  Therapy/Group: Group Therapy  Rozelle Logan 02/02/2012, 4:29 PM

## 2012-02-02 NOTE — Progress Notes (Signed)
Occupational Therapy Session Note  Patient Details  Name: NICCO REAUME MRN: [id number] Date of Birth: 17-Feb-1932  Today's Date: 02/02/2012 Time: 7829-5621 Time Calculation (min): 45 min  Short Term Goals: Week 1:  OT Short Term Goal 1 (Week 1): Pt will transfer to [facility] with mod A consistantly with RW OT Short Term Goal 2 (Week 1): Pt will perform toileting with max A OT Short Term Goal 3 (Week 1): Pt will thread pants/ underwear with mod A OT Short Term Goal 4 (Week 1): Pt will stand with mod to perform one grooming task  Skilled Therapeutic Interventions/Progress Updates:    1:1 Pt still eating breakfast when arrived: focused on posture while eating and using safe strategies for eating from the SLP.  Self care retraining (bathing "hot spots" and dressing): with focus on transitional movements to come to EOB without using bed rail- needed A at the hips to come forward to EOB; sit to stands with mod A for anterior pelvic weight shift, functional ambulation into bathroom, toilet transfer with mod a (needing the A for sit to stand, use of reacher to thread pants, standing balance to pull up pants with only 1 UE support. Donned brief with total A secondary to constant leaking of urine. Donned shirt with extra time and setup.   Therapy Documentation Precautions:  Precautions Precautions: Fall Restrictions Weight Bearing Restrictions: No Pain:  no c/ o pain  See FIM for current functional status  Therapy/Group: Individual Therapy  Roney Mans [facility] 02/02/2012, 10:45 AM

## 2012-02-02 NOTE — Progress Notes (Signed)
Patient ID: Evan [name], male   DOB: [birth year], 78 y.o.   MRN: [id number] Taking 75-100% meals Subjective/Complaints: No new complaints.  CBGs were never above 120 on glucotrol at home Other ros items negative  Objective: Vital Signs: Blood pressure 173/74, pulse 62, temperature 97.8 F (36.6 C), temperature source Oral, resp. rate 19, height 5' 10.87" (1.8 m), weight 78.835 kg (173 lb 12.8 oz), SpO2 99.00%. No results found.  Basename 01/31/12 0555  WBC 5.7  HGB 9.5*  HCT 27.9*  PLT 176   No results found for this basename: NA:2,K:2,CL:2,CO2:2,GLUCOSE:2,BUN:2,CREATININE:2,CALCIUM:2 in the last 72 hours CBG (last 3)   Basename 02/01/12 2306 02/01/12 1118 02/01/12 0706  GLUCAP 168* 155* 109*    Wt Readings from Last 3 Encounters:  01/30/12 78.835 kg (173 lb 12.8 oz)  01/24/12 76 kg (167 lb 8.8 oz)  01/08/12 78.926 kg (174 lb)    Physical Exam:  General appearance: alert, cooperative and no distress Head: Normocephalic, without obvious abnormality, atraumatic Eyes: conjunctivae/corneas clear. PERRL, EOM's intact. Fundi benign. Ears: normal TM's and external ear canals both ears Nose: Nares normal. Septum midline. Mucosa normal. No drainage or sinus tenderness. Throat: lips, mucosa, and tongue normal; teeth and gums normal Neck: no adenopathy, no carotid bruit, no JVD, supple, symmetrical, trachea midline and thyroid not enlarged, symmetric, no tenderness/mass/nodules Back: symmetric, no curvature. ROM normal. No CVA tenderness. Resp: clear to auscultation bilaterally wheezes in left lung Cardio: regular rate and rhythm, S1, S2 normal, no murmur, click, rub or gallop GI: soft, non-tender; bowel sounds normal; no masses,  no organomegaly Extremities: extremities normal, atraumatic, no cyanosis or edema Pulses: 2+ and symmetric Skin: Skin color, texture, turgor normal. No rashes or lesions Neurologic: right side 3+ to 4 upper and 2-3 lower. Left lean. Right central 7 and  tongue deviation, speech slightly dysarthric. No hiccups Incision/Wound:  none   Assessment/Plan: 1. Functional deficits secondary to left pontine infarct which require 3+ hours per day of interdisciplinary therapy in a comprehensive inpatient rehab setting. Physiatrist is providing close team supervision and 24 hour management of active medical problems listed below. Physiatrist and rehab team continue to assess barriers to discharge/monitor patient progress toward functional and medical goals. FIM: FIM - Bathing Bathing Steps Patient Completed: Left Arm Bathing: 2: Max-Patient completes 3-4 54f 10 parts or 25-49%  FIM - Upper Body Dressing/Undressing Upper body dressing/undressing steps patient completed: Thread/unthread right sleeve of pullover shirt/dresss;Thread/unthread left sleeve of pullover shirt/dress;Put head through opening of pull over shirt/dress;Pull shirt over trunk Upper body dressing/undressing: 5: Supervision: Safety issues/verbal cues FIM - Lower Body Dressing/Undressing Lower body dressing/undressing steps patient completed: Thread/unthread right pants leg;Thread/unthread left pants leg;Don/Doff right sock Lower body dressing/undressing: 2: Max-Patient completed 25-49% of tasks  FIM - Toileting Toileting: 1: Total-Patient completed zero steps, helper did all 3  FIM - Diplomatic Services operational officer Devices: Building control surveyor Transfers: 2-From toilet/BSC: Max A (lift and lower assist)  FIM - Bed/Chair Transfer Bed/Chair Transfer: 4: Chair or W/C > Bed: Min A (steadying Pt. > 75%);4: Bed > Chair or W/C: Min A (steadying Pt. > 75%)  FIM - Locomotion: Wheelchair Distance: 15 Locomotion: Wheelchair: 1: Travels less than 50 ft with minimal assistance (Pt.>75%) FIM - Locomotion: Ambulation Locomotion: Ambulation Assistive Devices: Designer, industrial/product Ambulation/Gait Assistance: 4: Min assist Locomotion: Ambulation: 1: Travels less than 50 ft with  minimal assistance (Pt.>75%)  Comprehension Comprehension Mode: Auditory Comprehension: 5-Follows basic conversation/direction: With no assist  Expression Expression Mode: Verbal  Expression: 5-Expresses complex 90% of the time/cues < 10% of the time  Social Interaction Social Interaction: 6-Interacts appropriately with others with medication or extra time (anti-anxiety, antidepressant).  Problem Solving Problem Solving: 5-Solves basic 90% of the time/requires cueing < 10% of the time  Memory Memory: 5-Recognizes or recalls 90% of the time/requires cueing < 10% of the time  1. Left paracentral pontine infarct  2. DVT Prophylaxis/Anticoagulation: Subcutaneous Lovenox. Monitor platelet counts and any signs of bleeding  3. diabetes mellitus. Hemoglobin A1c 5.8. Presently with sliding scale insulin. Patient on Glucotrol 5 mg twice a day prior to admission. Will resume am dose Fair control at present. 4. Neuropsych: This patient is capable of making decisions on his/her own behalf.  5. History of renal transplant March of 2012. Continue immunosuppressants medicines as per renal services  6. GERD. Protonix  7. History of prostate cancer. Continue Bactrim 3 times weekly for bladder prophylaxis. Hold for now  -bactrim held  -ecoli uti sens to levaquin but also keflex- will change given potential interactions. 8. Hiccups: low dose baclofen tid. Patient is tolerating and hiccups have stopped.     LOS (Days) 5 A FACE TO FACE EVALUATION WAS PERFORMED  Evan [name] E 02/02/2012, 10:07 AM

## 2012-02-02 NOTE — Progress Notes (Signed)
Speech Language Pathology Daily Session Note  Patient Details  Name: Evan [name] MRN: [id number] Date of Birth: March 12, 1932  Today's Date: 02/02/2012 Time: 4098-1191 Time Calculation (min): 30 min  Short Term Goals: Week 1: SLP Short Term Goal 1 (Week 1): Pt will utlize speech intelligibility strategies at the conversation level with supervision verbal and questioning cues.  SLP Short Term Goal 2 (Week 1): Pt will consume regular textures and thin liquids without overt s/s of aspiration with Mod I.  SLP Short Term Goal 2 - Progress (Week 1): Progressing toward goal SLP Short Term Goal 3 (Week 1): Pt will utlize swallowing compensatory strategies with supervision verbal cues.  SLP Short Term Goal 3 - Progress (Week 1): Progressing toward goal  Skilled Therapeutic Interventions: Group therapy sesssion to focus on swallowing compensatory strategies. pt needed min A due to needing verbal cues for sip size/single sip with straw and to throat clear x1 due to gurlgy voice.   No other overt s/s of aspiration noted.    FIM:  Comprehension Comprehension Mode: Auditory Comprehension: 5-Understands complex 90% of the time/Cues < 10% of the time Expression Expression Mode: Verbal Expression: 5-Expresses complex 90% of the time/cues < 10% of the time Social Interaction Social Interaction: 6-Interacts appropriately with others with medication or extra time (anti-anxiety, antidepressant). FIM - Eating Eating Activity: 5: Supervision/cues  Pain Pain Assessment Pain Assessment: No/denies pain  Therapy/Group: Group Therapy  Waldo Laine 02/02/2012, 4:11 PM

## 2012-02-03 LAB — GLUCOSE, CAPILLARY: Glucose-Capillary: 164 mg/dL — ABNORMAL HIGH (ref 70–99)

## 2012-02-03 MED ORDER — LORATADINE 10 MG PO TABS
10.0000 mg | ORAL_TABLET | Freq: Every day | ORAL | Status: DC
  Administered 2012-02-03 – 2012-02-14 (×12): 10 mg via ORAL
  Filled 2012-02-03 (×14): qty 1

## 2012-02-03 NOTE — Progress Notes (Signed)
Refused bath at HS. No void greater than 8 hours. Bladder scan =511.  I & O cath=415ml. Patient reported no urge to void. Will continue to monitor.Evan [name]

## 2012-02-03 NOTE — Progress Notes (Signed)
Physical Therapy Note  Patient Details  Name: Evan [name] MRN: [id number] Date of Birth: 15-Aug-1931 Today's Date: 02/03/2012  1000-1055 (55 minutes) individual Pain: no complaint of pain Focus of treatment: Bed mobility training; bilateral AROM/strengthening/ RT LE neuro re-ed; sit to stand training; standing alignment/ balance Treatment : Sit to stand from wc min/mod assist with vcs to increase forward trunk flexion/ transfer with RW min assist with increased time; sit to supine mod assist trunk/bilateral LEs; rolling SBA mat with vcs for technique; passive stretches hip adductors, ankle DF and hamstrings bilaterally; hip flexion-extension and trunk rotation using therapy ball in supine; side to sit mod assist ; side to sit using wedge (45 degree) to increase UE assist/strength min assist + vcs for hand placement; sit to stand from raised mat X 5 with tactile cues and min/mod assist with posterior loss of balance in erect standing; Pt forward flexes trunk to maintain standing balance and prevent posterior loss of balance.   Naven Giambalvo,JIM 02/03/2012, 10:50 AM

## 2012-02-03 NOTE — Progress Notes (Signed)
Occupational Therapy Session Note  Patient Details  Name: Evan [name] MRN: [id number] Date of Birth: [birth year]  Today's Date: 02/03/2012 Time:  - 0735-0820 and 1245-1345= 110 Time Calculation    Skilled Therapeutic Interventions/Progress Updates:   AM session:  ADL in w/c at sink with focus on transfers (mod to max A initially) and sit to stand (minA after "moving around a bit."   Motorically slow for completing tasks -appeared to understand the steps involved and completed them all but moved very, very slowly through the completion of tasks    PM session:  Nu step, transfers, right UE and LE weight bearing and endurance activities.  Patient would benefit from more sit to stand, postural control, transfers and endurance activities.  Therapy Documentation Precautions: Fall  Pain:denied   See FIM for current functional status  Therapy/Group: Individual Therapy  Bud Face [facility] 02/03/2012, 9:37 AM

## 2012-02-03 NOTE — Progress Notes (Signed)
Incontinent in addition to unmeasured void, PRV 114

## 2012-02-03 NOTE — Progress Notes (Signed)
Patient ID: Evan [name], male   DOB: [birth year], 78 y.o.   MRN: [id number] Taking 75-100% meals Subjective/Complaints: No new complaints.  CBGs were never above 120 on glucotrol at home Urinary retention requiring I/O cath Other ros items negative  Objective: Vital Signs: Blood pressure 163/78, pulse 61, temperature 97.9 F (36.6 C), temperature source Oral, resp. rate 17, height 5' 10.87" (1.8 m), weight 78.835 kg (173 lb 12.8 oz), SpO2 100.00%. No results found. No results found for this basename: WBC:2,HGB:2,HCT:2,PLT:2 in the last 72 hours No results found for this basename: NA:2,K:2,CL:2,CO2:2,GLUCOSE:2,BUN:2,CREATININE:2,CALCIUM:2 in the last 72 hours CBG (last 3)   Basename 02/03/12 0728 02/02/12 2031 02/02/12 1648  GLUCAP 144* 147* 131*    Wt Readings from Last 3 Encounters:  01/30/12 78.835 kg (173 lb 12.8 oz)  01/24/12 76 kg (167 lb 8.8 oz)  01/08/12 78.926 kg (174 lb)    Physical Exam:  General appearance: alert, cooperative and no distress Head: Normocephalic, without obvious abnormality, atraumatic Eyes: conjunctivae/corneas clear. PERRL, EOM's intact.  Ears: ext nl Nose: Nares normal. Septum midline. Mucosa normal. No drainage or sinus tenderness. Throat: lips, mucosa, and tongue normal; teeth and gums normal Neck: no adenopathy, no carotid bruit, no JVD, supple, symmetrical, trachea midline and thyroid not enlarged, symmetric, no tenderness/mass/nodules Back: symmetric, no curvature. ROM normal. No CVA tenderness. Resp: clear to auscultation bilaterally wheezes in left lung Cardio: regular rate and rhythm, S1, S2 normal, no murmur, click, rub or gallop GI: soft, non-tender; bowel sounds normal; no masses,  no organomegaly Extremities: extremities normal, atraumatic, no cyanosis or edema Pulses: 2+ and symmetric Skin: Skin color, texture, turgor normal. No rashes or lesions Neurologic: right side 3+ to 4 upper and 2-3 lower. Left lean. Right central 7 and  tongue deviation, speech slightly dysarthric. No hiccups Incision/Wound:  none   Assessment/Plan: 1. Functional deficits secondary to left pontine infarct which require 3+ hours per day of interdisciplinary therapy in a comprehensive inpatient rehab setting. Physiatrist is providing close team supervision and 24 hour management of active medical problems listed below. Physiatrist and rehab team continue to assess barriers to discharge/monitor patient progress toward functional and medical goals. FIM: FIM - Bathing Bathing Steps Patient Completed: Chest;Right Arm;Left Arm;Abdomen;Right upper leg;Left upper leg Bathing: 3: Mod-Patient completes 5-7 53f 10 parts or 50-74%  FIM - Upper Body Dressing/Undressing Upper body dressing/undressing steps patient completed: Thread/unthread left sleeve of pullover shirt/dress;Put head through opening of pull over shirt/dress Upper body dressing/undressing: 3: Mod-Patient completed 50-74% of tasks FIM - Lower Body Dressing/Undressing Lower body dressing/undressing steps patient completed: Thread/unthread left underwear leg;Don/Doff left sock;Don/Doff left shoe;Don/Doff right shoe Lower body dressing/undressing: 2: Max-Patient completed 25-49% of tasks  FIM - Toileting Toileting steps completed by patient: Adjust clothing prior to toileting;Performs perineal hygiene;Adjust clothing after toileting Toileting Assistive Devices: Grab bar or rail for support Toileting: 0: Activity did not occur  FIM - Diplomatic Services operational officer Devices: Building control surveyor Transfers: 0-Activity did not occur  FIM - Banker Devices: Bed rails Bed/Chair Transfer: 3: Supine > Sit: Mod A (lifting assist/Pt. 50-74%/lift 2 legs;2: Bed > Chair or W/C: Max A (lift and lower assist)  FIM - Locomotion: Wheelchair Distance: 15 Locomotion: Wheelchair: 1: Travels less than 50 ft with minimal assistance  (Pt.>75%) FIM - Locomotion: Ambulation Locomotion: Ambulation Assistive Devices: Designer, industrial/product Ambulation/Gait Assistance: 4: Min assist Locomotion: Ambulation: 1: Travels less than 50 ft with moderate assistance (Pt: 50 - 74%)  Comprehension  Comprehension Mode: Auditory Comprehension: 5-Understands basic 90% of the time/requires cueing < 10% of the time (appeared to require xtra time to process spoken language)  Expression Expression Mode: Verbal Expression: 6-Expresses complex ideas: With extra time/assistive device  Social Interaction Social Interaction: 6-Interacts appropriately with others with medication or extra time (anti-anxiety, antidepressant).  Problem Solving Problem Solving: 2-Solves basic 25 - 49% of the time - needs direction more than half the time to initiate, plan or complete simple activities  Memory Memory: 6-More than reasonable amt of time  1. Left paracentral pontine infarct  2. DVT Prophylaxis/Anticoagulation: Subcutaneous Lovenox. Monitor platelet counts and any signs of bleeding  3. diabetes mellitus. Hemoglobin A1c 5.8. Presently with sliding scale insulin. Patient on Glucotrol 5 mg twice a day prior to admission. Will resume am dose CBGs improving 4. Neuropsych: This patient is capable of making decisions on his/her own behalf.  5. History of renal transplant March of 2012. Continue immunosuppressants medicines as per renal services  6. GERD. Protonix  7. History of prostate cancer. Continue Bactrim 3 times weekly for bladder prophylaxis. Hold for now  -bactrim held  -ecoli uti sens to levaquin but also keflex- will change given potential interactions.  -urinary retention intermittent I/O cath will need uro f/u as outpt 8. Hiccups: low dose baclofen tid. Patient is tolerating and hiccups have stopped.     LOS (Days) 6 A FACE TO FACE EVALUATION WAS PERFORMED  Cono Gebhard E 02/03/2012, 9:56 AM

## 2012-02-03 NOTE — Progress Notes (Signed)
Physical Therapy Note  Patient Details  Name: ISHAQ MAFFEI MRN: [id number] Date of Birth: [birth year] Today's Date: 02/03/2012  1415 -1445 (30 minutes) individual Pain: no complaint of pain Focus of treatment: Transfer training; therapeutic activities focused on sit to stand /standing allignment Treatment: Transfer scoot to squat/pivot wc>mat SBA with tactile cues for hand placement; sit to stand from raised mat mod assist improving to mod/min assist with visual cues for increased forward trunk lean (using bedside table for support and to increase forward lean); Standing- instability RT Knee with max assist to maintain standing without UE support and loss of balance posteriorly (pt attempts to correct with forward trunk flexion).   Johnica Armwood,JIM 02/03/2012, 3:00 PM

## 2012-02-04 LAB — GLUCOSE, CAPILLARY
Glucose-Capillary: 123 mg/dL — ABNORMAL HIGH (ref 70–99)
Glucose-Capillary: 153 mg/dL — ABNORMAL HIGH (ref 70–99)
Glucose-Capillary: 153 mg/dL — ABNORMAL HIGH (ref 70–99)

## 2012-02-04 NOTE — Progress Notes (Addendum)
Physical Therapy Session Note  Patient Details  Name: Evan [name] MRN: [id number] Date of Birth: [birth year]  Today's Date: 02/04/2012  Short Term Goals: Week 1:  PT Short Term Goal 1 (Week 1): Pt will perform functional transfers with mod A PT Short Term Goal 2 (Week 1): Pt will demo dynamic standing balance with mod A for functional activity PT Short Term Goal 3 (Week 1): Pt will gait 50' in controlled environment with min A  Skilled Therapeutic Interventions/Progress Updates:  1st tx:  Time: 1035-1105 Time Calculation (min): 30 min Tx focused on transfer training for strength and safety as well as pre-gait and gait training with RW.  Repeated sit<>stand WC<>RW with Min A/Mod for steadying and lifting, blocking R knee, x8 with cues for anterior weight-shift and timing. In standing, performed marching x10 bil with cues for technique, and step-taps to 4" step bil x10 with assist to control R knee in stance. Cues for posture and use of UE for full trunk ext.  Gait 1x20' with RW and Min A for steadying. Cues for foot clearance as pt reached threshold and turns as his feet began to drag at this point.  Toilet transfer with Min A and cues for sequence.  No complaints of pain  2nd tx Time: 2:35-3:19 Time Calculation (min): 44 min Tx focused on LE strengthening, gait training with RW, and standing balance.  Pt propelled WC x40' with Min A for steering and cues for efficient stroke length. Distance limited by fatigue, very slow pace.  Nustep x12:30 level 3 with bil LE and UE, goal of 50spm, which pt was able to achieve approx 50% of the time with cues and 3 rest breaks.  Sit<>stand transfers and stand-step transfers with min A for steadying.  Standing ball toss in/outside BOS in all directions x42min with cues for reaching and Min A for steadying to reduce posterior LOB.  Gait training 1x50' and 1x68' with RW and cues for increasing step width, foot clearance, and tighten R quad in stance. Pt  able to momentarrily adjust gait pattern with cues, but unable to maintain. Pt had no episodes of R knee instablity this afternoon.     See FIM for current functional status  Therapy/Group: Individual Therapy  Virl Cagey, PT 02/04/2012, 12:26 PM

## 2012-02-04 NOTE — Progress Notes (Signed)
Taking 75-100% meals Subjective/Complaints: No new complaints except for right knee giving out on occasion. Intermittently associated with pain. Urinary retention requiring I/O cath Other ros items negative  Objective: Vital Signs: Blood pressure 163/76, pulse 61, temperature 97.6 F (36.4 C), temperature source Oral, resp. rate 18, height 5' 10.87" (1.8 m), weight 78.835 kg (173 lb 12.8 oz), SpO2 100.00%. No results found. No results found for this basename: WBC:2,HGB:2,HCT:2,PLT:2 in the last 72 hours No results found for this basename: NA:2,K:2,CL:2,CO2:2,GLUCOSE:2,BUN:2,CREATININE:2,CALCIUM:2 in the last 72 hours CBG (last 3)   Basename 02/04/12 0706 02/03/12 2022 02/03/12 1644  GLUCAP 123* 164* 147*    Wt Readings from Last 3 Encounters:  01/30/12 78.835 kg (173 lb 12.8 oz)  01/24/12 76 kg (167 lb 8.8 oz)  01/08/12 78.926 kg (174 lb)    Physical Exam:  General appearance: alert, cooperative and no distress Head: Normocephalic, without obvious abnormality, atraumatic Eyes: conjunctivae/corneas clear. PERRL, EOM's intact.  Ears: ext nl Nose: Nares normal. Septum midline. Mucosa normal. No drainage or sinus tenderness. Throat: lips, mucosa, and tongue normal; teeth and gums normal Neck: no adenopathy, no carotid bruit, no JVD, supple, symmetrical, trachea midline and thyroid not enlarged, symmetric, no tenderness/mass/nodules Back: symmetric, no curvature. ROM normal. No CVA tenderness. Resp: clear to auscultation bilaterally wheezes in left lung Cardio: regular rate and rhythm, S1, S2 normal, no murmur, click, rub or gallop GI: soft, non-tender; bowel sounds normal; no masses,  no organomegaly Extremities: extremities normal, atraumatic, no cyanosis or edema Pulses: 2+ and symmetric Skin: Skin color, texture, turgor normal. No rashes or lesions Neurologic: right side 3+ to 4 upper and 2-3 lower. Left lean. Right central 7 and tongue deviation, speech slightly dysarthric. No  hiccups YQ:MVHQI knee with mild crepitus, minimal joint line pain, no effusion.   Assessment/Plan: 1. Functional deficits secondary to left pontine infarct which require 3+ hours per day of interdisciplinary therapy in a comprehensive inpatient rehab setting. Physiatrist is providing close team supervision and 24 hour management of active medical problems listed below. Physiatrist and rehab team continue to assess barriers to discharge/monitor patient progress toward functional and medical goals. FIM: FIM - Bathing Bathing Steps Patient Completed: Chest;Right Arm;Left Arm;Abdomen;Right upper leg;Left upper leg Bathing: 3: Mod-Patient completes 5-7 17f 10 parts or 50-74%  FIM - Upper Body Dressing/Undressing Upper body dressing/undressing steps patient completed: Thread/unthread left sleeve of pullover shirt/dress;Put head through opening of pull over shirt/dress Upper body dressing/undressing: 3: Mod-Patient completed 50-74% of tasks FIM - Lower Body Dressing/Undressing Lower body dressing/undressing steps patient completed: Thread/unthread left underwear leg;Don/Doff left sock;Don/Doff left shoe;Don/Doff right shoe Lower body dressing/undressing: 2: Max-Patient completed 25-49% of tasks  FIM - Toileting Toileting steps completed by patient: Adjust clothing prior to toileting;Performs perineal hygiene;Adjust clothing after toileting Toileting Assistive Devices: Grab bar or rail for support Toileting: 1: Total-Patient completed zero steps, helper did all 3  FIM - Diplomatic Services operational officer Devices: Building control surveyor Transfers: 0-Activity did not occur  FIM - Banker Devices: Bed rails Bed/Chair Transfer: 3: Supine > Sit: Mod A (lifting assist/Pt. 50-74%/lift 2 legs;2: Bed > Chair or W/C: Max A (lift and lower assist)  FIM - Locomotion: Wheelchair Distance: 15 Locomotion: Wheelchair: 1: Travels less than 50 ft with  minimal assistance (Pt.>75%) FIM - Locomotion: Ambulation Locomotion: Ambulation Assistive Devices: Designer, industrial/product Ambulation/Gait Assistance: 4: Min assist Locomotion: Ambulation: 1: Travels less than 50 ft with moderate assistance (Pt: 50 - 74%)  Comprehension Comprehension Mode:  Auditory Comprehension: 5-Understands complex 90% of the time/Cues < 10% of the time  Expression Expression Mode: Verbal Expression: 5-Expresses complex 90% of the time/cues < 10% of the time  Social Interaction Social Interaction: 6-Interacts appropriately with others with medication or extra time (anti-anxiety, antidepressant).  Problem Solving Problem Solving: 5-Solves basic 90% of the time/requires cueing < 10% of the time  Memory Memory: 5-Recognizes or recalls 90% of the time/requires cueing < 10% of the time  1. Left paracentral pontine infarct  2. DVT Prophylaxis/Anticoagulation: Subcutaneous Lovenox. Monitor platelet counts and any signs of bleeding  3. diabetes mellitus. Hemoglobin A1c 5.8. Presently with sliding scale insulin. Patient on Glucotrol 5 mg twice a day prior to admission. Glucotrol resumed at 5mg  qam. 4. Neuropsych: This patient is capable of making decisions on his/her own behalf.  5. History of renal transplant March of 2012. Continue immunosuppressants medicines as per renal services  6. GERD. Protonix  7. History of prostate cancer. PTA Bactrim 3 times weekly for bladder prophylaxis.  -bactrim held  -ecoli uti- keflex  -urinary retention seems to have improved with rx of UTI 8. Hiccups: low dose baclofen tid. Patient is tolerating and hiccups have stopped. 9. Right knee instability: mild OA likely with associated muscle weakness  -consider knee sleeve     LOS (Days) 7 A FACE TO FACE EVALUATION WAS PERFORMED  Loyd Marhefka T 02/04/2012, 8:36 AM

## 2012-02-04 NOTE — Progress Notes (Addendum)
Occupational Therapy Session Note  Patient Details  Name: Evan [name] MRN: [id number] Date of Birth: 27-Nov-1931  Today's Date: 02/04/2012 Time: 4098-1191 Time Calculation (min): 60 min  Short Term Goals: Week 1:  OT Short Term Goal 1 (Week 1): Pt will transfer to [facility] with mod A consistantly with RW OT Short Term Goal 2 (Week 1): Pt will perform toileting with max A OT Short Term Goal 3 (Week 1): Pt will thread pants/ underwear with mod A OT Short Term Goal 4 (Week 1): Pt will stand with mod to perform one grooming task  Skilled Therapeutic Interventions/Progress Updates:    Pt on toilet at beginning of session.  Pt performed sit<>stand with steady assist but required total A for toileting.  Pt transitioned to room to complete dressing tasks with sit<>stand from w/c to pull up pants.  Pt uses reacher to assist with threading pants but requires assistance pulling up pants and donning shoes.  Pt requires extra time to complete tasks.  Pt completed grooming tasks seated at sink.  Focus on activity tolerance, dynamic standing balance, and safety awareness.  Therapy Documentation Precautions:  Precautions Precautions: Fall Restrictions Weight Bearing Restrictions: No  Pt denies pain   See FIM for current functional status  Therapy/Group: Individual Therapy  Rich Brave 02/04/2012, 3:11 PM

## 2012-02-04 NOTE — Progress Notes (Signed)
Left arm with AVG, positive bruit and thrill. Ate  Snack. At 0550 patient incontinent of small amount of urine, PVR=682, I & O cath = 500.Evan [name]

## 2012-02-04 NOTE — Progress Notes (Signed)
Speech Language Pathology Daily Session Note [employer]  Patient Details  Name: Evan [name] MRN: [id number] Date of Birth: 30-Oct-1931  Today's Date: 02/04/2012 Time: 1130-1225 Time Calculation (min): 55 min  Short Term Goals: Week 1: SLP Short Term Goal 1 (Week 1): Pt will utlize speech intelligibility strategies at the conversation level with supervision verbal and questioning cues.  SLP Short Term Goal 2 (Week 1): Pt will consume regular textures and thin liquids without overt s/s of aspiration with Mod I.  SLP Short Term Goal 2 - Progress (Week 1): Progressing toward goal SLP Short Term Goal 3 (Week 1): Pt will utlize swallowing compensatory strategies with supervision verbal cues.  SLP Short Term Goal 3 - Progress (Week 1): Progressing toward goal  Skilled Therapeutic Interventions: Pt seen in [employer] with focus on utilization of swallowing compensatory strategies of small bites and sips and multiple swallows. Pt required Min A questioning and verbal cues to utilize and Mod A questioning cues to self-monitor and correct wet vocal quality.  Pt with intermittent wet vocal quality and coughing X 1, suspect due to decrease utilization of strategies and overall fatigue with decreased management of secretions.    FIM:  Comprehension Comprehension Mode: Auditory Comprehension: 5-Understands complex 90% of the time/Cues < 10% of the time Expression Expression: 4-Expresses basic 75 - 89% of the time/requires cueing 10 - 24% of the time. Needs helper to occlude trach/needs to repeat words. Social Interaction Social Interaction: 6-Interacts appropriately with others with medication or extra time (anti-anxiety, antidepressant). Problem Solving Problem Solving: 5-Solves basic problems: With no assist Memory Memory: 5-Recognizes or recalls 90% of the time/requires cueing < 10% of the time FIM - Eating Eating Activity: 5: Supervision/cues  Pain Pain Assessment Pain Assessment:  No/denies pain  Therapy/Group: Individual Therapy  Christell Steinmiller 02/04/2012, 3:45 PM

## 2012-02-04 NOTE — Plan of Care (Signed)
Problem: RH SKIN INTEGRITY Goal: RH STG SKIN FREE OF INFECTION/BREAKDOWN Skin Free of breakdown with min assist  Outcome: Progressing Turn patient every 2 hours  Problem: RH PAIN MANAGEMENT Goal: RH STG PAIN MANAGED AT OR BELOW PT'S PAIN GOAL Pt will rate pain <3 on scale of 0-10 with min assist  Outcome: Progressing No complaints of pain  Problem: RH KNOWLEDGE DEFICIT Goal: RH STG INCREASE KNOWLEDGE OF HYPERTENSION With min assist  Outcome: Progressing Able to verbalize medications

## 2012-02-04 NOTE — Progress Notes (Signed)
Speech Language Pathology Daily Session Note  Patient Details  Name: Evan [name] MRN: [id number] Date of Birth: 02/11/1932  Today's Date: 02/04/2012 Time: 1000-1030 Time Calculation (min): 30 min  Short Term Goals: Week 1: SLP Short Term Goal 1 (Week 1): Pt will utlize speech intelligibility strategies at the conversation level with supervision verbal and questioning cues.  SLP Short Term Goal 2 (Week 1): Pt will consume regular textures and thin liquids without overt s/s of aspiration with Mod I.  SLP Short Term Goal 2 - Progress (Week 1): Progressing toward goal SLP Short Term Goal 3 (Week 1): Pt will utlize swallowing compensatory strategies with supervision verbal cues.  SLP Short Term Goal 3 - Progress (Week 1): Progressing toward goal  Skilled Therapeutic Interventions: Treatment focus on speech intelligibility at the conversation level. Pt required Min A verbal and questioning cues to utilize a loud vocal intensity and for over-articulation. Pt was ~80% intelligible. Pt also given handout and educated on oral motor exercises to perform independently to increase lingual and labial ROM and strength. Pt verbalized and demonstrated understanding.    FIM:  Comprehension Comprehension Mode: Auditory Comprehension: 5-Understands complex 90% of the time/Cues < 10% of the time Expression Expression: 4-Expresses basic 75 - 89% of the time/requires cueing 10 - 24% of the time. Needs helper to occlude trach/needs to repeat words. Social Interaction Social Interaction: 6-Interacts appropriately with others with medication or extra time (anti-anxiety, antidepressant). Problem Solving Problem Solving: 5-Solves basic problems: With no assist Memory Memory: 5-Recognizes or recalls 90% of the time/requires cueing < 10% of the time FIM - Eating Eating Activity: 5: Supervision/cues  Pain Pain Assessment Pain Assessment: No/denies pain  Therapy/Group: Individual Therapy  Cebastian Neis,  [name] 02/04/2012, 3:41 PM

## 2012-02-05 LAB — GLUCOSE, CAPILLARY
Glucose-Capillary: 179 mg/dL — ABNORMAL HIGH (ref 70–99)
Glucose-Capillary: 144 mg/dL — ABNORMAL HIGH (ref 70–99)

## 2012-02-05 MED ORDER — GLUCERNA SHAKE PO LIQD
237.0000 mL | ORAL | Status: DC
  Administered 2012-02-06 – 2012-02-13 (×6): 237 mL via ORAL

## 2012-02-05 NOTE — Progress Notes (Signed)
Occupational Therapy Note  Patient Details  Name: Evan [name] MRN: [id number] Date of Birth: Dec 16, 1931 Today's Date: 02/05/2012  12:00-12:15;  15 Minutes  [employer] [facility] with focus on completing self feeding tasks in a timely manner secondary patient is very slow with eating, increased use of dominant RUE with all one handed and bilateral tasks.  No c/o pain yet spoke of being tired due to working so hard in morning therapy.  [name] 02/05/2012, 1:52 PM

## 2012-02-05 NOTE — Progress Notes (Signed)
Occupational Therapy Session Note  Patient Details  Name: Evan [name] MRN: [id number] Date of Birth: 1932-08-01  Today's Date: 02/05/2012  Session 1 Time: 0900-1015 Time Calculation (min):  Short Term Goals: Week 1:  OT Short Term Goal 1 (Week 1): Pt will transfer to [facility] with mod A consistantly with RW OT Short Term Goal 2 (Week 1): Pt will perform toileting with max A OT Short Term Goal 3 (Week 1): Pt will thread pants/ underwear with mod A OT Short Term Goal 4 (Week 1): Pt will stand with mod to perform one grooming task  Skilled Therapeutic Interventions/Progress Updates:    Pt on toilet upon arrival.  Pt able to perform toilet hygiene with steady assist while standing but required assistance with pulling up pants.  Pt amb with RW to shower for bathing.  Pt completed bathing with sit to stand from tub bench.  Pt engaged in dressing tasks with sit to stand from w/c at sink.  Pt continues to requires steady assists/min A with sit to stand and steady A when standing.  Pt requires assistance with pulling up pants and donning shoes.  Pt exhibits decreased balance reactions and unable to correct balance with LOB.  Pt requires increased time to complete all tasks and fatigues during session.  Focus on activity tolerance, standing balance, and safety awareness.  Therapy Documentation Precautions:  Precautions Precautions: Fall Restrictions Weight Bearing Restrictions: No  See FIM for current functional status Pt denies pain Therapy/Group: Individual Therapy  Session 2 Time: 1300-1330 Pt denies pain Individual Therapy  Pt stated that he was "very tired" after AM therapies.  Pt engaged in BUE exercises with 2# and 3# weights.  Pt required multiple rest breaks throughout session.  Focus on BUE strength and activity tolerance to increase independence with BADLs.  Lavone Neri [facility] 02/05/2012, 3:17 PM

## 2012-02-05 NOTE — Progress Notes (Signed)
Speech Language Pathology Weekly Progress & [employer] Note  Patient Details  Name: Evan [name] MRN: [id number] Date of Birth: 04-20-1932  Today's Date: 02/05/2012 Time: 1130-1200 Time Calculation (min): 30 min  Short Term Goals: Week 1: SLP Short Term Goal 1 (Week 1): Pt will utlize speech intelligibility strategies at the conversation level with supervision verbal and questioning cues.  SLP Short Term Goal 1 - Progress (Week 1): Not met SLP Short Term Goal 2 (Week 1): Pt will consume regular textures and thin liquids without overt s/s of aspiration with Mod I.  SLP Short Term Goal 2 - Progress (Week 1): Discontinued (comment) SLP Short Term Goal 3 (Week 1): Pt will utlize swallowing compensatory strategies with supervision verbal cues.  SLP Short Term Goal 3 - Progress (Week 1): Met  New Short Term Goals:  Week 2: SLP Short Term Goal 1 (Week 2): Pt will utilize speech intelligibility strategies at the conversation level with supervision verbal cues and question cues.  SLP Short Term Goal 2 (Week 2): Pt will consume Dys. 3 textures and thin liquids without overt s/s of aspiration.  SLP Short Term Goal 3 (Week 2): Pt will utilize swallowing compensatory strategies with mod I.   Weekly Progress Updates: Pt has made functional progress and has met 1 out of 2 STGs this reporting period. Currently, pt is consuming Dys. 3 textures and thin liquids with intermittent overt s/s of aspiration due to decreased utilization of swallowing compensatory strategies. Pt requires intermittent supervision cues to utilize multiple swallows and small, single sips via straw. Pt also requires Min A cueing to utilize speech intelligibility at the conversation level.  Pt would benefit from continued skilled SLP intervention to maximize swallowing safety with utilization of compensatory strategies and to maximize overall speech intelligibility at the conversation level.    SLP Frequency: 1-2 X/day, 30-60  minutes Estimated Length of Stay: 02/19/12 SLP Treatment/Interventions: Dysphagia/aspiration precaution training;Speech/Language facilitation;Therapeutic Activities;Patient/family education;Functional tasks;Cueing hierarchy;Internal/external aids;Therapeutic Exercise  Daily Session Skilled Therapeutic Intervention: Pt seen in [employer] with focus on utilization of swallowing compensatory strategies and speech intelligibility strategies. Pt required intermittent supervision verbal cues to utilize small, single sips via straw, however, he did not demonstrate overt s/s of aspiration. Pt also required Min A verbal cues to utilize increased vocal intensity and over articulation to increase speech intelligbility at the conversation level. Pt's overall fatigue impacted intelligibility.  FIM:  Comprehension Comprehension Mode: Auditory Comprehension: 5-Understands complex 90% of the time/Cues < 10% of the time Expression Expression Mode: Verbal Expression: 4-Expresses basic 75 - 89% of the time/requires cueing 10 - 24% of the time. Needs helper to occlude trach/needs to repeat words. Social Interaction Social Interaction: 5-Interacts appropriately 90% of the time - Needs monitoring or encouragement for participation or interaction. Problem Solving Problem Solving: 5-Solves basic problems: With no assist Memory Memory: 5-Recognizes or recalls 90% of the time/requires cueing < 10% of the time FIM - Eating Eating Activity: 5: Supervision/cues Pain Pain Assessment Pain Assessment: No/denies pain  Therapy/Group: Group Therapy  Evan [name] 02/05/2012, 3:42 PM

## 2012-02-05 NOTE — Progress Notes (Signed)
Patient ID: Evan [name], male   DOB: 1931-09-12, 78 y.o.   MRN: [id number] Taking 75-100% meals Subjective/Complaints: No new complaints except wants to upgrade his diet. Urinary retention requiring I/O cath Other ros items negative  Objective: Vital Signs: Blood pressure 156/72, pulse 66, temperature 98.3 F (36.8 C), temperature source Oral, resp. rate 18, height 5' 10.87" (1.8 m), weight 78.835 kg (173 lb 12.8 oz), SpO2 99.00%. No results found. No results found for this basename: WBC:2,HGB:2,HCT:2,PLT:2 in the last 72 hours No results found for this basename: NA:2,K:2,CL:2,CO2:2,GLUCOSE:2,BUN:2,CREATININE:2,CALCIUM:2 in the last 72 hours CBG (last 3)   Basename 02/05/12 0705 02/04/12 2210 02/04/12 1627  GLUCAP 130* 153* 103*    Wt Readings from Last 3 Encounters:  01/30/12 78.835 kg (173 lb 12.8 oz)  01/24/12 76 kg (167 lb 8.8 oz)  01/08/12 78.926 kg (174 lb)    Physical Exam:  General appearance: alert, cooperative and no distress Head: Normocephalic, without obvious abnormality, atraumatic Eyes: conjunctivae/corneas clear. PERRL, EOM's intact.  Ears: ext nl Nose: Nares normal. Septum midline. Mucosa normal. No drainage or sinus tenderness. Throat: lips, mucosa, and tongue normal; teeth and gums normal Neck: no adenopathy, no carotid bruit, no JVD, supple, symmetrical, trachea midline and thyroid not enlarged, symmetric, no tenderness/mass/nodules Back: symmetric, no curvature. ROM normal. No CVA tenderness. Resp: clear to auscultation bilaterally wheezes in left lung Cardio: regular rate and rhythm, S1, S2 normal, no murmur, click, rub or gallop GI: soft, non-tender; bowel sounds normal; no masses,  no organomegaly Extremities: extremities normal, atraumatic, no cyanosis or edema Pulses: 2+ and symmetric Skin: Skin color, texture, turgor normal. No rashes or lesions Neurologic: right side 3+ to 4 upper and 2-3 lower. Left lean is imporoving with better truncal control.  Right central 7 and tongue deviation, speech slightly dysarthric. No hiccups WU:XLKGM knee with mild crepitus, minimal joint line pain, no effusion.   Assessment/Plan: 1. Functional deficits secondary to left pontine infarct which require 3+ hours per day of interdisciplinary therapy in a comprehensive inpatient rehab setting. Physiatrist is providing close team supervision and 24 hour management of active medical problems listed below. Physiatrist and rehab team continue to assess barriers to discharge/monitor patient progress toward functional and medical goals. FIM: FIM - Bathing Bathing Steps Patient Completed: Chest;Right Arm;Left Arm;Abdomen;Right upper leg;Left upper leg Bathing: 3: Mod-Patient completes 5-7 39f 10 parts or 50-74%  FIM - Upper Body Dressing/Undressing Upper body dressing/undressing steps patient completed: Thread/unthread right sleeve of pullover shirt/dresss;Thread/unthread left sleeve of pullover shirt/dress;Put head through opening of pull over shirt/dress;Pull shirt over trunk Upper body dressing/undressing: 5: Supervision: Safety issues/verbal cues FIM - Lower Body Dressing/Undressing Lower body dressing/undressing steps patient completed: Thread/unthread right pants leg;Thread/unthread left pants leg;Fasten/unfasten right shoe Lower body dressing/undressing: 2: Max-Patient completed 25-49% of tasks  FIM - Toileting Toileting steps completed by patient: Adjust clothing prior to toileting Toileting Assistive Devices: Grab bar or rail for support Toileting: 1: Total-Patient completed zero steps, helper did all 3  FIM - Diplomatic Services operational officer Devices: Elevated toilet seat;Grab bars;Walker Toilet Transfers: 4-To toilet/BSC: Min A (steadying Pt. > 75%)  FIM - Banker Devices: Walker;Arm rests Bed/Chair Transfer: 4: Bed > Chair or W/C: Min A (steadying Pt. > 75%);4: Chair or W/C > Bed: Min A (steadying  Pt. > 75%)  FIM - Locomotion: Wheelchair Distance: 40 Locomotion: Wheelchair: 1: Travels less than 50 ft with minimal assistance (Pt.>75%) FIM - Locomotion: Ambulation Locomotion: Ambulation Assistive Devices: Designer, industrial/product  Ambulation/Gait Assistance: 4: Min assist Locomotion: Ambulation: 2: Travels 50 - 149 ft with minimal assistance (Pt.>75%)  Comprehension Comprehension Mode: Auditory Comprehension: 5-Understands complex 90% of the time/Cues < 10% of the time  Expression Expression Mode: Verbal Expression: 4-Expresses basic 75 - 89% of the time/requires cueing 10 - 24% of the time. Needs helper to occlude trach/needs to repeat words.  Social Interaction Social Interaction: 6-Interacts appropriately with others with medication or extra time (anti-anxiety, antidepressant).  Problem Solving Problem Solving: 5-Solves basic problems: With no assist  Memory Memory: 5-Recognizes or recalls 90% of the time/requires cueing < 10% of the time  1. Left paracentral pontine infarct  2. DVT Prophylaxis/Anticoagulation: Subcutaneous Lovenox. Monitor platelet counts and any signs of bleeding  3. diabetes mellitus. Hemoglobin A1c 5.8. Presently with sliding scale insulin. Patient on Glucotrol 5 mg twice a day prior to admission. Glucotrol resumed at 5mg  qam. 4. Neuropsych: This patient is capable of making decisions on his/her own behalf.  5. History of renal transplant March of 2012. Continue immunosuppressants medicines as per renal services  6. GERD. Protonix  7. History of prostate cancer. PTA Bactrim 3 times weekly for bladder prophylaxis.  -bactrim held  -ecoli uti- keflex  -urinary retention seems to have improved with rx of UTI 8. Hiccups: low dose baclofen tid. Patient is tolerating and hiccups have stopped. 9. Right knee instability: mild OA likely with associated muscle weakness  -consider knee sleeve if persistent- i will discuss with therapy     LOS (Days) 8 A FACE TO  FACE EVALUATION WAS PERFORMED  Madeline Bebout T 02/05/2012, 9:04 AM

## 2012-02-05 NOTE — Progress Notes (Signed)
Physical Therapy Weekly Progress Note  Patient Details  Name: Evan [name] MRN: [id number] Date of Birth: 1932-06-21  Today's Date: 02/05/2012  Patient has met 2 of 3 short term goals.  He has improved strength and activity tolerance to require only min-mod A with functional transfers and ambulation.  Pt still demo's significant weakness in trunk and LEs and decreased activity tolerance.  Pt with weak R knee which frequently buckles (happening  PTA) which causes him to require occasional mod A due to decreased/delayed balanced reactions.  Patient continues to demonstrate the following deficits: decreased strength, decreased activity tolerance, impaired balance, decreased gait and therefore will continue to benefit from skilled PT intervention to enhance overall performance with activity tolerance, balance and ability to compensate for deficits.  Patient not progressing toward long term goals.  See goal revision..  Continue plan of care.  PT Short Term Goals Week 1:  PT Short Term Goal 1 (Week 1): Pt will perform functional transfers with mod A PT Short Term Goal 1 - Progress (Week 1): Met PT Short Term Goal 2 (Week 1): Pt will demo dynamic standing balance with mod A for functional activity PT Short Term Goal 2 - Progress (Week 1): Met PT Short Term Goal 3 (Week 1): Pt will gait 50' in controlled environment with min A PT Short Term Goal 3 - Progress (Week 1): Progressing toward goal Week 2:  PT Short Term Goal 1 (Week 2): Pt will gait 50' with min A in controlled environment PT Short Term Goal 2 (Week 2): Pt will consistently perform sit to stand transfers with min A PT Short Term Goal 3 (Week 2): Pt will demo standing dynamic balance for functional task with min A for 2 minutes  Skilled Therapeutic Interventions/Progress Updates:  Ambulation/gait training;Balance/vestibular training;Discharge planning;Functional mobility training;Therapeutic Activities;Patient/family education;Stair  training;Pain management;DME/adaptive equipment instruction;Neuromuscular re-education;Splinting/orthotics;Therapeutic Exercise;UE/LE Strength taining/ROM;UE/LE Coordination activities;Wheelchair propulsion/positioning      See FIM for current functional status    [name] 02/05/2012, 4:11 PM

## 2012-02-05 NOTE — Progress Notes (Signed)
Physical Therapy Note  Patient Details  Name: BANNER HUCKABA MRN: [id number] Date of Birth: Nov 15, 1931 Today's Date: 02/05/2012  Time: 1015-1110 55 minutes  No c/o pain.  Standing pre gait training with focus on hip and knee flexion for foot clearance with tap ups, step ups B LEs with min A, frequent R knee buckling during this activity.  Gait 40' x 2 with cues to increase stride length and decrease shuffling, pt fatigues easily requiring occasional mod A for balance correction due to R knee buckling.  Standing bend and reach activity for balance and trunk/LE ROM, pt fatigued easily but was able to perform with min A.  Seated ball toss for trunk stability and endurance with supervision, frequent rests due to UE fatigue.  Individual therapy    Rhilyn Battle 02/05/2012, 12:15 PM

## 2012-02-05 NOTE — Progress Notes (Signed)
INITIAL ADULT NUTRITION ASSESSMENT Date: 02/05/2012   Time: 2:50 PM  Reason for Assessment: Health History  ASSESSMENT: Male 78 y.o.  Dx: CVA (cerebral infarction)  Hx:  Past Medical History  Diagnosis Date  . H/O kidney transplant   . Diabetes mellitus   . Cancer     hx prostate cancer  . ESRD (end stage renal disease) 2011    hemodialysis started Sept 2011, cadaveric (double) renal  transplant Mar 2012 at [facility]   Past Surgical History  Procedure Date  . Kidney transplant   . Prostate surgery approx. 2003    seed implant   Related Meds:     . baclofen  5 mg Oral TID  . cephALEXin  250 mg Oral Q8H  . clopidogrel  75 mg Oral Q breakfast  . enoxaparin  40 mg Subcutaneous Q24H  . insulin aspart  0-15 Units Subcutaneous TID WC  . loratadine  10 mg Oral Daily  . mycophenolate  180 mg Oral BID  . omeprazole  40 mg Oral BID  . polyethylene glycol  17 g Oral Daily  . tacrolimus  5 mg Oral BID   Ht: 5' 10.87" (180 cm) (from 01/24/12 at [facility])  Wt: 173 lb 12.8 oz (78.835 kg)  Ideal Wt: 78.2 kg % Ideal Wt: 101%  Wt Readings from Last 15 Encounters:  01/30/12 173 lb 12.8 oz (78.835 kg)  01/24/12 167 lb 8.8 oz (76 kg)  01/08/12 174 lb (78.926 kg)  01/01/12 174 lb (78.926 kg)  Usual Wt: 175 - 180 lb (per family) % Usual Wt: 99%  Body mass index is 24.33 kg/(m^2). Weight is WNL  Food/Nutrition Related Hx: pt with variable appetite PTA, per family; takes megace prn  Labs:  CMP     Component Value Date/Time   NA 134* 01/29/2012 0630   K 4.3 01/29/2012 0630   CL 106 01/29/2012 0630   CO2 19 01/29/2012 0630   GLUCOSE 93 01/29/2012 0630   BUN 17 01/29/2012 0630   CREATININE 1.18 01/29/2012 0630   CALCIUM 8.8 01/29/2012 0630   CALCIUM 9.5 12/25/2011 1005   PROT 6.1 01/29/2012 0630   ALBUMIN 3.3* 01/29/2012 0630   AST 29 01/29/2012 0630   ALT 38 01/29/2012 0630   ALKPHOS 45 01/29/2012 0630   BILITOT 0.4 01/29/2012 0630   GFRNONAA 58* 01/29/2012 0630   GFRAA 67* 01/29/2012 0630     Phosphorus  Date/Time Value Range Status  01/28/2012  4:21 AM 3.3  2.3 - 4.6 mg/dL Final   Lab Results  Component Value Date   HGBA1C 5.8* 01/25/2012     Intake/Output Summary (Last 24 hours) at 02/05/12 1513 Last data filed at 02/05/12 1400  Gross per 24 hour  Intake    480 ml  Output   1150 ml  Net   -670 ml     Diet Order: Dysphagia 3 with thin liquids  Supplements/Tube Feeding: none  IVF:    Estimated Nutritional Needs:   Kcal: 1700 - 1900 kcal Protein:  65 - 80 grams Fluid:  1.8 - 2 liters daily  Past medical hx of DM and ESRD. Kidney transplant in March 2012. Admitted to rehab s/p CVA.  No skin breakdown. BM on 7/2. Meal completion variable, but mostly 75 - 100% of meals. This RD attended [employer] today. Pt consumed 50% of meals and asking for Glucerna. Pt states that he enjoys these supplements and would like to consume them daily.  Family reports that pt consumes Megace  as needed at home. Will drink Glucerna at home.  NUTRITION DIAGNOSIS: -Increased nutrient needs (NI-5.1).  Status: Ongoing  RELATED TO: increased energy expenditure with participation of therapies  AS EVIDENCE BY: estimated needs  MONITORING/EVALUATION(Goals): Goal: Pt to meet >/= 90% of their estimated nutrition needs Monitor: weights, labs, PO intake  EDUCATION NEEDS: -No education needs identified at this time  INTERVENTION: 1. Glucerna Shake po daily, each supplement provides 220 kcal and 10 grams of protein. 2. RD to continue to follow nutrition care plan   DOCUMENTATION CODES Per approved criteria  -Not Applicable   Jarold Motto MS, RD, LDN Pager: [phone] After-hours pager: [phone]

## 2012-02-06 LAB — GLUCOSE, CAPILLARY
Glucose-Capillary: 126 mg/dL — ABNORMAL HIGH (ref 70–99)
Glucose-Capillary: 172 mg/dL — ABNORMAL HIGH (ref 70–99)

## 2012-02-06 MED ORDER — BACLOFEN 5 MG HALF TABLET
5.0000 mg | ORAL_TABLET | Freq: Three times a day (TID) | ORAL | Status: DC | PRN
  Administered 2012-02-07 – 2012-02-08 (×3): 5 mg via ORAL
  Filled 2012-02-06 (×3): qty 1

## 2012-02-06 NOTE — Progress Notes (Signed)
Taking 75-100% meals Subjective/Complaints: No new complaints  Urinary retention requiring I/O cath Other ros items negative  Objective: Vital Signs: Blood pressure 133/69, pulse 62, temperature 97.5 F (36.4 C), temperature source Oral, resp. rate 18, height 5' 10.87" (1.8 m), weight 78.835 kg (173 lb 12.8 oz), SpO2 99.00%. No results found. No results found for this basename: WBC:2,HGB:2,HCT:2,PLT:2 in the last 72 hours No results found for this basename: NA:2,K:2,CL:2,CO2:2,GLUCOSE:2,BUN:2,CREATININE:2,CALCIUM:2 in the last 72 hours CBG (last 3)   Basename 02/06/12 0727 02/05/12 2102 02/05/12 1607  GLUCAP 126* 144* 179*    Wt Readings from Last 3 Encounters:  01/30/12 78.835 kg (173 lb 12.8 oz)  01/24/12 76 kg (167 lb 8.8 oz)  01/08/12 78.926 kg (174 lb)    Physical Exam:  General appearance: alert, cooperative and no distress Head: Normocephalic, without obvious abnormality, atraumatic Eyes: conjunctivae/corneas clear. PERRL, EOM's intact.  Ears: ext nl Nose: Nares normal. Septum midline. Mucosa normal. No drainage or sinus tenderness. Throat: lips, mucosa, and tongue normal; teeth and gums normal Neck: no adenopathy, no carotid bruit, no JVD, supple, symmetrical, trachea midline and thyroid not enlarged, symmetric, no tenderness/mass/nodules Back: symmetric, no curvature. ROM normal. No CVA tenderness. Resp: clear to auscultation bilaterally wheezes in left lung Cardio: regular rate and rhythm, S1, S2 normal, no murmur, click, rub or gallop GI: soft, non-tender; bowel sounds normal; no masses,  no organomegaly Extremities: extremities normal, atraumatic, no cyanosis or edema Pulses: 2+ and symmetric Skin: Skin color, texture, turgor normal. No rashes or lesions Neurologic: right side 3+ to 4 upper and 2-3 lower. Left lean is imporoving with better truncal control. Right central 7 and tongue deviation, speech slightly dysarthric. No hiccups YN:WGNFA knee with mild  crepitus, minimal joint line pain, no effusion.   Assessment/Plan: 1. Functional deficits secondary to left pontine infarct which require 3+ hours per day of interdisciplinary therapy in a comprehensive inpatient rehab setting. Physiatrist is providing close team supervision and 24 hour management of active medical problems listed below. Physiatrist and rehab team continue to assess barriers to discharge/monitor patient progress toward functional and medical goals.  May be looking at snf.  FIM: FIM - Bathing Bathing Steps Patient Completed: Chest;Right Arm;Left Arm;Abdomen;Front perineal area;Right upper leg;Left upper leg Bathing: 3: Mod-Patient completes 5-7 31f 10 parts or 50-74%  FIM - Upper Body Dressing/Undressing Upper body dressing/undressing steps patient completed: Thread/unthread right sleeve of pullover shirt/dresss;Thread/unthread left sleeve of pullover shirt/dress;Put head through opening of pull over shirt/dress;Pull shirt over trunk Upper body dressing/undressing: 5: Supervision: Safety issues/verbal cues FIM - Lower Body Dressing/Undressing Lower body dressing/undressing steps patient completed: Thread/unthread right pants leg;Thread/unthread left pants leg;Don/Doff left shoe Lower body dressing/undressing: 2: Max-Patient completed 25-49% of tasks  FIM - Toileting Toileting steps completed by patient: Performs perineal hygiene Toileting Assistive Devices: Grab bar or rail for support Toileting: 2: Max-Patient completed 1 of 3 steps  FIM - Diplomatic Services operational officer Devices: Elevated toilet seat;Grab bars;Walker Toilet Transfers: 4-To toilet/BSC: Min A (steadying Pt. > 75%);4-From toilet/BSC: Min A (steadying Pt. > 75%)  FIM - Banker Devices: Walker;Arm rests Bed/Chair Transfer: 4: Bed > Chair or W/C: Min A (steadying Pt. > 75%);4: Chair or W/C > Bed: Min A (steadying Pt. > 75%)  FIM - Locomotion:  Wheelchair Distance: 40 Locomotion: Wheelchair: 1: Travels less than 50 ft with minimal assistance (Pt.>75%) FIM - Locomotion: Ambulation Locomotion: Ambulation Assistive Devices: Designer, industrial/product Ambulation/Gait Assistance: 4: Min assist Locomotion: Ambulation: 1: Merchant navy officer  less than 50 ft with moderate assistance (Pt: 50 - 74%)  Comprehension Comprehension Mode: Auditory Comprehension: 5-Understands complex 90% of the time/Cues < 10% of the time  Expression Expression Mode: Verbal Expression: 4-Expresses basic 75 - 89% of the time/requires cueing 10 - 24% of the time. Needs helper to occlude trach/needs to repeat words.  Social Interaction Social Interaction: 5-Interacts appropriately 90% of the time - Needs monitoring or encouragement for participation or interaction.  Problem Solving Problem Solving: 5-Solves complex 90% of the time/cues < 10% of the time  Memory Memory: 5-Recognizes or recalls 90% of the time/requires cueing < 10% of the time  1. Left paracentral pontine infarct  2. DVT Prophylaxis/Anticoagulation: Subcutaneous Lovenox. Monitor platelet counts and any signs of bleeding  3. diabetes mellitus. Hemoglobin A1c 5.8. Presently with sliding scale insulin. Patient on Glucotrol 5 mg twice a day prior to admission. Glucotrol resumed at 5mg  qam. 4. Neuropsych: This patient is capable of making decisions on his/her own behalf.  5. History of renal transplant March of 2012. Continue immunosuppressants medicines as per renal services  6. GERD. Protonix  7. History of prostate cancer. PTA Bactrim 3 times weekly for bladder prophylaxis.  -bactrim held  -ecoli uti- keflex  -urinary retention seems to have improved with rx of UTI. Still with occasional incontinence 8. Hiccups: low dose baclofen tid-->PRN. Hiccups have stopped 9. Right knee instability: mild OA likely with associated muscle weakness  -consider knee sleeve if persistent- therapy doesn't feel knee brace will be  enough to realistically stabilize the knee      LOS (Days) 9 A FACE TO FACE EVALUATION WAS PERFORMED  Kemesha Mosey T 02/06/2012, 8:22 AM

## 2012-02-06 NOTE — Progress Notes (Signed)
Speech Language Pathology Daily Session Note [employer]  Patient Details  Name: Evan [name] MRN: [id number] Date of Birth: 05/23/1932  Today's Date: 02/06/2012 Time: 4098-1191 Time Calculation (min): 30 min  Short Term Goals: Week 2: SLP Short Term Goal 1 (Week 2): Pt will utilize speech intelligibility strategies at the conversation level with supervision verbal cues and question cues.  SLP Short Term Goal 2 (Week 2): Pt will consume Dys. 3 textures and thin liquids without overt s/s of aspiration.  SLP Short Term Goal 3 (Week 2): Pt will utilize swallowing compensatory strategies with mod I.   Skilled Therapeutic Interventions: Pt seen in [employer] with focus on utilization of swallowing compensatory strategies and speech intelligibility strategies. Pt required intermittent supervision verbal cues to utilize small, single sips via straw and demonstrated cough X 2. Pt also required supervision verbal cues to utilize increased vocal intensity and over articulation to increase speech intelligibility at the conversation level.    FIM:  Comprehension Comprehension Mode: Auditory Comprehension: 5-Understands complex 90% of the time/Cues < 10% of the time Expression Expression Mode: Verbal Expression: 4-Expresses basic 75 - 89% of the time/requires cueing 10 - 24% of the time. Needs helper to occlude trach/needs to repeat words. Social Interaction Social Interaction: 5-Interacts appropriately 90% of the time - Needs monitoring or encouragement for participation or interaction. Problem Solving Problem Solving: 5-Solves complex 90% of the time/cues < 10% of the time Memory Memory: 5-Recognizes or recalls 90% of the time/requires cueing < 10% of the time FIM - Eating Eating Activity: 5: Supervision/cues  Pain Pain Assessment Pain Assessment: No/denies pain  Therapy/Group: Dysphagia Group  Saivion Goettel 02/06/2012, 5:21 PM

## 2012-02-06 NOTE — Care Management Note (Signed)
Per State Regulation 482.30 This chart was reviewed for medical necessity with respect to the patient's Admission/Duration of stay. Pt participating & progressing, but slowly, & goals are to be downgraded.  R knee "goes out" unexpectedly & pt w/ poor balance reactions.  Pt is motivated--would benefit from continued therapy at SNF after CIR.   Brock Ra                 Nurse Care Manager              Next Review Date: 02/12/12

## 2012-02-06 NOTE — Patient Care Conference (Signed)
Inpatient RehabilitationTeam Conference Note Date: 02/05/2012   Time: 3:20 PM    Patient Name: Evan [name]      Medical Record Number: [id number]  Date of Birth: [birth year] Sex: Male         Room/Bed: 4142/4142-01 Payor Info: Payor: MEDICARE  Plan: MEDICARE PART A AND B  Product Type: *No Product type*     Admitting Diagnosis: LT CVA  Admit Date/Time:  01/28/2012  3:49 PM Admission Comments: No comment available   Primary Diagnosis:  CVA (cerebral infarction) Principal Problem: CVA (cerebral infarction)  Patient Active Problem List   Diagnosis Date Noted  . Aortic stenosis 01/28/2012  . CVA (cerebral infarction) 01/28/2012  . Diabetes mellitus type II 01/24/2012  . CVA (cerebral vascular accident) 01/24/2012  . History of renal transplant 01/24/2012  . H/O: duodenal ulcer 01/24/2012  . H/O atrial fibrillation without current medication 01/24/2012  . CA prostate, adenoca 01/24/2012    Expected Discharge Date: Expected Discharge Date: 02/19/12  Team Members Present: Physician: Dr. Faith Rogue Case Manager Present: Melanee Spry, RN Social Worker Present: Amada Jupiter, LCSW Nurse Present: Other (comment) Forest Gleason, RN) PT Present: Reggy Eye, PT OT Present: Ardis Rowan, COTA;Jennifer Katrinka Blazing, OT SLP Present: Feliberto Gottron, SLP Other (Discipline and Name): Tora Duck, PPS Coordinator     Current Status/Progress Goal Weekly Team Focus  Medical   hiccups better. right knee instability (which was premorbid) is further complicating his stability, slow safety reaction  improve truncal control, balance, stamina   rx UTI, incontinence    Bowel/Bladder   Continent of bowel. Incontinet of bladder; uses depends and peri pads, toliet every 3 hours.  Bladder: manage own depends/peri pads. Bowel: continent  Up to bathroom or bedside commode   Swallow/Nutrition/ Hydration   Dys. 3 textures, thin liquids, supervision verbal cues  Mod I  utilziation of multiple bites and small sips  via straw    ADL's   mod to max  supervision  activity tolerance, RUE use, standing tolerance and balance   Mobility   min-mod A  supervision  activity tolerance, strength   Communication   Min A  Mod I  utilization of speech intelligibility strategies   Safety/Cognition/ Behavioral Observations  Bed alarm, quick release in wheelchair, and side rails up x 3  No falls with injury  Progress out of quick release   Pain   No complaints of pain  Less than 3      Skin   Intact  No breakdown         *See Interdisciplinary Assessment and Plan and progress notes for long and short-term goals  Barriers to Discharge: neuro sequelae, slow safety reaction    Possible Resolutions to Barriers:  ?condom cath to help with continence    Discharge Planning/Teaching Needs:  Home with wife and daughter to provide 24/7 assistance      Team Discussion: Pt progressing slower than expected--goals changing to increased care needs, which wife may not be able to provide--may need SNF as d/c plan.   Revisions to Treatment Plan: downgrading goals to Min Assist    Continued Need for Acute Rehabilitation Level of Care: The patient requires daily medical management by a physician with specialized training in physical medicine and rehabilitation for the following conditions: Daily direction of a multidisciplinary physical rehabilitation program to ensure safe treatment while eliciting the highest outcome that is of practical value to the patient.: Yes Daily medical management of patient stability for increased activity during participation in  an intensive rehabilitation regime.: Yes Daily analysis of laboratory values and/or radiology reports with any subsequent need for medication adjustment of medical intervention for : Neurological problems;Post surgical problems;Other  Brock Ra 02/06/2012, 11:23 AM

## 2012-02-06 NOTE — Progress Notes (Signed)
Occupational Therapy Weekly Progress Note  Patient Details  Name: Evan [name] MRN: [id number] Date of Birth: 08-29-1931  Today's Date: 02/06/2012    Patient has met 2 of 4 short term goals.  Pt has progressed slowly this past week.  Pt continues to require extra time to complete all BADLs.  Pt performing sit<>stand with steady A/min A with verbal cues for BUE/BLE positioning prior to standing.  Pt is mod A for bathing, supervision for UB dressing, max A for LB dressing, tot A for toileting, and min A for toilet transfers and shower transfers.  Patient continues to demonstrate the following deficits:muscle weakness and muscle joint tightness, impaired timing and sequencing, abnormal tone, unbalanced muscle activation and decreased coordination and decreased standing balance and decreased balance strategies and therefore will continue to benefit from skilled OT intervention to enhance overall performance with BADL.  Patient progressing toward long term goals..  Continue plan of care.  OT Short Term Goals Week 1:  OT Short Term Goal 1 (Week 1): Pt will transfer to [facility] with mod A consistantly with RW OT Short Term Goal 1 - Progress (Week 1): Met OT Short Term Goal 2 (Week 1): Pt will perform toileting with max A OT Short Term Goal 2 - Progress (Week 1): Progressing toward goal OT Short Term Goal 3 (Week 1): Pt will thread pants/ underwear with mod A OT Short Term Goal 3 - Progress (Week 1): Met OT Short Term Goal 4 (Week 1): Pt will stand with mod to perform one grooming task OT Short Term Goal 4 - Progress (Week 1): Progressing toward goal   Therapy Documentation Precautions:  Precautions Precautions: Fall Restrictions Weight Bearing Restrictions: No   ADL: ADL Eating: Supervision/safety Where Assessed-Eating: Chair Grooming: Supervision/safety Where Assessed-Grooming: Sitting at sink Upper Body Bathing: Supervision/safety Where Assessed-Upper Body Bathing: Sitting at sink Lower  Body Bathing: Moderate assistance Where Assessed-Lower Body Bathing: Sitting at sink;Standing at sink Upper Body Dressing: Supervision/safety Where Assessed-Upper Body Dressing: Sitting at sink Lower Body Dressing: Maximal assistance Where Assessed-Lower Body Dressing: Standing at sink;Sitting at sink Toileting: Dependent Where Assessed-Toileting: Teacher, adult education: Curator Method: Surveyor, minerals: Grab bars Tub/Shower Transfer: Minimal Radiation protection practitioner Method: Engineer, technical sales: Shower seat with back  See FIM for current functional status   Rich Brave 02/06/2012, 1:25 PM

## 2012-02-06 NOTE — Progress Notes (Signed)
Occupational Therapy Note  Patient Details  Name: Evan [name] MRN: [id number] Date of Birth: Jul 18, 1932 Today's Date: 02/06/2012  Time: 1130-1145 Pt denies pain Individual Therapy Pt participated in self feeding group with focus on swallowing strategies and RUE use for opening containers and using utensil appropriately.  Pt required min verbal cues for swallowing strategies.  Pt using RUE independently throughout session.   Lavone Neri [facility] 02/06/2012, 3:08 PM

## 2012-02-06 NOTE — Progress Notes (Signed)
Social Work Patient ID: Evan [name], male   DOB: 1932-02-06, 78 y.o.   MRN: [id number]   Met with patient, wife and daughter yesterday afternoon to review team conference and discuss d/c plans.  Explained to all that team reports slow gains with patient and concern that his assistance needs at targeted d/c date of 7/16 may be greater than wife can provide. Explained that overall goals have been downgraded to minimal assistance (at best).  All are agreed that this is more than wife can manage. Explained the options of hiring private duty caregiver and having HH therapies vs SNF.  Pt and wife are in agreement to pursue SNF from here to continue with daily therapies and hope of eventually reaching a supervision level.  Reviewed area facilities and noted pt and family preferences.  Will begin process of SNF placement.  [name]

## 2012-02-06 NOTE — Progress Notes (Signed)
Occupational Therapy Session Note  Patient Details  Name: Evan [name] MRN: [id number] Date of Birth: 01-10-1932  Today's Date: 02/06/2012 Time: 0800-0915 Time Calculation (min): 75 min  Short Term Goals: Week 1:  OT Short Term Goal 1 (Week 1): Pt will transfer to [facility] with mod A consistantly with RW OT Short Term Goal 2 (Week 1): Pt will perform toileting with max A OT Short Term Goal 3 (Week 1): Pt will thread pants/ underwear with mod A OT Short Term Goal 4 (Week 1): Pt will stand with mod to perform one grooming task  Skilled Therapeutic Interventions/Progress Updates:    Pt eating breakfast at beginning of session.  Pt engaged in dressing tasks only secondary to time constraints.  Pt required 30 mins at beginning of session for breakfast.  Pt receives an evening bath from nursing staff.  Pt stated he felt better this morning.  Pt continues to require extra time for all tasks.  Pt performed sit<>stand with steady assist and required steady assist while standing to pull up pants.  Pt exhibited LOB x 1 and required assistance to correct.  Pt exhibits delayed balance reactions.  Focus on activity tolerance, standing balance, and safety awareness.  Therapy Documentation Precautions:  Precautions Precautions: Fall Restrictions Weight Bearing Restrictions: No  See FIM for current functional status  Pt denies pain Therapy/Group: Individual Therapy  Rich Brave 02/06/2012, 1:17 PM

## 2012-02-07 LAB — GLUCOSE, CAPILLARY: Glucose-Capillary: 160 mg/dL — ABNORMAL HIGH (ref 70–99)

## 2012-02-07 NOTE — Progress Notes (Signed)
Speech Language Pathology Daily Session Note  Patient Details  Name: Evan [name] MRN: [id number] Date of Birth: [birth year]  Today's Date: 02/07/2012 Time: 4098-1191 Time Calculation (min): 30 min  Short Term Goals: Week 1: SLP Short Term Goal 1 (Week 1): Pt will utlize speech intelligibility strategies at the conversation level with supervision verbal and questioning cues.  SLP Short Term Goal 1 - Progress (Week 1): Progressing toward goal SLP Short Term Goal 2 (Week 1): Pt will consume regular textures and thin liquids without overt s/s of aspiration with Mod I.  SLP Short Term Goal 2 - Progress (Week 1): Discontinued (comment) SLP Short Term Goal 3 (Week 1): Pt will utlize swallowing compensatory strategies with supervision verbal cues.  SLP Short Term Goal 3 - Progress (Week 1): Met SLP Short Term Goal 4 (Week 1): Patient will demonstrate diaphragmatic breathing technique with use of printed instructions, with 80% accuracy.  Skilled Therapeutic Interventions: Patient seen to address expressive speech goals with focus on use of articulation strategies at conversation and oral reading levels, and use of diaphragmatic breathing technique. Patient expressed that is voice was much softer than he was used, SLP provided education regarding  breath support and vocal intensity. After SLP instructed and educated patient, he was able to utilize written instructions to return demonstrate use of diaphragmatic breathing and expressive speech/articulation strategies.   FIM:  Comprehension Comprehension Mode: Auditory Comprehension: 5-Understands basic 90% of the time/requires cueing < 10% of the time Expression Expression Mode: Verbal Expression: 5-Expresses basic 90% of the time/requires cueing < 10% of the time. Social Interaction Social Interaction: 6-Interacts appropriately with others with medication or extra time (anti-anxiety, antidepressant). Problem Solving Problem Solving: 5-Solves  basic 90% of the time/requires cueing < 10% of the time Memory Memory: 5-Recognizes or recalls 90% of the time/requires cueing < 10% of the time FIM - Eating Eating Activity: 5: Supervision/cues  Pain Pain Assessment Pain Assessment: No/denies pain Pain Score: 0-No pain  Therapy/Group: Individual Therapy  Pablo Lawrence 02/07/2012, 4:42 PM  Angela Nevin, MA, CCC-SLP [facility] Speech-Language Pathologist

## 2012-02-07 NOTE — Progress Notes (Signed)
Physical Therapy Note  Patient Details  Name: Evan [name] MRN: [id number] Date of Birth: 23-Jul-1932 Today's Date: 02/07/2012  Time: 1330-1430 60 minutes  No c/o pain.  Bathroom mobility, gait, transfers and balance with RW.  Pt required min A for sit to stand from commode.  Min A for balance with hygiene, max cuing for posture.  Min A with gait with RW in household environment, min A for standing with hand washing due to R knee weakness causing buckling.  Gait training sideways/backwards for focus on household mobility and hip strengthening, pt required min-mod A due to weak R knee.  Partial sit to stands for quad strengthening, pt fatigues easily but improved with repetition.  Seated trunk PNFs with ball for trunk strength and endurance.  Gait in controlled environment 50', 100' with focus on increased step/stride length and foot clearance. Pt continues to be limited by decreased strength and endurance.  Individual therapy   DONAWERTH,KAREN 02/07/2012, 7:48 AM

## 2012-02-07 NOTE — Progress Notes (Signed)
Speech Language Pathology Daily Session Note [employer]  Patient Details  Name: Evan [name] MRN: [id number] Date of Birth: May 14, 1932  Today's Date: 02/07/2012 Time: 1200-1230 Time Calculation (min): 30 min  Short Term Goals: Week 2: SLP Short Term Goal 1 (Week 2): Pt will utilize speech intelligibility strategies at the conversation level with supervision verbal cues and question cues.  SLP Short Term Goal 2 (Week 2): Pt will consume Dys. 3 textures and thin liquids without overt s/s of aspiration.  SLP Short Term Goal 3 (Week 2): Pt will utilize swallowing compensatory strategies with mod I.   Skilled Therapeutic Interventions: Pt seen in [employer] with focus on utilization of swallowing compensatory strategies and speech intelligibility strategies. Pt required Min verbal cues to utilize small, single sips via straw and demonstrated cough X 2 due to mixed consistencies. Pt also required supervision verbal cues to utilize increased vocal intensity and over articulation to increase speech intelligibility at the conversation level.    FIM:  Comprehension Comprehension Mode: Auditory Comprehension: 5-Understands basic 90% of the time/requires cueing < 10% of the time Expression Expression Mode: Verbal Expression: 4-Expresses basic 75 - 89% of the time/requires cueing 10 - 24% of the time. Needs helper to occlude trach/needs to repeat words. Social Interaction Social Interaction: 6-Interacts appropriately with others with medication or extra time (anti-anxiety, antidepressant). Problem Solving Problem Solving: 5-Solves basic 90% of the time/requires cueing < 10% of the time Memory Memory: 5-Recognizes or recalls 90% of the time/requires cueing < 10% of the time FIM - Eating Eating Activity: 5: Supervision/cues  Pain Pain Assessment Pain Assessment: No/denies pain  Therapy/Group: Dysphagia Group  [name] 02/07/2012, 4:30 PM

## 2012-02-07 NOTE — Progress Notes (Signed)
Occupational Therapy Note  Patient Details  Name: Evan [name] MRN: [id number] Date of Birth: June 11, 1932 Today's Date: 02/07/2012  Time: 1130-1200 Pt denies pain Group Therapy Pt participated in self feeding group with focus on BUE use for opening containers and self feeding.  Pt required min verbal cues for swallowing strategies.  Pt interacted appropriately with participants and initiated conversation with other participants.  Pt assisted with placing food items from tray onto table and cleaning up after his meal.   Rich Brave 02/07/2012, 3:05 PM

## 2012-02-07 NOTE — Progress Notes (Signed)
Occupational Therapy Session Note  Patient Details  Name: Evan [name] MRN: [id number] Date of Birth: 09-29-1931  Today's Date: 02/07/2012 Time: 0815-0915 Time Calculation (min):  Short Term Goals: Week 2:   =LTG  Skilled Therapeutic Interventions/Progress Updates:    Pt engaged in bathing at shower level and dressing with sit to stand from w/c.  Pt's right knee buckled during stand pivot transfer to tub bench in shower.  Pt stood X 3 in shower without exhibiting weakness in right knee.  Pt required BUE support when standing in shower and when standing to pull up pants.  Pt required extra time to complete all tasks and stated that he didn't sleep well the previous night.  Focus on activity tolerance, standing balance, and safety awareness.  Therapy Documentation Precautions:  Precautions Precautions: Fall Restrictions Weight Bearing Restrictions: No   Pain:  Pt denies pain  See FIM for current functional status  Therapy/Group: Individual Therapy  Rich Brave 02/07/2012, 1:11 PM

## 2012-02-07 NOTE — Progress Notes (Signed)
Physical Therapy Note  Patient Details  Name: Evan [name] MRN: [id number] Date of Birth: 1931/09/09 Today's Date: 02/07/2012  Time: [phone] 58 minutes  No c/o pain.  Gait training in controlled environment with focus on increased step/stride length and upright posture.  Pt required min manual facilitation for wt shifts to increase step length.  Stair training 3 stairs with B handrails with min A.  Step ups and tap ups to 4'' step with focus on R knee concentric and eccentric control.  Pt requires max A for eccentric control of R knee, min A for concentric step ups with R knee.  Fatigues easily.  Standing balance training with reach and bend, pt with 2 LOB with bending to R requiring mod-max A to correct, no LOB with bending L. Tactile cues for upright posture.  Supine therex with focus on strengthening lower abdominals to assist with supine <> sit transfers.  Pt fatigues easily, continues to require min-mod A for supine <> sit.  Individual therapy   Esteven Overfelt 02/07/2012, 12:27 PM

## 2012-02-07 NOTE — Progress Notes (Signed)
Patient ID: Evan [name], male   DOB: [birth year], 78 y.o.   MRN: [id number]  Subjective/Complaints: Complains of runny nose. Voiding more Urinary retention requiring I/O cath Other ros items negative  Objective: Vital Signs: Blood pressure 162/66, pulse 72, temperature 98.4 F (36.9 C), temperature source Oral, resp. rate 18, height 5' 10.87" (1.8 m), weight 78.6 kg (173 lb 4.5 oz), SpO2 97.00%. No results found. No results found for this basename: WBC:2,HGB:2,HCT:2,PLT:2 in the last 72 hours No results found for this basename: NA:2,K:2,CL:2,CO2:2,GLUCOSE:2,BUN:2,CREATININE:2,CALCIUM:2 in the last 72 hours CBG (last 3)   Basename 02/07/12 0719 02/06/12 2033 02/06/12 1643  GLUCAP 118* 172* 177*    Wt Readings from Last 3 Encounters:  02/06/12 78.6 kg (173 lb 4.5 oz)  01/24/12 76 kg (167 lb 8.8 oz)  01/08/12 78.926 kg (174 lb)    Physical Exam:  General appearance: alert, cooperative and no distress Head: Normocephalic, without obvious abnormality, atraumatic Eyes: conjunctivae/corneas clear. PERRL, EOM's intact.  Ears: ext nl Nose: Nares normal. Septum midline. Mucosa normal. No drainage or sinus tenderness. Throat: lips, mucosa, and tongue normal; teeth and gums normal Neck: no adenopathy, no carotid bruit, no JVD, supple, symmetrical, trachea midline and thyroid not enlarged, symmetric, no tenderness/mass/nodules Back: symmetric, no curvature. ROM normal. No CVA tenderness. Resp: clear to auscultation bilaterally wheezes in left lung Cardio: regular rate and rhythm, S1, S2 normal, no murmur, click, rub or gallop GI: soft, non-tender; bowel sounds normal; no masses,  no organomegaly Extremities: extremities normal, atraumatic, no cyanosis or edema Pulses: 2+ and symmetric Skin: Skin color, texture, turgor normal. No rashes or lesions Neurologic: right side 3+ to 4 upper and 2-3 lower. Fair sitting balance.. Right central 7 and tongue deviation, speech slightly dysarthric. No  hiccups WU:JWJXB knee with mild crepitus, minimal joint line pain, no effusion.   Assessment/Plan: 1. Functional deficits secondary to left pontine infarct which require 3+ hours per day of interdisciplinary therapy in a comprehensive inpatient rehab setting. Physiatrist is providing close team supervision and 24 hour management of active medical problems listed below. Physiatrist and rehab team continue to assess barriers to discharge/monitor patient progress toward functional and medical goals.   FIM: FIM - Bathing Bathing Steps Patient Completed: Chest;Right Arm;Left Arm;Abdomen;Front perineal area;Right upper leg;Left upper leg Bathing: 3: Mod-Patient completes 5-7 65f 10 parts or 50-74%  FIM - Upper Body Dressing/Undressing Upper body dressing/undressing steps patient completed: Thread/unthread right sleeve of pullover shirt/dresss;Thread/unthread left sleeve of pullover shirt/dress;Put head through opening of pull over shirt/dress;Pull shirt over trunk Upper body dressing/undressing: 5: Supervision: Safety issues/verbal cues FIM - Lower Body Dressing/Undressing Lower body dressing/undressing steps patient completed: Thread/unthread left pants leg;Pull pants up/down Lower body dressing/undressing: 2: Max-Patient completed 25-49% of tasks  FIM - Toileting Toileting steps completed by patient: Performs perineal hygiene Toileting Assistive Devices: Grab bar or rail for support Toileting: 2: Max-Patient completed 1 of 3 steps  FIM - Diplomatic Services operational officer Devices: Elevated toilet seat;Walker;Grab bars Toilet Transfers: 4-To toilet/BSC: Min A (steadying Pt. > 75%);4-From toilet/BSC: Min A (steadying Pt. > 75%)  FIM - Banker Devices: Walker;Arm rests Bed/Chair Transfer: 4: Bed > Chair or W/C: Min A (steadying Pt. > 75%);4: Chair or W/C > Bed: Min A (steadying Pt. > 75%)  FIM - Locomotion: Wheelchair Distance:  40 Locomotion: Wheelchair: 1: Travels less than 50 ft with minimal assistance (Pt.>75%) FIM - Locomotion: Ambulation Locomotion: Ambulation Assistive Devices: Designer, industrial/product Ambulation/Gait Assistance: 4: Min assist Locomotion:  Ambulation: 1: Travels less than 50 ft with moderate assistance (Pt: 50 - 74%)  Comprehension Comprehension Mode: Auditory Comprehension: 5-Understands complex 90% of the time/Cues < 10% of the time  Expression Expression Mode: Verbal Expression: 4-Expresses basic 75 - 89% of the time/requires cueing 10 - 24% of the time. Needs helper to occlude trach/needs to repeat words.  Social Interaction Social Interaction: 5-Interacts appropriately 90% of the time - Needs monitoring or encouragement for participation or interaction.  Problem Solving Problem Solving: 5-Solves complex 90% of the time/cues < 10% of the time  Memory Memory: 5-Recognizes or recalls 90% of the time/requires cueing < 10% of the time  1. Left paracentral pontine infarct  2. DVT Prophylaxis/Anticoagulation: Subcutaneous Lovenox. Monitor platelet counts and any signs of bleeding  3. diabetes mellitus. Hemoglobin A1c 5.8. Presently with sliding scale insulin. Patient on Glucotrol 5 mg twice a day prior to admission. Glucotrol resumed at 5mg  qam with fair control thus far. 4. Neuropsych: This patient is capable of making decisions on his/her own behalf.  5. History of renal transplant March of 2012. Continue immunosuppressants medicines as per renal services  6. GERD. Protonix  7. History of prostate cancer. PTA Bactrim 3 times weekly for bladder prophylaxis.  -bactrim held  -ecoli uti- keflex completed. i will reculture due to his complaints of frequency.  -urinary retention better.. Still with occasional incontinence 8. Hiccups: low dose baclofen tid PRN. Hiccups have stopped 9. Right knee instability: mild OA likely with associated muscle weakness  -consider knee sleeve if persistent-  therapy doesn't feel knee brace will be enough to realistically stabilize the knee      LOS (Days) 10 A FACE TO FACE EVALUATION WAS PERFORMED  SWARTZ,ZACHARY T 02/07/2012, 8:32 AM

## 2012-02-08 DIAGNOSIS — I6330 Cerebral infarction due to thrombosis of unspecified cerebral artery: Secondary | ICD-10-CM

## 2012-02-08 DIAGNOSIS — Z5189 Encounter for other specified aftercare: Secondary | ICD-10-CM

## 2012-02-08 DIAGNOSIS — N186 End stage renal disease: Secondary | ICD-10-CM

## 2012-02-08 DIAGNOSIS — N390 Urinary tract infection, site not specified: Secondary | ICD-10-CM

## 2012-02-08 LAB — GLUCOSE, CAPILLARY
Glucose-Capillary: 158 mg/dL — ABNORMAL HIGH (ref 70–99)
Glucose-Capillary: 145 mg/dL — ABNORMAL HIGH (ref 70–99)

## 2012-02-08 MED ORDER — CALCIUM CARBONATE ANTACID 500 MG PO CHEW
1.0000 | CHEWABLE_TABLET | Freq: Three times a day (TID) | ORAL | Status: DC
  Administered 2012-02-08 – 2012-02-14 (×18): 200 mg via ORAL
  Filled 2012-02-08 (×21): qty 1

## 2012-02-08 NOTE — Progress Notes (Signed)
Occupational Therapy Session Note  Patient Details  Name: Evan [name] MRN: [id number] Date of Birth: 1932-04-02  Today's Date: 02/08/2012 Time: 0800-0915 Time Calculation (min): Pt denies pain Short Term Goals: Week 2:   =LTG  Skilled Therapeutic Interventions/Progress Updates:    Pt engaged in dressing and grooming activities only this morning.  Pt opted not to wear condom cath this day.  Pt required min assist with sit to stand to urinate and change brief in preparation for dressing tasks.  Pt required steady assist to stand for approx 3 mins.  Pt required assistance with pulling shirt over head.  Pt able to thread pants legs this morning but required assistance with pulling them up.  Pt required more assistance this morning to complete dressing activities and overall performance of tasks fluctuates daily.  Pt brushed teeth while seated at sink but stood at sink to brush hair.  Pt requires extra time to complete all tasks and complains of being fatigues at end of session.  Focus on activity tolerance, dynamic standing balance, and safety awareness.  Therapy Documentation Precautions:  Precautions Precautions: Fall Restrictions Weight Bearing Restrictions: No  See FIM for current functional status  Therapy/Group: Individual Therapy  Rich Brave 02/08/2012, 1:17 PM

## 2012-02-08 NOTE — Plan of Care (Signed)
Problem: RH KNOWLEDGE DEFICIT Goal: RH STG INCREASE KNOWLEDGE OF HYPERTENSION With min assist  Pt will state what hypertension is and the signs and symptoms associated with hypertension

## 2012-02-08 NOTE — Progress Notes (Signed)
Speech Language Pathology Daily Session Note [employer]  Patient Details  Name: Evan [name] MRN: [id number] Date of Birth: 05-Apr-1932  Today's Date: 02/08/2012 Time: 1150-1220 Time Calculation (min): 30 min  Short Term Goals: Week 2: SLP Short Term Goal 1 (Week 2): Pt will utilize speech intelligibility strategies at the conversation level with supervision verbal cues and question cues.  SLP Short Term Goal 2 (Week 2): Pt will consume Dys. 3 textures and thin liquids without overt s/s of aspiration.  SLP Short Term Goal 3 (Week 2): Pt will utilize swallowing compensatory strategies with mod I.   Skilled Therapeutic Interventions: Pt seen in [employer] with focus on utilization of swallowing compensatory strategies and speech intelligibility at the conversation level. Pt required Min A verbal cues to utilize small bites throughout meal and demonstrated cough x 2. Pt's swallowing function also impacted by constant hiccups throughout the meal. Pt required Min A verbal cues for utilization of speech intelligibility strategies.    FIM:  Comprehension Comprehension: 5-Follows basic conversation/direction: With extra time/assistive device Expression Expression Mode: Verbal Expression: 4-Expresses basic 75 - 89% of the time/requires cueing 10 - 24% of the time. Needs helper to occlude trach/needs to repeat words. Social Interaction Social Interaction: 6-Interacts appropriately with others with medication or extra time (anti-anxiety, antidepressant). Problem Solving Problem Solving: 5-Solves complex 90% of the time/cues < 10% of the time Memory Memory: 6-More than reasonable amt of time FIM - Eating Eating Activity: 5: Supervision/cues  Pain Pain Assessment Pain Assessment: No/denies pain  Therapy/Group: Dysphagia Group  Jester Klingberg 02/08/2012, 1:51 PM

## 2012-02-08 NOTE — Progress Notes (Signed)
Occupational Therapy Note  Patient Details  Name: Evan [name] MRN: [id number] Date of Birth: [birth year] Today's Date: 02/08/2012  Time: 1130-1150 Pt denies pain Group Therapy Pt participated in self feeding group with focus on BUE use for opening containers and following swallowing strategies.  Pt continues to requires min verbal cues for swallowing strategies.  Pt exhibits tendency to take multiple bites before chewing and swallowing.  Pt had hiccups throughout meal until one period of coughing, after which he no longer had hiccups.   Lavone Neri [facility] 02/08/2012, 3:24 PM

## 2012-02-08 NOTE — Progress Notes (Signed)
Physical Therapy Note  Patient Details  Name: Evan [name] MRN: [id number] Date of Birth: September 02, 1931 Today's Date: 02/08/2012  Time: [phone] 55 minutes  No c/o pain.  Warm up on Nu step x 5 minutes level 4.  Gait training 2 x 100' with focus on increasing step length and foot clearance to decrease shuffling gait.  Pt required max cues and manual facilitation for wt shifts.  Supine <> sit training with pt using log roll technique.  Pt initially min A but able to progress to supervision with repetition and cues for sequencing and technique.  Sit to stand training without AD with focus on use of LE's for strength and endurance.  Pt required mod A at end of session due to R knee buckling/fatigue.  Individual therapy    Varie Machamer 02/08/2012, 10:25 AM

## 2012-02-08 NOTE — Plan of Care (Signed)
Problem: RH BLADDER ELIMINATION Goal: RH STG MANAGE BLADDER WITH ASSISTANCE STG Manage Bladder incontinence With Min Assistance of staff to set up and placement of depends with peripad  Outcome: Progressing Managed with condom cath daily

## 2012-02-08 NOTE — Progress Notes (Signed)
Speech Language Pathology Daily Session Note  Patient Details  Name: Evan [name] MRN: [id number] Date of Birth: 20-Feb-1932  Today's Date: 02/08/2012 Time: 1100-1130 Time Calculation (min): 30 min  Short Term Goals: Week 2: SLP Short Term Goal 1 (Week 2): Pt will utilize speech intelligibility strategies at the conversation level with supervision verbal cues and question cues.  SLP Short Term Goal 2 (Week 2): Pt will consume Dys. 3 textures and thin liquids without overt s/s of aspiration.  SLP Short Term Goal 3 (Week 2): Pt will utilize swallowing compensatory strategies with mod I.   Skilled Therapeutic Interventions: Patient seen with focus on use of articulation strategies at conversation and oral reading levels. Patient required Min A verbal and questioning cues to utilize an increased vocal intensity and over-articulation while reading aloud. Pt was ~80% intelligible. Pt's overall speech intelligibility also impacted by constant hiccups throughout the session.    FIM:  Comprehension Comprehension: 5-Follows basic conversation/direction: With extra time/assistive device Expression Expression Mode: Verbal Expression: 4-Expresses basic 75 - 89% of the time/requires cueing 10 - 24% of the time. Needs helper to occlude trach/needs to repeat words. Social Interaction Social Interaction: 6-Interacts appropriately with others with medication or extra time (anti-anxiety, antidepressant). Problem Solving Problem Solving: 5-Solves complex 90% of the time/cues < 10% of the time Memory Memory: 6-More than reasonable amt of time FIM - Eating Eating Activity: 5: Supervision/cues  Pain Pain Assessment Pain Assessment: No/denies pain  Therapy/Group: Individual Therapy  Teana Lindahl 02/08/2012, 1:48 PM

## 2012-02-09 LAB — URINE CULTURE: Colony Count: >=100000

## 2012-02-09 LAB — BASIC METABOLIC PANEL
CO2: 22 mEq/L (ref 19–32)
Calcium: 8.9 mg/dL (ref 8.4–10.5)
Chloride: 104 mEq/L (ref 96–112)
Sodium: 136 mEq/L (ref 135–145)

## 2012-02-09 LAB — URINALYSIS, MICROSCOPIC ONLY
Glucose, UA: NEGATIVE mg/dL
Nitrite: POSITIVE — AB
Protein, ur: 30 mg/dL — AB

## 2012-02-09 LAB — GLUCOSE, CAPILLARY: Glucose-Capillary: 110 mg/dL — ABNORMAL HIGH (ref 70–99)

## 2012-02-09 LAB — SEDIMENTATION RATE: Sed Rate: 52 mm/hr — ABNORMAL HIGH (ref 0–16)

## 2012-02-09 MED ORDER — DICLOFENAC SODIUM 1 % TD GEL
1.0000 "application " | Freq: Four times a day (QID) | TRANSDERMAL | Status: DC | PRN
  Administered 2012-02-09 – 2012-02-10 (×2): 1 via TOPICAL
  Filled 2012-02-09: qty 100

## 2012-02-09 MED ORDER — CIPROFLOXACIN HCL 500 MG PO TABS
500.0000 mg | ORAL_TABLET | Freq: Two times a day (BID) | ORAL | Status: DC
  Administered 2012-02-09 – 2012-02-11 (×4): 500 mg via ORAL
  Filled 2012-02-09 (×6): qty 1

## 2012-02-09 NOTE — Progress Notes (Signed)
Physical Therapy Note  Patient Details  Name: Evan [name] MRN: [id number] Date of Birth: 11-15-1931 Today's Date: 02/09/2012  1020-1115 (55 minutes) group Pain: no complaint of pain Pt participated in PT group session focused on bilateral LE AROM/strengthening , transfer training. Transfers SPT RW mod assist sit to stand from wc; min assist for transfer; sit to supine mod assist bilateral LEs + poor trunk control sit to supine; passive stretches bilateral hamstrings, hip flexors (sidelying) ; heel slides X 20 , hip abduction X 15; SAQs X 20; rolling rt or left min assist + max vcs for technique; side to sit max assist (pt 20%); sit to stand from raised mat mod assist with visual cues to increase forward trunk lean (X 5) .  Evan [name],JIM 02/09/2012, 11:28 AM

## 2012-02-09 NOTE — Progress Notes (Signed)
Occupational Therapy Session Note  Patient Details  Name: Evan [name] MRN: [id number] Date of Birth: 1931-10-15  Today's Date: 02/09/2012 Time: 1130-1200 Time Calculation (min): 30 min    Skilled Therapeutic Interventions/Progress Updates:    [employer]: focus on setting up his own meal from tray, selective attention throughout meal, bilateral UE/ hand tasks to setup plate, etc  Therapy Documentation Precautions:  Precautions Precautions: Fall Restrictions Weight Bearing Restrictions: No Pain: Pain Assessment Pain Assessment: No/denies pain Pain Score: 0-No pain  See FIM for current functional status  Therapy/Group: Group Therapy  Adan Sis 02/09/2012, 3:44 PM

## 2012-02-09 NOTE — Progress Notes (Signed)
Speech Language Pathology Daily Session Note  Patient Details  Name: Evan [name] MRN: [id number] Date of Birth: 08-24-1931  Today's Date: 02/09/2012 Time: 1200-1225 Time Calculation (min): 25 min  Short Term Goals: Week 2: SLP Short Term Goal 1 (Week 2): Pt will utilize speech intelligibility strategies at the conversation level with supervision verbal cues and question cues.  SLP Short Term Goal 2 (Week 2): Pt will consume Dys. 3 textures and thin liquids without overt s/s of aspiration.  SLP Short Term Goal 3 (Week 2): Pt will utilize swallowing compensatory strategies with mod I.   Skilled Therapeutic Interventions: Group, co-treatment with OT; SLP facilitated session with supervision semantic cues to utilize safe swallow compensatory strategies, which resulted in no overt s/s of aspiraiton with p.o. intake.  Patient required mod assist verbl cues to increase vocal intensity during conversation.    FIM:  FIM - Eating Eating Activity: 5: Supervision/cues;5: Set-up assist for open containers  Pain Pain Assessment Pain Assessment: No/denies pain Pain Score: 0-No pain  Therapy/Group: Group Therapy  Charlane Ferretti., CCC-SLP [phone]  [name] 02/09/2012, 3:19 PM

## 2012-02-09 NOTE — Progress Notes (Signed)
Subjective/Complaints: Complains of R knee pain - hard to bear wt on it. C/o new UTI sx's. Voiding more Urinary retention requiring I/O cath Other ros items negative  Objective: Vital Signs: Blood pressure 166/64, pulse 83, temperature 99 F (37.2 C), temperature source Oral, resp. rate 18, height 5' 10.87" (1.8 m), weight 173 lb 4.5 oz (78.6 kg), SpO2 99.00%. No results found. No results found for this basename: WBC:2,HGB:2,HCT:2,PLT:2 in the last 72 hours No results found for this basename: NA:2,K:2,CL:2,CO2:2,GLUCOSE:2,BUN:2,CREATININE:2,CALCIUM:2 in the last 72 hours CBG (last 3)   Basename 02/09/12 0735 02/08/12 2108 02/08/12 1632  GLUCAP 110* 239* 145*    Wt Readings from Last 3 Encounters:  02/06/12 173 lb 4.5 oz (78.6 kg)  01/24/12 167 lb 8.8 oz (76 kg)  01/08/12 174 lb (78.926 kg)    Physical Exam:  General appearance: alert, cooperative and no distress Head: Normocephalic, without obvious abnormality, atraumatic Eyes: conjunctivae/corneas clear. PERRL, EOM's intact.  Ears: ext nl Nose: Nares normal. Septum midline. Mucosa normal. No drainage or sinus tenderness. Throat: lips, mucosa, and tongue normal; teeth and gums normal Neck: no adenopathy, no carotid bruit, no JVD, supple, symmetrical, trachea midline and thyroid not enlarged, symmetric, no tenderness/mass/nodules Back: symmetric, no curvature. ROM normal. No CVA tenderness. Resp: clear to auscultation bilaterally wheezes in left lung Cardio: regular rate and rhythm, S1, S2 normal, no murmur, click, rub or gallop GI: soft, non-tender; bowel sounds normal; no masses,  no organomegaly Extremities: extremities normal, atraumatic, no cyanosis or edema Pulses: 2+ and symmetric Skin: Skin color, texture, turgor normal. No rashes or lesions Neurologic: right side 3+ to 4 upper and 2-3 lower. Fair sitting balance.. Right central 7 and tongue deviation, speech slightly dysarthric. No hiccups ZO:XWRUE knee with mild  crepitus, minimal joint line pain, no effusion. Heart murmur 2-3/6  Assessment/Plan: 1. Functional deficits secondary to left pontine infarct which require 3+ hours per day of interdisciplinary therapy in a comprehensive inpatient rehab setting. Physiatrist is providing close team supervision and 24 hour management of active medical problems listed below. Physiatrist and rehab team continue to assess barriers to discharge/monitor patient progress toward functional and medical goals.   FIM: FIM - Bathing Bathing Steps Patient Completed: Chest;Right Arm;Left Arm;Abdomen;Front perineal area;Right upper leg;Left upper leg Bathing: 3: Mod-Patient completes 5-7 52f 10 parts or 50-74%  FIM - Upper Body Dressing/Undressing Upper body dressing/undressing steps patient completed: Thread/unthread right sleeve of pullover shirt/dresss;Thread/unthread left sleeve of pullover shirt/dress;Pull shirt over trunk Upper body dressing/undressing: 4: Min-Patient completed 75 plus % of tasks FIM - Lower Body Dressing/Undressing Lower body dressing/undressing steps patient completed: Thread/unthread right pants leg;Thread/unthread left pants leg Lower body dressing/undressing: 1: Total-Patient completed less than 25% of tasks  FIM - Toileting Toileting steps completed by patient: Performs perineal hygiene Toileting Assistive Devices: Grab bar or rail for support Toileting: 2: Max-Patient completed 1 of 3 steps  FIM - Diplomatic Services operational officer Devices: Elevated toilet seat;Walker;Grab bars Toilet Transfers: 4-To toilet/BSC: Min A (steadying Pt. > 75%);4-From toilet/BSC: Min A (steadying Pt. > 75%)  FIM - Banker Devices: Walker;Arm rests Bed/Chair Transfer: 4: Chair or W/C > Bed: Min A (steadying Pt. > 75%);4: Bed > Chair or W/C: Min A (steadying Pt. > 75%);4: Supine > Sit: Min A (steadying Pt. > 75%/lift 1 leg)  FIM - Locomotion:  Wheelchair Distance: 40 Locomotion: Wheelchair: 1: Total Assistance/staff pushes wheelchair (Pt<25%) FIM - Locomotion: Ambulation Locomotion: Ambulation Assistive Devices: Designer, industrial/product Ambulation/Gait Assistance: 4:  Min assist Locomotion: Ambulation: 2: Travels 50 - 149 ft with minimal assistance (Pt.>75%)  Comprehension Comprehension Mode: Auditory Comprehension: 5-Follows basic conversation/direction: With extra time/assistive device  Expression Expression Mode: Verbal Expression: 4-Expresses basic 75 - 89% of the time/requires cueing 10 - 24% of the time. Needs helper to occlude trach/needs to repeat words.  Social Interaction Social Interaction: 6-Interacts appropriately with others with medication or extra time (anti-anxiety, antidepressant).  Problem Solving Problem Solving: 5-Solves complex 90% of the time/cues < 10% of the time  Memory Memory: 6-More than reasonable amt of time  1. Left paracentral pontine infarct  2. DVT Prophylaxis/Anticoagulation: Subcutaneous Lovenox. Monitor platelet counts and any signs of bleeding  3. diabetes mellitus. Hemoglobin A1c 5.8. Presently with sliding scale insulin. Patient on Glucotrol 5 mg twice a day prior to admission. Glucotrol resumed at 5mg  qam with fair control thus far. 4. Neuropsych: This patient is capable of making decisions on his/her own behalf.  5. History of renal transplant March of 2012. Continue immunosuppressants medicines as per renal services  6. GERD. Protonix  7. History of prostate cancer. PTA Bactrim 3 times weekly for bladder prophylaxis.  -bactrim held  -ecoli uti- keflex completed. i will reculture due to his complaints of frequency.  -urinary retention better.. Still with occasional incontinence 8. Hiccups: low dose baclofen tid PRN. Hiccups have stopped 9. Right knee instability: mild OA likely with associated muscle weakness  -consider knee sleeve if persistent- therapy doesn't feel knee brace will be  enough to realistically stabilize the knee  Voltaren gel, labs 10. Possible UTI. Check UA     LOS (Days) 12 A FACE TO FACE EVALUATION WAS PERFORMED  Alex Plotnikov 02/09/2012, 8:49 AM

## 2012-02-10 LAB — GLUCOSE, CAPILLARY
Glucose-Capillary: 109 mg/dL — ABNORMAL HIGH (ref 70–99)
Glucose-Capillary: 161 mg/dL — ABNORMAL HIGH (ref 70–99)
Glucose-Capillary: 166 mg/dL — ABNORMAL HIGH (ref 70–99)
Glucose-Capillary: 183 mg/dL — ABNORMAL HIGH (ref 70–99)

## 2012-02-10 MED ORDER — MEGESTROL ACETATE 400 MG/10ML PO SUSP
400.0000 mg | Freq: Two times a day (BID) | ORAL | Status: DC
  Administered 2012-02-10 – 2012-02-14 (×9): 400 mg via ORAL
  Filled 2012-02-10 (×11): qty 10

## 2012-02-10 NOTE — Progress Notes (Signed)
Patient ID: Evan [name], male   DOB: [birth year], 78 y.o.   MRN: [id number]  Subjective/Complaints: Complains of R knee pain - hard to bear wt on it - better today on Rx. C/o new UTI sx's - started on Cipro. Voiding more. Urinary retention requiring I/O cath C/o poor appetite - asked to go back on Megace Other ros items negative  Objective: Vital Signs: Blood pressure 157/74, pulse 74, temperature 98.4 F (36.9 C), temperature source Oral, resp. rate 18, height 5' 10.87" (1.8 m), weight 173 lb 4.5 oz (78.6 kg), SpO2 98.00%. No results found. No results found for this basename: WBC:2,HGB:2,HCT:2,PLT:2 in the last 72 hours  Basename 02/09/12 1002  NA 136  K 4.7  CL 104  CO2 22  GLUCOSE 184*  BUN 34*  CREATININE 1.48*  CALCIUM 8.9   CBG (last 3)   Basename 02/10/12 0725 02/09/12 2029 02/09/12 1637  GLUCAP 109* 151* 132*    Wt Readings from Last 3 Encounters:  02/06/12 173 lb 4.5 oz (78.6 kg)  01/24/12 167 lb 8.8 oz (76 kg)  01/08/12 174 lb (78.926 kg)    Physical Exam:  General appearance: alert, cooperative and no distress Head: Normocephalic, without obvious abnormality, atraumatic Eyes: conjunctivae/corneas clear. PERRL, EOM's intact.  Ears: ext nl Nose: Nares normal. Septum midline. Mucosa normal. No drainage or sinus tenderness. Throat: lips, mucosa, and tongue normal; teeth and gums normal Neck: no adenopathy, no carotid bruit, no JVD, supple, symmetrical, trachea midline and thyroid not enlarged, symmetric, no tenderness/mass/nodules Back: symmetric, no curvature. ROM normal. No CVA tenderness. Resp: clear to auscultation bilaterally wheezes in left lung Cardio: regular rate and rhythm, S1, S2 normal, no murmur, click, rub or gallop GI: soft, non-tender; bowel sounds normal; no masses,  no organomegaly Extremities: extremities normal, atraumatic, no cyanosis or edema Pulses: 2+ and symmetric Skin: Skin color, texture, turgor normal. No rashes or  lesions Neurologic: right side 3+ to 4 upper and 2-3 lower. Fair sitting balance.. Right central 7 and tongue deviation, speech slightly dysarthric. No hiccups WG:NFAOZ knee with mild crepitus, minimal joint line pain, no effusion. Heart murmur 2-3/6  Assessment/Plan: 1. Functional deficits secondary to left pontine infarct which require 3+ hours per day of interdisciplinary therapy in a comprehensive inpatient rehab setting. Physiatrist is providing close team supervision and 24 hour management of active medical problems listed below. Physiatrist and rehab team continue to assess barriers to discharge/monitor patient progress toward functional and medical goals.   FIM: FIM - Bathing Bathing Steps Patient Completed: Chest;Right Arm;Left Arm;Abdomen;Front perineal area;Right upper leg;Left upper leg Bathing:  (evening bath)  FIM - Upper Body Dressing/Undressing Upper body dressing/undressing steps patient completed: Thread/unthread right sleeve of pullover shirt/dresss;Thread/unthread left sleeve of pullover shirt/dress;Pull shirt over trunk Upper body dressing/undressing: 4: Min-Patient completed 75 plus % of tasks FIM - Lower Body Dressing/Undressing Lower body dressing/undressing steps patient completed: Thread/unthread right pants leg;Thread/unthread left pants leg Lower body dressing/undressing: 1: Total-Patient completed less than 25% of tasks  FIM - Toileting Toileting steps completed by patient: Performs perineal hygiene;Adjust clothing prior to toileting Toileting Assistive Devices: Grab bar or rail for support Toileting: 3: Mod-Patient completed 2 of 3 steps  FIM - Diplomatic Services operational officer Devices: Art gallery manager Transfers: 4-To toilet/BSC: Min A (steadying Pt. > 75%);4-From toilet/BSC: Min A (steadying Pt. > 75%)  FIM - Bed/Chair Transfer Bed/Chair Transfer Assistive Devices: Manufacturing systems engineer Transfer: 4: Bed > Chair or W/C: Min A (steadying Pt. > 75%);4:  Chair or W/C >  Bed: Min A (steadying Pt. > 75%)  FIM - Locomotion: Wheelchair Distance: 40 Locomotion: Wheelchair: 1: Total Assistance/staff pushes wheelchair (Pt<25%) FIM - Locomotion: Ambulation Locomotion: Ambulation Assistive Devices: Designer, industrial/product Ambulation/Gait Assistance: 4: Min assist Locomotion: Ambulation: 2: Travels 50 - 149 ft with minimal assistance (Pt.>75%)  Comprehension Comprehension Mode: Auditory Comprehension: 5-Follows basic conversation/direction: With extra time/assistive device  Expression Expression Mode: Verbal Expression: 4-Expresses basic 75 - 89% of the time/requires cueing 10 - 24% of the time. Needs helper to occlude trach/needs to repeat words.  Social Interaction Social Interaction: 6-Interacts appropriately with others with medication or extra time (anti-anxiety, antidepressant).  Problem Solving Problem Solving: 5-Solves complex 90% of the time/cues < 10% of the time  Memory Memory: 6-More than reasonable amt of time  Labs reviewed  A/P:  1. Left paracentral pontine infarct  2. DVT Prophylaxis/Anticoagulation: Subcutaneous Lovenox. Monitor platelet counts and any signs of bleeding  3. diabetes mellitus. Hemoglobin A1c 5.8. Presently with sliding scale insulin. Patient on Glucotrol 5 mg twice a day prior to admission. Glucotrol resumed at 5mg  qam with fair control thus far. 4. Neuropsych: This patient is capable of making decisions on his/her own behalf.  5. History of renal transplant March of 2012. Continue immunosuppressants medicines as per renal services  6. GERD. Protonix  7. History of prostate cancer. PTA Bactrim 3 times weekly for bladder prophylaxis.  -bactrim held  -ecoli uti- keflex completed. i will reculture due to his complaints of frequency.  -urinary retention better.. Still with occasional incontinence 8. Hiccups: low dose baclofen tid PRN. Hiccups have stopped 9. Right knee instability: mild OA likely with associated  muscle weakness  -consider knee sleeve if persistent- therapy doesn't feel knee brace will be enough to realistically stabilize the knee  Voltaren gel, labs 10. UTI. Cipro was started w/caution 11. Poor appetite. Started on Megace     LOS (Days) 13 A FACE TO FACE EVALUATION WAS PERFORMED  Alex Jaydence Vanyo 02/10/2012, 9:11 AM

## 2012-02-10 NOTE — Progress Notes (Addendum)
Physical Therapy Session Note  Patient Details  Name: Evan [name] MRN: [id number] Date of Birth: 08-Dec-1931  Today's Date: 02/10/2012 Time: 1105-1200 Time Calculation (min): 55 min  Short Term Goals: Week 2:  PT Short Term Goal 1 (Week 2): Pt will gait 50' with min A in controlled environment PT Short Term Goal 2 (Week 2): Pt will consistently perform sit to stand transfers with min A PT Short Term Goal 3 (Week 2): Pt will demo standing dynamic balance for functional task with min A for 2 minutes  Skilled Therapeutic Interventions/Progress Updates: group treatment    Therapeutic exercise performed with LE to increase strength for functional mobility: in sitting- bil alternating hip flexion, knee extension with ankle pumps at end range, bil hip adduction. Pelvic/trunk exercise in sitting- alternating wt shifting in order to "walk" bottom forward/backward in w/c, x 2.  Gait training with RW x 87' x 2, min assist except for approach to w/c requiring mod assist due to LOB R, due to narrow BOS, adduction RLE.  Focused on upright posture, forward gaze, wider stance and increasing fluidity of movement.  Condom catheter fell off during gait training and clothing became slightly wet; pt stated that he wanted to continue.  Nurse tech notified.    W/c propulsion using bil UEs x 30' , with supervision.  Therapy Documentation Precautions:  Precautions Precautions: Fall Restrictions Weight Bearing Restrictions: No Pain: Pain Assessment Pain Assessment: No/denies pain   Locomotion : Ambulation Ambulation/Gait Assistance: 3: Mod assist      See FIM for current functional status  Therapy/Group: Group Therapy  Kiersten Coss 02/10/2012, 5:11 PM

## 2012-02-11 LAB — GLUCOSE, CAPILLARY: Glucose-Capillary: 144 mg/dL — ABNORMAL HIGH (ref 70–99)

## 2012-02-11 MED ORDER — DEXTROSE 5 % IV SOLN
1.0000 g | INTRAVENOUS | Status: AC
  Administered 2012-02-11 – 2012-02-13 (×3): 1 g via INTRAVENOUS
  Filled 2012-02-11 (×3): qty 10

## 2012-02-11 NOTE — Progress Notes (Signed)
Speech Language Pathology Daily Session Note  Patient Details  Name: Evan [name] MRN: [id number] Date of Birth: 01/30/1932  Today's Date: 02/11/2012 Time: 1415-1500 Time Calculation (min): 45 min  Short Term Goals: Week 2: SLP Short Term Goal 1 (Week 2): Pt will utilize speech intelligibility strategies at the conversation level with supervision verbal cues and question cues.  SLP Short Term Goal 2 (Week 2): Pt will consume Dys. 3 textures and thin liquids without overt s/s of aspiration.  SLP Short Term Goal 3 (Week 2): Pt will utilize swallowing compensatory strategies with mod I.   Skilled Therapeutic Interventions: Treatment focus on speech intelligibility at the conversation level via telephone and face to face.  Pt was overall Mod I and utilized an increased vocal intensity and over articulation via telephone and was 100% intelligible, however, pt required supervision verbal cues to utilize increased vocal intensity face to face during functional conversation.    FIM:  Comprehension Comprehension Mode: Auditory Comprehension: 6-Follows complex conversation/direction: With extra time/assistive device Expression Expression Mode: Verbal Expression: 5-Expresses complex 90% of the time/cues < 10% of the time Social Interaction Social Interaction: 6-Interacts appropriately with others with medication or extra time (anti-anxiety, antidepressant). Problem Solving Problem Solving: 5-Solves complex 90% of the time/cues < 10% of the time Memory Memory: 6-More than reasonable amt of time FIM - Eating Eating Activity: 5: Supervision/cues  Pain Pain Assessment Pain Assessment: No/denies pain  Therapy/Group: Individual Therapy  [name] 02/11/2012, 3:47 PM

## 2012-02-11 NOTE — Progress Notes (Signed)
Patient ID: JAKYRON FABRO, male   DOB: March 05, 1932, 78 y.o.   MRN: [id number] Patient ID: MARSALIS BEAULIEU, male   DOB: [birth year], 78 y.o.   MRN: [id number]  Subjective/Complaints: Voiding more. better appetite  Other ros items negative  Objective: Vital Signs: Blood pressure 154/69, pulse 64, temperature 98 F (36.7 C), temperature source Oral, resp. rate 18, height 5' 10.87" (1.8 m), weight 78.6 kg (173 lb 4.5 oz), SpO2 99.00%. No results found. No results found for this basename: WBC:2,HGB:2,HCT:2,PLT:2 in the last 72 hours  Basename 02/09/12 1002  NA 136  K 4.7  CL 104  CO2 22  GLUCOSE 184*  BUN 34*  CREATININE 1.48*  CALCIUM 8.9   CBG (last 3)   Basename 02/11/12 1121 02/11/12 0702 02/10/12 2029  GLUCAP 222* 116* 183*    Wt Readings from Last 3 Encounters:  02/06/12 78.6 kg (173 lb 4.5 oz)  01/24/12 76 kg (167 lb 8.8 oz)  01/08/12 78.926 kg (174 lb)    Physical Exam:  General appearance: alert, cooperative and no distress Head: Normocephalic, without obvious abnormality, atraumatic Eyes: conjunctivae/corneas clear. PERRL, EOM's intact.  Ears: ext nl Nose: Nares normal. Septum midline. Mucosa normal. No drainage or sinus tenderness. Throat: lips, mucosa, and tongue normal; teeth and gums normal Neck: no adenopathy, no carotid bruit, no JVD, supple, symmetrical, trachea midline and thyroid not enlarged, symmetric, no tenderness/mass/nodules Back: symmetric, no curvature. ROM normal. No CVA tenderness. Resp: clear to auscultation bilaterally wheezes in left lung Cardio: regular rate and rhythm, S1, S2 normal, no murmur, click, rub or gallop GI: soft, non-tender; bowel sounds normal; no masses,  no organomegaly Extremities: extremities normal, atraumatic, no cyanosis or edema Pulses: 2+ and symmetric Skin: Skin color, texture, turgor normal. No rashes or lesions Neurologic: right side 3+ to 4 upper and 2-3 lower. Fair sitting balance.. Right central 7 and tongue  deviation, speech slightly dysarthric. No hiccups NW:GNFAO knee with mild crepitus, minimal joint line pain, no effusion. Heart murmur 2-3/6  Assessment/Plan: 1. Functional deficits secondary to left pontine infarct which require 3+ hours per day of interdisciplinary therapy in a comprehensive inpatient rehab setting. Physiatrist is providing close team supervision and 24 hour management of active medical problems listed below. Physiatrist and rehab team continue to assess barriers to discharge/monitor patient progress toward functional and medical goals.   FIM: FIM - Bathing Bathing Steps Patient Completed: Chest;Right Arm;Left Arm;Abdomen;Front perineal area;Right upper leg;Left upper leg Bathing:  (evening bath)  FIM - Upper Body Dressing/Undressing Upper body dressing/undressing steps patient completed: Thread/unthread right sleeve of pullover shirt/dresss;Thread/unthread left sleeve of pullover shirt/dress;Put head through opening of pull over shirt/dress;Pull shirt over trunk Upper body dressing/undressing: 5: Set-up assist to: Obtain clothing/put away FIM - Lower Body Dressing/Undressing Lower body dressing/undressing steps patient completed: Thread/unthread right pants leg;Thread/unthread left pants leg Lower body dressing/undressing: 2: Max-Patient completed 25-49% of tasks  FIM - Toileting Toileting steps completed by patient: Performs perineal hygiene Toileting Assistive Devices: Grab bar or rail for support Toileting: 1: Two helpers  FIM - Diplomatic Services operational officer Devices: Grab bars;Walker Toilet Transfers: 4-To toilet/BSC: Min A (steadying Pt. > 75%);4-From toilet/BSC: Min A (steadying Pt. > 75%)  FIM - Bed/Chair Transfer Bed/Chair Transfer Assistive Devices: Therapist, occupational: 4: Chair or W/C > Bed: Min A (steadying Pt. > 75%);4: Bed > Chair or W/C: Min A (steadying Pt. > 75%)  FIM - Locomotion: Wheelchair Distance: 40 Locomotion:  Wheelchair: 1: Total Assistance/staff pushes wheelchair (Pt<25%) FIM -  Locomotion: Ambulation Locomotion: Ambulation Assistive Devices: Designer, industrial/product Ambulation/Gait Assistance: 3: Mod assist Locomotion: Ambulation: 2: Travels 50 - 149 ft with minimal assistance (Pt.>75%)  Comprehension Comprehension Mode: Auditory Comprehension: 5-Follows basic conversation/direction: With extra time/assistive device  Expression Expression Mode: Verbal Expression: 4-Expresses basic 75 - 89% of the time/requires cueing 10 - 24% of the time. Needs helper to occlude trach/needs to repeat words.  Social Interaction Social Interaction: 6-Interacts appropriately with others with medication or extra time (anti-anxiety, antidepressant).  Problem Solving Problem Solving: 5-Solves complex 90% of the time/cues < 10% of the time  Memory Memory: 6-More than reasonable amt of time  Labs reviewed  A/P:  1. Left paracentral pontine infarct  2. DVT Prophylaxis/Anticoagulation: Subcutaneous Lovenox. Monitor platelet counts and any signs of bleeding  3. diabetes mellitus. Hemoglobin A1c 5.8. Presently with sliding scale insulin. Patient on Glucotrol 5 mg twice a day prior to admission. Glucotrol resumed at 5mg  qam with fair control thus far. 4. Neuropsych: This patient is capable of making decisions on his/her own behalf.  5. History of renal transplant March of 2012. Continue immunosuppressants medicines as per renal services  6. GERD. Protonix  7. History of prostate cancer. PTA Bactrim 3 times weekly for bladder prophylaxis.  -bactrim held  -ecoli uti- keflex completed. See below  -urinary retention better.. Still with occasional incontinence 8. Hiccups: low dose baclofen tid PRN. Hiccups have stopped 9. Right knee instability: mild OA likely with associated muscle weakness  -consider knee sleeve if persistent- therapy doesn't feel knee brace will be enough to realistically stabilize the knee  Voltaren  gel, labs 10. Citrobacter UTI. Resistant to cipro. Will give 3 days of CTX. 11. Poor appetite. Started on Megace with good results     LOS (Days) 14 A FACE TO FACE EVALUATION WAS PERFORMED  Toriann Spadoni T 02/11/2012, 2:30 PM

## 2012-02-11 NOTE — Progress Notes (Signed)
Social Work  atient ID: Evan [name], male   DOB: [birth year], 78 y.o.   MRN: [id number]   Met this afternoon with patient and daughter to give update on search for SNF bed.  Pt's info has been sent out to area facilities, however, no offers received yet.  Pt appears much brighter today with speech louder and clearer.  He is smiling throughout discussion.  He did have some questions about what therapy he might have following SNF and we discussed HH vs OP.  Pt and family continue to be agreeable with plan for SNF. Will keep team posted as progress is made on this plan.

## 2012-02-11 NOTE — Progress Notes (Signed)
Speech Language Pathology Daily Session Note [facility]  Patient Details  Name: Evan [name] MRN: [id number] Date of Birth: [birth year]  Today's Date: 02/11/2012 Time: 1200-1220 Time Calculation (min): 20 min  Short Term Goals: Week 2: SLP Short Term Goal 1 (Week 2): Pt will utilize speech intelligibility strategies at the conversation level with supervision verbal cues and question cues.  SLP Short Term Goal 2 (Week 2): Pt will consume Dys. 3 textures and thin liquids without overt s/s of aspiration.  SLP Short Term Goal 3 (Week 2): Pt will utilize swallowing compensatory strategies with mod I.   Skilled Therapeutic Interventions: Pt seen in [facility] with focus on utilization of swallowing compensatory strategies. Pt  demonstrated cough X 1 due to consuming liquids and masticating textures at the same time.  Pt required Min verbal and question cues to utilize. Pt 100% intelligible and demonstrated efficient vocal intensity for functional communication.    FIM:  FIM - Eating Eating Activity: 5: Supervision/cues  Pain Pain Assessment Pain Assessment: No/denies pain  Therapy/Group: Dysphagia Group  Vesper Trant 02/11/2012, 1:25 PM

## 2012-02-11 NOTE — Progress Notes (Signed)
Occupational Therapy Note  Patient Details  Name: Evan [name] MRN: [id number] Date of Birth: [birth year] Today's Date: 02/11/2012  Time: 1130-1200 Pt denies pain Group Therapy Pt participated in self-feeding group with focus on BUE use for opening containers and following swallowing strategies.  Pt exhibited difficulty with opening condiment containers and utilized alternative strategy (opening container with teeth).  Pt exhibited one episode of coughing while drinking ice tea with food in mouth.  Pt verbalized afterwards the reason he coughed.   Lavone Neri [facility] 02/11/2012, 1:10 PM

## 2012-02-11 NOTE — Progress Notes (Signed)
Physical Therapy Note  Patient Details  Name: Evan [name] MRN: [id number] Date of Birth: 06-01-1932 Today's Date: 02/11/2012  Time: 1015-1057 42 minutes  No c/o pain.  Gait training in controlled environment with focus on increasing step length and foot clearance with min cues, min A.  Gait training with obstacle negotiation with increased cuing need for R LE clearance and step length, mod A for 1 LOB due to R knee buckling.  Ramp/curb training with RW, min A, cues for safety and technique.  Pt with less episodes of R knee buckling this session but continues to require mod A for LOB when knee does buckle.  Kinetron for LE strength and endurance x 6 minutes.  Individual therapy   Druanne Bosques 02/11/2012, 10:58 AM

## 2012-02-11 NOTE — Progress Notes (Signed)
Occupational Therapy Session Note  Patient Details  Name: LEELAN RAJEWSKI MRN: [id number] Date of Birth: [birth year]  Today's Date: 02/11/2012 Time: 0800-0915 Time Calculation (min): 75 min  Short Term Goals: Week 2:   =LTG  Skilled Therapeutic Interventions/Progress Updates:   Pt engaged in ADL retraining including bathing and dressing w/c level at sink.  Pt declined shower this morning and requested only to wash face and under arms.  Pt receives evening bath regularly.  Focus on dressing tasks and standing activities at sink.  Pt stated he felt refreshed this morning.  Pt continues to require extra time to complete all tasks.  Pt performed sit<>stand with steady assist and required steady assist while patient initiated pulling up pants.  Pt stood approx 2 mins for cleaning perineal area and donning brief.   Therapy Documentation Precautions:  Precautions Precautions: Fall Restrictions Weight Bearing Restrictions: No   Pain: Pain Assessment Pain Assessment: No/denies pain  See FIM for current functional status  Therapy/Group: Individual Therapy  Rich Brave 02/11/2012, 11:18 AM

## 2012-02-12 LAB — GLUCOSE, CAPILLARY: Glucose-Capillary: 199 mg/dL — ABNORMAL HIGH (ref 70–99)

## 2012-02-12 MED ORDER — BIOTENE DRY MOUTH MT LIQD: 15.0000 mL | OROMUCOSAL | Status: DC | PRN

## 2012-02-12 NOTE — Progress Notes (Signed)
Physical Therapy Weekly Progress Note  Patient Details  Name: Evan [name] MRN: [id number] Date of Birth: 1932-07-02  Today's Date: 02/12/2012  Patient has met 3 of 3 short term goals.  Pt has improved mobility and activity tolerance as well as balance and is able to perform functional mobility, transfers and gait at a min A level.  Patient continues to demonstrate the following deficits: decreased strength/endurance in R knee causing occasional buckling requiring assist to correct, decreased activity tolerance, impaired balance reactions, impaired dynamic gait and therefore will continue to benefit from skilled PT intervention to enhance overall performance with activity tolerance, balance and ability to compensate for deficits.  Patient progressing toward long term goals..  Continue plan of care.  PT Short Term Goals Week 2:  PT Short Term Goal 1 (Week 2): Pt will gait 50' with min A in controlled environment PT Short Term Goal 1 - Progress (Week 2): Met PT Short Term Goal 2 (Week 2): Pt will consistently perform sit to stand transfers with min A PT Short Term Goal 2 - Progress (Week 2): Met PT Short Term Goal 3 (Week 2): Pt will demo standing dynamic balance for functional task with min A for 2 minutes PT Short Term Goal 3 - Progress (Week 2): Met  Skilled Therapeutic Interventions/Progress Updates:  Ambulation/gait training;Balance/vestibular training;Discharge planning;DME/adaptive equipment instruction;Neuromuscular re-education;Pain management;Patient/family education;Functional mobility training;Therapeutic Activities;UE/LE Coordination activities;Wheelchair propulsion/positioning;Stair training;UE/LE Strength taining/ROM;Therapeutic Exercise    See FIM for current functional status    Evan [name] 02/12/2012, 2:53 PM

## 2012-02-12 NOTE — Progress Notes (Signed)
Occupational Therapy Note  Patient Details  Name: Evan [name] MRN: [id number] Date of Birth: June 17, 1932 Today's Date: 02/12/2012  Time: 1130-1145 Pt denies pain Group Therapy Pt participated in self feeding group with focus on BUE use for opening containers and following swallowing strategies.  Pt continues to require verbal cues for swallowing strategies.  Pt using BUE for opening all containers without assistance.   Lavone Neri [facility] 02/12/2012, 3:07 PM

## 2012-02-12 NOTE — Progress Notes (Signed)
Speech Language Pathology Daily Session Note [facility]  Patient Details  Name: Evan [name] MRN: [id number] Date of Birth: 1931/08/13  Today's Date: 02/12/2012 Time: 3086-5784 Time Calculation (min): 30 min  Short Term Goals: Week 2: SLP Short Term Goal 1 (Week 2): Pt will utilize speech intelligibility strategies at the conversation level with supervision verbal cues and question cues.  SLP Short Term Goal 2 (Week 2): Pt will consume Dys. 3 textures and thin liquids without overt s/s of aspiration.  SLP Short Term Goal 3 (Week 2): Pt will utilize swallowing compensatory strategies with mod I.   Skilled Therapeutic Interventions: Pt seen in [employer] with focus on utilization of swallowing compensatory strategies. Pt did not demonstrate any overt s/s of aspiration and required supervision semantic cues to utilize swallowing compensatory strategies. Pt 100% intelligible and demonstrated efficient vocal intensity for functional communication with other group members.    FIM:  Comprehension Comprehension Mode: Auditory Comprehension: 6-Follows complex conversation/direction: With extra time/assistive device Expression Expression Mode: Verbal Expression: 5-Expresses complex 90% of the time/cues < 10% of the time Social Interaction Social Interaction: 6-Interacts appropriately with others with medication or extra time (anti-anxiety, antidepressant). Problem Solving Problem Solving: 6-Solves complex problems: With extra time Memory Memory: 6-More than reasonable amt of time FIM - Eating Eating Activity: 5: Supervision/cues  Pain Pain Assessment Pain Assessment: No/denies pain  Therapy/Group: Dysphagia Group  Teren Zurcher 02/12/2012, 2:34 PM

## 2012-02-12 NOTE — Progress Notes (Signed)
Subjective/Complaints: Occasional hiccups. Knee continues to be a bit of a problem. Other ros items negative  Objective: Vital Signs: Blood pressure 161/67, pulse 69, temperature 98.2 F (36.8 C), temperature source Oral, resp. rate 18, height 5' 10.87" (1.8 m), weight 78.6 kg (173 lb 4.5 oz), SpO2 100.00%. No results found. No results found for this basename: WBC:2,HGB:2,HCT:2,PLT:2 in the last 72 hours  Basename 02/09/12 1002  NA 136  K 4.7  CL 104  CO2 22  GLUCOSE 184*  BUN 34*  CREATININE 1.48*  CALCIUM 8.9   CBG (last 3)   Basename 02/11/12 2112 02/11/12 1645 02/11/12 1121  GLUCAP 269* 144* 222*    Wt Readings from Last 3 Encounters:  02/06/12 78.6 kg (173 lb 4.5 oz)  01/24/12 76 kg (167 lb 8.8 oz)  01/08/12 78.926 kg (174 lb)    Physical Exam:  General appearance: alert, cooperative and no distress Head: Normocephalic, without obvious abnormality, atraumatic Eyes: conjunctivae/corneas clear. PERRL, EOM's intact.  Ears: ext nl Nose: Nares normal. Septum midline. Mucosa normal. No drainage or sinus tenderness. Throat: lips, mucosa, and tongue normal; teeth and gums normal Neck: no adenopathy, no carotid bruit, no JVD, supple, symmetrical, trachea midline and thyroid not enlarged, symmetric, no tenderness/mass/nodules Back: symmetric, no curvature. ROM normal. No CVA tenderness. Resp: clear to auscultation bilaterally wheezes in left lung Cardio: regular rate and rhythm, S1, S2 normal, no murmur, click, rub or gallop GI: soft, non-tender; bowel sounds normal; no masses,  no organomegaly Extremities: extremities normal, atraumatic, no cyanosis or edema Pulses: 2+ and symmetric Skin: Skin color, texture, turgor normal. No rashes or lesions Neurologic: right side 3+ to 4 upper and 2-3 lower. Fair sitting balance.. Right central 7 and tongue deviation, speech slightly dysarthric. occasional hiccups ZO:XWRUE knee with mild crepitus, minimal joint line pain, no  effusion. Heart murmur 2-3/6  Assessment/Plan: 1. Functional deficits secondary to left pontine infarct which require 3+ hours per day of interdisciplinary therapy in a comprehensive inpatient rehab setting. Physiatrist is providing close team supervision and 24 hour management of active medical problems listed below. Physiatrist and rehab team continue to assess barriers to discharge/monitor patient progress toward functional and medical goals.   FIM: FIM - Bathing Bathing Steps Patient Completed: Chest;Right Arm;Left Arm;Abdomen;Front perineal area;Right upper leg;Left upper leg Bathing:  (evening bath)  FIM - Upper Body Dressing/Undressing Upper body dressing/undressing steps patient completed: Thread/unthread right sleeve of pullover shirt/dresss;Thread/unthread left sleeve of pullover shirt/dress;Put head through opening of pull over shirt/dress;Pull shirt over trunk Upper body dressing/undressing: 5: Set-up assist to: Obtain clothing/put away FIM - Lower Body Dressing/Undressing Lower body dressing/undressing steps patient completed: Thread/unthread right pants leg;Thread/unthread left pants leg Lower body dressing/undressing: 2: Max-Patient completed 25-49% of tasks  FIM - Toileting Toileting steps completed by patient: Adjust clothing prior to toileting Toileting Assistive Devices: Grab bar or rail for support Toileting: 1: Two helpers  FIM - Diplomatic Services operational officer Devices: Grab bars;Walker Toilet Transfers: 4-To toilet/BSC: Min A (steadying Pt. > 75%);4-From toilet/BSC: Min A (steadying Pt. > 75%)  FIM - Bed/Chair Transfer Bed/Chair Transfer Assistive Devices: Therapist, occupational: 4: Chair or W/C > Bed: Min A (steadying Pt. > 75%);4: Bed > Chair or W/C: Min A (steadying Pt. > 75%)  FIM - Locomotion: Wheelchair Distance: 40 Locomotion: Wheelchair: 1: Total Assistance/staff pushes wheelchair (Pt<25%) FIM - Locomotion: Ambulation Locomotion:  Ambulation Assistive Devices: Designer, industrial/product Ambulation/Gait Assistance: 3: Mod assist Locomotion: Ambulation: 2: Travels 50 - 149 ft with minimal  assistance (Pt.>75%)  Comprehension Comprehension Mode: Auditory Comprehension: 6-Follows complex conversation/direction: With extra time/assistive device  Expression Expression Mode: Verbal Expression: 5-Expresses complex 90% of the time/cues < 10% of the time  Social Interaction Social Interaction: 6-Interacts appropriately with others with medication or extra time (anti-anxiety, antidepressant).  Problem Solving Problem Solving: 5-Solves basic 90% of the time/requires cueing < 10% of the time  Memory Memory: 6-More than reasonable amt of time   A/P:  1. Left paracentral pontine infarct  2. DVT Prophylaxis/Anticoagulation: Subcutaneous Lovenox. Monitor platelet counts and any signs of bleeding  3. diabetes mellitus. Hemoglobin A1c 5.8. Presently with sliding scale insulin. Patient on Glucotrol 5 mg twice a day prior to admission. Glucotrol resumed at 5mg  qam with fair control thus far. May need further titration if sugars remain elevated. 4. Neuropsych: This patient is capable of making decisions on his/her own behalf.  5. History of renal transplant March of 2012. Continue immunosuppressants medicines as per renal services  6. GERD. Protonix  7. History of prostate cancer. PTA Bactrim 3 times weekly for bladder prophylaxis.  -bactrim held  -urinary retention better.. Still with occasional incontinence 8. Hiccups: low dose baclofen tid PRN. Hiccups have stopped 9. Right knee instability: mild OA likely with associated muscle weakness  -consider knee sleeve if persistent- therapy doesn't feel knee brace will be enough to realistically stabilize the knee  Voltaren gel, labs 10. Citrobacter UTI. Resistant to cipro. Will give 3 days of CTX. Thru 7/10 11. Poor appetite. Started on Megace with good results     LOS (Days) 15 A FACE  TO FACE EVALUATION WAS PERFORMED  Evan [name] T 02/12/2012, 8:20 AM

## 2012-02-12 NOTE — Progress Notes (Signed)
Nutrition Follow-up  Intervention:    Continue Glucerna Shake po daily, each supplement provides 220 kcal and 10 grams of protein  Assessment:   Pt and daughter report that appetite is improved on Megace. Pt enjoys drinking Glucerna Shake. Pt does complain that there is often missing items from his meal trays. Will notify Patient services manager to assist.   Diet Order:  Dysphagia 3, Thin liquids Meal completion 100% on Megace 400 ml BID Last BM: 02/12/12 on Miralax daily  Meds: Scheduled Meds:   . calcium carbonate  1 tablet Oral TID WC  . cefTRIAXone (ROCEPHIN)  IV  1 g Intravenous Q24H  . clopidogrel  75 mg Oral Q breakfast  . enoxaparin  40 mg Subcutaneous Q24H  . feeding supplement  237 mL Oral Q24H  . insulin aspart  0-15 Units Subcutaneous TID WC  . loratadine  10 mg Oral Daily  . megestrol  400 mg Oral BID  . mycophenolate  180 mg Oral BID  . omeprazole  40 mg Oral BID  . polyethylene glycol  17 g Oral Daily  . tacrolimus  5 mg Oral BID  . DISCONTD: ciprofloxacin  500 mg Oral BID   Continuous Infusions:  PRN Meds:.acetaminophen, baclofen, diclofenac sodium, ondansetron (ZOFRAN) IV, ondansetron, polyethylene glycol, sorbitol  Labs:  CMP     Component Value Date/Time   NA 136 02/09/2012 1002   K 4.7 02/09/2012 1002   CL 104 02/09/2012 1002   CO2 22 02/09/2012 1002   GLUCOSE 184* 02/09/2012 1002   BUN 34* 02/09/2012 1002   CREATININE 1.48* 02/09/2012 1002   CALCIUM 8.9 02/09/2012 1002   CALCIUM 9.5 12/25/2011 1005   PROT 6.1 01/29/2012 0630   ALBUMIN 3.3* 01/29/2012 0630   AST 29 01/29/2012 0630   ALT 38 01/29/2012 0630   ALKPHOS 45 01/29/2012 0630   BILITOT 0.4 01/29/2012 0630   GFRNONAA 44* 02/09/2012 1002   GFRAA 51* 02/09/2012 1002   CBG (last 3)   Basename 02/11/12 2112 02/11/12 1645 02/11/12 1121  GLUCAP 269* 144* 222*    Intake/Output Summary (Last 24 hours) at 02/12/12 1418 Last data filed at 02/12/12 0800  Gross per 24 hour  Intake    600 ml  Output      0 ml  Net     600 ml    Weight Status:  (Usual weight 175  -180 lbs) 173 lbs - stable  Re-estimated needs:  Kcal: 1700 - 1900, Protein: 65 - 80 grams, Fluid: 1.8 - 2 liters daily  Nutrition Dx:  Increased nutrient needs r/t increased energy expenditure with participation of therapies AEB estimated needs.   Goal: Pt to meet >/= 90% of their estimated nutrition needs; met  Monitor:  PO intake, supplement intake, weights  Kendell Bane RD, LDN, CNSC [phone] Pager [phone] After Hours Pager

## 2012-02-12 NOTE — Progress Notes (Signed)
Occupational Therapy Session Note  Patient Details  Name: Evan [name] MRN: [id number] Date of Birth: October 13, 1931  Today's Date: 02/12/2012 Time: 0800-0915 Time Calculation (min): 75 min  Short Term Goals: Week 2:   =LTG  Skilled Therapeutic Interventions/Progress Updates:    Pt declined shower this morning opting to bathe LB while seated on BSC and bathe UB seated in w/c at sink.  Pt performed sit<>stands with supervision this morning and steady A while standing to pull up pants.  Pt amb with RW from bathroom to w/c at sink with steady A.  Pt continues to require extra time to complete tasks but is exhibiting increased independence with LB dressing tasks.  Therapy Documentation Precautions:  Precautions Precautions: Fall Restrictions Weight Bearing Restrictions: No   Pain: Pain Assessment Pain Assessment: No/denies pain  See FIM for current functional status  Therapy/Group: Individual Therapy  Rich Brave 02/12/2012, 1:06 PM

## 2012-02-12 NOTE — Progress Notes (Signed)
Physical Therapy Note  Patient Details  Name: Evan [name] MRN: [id number] Date of Birth: [birth year] Today's Date: 02/12/2012  Time: [phone] 57 minutes  No c/o pain.  Gait training in controlled environment 110' x 2 with pt performing increased step length without cuing!  Stair training 2 x 5 stairs with B handrails with min A, cues for sequencing.  Standing ball toss without UE support with min A for LOB with R knee buckling, pt able to tolerate standing activity 2 x 3.5 minutes.  Supine <> sit training with supervision, improved since last week.  Supine TE for core and LE strengthening, bridging, LTR, SLR, bridging with hip shifts. Pt with improved activity tolerance today.  Individual therapy   Sula Fetterly 02/12/2012, 12:27 PM

## 2012-02-12 NOTE — Discharge Summary (Signed)
Evan [name], ASCHOFF NO.:  [id number]  MEDICAL RECORD NO.:  [id number]  LOCATION:  4142                         FACILITY:  [facility]  PHYSICIAN:  Ranelle Oyster, M.D.DATE OF BIRTH:  Mar 14, 1932  DATE OF ADMISSION:  01/28/2012 DATE OF DISCHARGE:02/14/12                              DISCHARGE SUMMARY   DISCHARGE DIAGNOSES: 1. Left paracentral pontine infarction. 2. Subcutaneous Lovenox for deep venous thrombosis prophylaxis. 3. Diabetes mellitus. 4. History of renal transplant March 2012. 5. Gastroesophageal reflux disease. 6. History of prostate cancer. 7. Citrobacter urinary tract infection. 8. Right knee instability with mild osteoarthritis. 9. Decreased nutritional storage. This is a 78 year old male with history of diabetes mellitus, end-stage renal disease with renal transplant at [facility] in March, 2012, admitted January 24, 2012, with slurred speech, bilateral lower extremity weakness x1 day.  The patient independent with cane prior to admission. MRI showed nonhemorrhagic acute left paracentral pontine infarct as well as remote small right paracentral pontine infarct.  Echocardiogram with ejection fraction 65% with grade 2 diastolic dysfunction.  Carotid Dopplers with bilateral 40-59% ICA stenosis.  The patient did not receive tPA.  Renal Services followup, baseline creatinine 1.5, advised to continue usual transplant medications.  Neurology consulted. Maintain on Plavix therapy.  Subcutaneous Lovenox added for DVT prophylaxis.  The patient was admitted for comprehensive rehab program.  PAST MEDICAL HISTORY:  See discharge diagnoses.  SOCIAL HISTORY:  Independent prior to admission.  Functional status upon admission to rehab services was moderate assist for functional mobility.  PHYSICAL EXAMINATION:  VITAL SIGNS:  Blood pressure 178/74, pulse 78, respirations 18, temperature 98.2. GENERAL:  This was a well-developed, alert male. HEENT:   Normocephalic. LUNGS:  Clear to auscultation. CARDIAC:  Regular rate and rhythm. ABDOMEN:  Soft, nontender.  Good bowel sounds. NEUROLOGIC:  Speech was dysarthric, but intelligible.  Noted left facial droop.  He names person, place, date of birth, and follows simple commands.  REHABILITATION HOSPITAL COURSE:  The patient was admitted to inpatient rehab services with therapies initiated on a 3-hour daily basis consisting of physical therapy, occupational therapy, speech therapy, and rehabilitation nursing.  The following issues were addressed during the patient's rehabilitation stay.  Pertaining to Mr. Kern Alberta left pontine infarction remained stable, maintained on Plavix therapy. Subcutaneous Lovenox ongoing for deep vein thrombosis prophylaxis with no bleeding episodes.  The patient did have a history of diabetes mellitus with hemoglobin A1c of 5.8.  He did have a history of renal transplant, followed by Renal Services.  He continued on immunosuppressants as prior to hospital admission.  He did have a history of gastroesophageal reflux disease.  He was maintained on Prilosec.  Noted history of prostate cancer with Citrobacter urinary tract infection.  He was on prophylactic Bactrim 3 times weekly for bladder prophylaxis.  He did complete a 3-day course of Rocephin prior to discharge on February 13, 2012.  The patient received weekly collaborative interdisciplinary team conferences to discuss estimated length of stay, family teaching, and any barriers to his discharge.  He was continent of bowel.  Occasional incontinence of bladder with routine toileting.  He was maintained on a mechanical soft diet with thin liquids.  He  was moderate to max assist for activities of daily living, minimum to moderate assist for mobility, however, this was variable.  A bed alarm remained in place for the patient's safety.  The patient with slow, but progressive gains during his rehab stay.  It was felt by  the staff and family the skilled nursing facility was needed, and bed becoming available February 13, 2012.  DISCHARGE MEDICATIONS: 1. Tylenol 650 mg p.o. every 4 hours as needed pain or fever. 2. Tums tablets 1 p.o. t.i.d. 3. Plavix 75 mg p.o. daily. 4. Claritin 10 mg p.o. daily. 5. Myfortic 180 mg p.o. b.i.d. 6. Prilosec 40 mg b.i.d. 7. MiraLAX 17 g daily with 8 ounces of water, hold for loose stools. 8. Prograf 5 mg p.o. b.i.d.  DIET:  His diet was a mechanical, soft, 1800 calorie ADA.  SPECIAL INSTRUCTIONS:  Continue to monitor blood sugars.  The patient could resume his Bactrim as prior to hospital admission, 400-80 mg 1 tablet every Monday, Wednesday, and Friday.  If blood sugars were continued to elevate, you could resume his Glucotrol 5 mg b.i.d.  The patient would follow up with Dr. Faith Rogue at the outpatient rehab service office as directed, Dr. Margaretmary Bayley, medical management.  Discharge date, February 13, 2012, to skilled nursing facility.     Mariam Dollar, P.A.   ______________________________ Ranelle Oyster, M.D.    DA/MEDQ  D:  02/12/2012  T:  02/12/2012  Job:  [id number]  cc:   Margaretmary Bayley, M.D. Maree Krabbe, M.D. Thana Farr, MD

## 2012-02-12 NOTE — Progress Notes (Signed)
Inpatient Diabetes Program Recommendations  AACE/ADA: New Consensus Statement on Inpatient Glycemic Control (2009)  Target Ranges:  Prepandial:   less than 140 mg/dL      Peak postprandial:   less than 180 mg/dL (1-2 hours)      Critically ill patients:  140 - 180 mg/dL   Reason for Visit: Hyperglycemia  Inpatient Diabetes Program Recommendations Correction (SSI): Please decrease to sensitive correction tidwc and add meal coverage per below. Insulin - Meal Coverage: Pt now eating 100% (from 25-50%).  Please add 3 units novolog meal coverage and decrease correction to sensitive tidwc.  Would rather use Novolog meal coverage rather than glipizide due to variable po intake.  Glipizide lasts too long throughout the day.  Note: Thank you, Lenor Coffin, RN, CNS, Diabetes Coordinator [phone])

## 2012-02-12 NOTE — Discharge Summary (Signed)
  Discharge summary job [id number]

## 2012-02-12 NOTE — Patient Care Conference (Signed)
Inpatient RehabilitationTeam Conference Note Date: 02/12/2012   Time: 3:20 PM    Patient Name: Evan [name]      Medical Record Number: [id number]  Date of Birth: 02-06-1932 Sex: Male         Room/Bed: 4142/4142-01 Payor Info: Payor: MEDICARE  Plan: MEDICARE PART A AND B  Product Type: *No Product type*     Admitting Diagnosis: LT CVA  Admit Date/Time:  01/28/2012  3:49 PM Admission Comments: No comment available   Primary Diagnosis:  CVA (cerebral infarction) Principal Problem: CVA (cerebral infarction)  Patient Active Problem List   Diagnosis Date Noted  . Aortic stenosis 01/28/2012  . CVA (cerebral infarction) 01/28/2012  . Diabetes mellitus type II 01/24/2012  . CVA (cerebral vascular accident) 01/24/2012  . History of renal transplant 01/24/2012  . H/O: duodenal ulcer 01/24/2012  . H/O atrial fibrillation without current medication 01/24/2012  . CA prostate, adenoca 01/24/2012    Expected Discharge Date: Expected Discharge Date: 02/13/12  Team Members Present: Physician: Dr. Faith Rogue Case Manager Present: Lutricia Horsfall, RN Social Worker Present: Amada Jupiter, LCSW Nurse Present: Carlean Purl, RN PT Present: Reggy Eye, PT OT Present: Roney Mans, OT;Ardis Rowan, COTA SLP Present: Feliberto Gottron, SLP     Current Status/Progress Goal Weekly Team Focus  Medical   urinary tract infection. rx changed to ceftriaxone, occasional hiccups  see prior, knee instability  see prior   Bowel/Bladder   cont of bowel, can be incont. of bladder at time but with assist and time toileting pt can be continent      time toileting q2-3 hr   Swallow/Nutrition/ Hydration   Dys. 3 textures and thin liquids, supervision verbal cues  Mod I  utilization of swallowing compensatory strategies   ADL's   bathing - min A/mod A; UB dressing - supervision; LB dressing - max A; transfers - min A/mod A; inconsistant   bathing/dressing - min A; toilet transfers - min A; toileting  - mod A  activity tolerance, RUE use, standing tolerance and balance    Mobility   min A  min A  strength, activity tolerance   Communication   Supervision-Mod I  Mod I  Utilization of speech intelligibility strategies   Safety/Cognition/ Behavioral Observations            Pain   no complaints   < 3  continue to assess   Skin   intact  no breakdown  continue to assess      *See Interdisciplinary Assessment and Plan and progress notes for long and short-term goals  Barriers to Discharge: right knee instability    Possible Resolutions to Barriers:  supervision    Discharge Planning/Teaching Needs:    Transfer to SNF tomorrow     Team Discussion: Pt to d/c to SNF tomorrow at min assist overall. SLP reports pt c/o dry mouth, Biotene ordered.    Revisions to Treatment Plan:     Continued Need for Acute Rehabilitation Level of Care: The patient requires daily medical management by a physician with specialized training in physical medicine and rehabilitation for the following conditions: Daily direction of a multidisciplinary physical rehabilitation program to ensure safe treatment while eliciting the highest outcome that is of practical value to the patient.: Yes Daily medical management of patient stability for increased activity during participation in an intensive rehabilitation regime.: Yes Daily analysis of laboratory values and/or radiology reports with any subsequent need for medication adjustment of medical intervention for : Neurological problems;Other  Evan [name] 02/12/2012, 3:54 PM

## 2012-02-12 NOTE — Progress Notes (Signed)
Speech Language Pathology Daily Session Note  Patient Details  Name: Evan [name] MRN: [id number] Date of Birth: 07/18/1932  Today's Date: 02/12/2012 Time: 1335-1405 Time Calculation (min): 30 min  Short Term Goals: Week 2: SLP Short Term Goal 1 (Week 2): Pt will utilize speech intelligibility strategies at the conversation level with supervision verbal cues and question cues.  SLP Short Term Goal 2 (Week 2): Pt will consume Dys. 3 textures and thin liquids without overt s/s of aspiration.  SLP Short Term Goal 3 (Week 2): Pt will utilize swallowing compensatory strategies with mod I.   Skilled Therapeutic Interventions: Treatment focus on utilization of speech intelligibility strategies at the conversation level.  Pt's daughter present throughout session and reported she is pleased with pt's progress and overall speech intelligibility. Pt was 100% intelligible throughout functional conversation and demonstrated appropriate use of speech indelibility strategies.    FIM:  Comprehension Comprehension Mode: Auditory Comprehension: 6-Follows complex conversation/direction: With extra time/assistive device Expression Expression Mode: Verbal Expression: 5-Expresses complex 90% of the time/cues < 10% of the time Social Interaction Social Interaction: 6-Interacts appropriately with others with medication or extra time (anti-anxiety, antidepressant). Problem Solving Problem Solving: 6-Solves complex problems: With extra time Memory Memory: 6-More than reasonable amt of time FIM - Eating Eating Activity: 5: Supervision/cues  Pain Pain Assessment Pain Assessment: No/denies pain  Therapy/Group: Individual Therapy  Lasheka Kempner 02/12/2012, 2:39 PM

## 2012-02-13 DIAGNOSIS — Z5189 Encounter for other specified aftercare: Secondary | ICD-10-CM

## 2012-02-13 DIAGNOSIS — I6330 Cerebral infarction due to thrombosis of unspecified cerebral artery: Secondary | ICD-10-CM

## 2012-02-13 DIAGNOSIS — N186 End stage renal disease: Secondary | ICD-10-CM

## 2012-02-13 DIAGNOSIS — N390 Urinary tract infection, site not specified: Secondary | ICD-10-CM

## 2012-02-13 LAB — GLUCOSE, CAPILLARY
Glucose-Capillary: 199 mg/dL — ABNORMAL HIGH (ref 70–99)
Glucose-Capillary: 228 mg/dL — ABNORMAL HIGH (ref 70–99)
Glucose-Capillary: 257 mg/dL — ABNORMAL HIGH (ref 70–99)

## 2012-02-13 MED ORDER — GLIPIZIDE 5 MG PO TABS
5.0000 mg | ORAL_TABLET | Freq: Every day | ORAL | Status: DC
  Administered 2012-02-13 – 2012-02-14 (×2): 5 mg via ORAL
  Filled 2012-02-13 (×3): qty 1

## 2012-02-13 NOTE — Progress Notes (Signed)
Social Work Patient ID: Evan [name], male   DOB: 04-05-1932, 78 y.o.   MRN: [id number]  Have spoken several times with patient, wife and daughter over past two days regarding SNF bed offers received.  Explained to them yesterday that we had two offers for pt and that he could transfer today, however, pt and family feel this is "too fast" and they "...want to talk about it more today..." Explained to all that I cannot guarantee that facilities will still have these beds available if we do not accept and move today.  Wife very adamant about not being "ready" yet to make decision.  Because pt is continuing to progress with therapies and was still on therapy schedule today, I informed the family that we could wait until tomorrow for move, but that was the longest we could delay.  Pt and family are to get in touch with me today to let me know which bed offer they will take.  Kayna Suppa

## 2012-02-13 NOTE — Progress Notes (Signed)
Speech Language Pathology Daily Session & Discharge Summary  Patient Details  Name: Evan [name] MRN: [id number] Date of Birth: 1932-04-08  Today's Date: 02/13/2012 Time: 1335-1400 Time Calculation (min): 25 min  Skilled Therapeutic Intervention: Treatment focus on utilization of speech intelligibility strategies of increased vocal intensity and over-articulation at the conversation level. Pt overall Mod I throughout session and demonstrated anticipatory awareness for discharge planning.   Patient has met 2 of 2 long term goals.  Patient to discharge at overall Modified Independent level.   Reasons goals not met: N/A   Clinical Impression/Discharge Summary: Pt has made functional gains and has met all LTG's this admission. Currently, pt is tolerating regular textures and thin liquids and is overall Mod I for utilization of swallowing compensatory strategies. Pt is also Mod I for utilization of speech intelligibility strategies and is 100% intelligible at the conversation level.  Pt's wife unable to provide the necessary physical assistance will discharge to a skilled nursing facility and will receive follow-up SLP intervention to maximize speech intelligibly and overall swallowing safety.    Recommendation:  Skilled Nursing facility  Rationale for SLP Follow Up: Maximize swallowing safety;Maximize functional communication   Equipment: N/A   Reasons for discharge: Treatment goals met;Discharged from hospital   Patient/Family Agrees with Progress Made and Goals Achieved: Yes   See FIM for current functional status  [name] 02/13/2012, 3:10 PM

## 2012-02-13 NOTE — Progress Notes (Signed)
Physical Therapy Note  Patient Details  Name: Evan [name] MRN: [id number] Date of Birth: [birth year] Today's Date: 02/13/2012  Time: 1030-1113 43 minutes  No c/o pain.  Pt performed gait in controlled environment with min A with RW 150' x 2 with min cuing for increased step length, pt continues to shuffle feet when fatigued.  Gait with obstacle negotiation, stepping over obstacles with min A, min cues for safety.  Stair training 5 stairs with B handrails with min A, cues for technique.  Sit to stand repetition with focus on quick movements and gaining balance without UE support in standing, pt required min A for standing balance without UE support, improved balance reactions noted, continues with slow/delayed reactions.  Activity tolerance improving.  Individual therapy   [name] 02/13/2012, 11:15 AM

## 2012-02-13 NOTE — Progress Notes (Addendum)
Patient ID: Evan [name], male   DOB: 1932-07-13, 78 y.o.   MRN: [id number]  Subjective/Complaints: No new complaints. Other ros items negative  Objective: Vital Signs: Blood pressure 163/64, pulse 67, temperature 98 F (36.7 C), temperature source Oral, resp. rate 18, height 5' 10.87" (1.8 m), weight 78.6 kg (173 lb 4.5 oz), SpO2 99.00%. No results found. No results found for this basename: WBC:2,HGB:2,HCT:2,PLT:2 in the last 72 hours No results found for this basename: NA:2,K:2,CL:2,CO2:2,GLUCOSE:2,BUN:2,CREATININE:2,CALCIUM:2 in the last 72 hours CBG (last 3)   Basename 02/12/12 2040 02/12/12 1628 02/12/12 1112  GLUCAP 271* 191* 233*    Wt Readings from Last 3 Encounters:  02/06/12 78.6 kg (173 lb 4.5 oz)  01/24/12 76 kg (167 lb 8.8 oz)  01/08/12 78.926 kg (174 lb)    Physical Exam:  General appearance: alert, cooperative and no distress Head: Normocephalic, without obvious abnormality, atraumatic Eyes: conjunctivae/corneas clear. PERRL, EOM's intact.  Ears: ext nl Nose: Nares normal. Septum midline. Mucosa normal. No drainage or sinus tenderness. Throat: lips, mucosa, and tongue normal; teeth and gums normal Neck: no adenopathy, no carotid bruit, no JVD, supple, symmetrical, trachea midline and thyroid not enlarged, symmetric, no tenderness/mass/nodules Back: symmetric, no curvature. ROM normal. No CVA tenderness. Resp: clear to auscultation bilaterally wheezes in left lung Cardio: regular rate and rhythm, S1, S2 normal, no murmur, click, rub or gallop GI: soft, non-tender; bowel sounds normal; no masses,  no organomegaly Extremities: extremities normal, atraumatic, no cyanosis or edema Pulses: 2+ and symmetric Skin: Skin color, texture, turgor normal. No rashes or lesions Neurologic: right side 3+ to 4 upper and 2-3 lower. Fair sitting balance.. Right central 7 and tongue deviation, speech slightly dysarthric. occasional hiccups WU:JWJXB knee with mild crepitus, minimal  joint line pain, no effusion. Heart murmur 2-3/6  Assessment/Plan: 1. Functional deficits secondary to left pontine infarct which require 3+ hours per day of interdisciplinary therapy in a comprehensive inpatient rehab setting. Physiatrist is providing close team supervision and 24 hour management of active medical problems listed below. Physiatrist and rehab team continue to assess barriers to discharge/monitor patient progress toward functional and medical goals.   FIM: FIM - Bathing Bathing Steps Patient Completed: Chest;Right Arm;Left Arm;Abdomen;Front perineal area;Right upper leg;Left upper leg Bathing:  (evening bath)  FIM - Upper Body Dressing/Undressing Upper body dressing/undressing steps patient completed: Thread/unthread right sleeve of pullover shirt/dresss;Thread/unthread left sleeve of pullover shirt/dress;Put head through opening of pull over shirt/dress;Pull shirt over trunk Upper body dressing/undressing: 5: Supervision: Safety issues/verbal cues FIM - Lower Body Dressing/Undressing Lower body dressing/undressing steps patient completed: Thread/unthread right pants leg;Thread/unthread left pants leg;Pull pants up/down;Don/Doff left shoe Lower body dressing/undressing: 3: Mod-Patient completed 50-74% of tasks  FIM - Toileting Toileting steps completed by patient: Performs perineal hygiene Toileting Assistive Devices: Grab bar or rail for support Toileting: 2: Max-Patient completed 1 of 3 steps  FIM - Diplomatic Services operational officer Devices: Elevated toilet seat;Grab bars Toilet Transfers: 4-To toilet/BSC: Min A (steadying Pt. > 75%);4-From toilet/BSC: Min A (steadying Pt. > 75%)  FIM - Bed/Chair Transfer Bed/Chair Transfer Assistive Devices: Therapist, occupational: 4: Chair or W/C > Bed: Min A (steadying Pt. > 75%);4: Bed > Chair or W/C: Min A (steadying Pt. > 75%);5: Sit > Supine: Supervision (verbal cues/safety issues);5: Supine > Sit: Supervision  (verbal cues/safety issues)  FIM - Locomotion: Wheelchair Distance: 40 Locomotion: Wheelchair: 1: Total Assistance/staff pushes wheelchair (Pt<25%) FIM - Locomotion: Ambulation Locomotion: Ambulation Assistive Devices: Designer, industrial/product Ambulation/Gait Assistance: 3: Mod  assist Locomotion: Ambulation: 2: Travels 50 - 149 ft with minimal assistance (Pt.>75%)  Comprehension Comprehension Mode: Auditory Comprehension: 6-Follows complex conversation/direction: With extra time/assistive device  Expression Expression Mode: Verbal Expression: 5-Expresses complex 90% of the time/cues < 10% of the time  Social Interaction Social Interaction: 6-Interacts appropriately with others with medication or extra time (anti-anxiety, antidepressant).  Problem Solving Problem Solving: 6-Solves complex problems: With extra time  Memory Memory: 6-More than reasonable amt of time   A/P:  1. Left paracentral pontine infarct  2. DVT Prophylaxis/Anticoagulation: Subcutaneous Lovenox. Monitor platelet counts and any signs of bleeding  3. diabetes mellitus. Hemoglobin A1c 5.8. Presently with sliding scale insulin. Patient on Glucotrol 5 mg twice a day prior to admission. Resume glucotrol at home dose of 5mg . rx uti 4. Neuropsych: This patient is capable of making decisions on his/her own behalf.  5. History of renal transplant March of 2012. Continue immunosuppressants medicines as per renal services  6. GERD. Protonix  7. History of prostate cancer. PTA Bactrim 3 times weekly for bladder prophylaxis.  -bactrim held  -urinary retention better.. Still with occasional incontinence 8. Hiccups: low dose baclofen tid PRN. Hiccups have stopped 9. Right knee instability: mild OA likely with associated muscle weakness  -consider knee sleeve if persistent- therapy doesn't feel knee brace will be enough to realistically stabilize the knee  Voltaren gel, labs 10. Citrobacter UTI. Resistant to cipro. Will give 3  days of CTX. Thru 7/10 11. Poor appetite. Started on Megace with good results     LOS (Days) 16 A FACE TO FACE EVALUATION WAS PERFORMED  SWARTZ,ZACHARY T 02/13/2012, 8:23 AM

## 2012-02-13 NOTE — Progress Notes (Signed)
Occupational Therapy Discharge Summary  Patient Details  Name: Evan [name] MRN: [id number] Date of Birth: [birth year]  Today's Date: 02/13/2012  Patient has met 9 of 11 long term goals due to improved activity tolerance, improved balance, postural control, functional use of  RIGHT upper, RIGHT lower, LEFT upper and LEFT lower extremity, improved attention, improved awareness and improved coordination.  Pt progress was steady but slow this admission.  Pt continues to require extra time to complete all tasks.  Pt is supervision for UB dressing, mod A for bathing, mod A for LB dressing, mod A for toileting, min A for toilet transfers and shower transfers.  Pt performs sit to stand with supervision and requires steady A when standing to complete tasks.  Patient to discharge at overall min A with mobility and mod A for LB bathing and dressing level.  Pt will benefit from continued skilled care to continue to make gains in mobility and functional performance with ADL. Wife can not provide the level A pt needs at this time.   Reasons goals not met: Pt didn't met his bathing and LB dressing goal due to poor hip flexor strength and unable to cross LEs, still requiring A even with AE.  Recommendation:  Patient will benefit from ongoing skilled OT services in skilled nursing facility setting to continue to advance functional skills in the area of BADL and Reduce care partner burden.  Equipment: No equipment provided Pt discharging to SNF.  Reasons for discharge: discharge from hospital  Patient/family agrees with progress made and goals achieved: Yes  OT Discharge   Pain Pain Assessment Pain Assessment: No/denies pain ADL ADL Eating: Modified independent Where Assessed-Eating: Chair Grooming: Supervision/safety Where Assessed-Grooming: Sitting at sink Upper Body Bathing: Supervision/safety Where Assessed-Upper Body Bathing: Shower Lower Body Bathing: Moderate assistance Where Assessed-Lower  Body Bathing: Shower Upper Body Dressing: Supervision/safety Where Assessed-Upper Body Dressing: Wheelchair Lower Body Dressing: Moderate assistance Where Assessed-Lower Body Dressing: Wheelchair Toileting: Moderate assistance Where Assessed-Toileting: Teacher, adult education: Curator Method: Proofreader: Engineer, technical sales: International aid/development worker Method: Engineer, technical sales: Information systems manager with back Film/video editor: Insurance underwriter Method: Ambulating Vision/Perception    WFL Cognition Overall Cognitive Status: Appears within functional limits for tasks assessed  Trunk/Postural Assessment  Cervical Assessment Cervical Assessment: Exceptions to WFL (fwd head, decreased AROM) Thoracic Assessment Thoracic Assessment: Exceptions to WFL (kyphosis, decreased AROM) Lumbar Assessment Lumbar Assessment: Exceptions to [facility] (post tilt) Postural Control Postural Control: Within Functional Limits  Balance Static Sitting Balance Static Sitting - Balance Support: No upper extremity supported Static Sitting - Level of Assistance: 6: Modified independent (Device/Increase time) Dynamic Sitting Balance Dynamic Sitting - Balance Support: During functional activity Dynamic Sitting - Level of Assistance: 5: Stand by assistance Dynamic Sitting - Balance Activities: Lateral lean/weight shifting;Forward lean/weight shifting;Reaching for objects;Reaching across midline;Head control activities;Trunk control activities Extremity/Trunk Assessment RUE Assessment RUE Assessment: Exceptions to WFL (slight tone, decrease ROM, 3+/5) LUE Assessment LUE Assessment: Within Functional Limits  See FIM for current functional status  Rich Brave 02/13/2012, 3:21 PM

## 2012-02-13 NOTE — Progress Notes (Signed)
Speech Language Pathology Daily Session Note  Patient Details  Name: Evan [name] MRN: [id number] Date of Birth: [birth year]  Today's Date: 02/13/2012 Time: 1130-1230 Time Calculation (min): 60 min  Short Term Goals: Week 2: SLP Short Term Goal 1 (Week 2): Pt will utilize speech intelligibility strategies at the conversation level with supervision verbal cues and question cues.  SLP Short Term Goal 2 (Week 2): Pt will consume Dys. 3 textures and thin liquids without overt s/s of aspiration.  SLP Short Term Goal 3 (Week 2): Pt will utilize swallowing compensatory strategies with mod I.   Skilled Therapeutic Interventions: Pt seen in [place] with focus on utilization of swallowing compensatory strategies. Pt without overt s/s of aspiration and utilized strategies with Mod I.    FIM:  FIM - Eating Eating Activity: 6: Swallowing techniques: self-managed  Pain Pain Assessment Pain Assessment: No/denies pain  Therapy/Group: Dysphagia Group  Darryll Raju 02/13/2012, 2:57 PM

## 2012-02-13 NOTE — Progress Notes (Signed)
Physical Therapy Discharge Summary  Patient Details  Name: Evan [name] MRN: [id number] Date of Birth: Aug 27, 1931  Today's Date: 02/13/2012  Patient has met 6 of 6 long term goals due to improved activity tolerance, improved balance, improved postural control, increased strength and ability to compensate for deficits.  Patient to discharge at an ambulatory level Min Assist.   Pt to SNF at discharge.  Reasons goals not met: n/a  Recommendation:  Patient will benefit from ongoing skilled PT services in skilled nursing facility setting to continue to advance safe functional mobility, address ongoing impairments in strength, balance, activity tolerance, gait, and minimize fall risk.  Equipment: none provided, pt to SNF  Reasons for discharge: treatment goals met and discharge from hospital  Patient/family agrees with progress made and goals achieved: Yes  PT Discharge Cognition Overall Cognitive Status: Appears within functional limits for tasks assessed Sensation Sensation Light Touch: Appears Intact Proprioception: Appears Intact Coordination Gross Motor Movements are Fluid and Coordinated: Yes Coordination and Movement Description: slow reactions Motor  Motor Motor: Within Functional Limits   Trunk/Postural Assessment  Cervical Assessment Cervical Assessment:  (fwd head, decreased AROM) Thoracic Assessment Thoracic Assessment:  (kyphosis, decreased AROM) Lumbar Assessment Lumbar Assessment:  (post tilit) Postural Control Postural Control: Within Functional Limits  Balance Static Sitting Balance Static Sitting - Level of Assistance: 6: Modified independent (Device/Increase time) Dynamic Sitting Balance Dynamic Sitting - Level of Assistance: 5: Stand by assistance Static Standing Balance Static Standing - Balance Support: During functional activity Static Standing - Level of Assistance: 4: Min assist Dynamic Standing Balance Dynamic Standing - Balance Support:  During functional activity Dynamic Standing - Level of Assistance: 4: Min assist Extremity Assessment      RLE Assessment RLE Assessment:  (grossly 3-/5) LLE Assessment LLE Assessment:  (grossly 3/5)  See FIM for current functional status  Trini Christiansen 02/13/2012, 11:13 AM

## 2012-02-13 NOTE — Progress Notes (Signed)
Occupational Therapy Session Note  Patient Details  Name: Evan [name] MRN: [id number] Date of Birth: Aug 03, 1932  Today's Date: 02/13/2012 Time: 4098-1191 Time Calculation (min): 58 min  Short Term Goals: Week 2:   =LTG  Skilled Therapeutic Interventions/Progress Updates:    ADL retraining including bathing at shower level and dressing with sit to stand from w/c.  Pt continues to require extra time to complete tasks but exhibiting increased endurance/activity tolerance throughout session.  Pt performs sit<>stand with supervision and demonstrates increased standing balance while standing to bathe front perineal area and pull up pants.   Focus on activity tolerance, standing balance, and safety awareness  Therapy Documentation Precautions:  Precautions Precautions: Fall Restrictions Weight Bearing Restrictions: No General: General Amount of Missed OT Time (min): 17 Minutes Vital Signs:   Pain:  Pt denies pain  See FIM for current functional status  Therapy/Group: Individual Therapy  Rich Brave 02/13/2012, 10:19 AM

## 2012-02-13 NOTE — Care Management Note (Signed)
Per State Regulation 482.30 This chart was reviewed for medical necessity with respect to the patient's Admission/Duration of stay. Discussed w/ pt & wife their questions about SNF d/c & the bed offer options.  They have chosen the pvt room bed offer from [facility] for tomorrow.  Wife requested to speak w/ Dr. Riley Kill about pt's medical readiness for SNF.  He spoke w/ her this afternoon.   Evan [name]                 Nurse Care Manager              Next Review Date: none

## 2012-02-14 ENCOUNTER — Inpatient Hospital Stay (HOSPITAL_COMMUNITY): Payer: Medicare Other

## 2012-02-14 NOTE — Progress Notes (Signed)
Pt discharged to [facility], full report called to [facility]. Pt transported via ambulance, wife and daughter at bedside.

## 2012-02-14 NOTE — Progress Notes (Signed)
Social Work Discharge Note Discharge Note  The overall goal for the admission was met for:   Discharge location: No - plan changed to SNF due to pt's care needs and wife unable to provide this level of care at home.  Length of Stay: No - LOS changed due to SNF bed offer  Discharge activity level: Yes  Home/community participation: Yes  Services provided included: MD, RD, PT, OT, SLP, RN, CM, TR, Pharmacy and SW  Financial Services: Medicare and Private Insurance: [place] BCBS  Follow-up services arranged: Other: SNF placement at [facility]  Comments (or additional information):  Patient/Family verbalized understanding of follow-up arrangements: Yes  Individual responsible for coordination of the follow-up plan: patient  Confirmed correct DME delivered: NA  Evan [name]

## 2012-02-14 NOTE — Progress Notes (Signed)
Patient ID: Evan [name], male   DOB: [birth year], 78 y.o.   MRN: [id number]  Subjective/Complaints: No new complaints. Awaiting breakfast. Plan discharge to skilled nursing facility today Other ros items negative  Objective: Vital Signs: Blood pressure 154/70, pulse 65, temperature 97.9 F (36.6 C), temperature source Oral, resp. rate 18, height 5' 10.87" (1.8 m), weight 77 kg (169 lb 12.1 oz), SpO2 100.00%. No results found. No results found for this basename: WBC:2,HGB:2,HCT:2,PLT:2 in the last 72 hours No results found for this basename: NA:2,K:2,CL:2,CO2:2,GLUCOSE:2,BUN:2,CREATININE:2,CALCIUM:2 in the last 72 hours CBG (last 3)   Basename 02/13/12 2036 02/13/12 1556 02/13/12 1121  GLUCAP 257* 228* 199*    Wt Readings from Last 3 Encounters:  02/13/12 77 kg (169 lb 12.1 oz)  01/24/12 76 kg (167 lb 8.8 oz)  01/08/12 78.926 kg (174 lb)    Physical Exam:  General appearance: alert, cooperative and no distress Head: Normocephalic, without obvious abnormality, atraumatic Eyes: conjunctivae/corneas clear. PERRL, EOM's intact.  Ears: ext nl Nose: Nares normal. Septum midline. Mucosa normal. No drainage or sinus tenderness. Throat: lips, mucosa, and tongue normal; teeth and gums normal Neck: no adenopathy, no carotid bruit, no JVD, supple, symmetrical, trachea midline and thyroid not enlarged, symmetric, no tenderness/mass/nodules Back: symmetric, no curvature. ROM normal. No CVA tenderness. Resp: clear to auscultation bilaterally wheezes in left lung Cardio: regular rate and rhythm, S1, S2 normal, no murmur, click, rub or gallop GI: soft, non-tender; bowel sounds normal; no masses,  no organomegaly Extremities: extremities normal, atraumatic, no cyanosis or edema Pulses: 2+ and symmetric Skin: Skin color, texture, turgor normal. No rashes or lesions Neurologic: right side 3+ to 4 upper and 2-3 lower. Fair sitting balance.. Right central 7 and tongue deviation, speech slightly  dysarthric. occasional hiccups FA:[id number] knee with mild crepitus, minimal joint line pain, no effusion. Heart murmur 2-3/6  Assessment/Plan: 1. Functional deficits secondary to left pontine infarct which require 3+ hours per day of interdisciplinary therapy in a comprehensive inpatient rehab setting. Physiatrist is providing close team supervision and 24 hour management of active medical problems listed below. Physiatrist and rehab team continue to assess barriers to discharge/monitor patient progress toward functional and medical goals.  To SNF today  FIM: FIM - Bathing Bathing Steps Patient Completed: Chest;Right Arm;Left Arm;Abdomen;Front perineal area;Right upper leg;Left upper leg Bathing: 3: Mod-Patient completes 5-7 45f 10 parts or 50-74%  FIM - Upper Body Dressing/Undressing Upper body dressing/undressing steps patient completed: Thread/unthread right sleeve of pullover shirt/dresss;Thread/unthread left sleeve of pullover shirt/dress;Put head through opening of pull over shirt/dress;Pull shirt over trunk Upper body dressing/undressing: 5: Supervision: Safety issues/verbal cues FIM - Lower Body Dressing/Undressing Lower body dressing/undressing steps patient completed: Thread/unthread right pants leg;Thread/unthread left pants leg;Pull pants up/down;Don/Doff left shoe;Fasten/unfasten right shoe Lower body dressing/undressing: 3: Mod-Patient completed 50-74% of tasks  FIM - Toileting Toileting steps completed by patient: Performs perineal hygiene Toileting Assistive Devices: Grab bar or rail for support Toileting: 2: Max-Patient completed 1 of 3 steps  FIM - Diplomatic Services operational officer Devices: Elevated toilet seat Toilet Transfers: 4-To toilet/BSC: Min A (steadying Pt. > 75%)  FIM - Bed/Chair Transfer Bed/Chair Transfer Assistive Devices: Therapist, occupational: 4: Supine > Sit: Min A (steadying Pt. > 75%/lift 1 leg)  FIM - Locomotion: Wheelchair Distance:  40 Locomotion: Wheelchair: 1: Total Assistance/staff pushes wheelchair (Pt<25%) FIM - Locomotion: Ambulation Locomotion: Ambulation Assistive Devices: Designer, industrial/product Ambulation/Gait Assistance: 3: Mod assist Locomotion: Ambulation: 4: Travels 150 ft or more with minimal assistance (Pt.>75%)  Comprehension Comprehension Mode: Auditory Comprehension: 6-Follows complex conversation/direction: With extra time/assistive device  Expression Expression Mode: Verbal Expression: 6-Expresses complex ideas: With extra time/assistive device  Social Interaction Social Interaction: 6-Interacts appropriately with others with medication or extra time (anti-anxiety, antidepressant).  Problem Solving Problem Solving: 6-Solves complex problems: With extra time  Memory Memory: 6-More than reasonable amt of time   A/P:  1. Left paracentral pontine infarct . Continue Plavix therapy. Plan followup with Dr. Riley Kill 03/05/2012 2. DVT Prophylaxis/Anticoagulation: Subcutaneous Lovenox. Monitor platelet counts and any signs of bleeding  3. diabetes mellitus. Hemoglobin A1c 5.8. Glucotrol 5 mg by mouth daily. Blood sugars within acceptable limits 4. Neuropsych: This patient is capable of making decisions on his/her own behalf.  5. History of renal transplant March of 2012. Continue immunosuppressants medicines as per renal services . Plan followup with [facility] as outpatient 6. GERD. Prilosec . Patient denies any nausea or vomiting 7. History of prostate cancer. PTA Bactrim 3 times weekly for bladder prophylaxis.  -bactrim can be resumed  -urinary retention better.. Still with occasional incontinence  -advised pt to wait on uro intervention (pump=) for bladder incontinence 8. Hiccups: low dose baclofen tid PRN. Hiccups have stopped 9. Right knee instability: mild OA likely with associated muscle weakness  -consider knee sleeve if persistent- therapy doesn't feel knee brace will be enough  to realistically stabilize the knee  Voltaren gel, labs 10. Citrobacter UTI. Ceftriaxone completed. Can resume prophylactic bactrim 11. Poor appetite. Started on Megace with good results. Patient tolerating a mechanical soft diet     LOS (Days) 17 A FACE TO FACE EVALUATION WAS PERFORMED  ANGIULLI,DANIEL J. 02/14/2012, 7:12 AM

## 2012-02-14 NOTE — Progress Notes (Signed)
Orthopedic Tech Progress Note Patient Details:  Evan [name] Sep 06, 1931 [id number]  Patient ID: [name], male   DOB: [birth year], 78 y.o.   MRN: [id number] [facility] ORDER COMPLETED BY ADVANCED  Nikki Dom 02/14/2012, 3:49 PM

## 2012-02-15 ENCOUNTER — Other Ambulatory Visit: Payer: Self-pay | Admitting: Nephrology

## 2012-02-18 ENCOUNTER — Other Ambulatory Visit (HOSPITAL_COMMUNITY): Payer: Self-pay | Admitting: *Deleted

## 2012-02-19 ENCOUNTER — Encounter (HOSPITAL_COMMUNITY)
Admission: RE | Admit: 2012-02-19 | Discharge: 2012-02-19 | Disposition: A | Discharge status: Home / Self Care | Payer: No Typology Code available for payment source | Source: Ambulatory Visit | Attending: Nephrology | Admitting: Nephrology

## 2012-02-19 DIAGNOSIS — D649 Anemia, unspecified: Secondary | ICD-10-CM | POA: Insufficient documentation

## 2012-02-19 DIAGNOSIS — N182 Chronic kidney disease, stage 2 (mild): Secondary | ICD-10-CM | POA: Insufficient documentation

## 2012-02-19 DIAGNOSIS — N2581 Secondary hyperparathyroidism of renal origin: Secondary | ICD-10-CM | POA: Insufficient documentation

## 2012-02-19 MED ORDER — EPOETIN ALFA 20000 UNIT/ML IJ SOLN: 20000.0000 [IU] | INTRAMUSCULAR | Status: DC

## 2012-02-19 NOTE — Progress Notes (Signed)
2 phlebotomists attempted unsuccessfully to draw labs. Shaquina a [facility] notified. Orders received to reschedule. Patient aware he should rehydrate prior to appt.

## 2012-02-22 ENCOUNTER — Encounter (HOSPITAL_COMMUNITY)
Admission: RE | Admit: 2012-02-22 | Discharge: 2012-02-22 | Disposition: A | Discharge status: Home / Self Care | Payer: No Typology Code available for payment source | Source: Ambulatory Visit | Attending: Nephrology | Admitting: Nephrology

## 2012-02-22 MED ORDER — EPOETIN ALFA 20000 UNIT/ML IJ SOLN: 20000.0000 [IU] | INTRAMUSCULAR | Status: DC

## 2012-02-22 NOTE — Progress Notes (Signed)
Multiple attempts to draw labs made by phlebotomist without success. Crystal at [facility] aware. Patient sent home with instructions that [facility] will call to advise next step.

## 2012-02-26 ENCOUNTER — Encounter (HOSPITAL_COMMUNITY)
Admission: RE | Admit: 2012-02-26 | Discharge: 2012-02-26 | Disposition: A | Discharge status: Home / Self Care | Payer: No Typology Code available for payment source | Source: Ambulatory Visit | Attending: Nephrology | Admitting: Nephrology

## 2012-02-26 MED ORDER — EPOETIN ALFA 20000 UNIT/ML IJ SOLN: 20000.0000 [IU] | INTRAMUSCULAR | Status: DC

## 2012-02-26 MED ORDER — EPOETIN ALFA 20000 UNIT/ML IJ SOLN
INTRAMUSCULAR | Status: AC
  Administered 2012-02-26: 20000 [IU] via SUBCUTANEOUS
  Filled 2012-02-26: qty 1

## 2012-03-04 ENCOUNTER — Encounter: Payer: Medicare Other | Admitting: Physical Medicine & Rehabilitation

## 2012-03-04 ENCOUNTER — Encounter: Payer: Self-pay | Admitting: Physical Medicine & Rehabilitation

## 2012-03-04 ENCOUNTER — Encounter: Payer: Medicare Other | Attending: Physical Medicine & Rehabilitation | Admitting: Physical Medicine & Rehabilitation

## 2012-03-04 VITALS — BP 155/44 | HR 74 | Resp 16 | Ht 71.5 in | Wt 169.8 lb

## 2012-03-04 DIAGNOSIS — M238X9 Other internal derangements of unspecified knee: Secondary | ICD-10-CM | POA: Insufficient documentation

## 2012-03-04 DIAGNOSIS — M1710 Unilateral primary osteoarthritis, unspecified knee: Secondary | ICD-10-CM | POA: Insufficient documentation

## 2012-03-04 DIAGNOSIS — I69998 Other sequelae following unspecified cerebrovascular disease: Secondary | ICD-10-CM | POA: Insufficient documentation

## 2012-03-04 DIAGNOSIS — K219 Gastro-esophageal reflux disease without esophagitis: Secondary | ICD-10-CM | POA: Insufficient documentation

## 2012-03-04 DIAGNOSIS — Z8546 Personal history of malignant neoplasm of prostate: Secondary | ICD-10-CM | POA: Insufficient documentation

## 2012-03-04 DIAGNOSIS — Z940 Kidney transplant status: Secondary | ICD-10-CM | POA: Insufficient documentation

## 2012-03-04 DIAGNOSIS — E119 Type 2 diabetes mellitus without complications: Secondary | ICD-10-CM | POA: Insufficient documentation

## 2012-03-04 DIAGNOSIS — N390 Urinary tract infection, site not specified: Secondary | ICD-10-CM | POA: Insufficient documentation

## 2012-03-04 DIAGNOSIS — I6350 Cerebral infarction due to unspecified occlusion or stenosis of unspecified cerebral artery: Secondary | ICD-10-CM

## 2012-03-04 DIAGNOSIS — I639 Cerebral infarction, unspecified: Secondary | ICD-10-CM

## 2012-03-04 DIAGNOSIS — B9689 Other specified bacterial agents as the cause of diseases classified elsewhere: Secondary | ICD-10-CM | POA: Insufficient documentation

## 2012-03-04 NOTE — Progress Notes (Signed)
Subjective:    Patient ID: Evan [name], male    DOB: [birth year], 78 y.o.   MRN: [id number]  HPI  [name] is back regarding his left pontine infarct. He is at [facility] still receiving therapy. He is improving with stability as well as stamina and mobility. He is wearing the right knee brace has helped with his knee support, pain , and confidence.   His appetite has been a problem again, and he was placed back on megace which helped. UTI's have resurfaced and required further treatment. His sugars have been out of control as well.  He is still not a  Point where he and his wife can manage at home.  Pain Inventory Average Pain 0 Pain Right Now 0 My pain is no pain  In the last 24 hours, has pain interfered with the following? General activity 0 Relation with others 0 Enjoyment of life 0 What TIME of day is your pain at its worst? no pain Sleep (in general) Fair  Pain is worse with: no pain Pain improves with: no pain Relief from Meds: no pain medication  Mobility use a walker ability to climb steps?  no do you drive?  no use a wheelchair needs help with transfers  Function retired I need assistance with the following:  dressing, bathing, toileting, meal prep, household duties and shopping  Neuro/Psych bladder control problems weakness trouble walking  Prior Studies Any changes since last visit?  no  Physicians involved in your care Any changes since last visit?  no   History reviewed. No pertinent family history. History   Social History  . Marital Status: Married    Spouse Name: N/A    Number of Children: N/A  . Years of Education: N/A   Social History Main Topics  . Smoking status: Former Smoker -- 15 years    Types: Cigarettes    Quit date: 01/24/1983  . Smokeless tobacco: Never Used  . Alcohol Use: No  . Drug Use: No  . Sexually Active:    Other Topics Concern  . None   Social History Narrative  . None   Past Surgical History    Procedure Date  . Kidney transplant   . Prostate surgery approx. 2003    seed implant   Past Medical History  Diagnosis Date  . H/O kidney transplant   . Diabetes mellitus   . Cancer     hx prostate cancer  . ESRD (end stage renal disease) 2011    hemodialysis started Sept 2011, cadaveric (double) renal  transplant Mar 2012 at [facility]  . Stroke    BP 155/44  Pulse 74  Resp 16  Ht 5' 11.5" (1.816 m)  Wt 169 lb 12 oz (76.998 kg)  BMI 23.35 kg/m2  SpO2 100%    Review of Systems  Constitutional: Positive for appetite change.  Gastrointestinal: Positive for abdominal pain and constipation.  Musculoskeletal: Positive for gait problem.       Hiccups  Neurological: Positive for weakness.  All other systems reviewed and are negative.       Objective:   Physical Exam  General: Alert and oriented x 3, No apparent distress HEENT: Head is normocephalic, atraumatic, PERRLA, EOMI, sclera anicteric, oral mucosa pink and moist, dentition intact, ext ear canals clear,  Neck: Supple without JVD or lymphadenopathy Heart: Reg rate and rhythm. No murmurs rubs or gallops Chest: CTA bilaterally without wheezes, rales, or rhonchi; no distress Abdomen: Soft, non-tender, non-distended, bowel sounds positive.  Extremities: No clubbing, cyanosis, or edema. Pulses are 2+ Skin: Clean and intact without signs of breakdown Neuro: Cognitvely intact. Right central 7 and tongue deviation. Speech generally clear. RUE is grossly 3-4/5 with more weakness in the HI. RLE is 4/t with contracture at the right knee. Sensation is grossly intact. DTR's are 1+ generally. Musculoskeletal:20 flexion contracture right knee with tight hamstring. Mild tenderness to palpation of the right pec major and minor. Posture appropriate Psych: Pt's affect is appropriate. Pt is cooperative         Assessment & Plan:  Assessment: 1. Left paracentral pontine infarction.  2. Subcutaneous Lovenox for deep venous thrombosis  prophylaxis.  3. Diabetes mellitus.  4. History of renal transplant March 2012.  5. Gastroesophageal reflux disease.  6. History of prostate cancer.  7. Citrobacter urinary tract infection.  8. Right knee instability with mild osteoarthritis.  Plan: 1. The patient continues to progress, albeit slowly. I think this points to the fact that he had a lot of premorbid issues going on including his renal txplnt, OA of right knee, prostate cancer, etc. He should stay at [facility] place until such time where he and his wife can manage things. It's my opinion, that he won't be able to come home until he's at a supervison level.  This may take him another 2 months. 2. Continue with knee brace wear on the right. He benefits from this for extra stability, decreased pain, and increased confidence. 3. Discuss DM mgt with MD at [facility]. Pt will also get more food from home 4. Consider urology follow up for his bladder once he's discharged to home 5. I'll see him back in about 2-3 months.

## 2012-03-04 NOTE — Patient Instructions (Signed)
CONTINUE WITH YOUR EXERCISES TO IMPROVE YOUR RANGE OF MOTION, STRENGTH, AND DEXTERITY. YOU ARE GRADUALLY IMPROVING, BUT YOU WILL NEED TO BE PATIENT.   I'LL SEE YOU BACK IN ABOUT 2 MONTHS. PLEASE CALL ME WITH ANY QUESTIONS.

## 2012-03-05 ENCOUNTER — Inpatient Hospital Stay: Payer: Medicare Other | Admitting: Physical Medicine & Rehabilitation

## 2012-03-18 ENCOUNTER — Emergency Department (HOSPITAL_COMMUNITY)
Admission: EM | Admit: 2012-03-18 | Discharge: 2012-03-18 | Disposition: A | Discharge status: Home / Self Care | Payer: Medicare Other | Attending: Emergency Medicine | Admitting: Emergency Medicine

## 2012-03-18 ENCOUNTER — Emergency Department (HOSPITAL_COMMUNITY): Payer: Medicare Other

## 2012-03-18 ENCOUNTER — Encounter (HOSPITAL_COMMUNITY): Payer: Self-pay | Admitting: *Deleted

## 2012-03-18 DIAGNOSIS — S0003XA Contusion of scalp, initial encounter: Secondary | ICD-10-CM | POA: Insufficient documentation

## 2012-03-18 DIAGNOSIS — W010XXA Fall on same level from slipping, tripping and stumbling without subsequent striking against object, initial encounter: Secondary | ICD-10-CM | POA: Insufficient documentation

## 2012-03-18 DIAGNOSIS — S0083XA Contusion of other part of head, initial encounter: Secondary | ICD-10-CM

## 2012-03-18 DIAGNOSIS — N289 Disorder of kidney and ureter, unspecified: Secondary | ICD-10-CM

## 2012-03-18 DIAGNOSIS — E119 Type 2 diabetes mellitus without complications: Secondary | ICD-10-CM | POA: Insufficient documentation

## 2012-03-18 DIAGNOSIS — E875 Hyperkalemia: Secondary | ICD-10-CM

## 2012-03-18 DIAGNOSIS — W19XXXA Unspecified fall, initial encounter: Secondary | ICD-10-CM

## 2012-03-18 DIAGNOSIS — S0180XA Unspecified open wound of other part of head, initial encounter: Secondary | ICD-10-CM | POA: Insufficient documentation

## 2012-03-18 DIAGNOSIS — R011 Cardiac murmur, unspecified: Secondary | ICD-10-CM | POA: Insufficient documentation

## 2012-03-18 DIAGNOSIS — S0181XA Laceration without foreign body of other part of head, initial encounter: Secondary | ICD-10-CM

## 2012-03-18 DIAGNOSIS — M5030 Other cervical disc degeneration, unspecified cervical region: Secondary | ICD-10-CM | POA: Insufficient documentation

## 2012-03-18 DIAGNOSIS — Z940 Kidney transplant status: Secondary | ICD-10-CM | POA: Insufficient documentation

## 2012-03-18 DIAGNOSIS — Y9210 Unspecified residential institution as the place of occurrence of the external cause: Secondary | ICD-10-CM | POA: Insufficient documentation

## 2012-03-18 LAB — CBC
HCT: 33.4 % — ABNORMAL LOW (ref 39.0–52.0)
Hemoglobin: 11.6 g/dL — ABNORMAL LOW (ref 13.0–17.0)
MCH: 27.2 pg (ref 26.0–34.0)
RBC: 4.27 MIL/uL (ref 4.22–5.81)

## 2012-03-18 LAB — BASIC METABOLIC PANEL
Chloride: 106 mEq/L (ref 96–112)
GFR calc Af Amer: 46 mL/min — ABNORMAL LOW (ref >90)
GFR calc non Af Amer: 40 mL/min — ABNORMAL LOW (ref >90)
Potassium: 5.6 mEq/L — ABNORMAL HIGH (ref 3.5–5.1)
Sodium: 136 mEq/L (ref 135–145)

## 2012-03-18 LAB — POCT I-STAT, CHEM 8
BUN: 44 mg/dL — ABNORMAL HIGH (ref 6–23)
Creatinine, Ser: 1.7 mg/dL — ABNORMAL HIGH (ref 0.50–1.35)
Sodium: 140 mEq/L (ref 135–145)
TCO2: 19 mmol/L (ref 0–100)

## 2012-03-18 MED ORDER — ACETAMINOPHEN 325 MG PO TABS
650.0000 mg | ORAL_TABLET | Freq: Once | ORAL | Status: AC
  Administered 2012-03-18: 650 mg via ORAL
  Filled 2012-03-18: qty 2

## 2012-03-18 MED ORDER — SODIUM CHLORIDE 0.9 % IV BOLUS (SEPSIS)
1000.0000 mL | Freq: Once | INTRAVENOUS | Status: AC
  Administered 2012-03-18: 1000 mL via INTRAVENOUS

## 2012-03-18 MED ORDER — SODIUM POLYSTYRENE SULFONATE 15 GM/60ML PO SUSP: 30.0000 g | Freq: Once | ORAL | Status: DC

## 2012-03-18 NOTE — ED Provider Notes (Signed)
History     CSN: [id number]  Arrival date & time 03/18/12  1015   First MD Initiated Contact with Patient 03/18/12 1018      Chief Complaint  Patient presents with  . Head Laceration    (Consider location/radiation/quality/duration/timing/severity/associated sxs/prior treatment) HPI Comments: Evan [name] is a 78 y.o. Male who presents from an assisted living facility due to a fall. Pt with hx of a CVA, with right sided deficit, usually requiring assistance with transferring and ambulating. Pt uses wheel chair. Pt states today, he did not call for help and was trying to get up on his own, states lost balance and was trying to reach the wheel chair when he fell forward. Pt states he hit his head on the floor. He was unable to get up on his own, and was on the floor for about 20 min until someone found him. Pt denies LOC. States only having a mild headache, no other injuries. Pt is on plavix. Pt states he did have a large amount of bleeding out of his laceration on his forehead, family concerned that "he is bleeding out." Pt denies feeling dizzy, lightheaded, having any chest pain, SOB. No pain in the neck or back.    Past Medical History  Diagnosis Date  . H/O kidney transplant   . Diabetes mellitus   . Cancer     hx prostate cancer  . ESRD (end stage renal disease) 2011    hemodialysis started Sept 2011, cadaveric (double) renal  transplant Mar 2012 at [facility]  . Stroke     Past Surgical History  Procedure Date  . Kidney transplant   . Prostate surgery approx. 2003    seed implant    No family history on file.  History  Substance Use Topics  . Smoking status: Former Smoker -- 15 years    Types: Cigarettes    Quit date: 01/24/1983  . Smokeless tobacco: Never Used  . Alcohol Use: No      Review of Systems  Eyes: Negative for visual disturbance.  Musculoskeletal: Positive for gait problem.  Skin: Positive for wound.  Neurological: Positive for weakness and headaches.  Negative for dizziness and light-headedness.  All other systems reviewed and are negative.    Allergies  Review of patient's allergies indicates no known allergies.  Home Medications   Current Outpatient Rx  Name Route Sig Dispense Refill  . ACETAMINOPHEN 500 MG PO TABS Oral Take 1,000 mg by mouth every 6 (six) hours as needed. For pain    . BACLOFEN 5 MG HALF TABLET Oral Take 5 mg by mouth 3 (three) times daily.    Marland Kitchen CALCIUM CARBONATE ANTACID 750 MG PO CHEW Oral Chew 1 tablet by mouth 3 (three) times daily.    Marland Kitchen CLOPIDOGREL BISULFATE 75 MG PO TABS Oral Take 75 mg by mouth daily.    Marland Kitchen DILTIAZEM HCL ER BEADS 300 MG PO CP24 Oral Take 300 mg by mouth daily. Uncertain if he is getting this at [facility]    . FUROSEMIDE 20 MG PO TABS Oral Take 10 mg by mouth daily.    Marland Kitchen GLIPIZIDE 5 MG PO TABS Oral Take by mouth 2 (two) times daily before a meal.     . HYDRALAZINE HCL 25 MG PO TABS Oral Take 25 mg by mouth 2 (two) times daily.    Marland Kitchen LORATADINE 10 MG PO TABS Oral Take 10 mg by mouth daily.    . MEGESTROL ACETATE 400 MG/10ML PO SUSP Oral  Take 400 mg by mouth daily as needed. For appetite    . MYCOPHENOLATE SODIUM 180 MG PO TBEC Oral Take 180 mg by mouth 2 (two) times daily.    Marland Kitchen OMEPRAZOLE 40 MG PO CPDR Oral Take 40 mg by mouth 2 (two) times daily.    Marland Kitchen POLYETHYLENE GLYCOL 3350 PO PACK Oral Take 17 g by mouth daily.    . SULFAMETHOXAZOLE-TRIMETHOPRIM 400-80 MG PO TABS Oral Take 1 tablet by mouth every Monday, Wednesday, and Friday.    Marland Kitchen TACROLIMUS 1 MG PO CAPS Oral Take 5 mg by mouth 2 (two) times daily.      BP 186/74  Pulse 88  Temp 98 F (36.7 C) (Oral)  Resp 16  SpO2 100%  Physical Exam  Nursing note and vitals reviewed. Constitutional: He is oriented to person, place, and time. He appears well-developed and well-nourished. No distress.  HENT:  Head: Normocephalic.       Stellae 3cm laceration to the left forehead, hemostatic Large 4cmx4cm hematoma under the laceration. TMs normal, no  hemotympanium  Eyes: Conjunctivae and EOM are normal. Pupils are equal, round, and reactive to light.  Neck: Normal range of motion. Neck supple.  Cardiovascular: Normal rate and regular rhythm.   Murmur heard. Pulmonary/Chest: Effort normal and breath sounds normal. No respiratory distress. He has no wheezes. He has no rales.  Abdominal: Soft. Bowel sounds are normal. He exhibits no distension. There is no tenderness.  Musculoskeletal: Normal range of motion.  Neurological: He is alert and oriented to person, place, and time. No cranial nerve deficit. Coordination normal.       Right upper and lower extremity weaker than left, 4/5.   Skin: Skin is warm and dry.  Psychiatric: He has a normal mood and affect.    ED Course  Procedures (including critical care time)  Pt post mechanical fall. Pt appears dry on exam, per family concerned that he may be dehydrated and may have bled out since large bleeding noted when he fell. Pt with large hematoma of forehead. Will get CT head, basic labs. Pt has no complaints at this time. He is AAOx3.   Results for orders placed during the hospital encounter of 03/18/12  CBC      Component Value Range   WBC 12.0 (*) 4.0 - 10.5 K/uL   RBC 4.27  4.22 - 5.81 MIL/uL   Hemoglobin 11.6 (*) 13.0 - 17.0 g/dL   HCT 16.1 (*) 09.6 - 04.5 %   MCV 78.2  78.0 - 100.0 fL   MCH 27.2  26.0 - 34.0 pg   MCHC 34.7  30.0 - 36.0 g/dL   RDW 40.9  81.1 - 91.4 %   Platelets 248  150 - 400 K/uL  POCT I-STAT, CHEM 8      Component Value Range   Sodium 140  135 - 145 mEq/L   Potassium 5.5 (*) 3.5 - 5.1 mEq/L   Chloride 111  96 - 112 mEq/L   BUN 44 (*) 6 - 23 mg/dL   Creatinine, Ser 7.82 (*) 0.50 - 1.35 mg/dL   Glucose, Bld 956 (*) 70 - 99 mg/dL   Calcium, Ion 2.13 (*) 1.13 - 1.30 mmol/L   TCO2 19  0 - 100 mmol/L   Hemoglobin 12.9 (*) 13.0 - 17.0 g/dL   HCT 08.6 (*) 57.8 - 46.9 %  BASIC METABOLIC PANEL      Component Value Range   Sodium 136  135 - 145 mEq/L  Potassium 5.6 (*) 3.5 - 5.1 mEq/L   Chloride 106  96 - 112 mEq/L   CO2 20  19 - 32 mEq/L   Glucose, Bld 196 (*) 70 - 99 mg/dL   BUN 43 (*) 6 - 23 mg/dL   Creatinine, Ser 4.09 (*) 0.50 - 1.35 mg/dL   Calcium 9.6  8.4 - 81.1 mg/dL   GFR calc non Af Amer 40 (*) >90 mL/min   GFR calc Af Amer 46 (*) >90 mL/min   Ct Head Wo Contrast  03/18/2012  *RADIOLOGY REPORT*  Clinical Data: Fall, head laceration  CT HEAD WITHOUT CONTRAST,CT CERVICAL SPINE WITHOUT CONTRAST  Technique:  Contiguous axial images were obtained from the base of the skull through the vertex without contrast.,Technique: Multidetector CT imaging of the cervical spine was performed. Multiplanar CT image reconstructions were also generated.  Comparison: 01/24/2012  Findings: No skull fracture is noted.  There is a skin irregularity and soft tissue swelling left frontal scalp.  Subcutaneous hematoma in the frontal region measures 1.2 x 2.5 cm.  No intracranial hemorrhage, mass effect or midline shift.  No acute infarction.  No mass lesion is noted on this unenhanced scan.  Small lacunar infarct is noted in the left pons.  IMPRESSION: No acute intracranial abnormality.  There is soft tissue swelling subcutaneous stranding and subcutaneous hematoma in the left frontal region.  Hematoma measures 2.5 x 1.2 cm.  CT cervical spine without IV contrast:  Axial images of the cervical spine shows no acute fracture or subluxation.  There is disc space flattening with disc calcifications at C5-C6 level.  Mild anterior and mild posterior spurring at C5-C6 level.  Anterior spurring noted lower endplate of the C4 vertebral body.  Mild disc space flattening with mild anterior spurring and mild posterior spurring at C6-C7 level.  Mild anterior spurring lower endplate of the C7 vertebral body.  No prevertebral soft tissue swelling.  Cervical airway is patent.  There is no pneumothorax in visualized lung apices.  Impression: 1.  No acute fracture or subluxation.   Degenerative changes as described above.  Original Report Authenticated By: Natasha Mead, M.D.   Ct Cervical Spine Wo Contrast  03/18/2012  *RADIOLOGY REPORT*  Clinical Data: Fall, head laceration  CT HEAD WITHOUT CONTRAST,CT CERVICAL SPINE WITHOUT CONTRAST  Technique:  Contiguous axial images were obtained from the base of the skull through the vertex without contrast.,Technique: Multidetector CT imaging of the cervical spine was performed. Multiplanar CT image reconstructions were also generated.  Comparison: 01/24/2012  Findings: No skull fracture is noted.  There is a skin irregularity and soft tissue swelling left frontal scalp.  Subcutaneous hematoma in the frontal region measures 1.2 x 2.5 cm.  No intracranial hemorrhage, mass effect or midline shift.  No acute infarction.  No mass lesion is noted on this unenhanced scan.  Small lacunar infarct is noted in the left pons.  IMPRESSION: No acute intracranial abnormality.  There is soft tissue swelling subcutaneous stranding and subcutaneous hematoma in the left frontal region.  Hematoma measures 2.5 x 1.2 cm.  CT cervical spine without IV contrast:  Axial images of the cervical spine shows no acute fracture or subluxation.  There is disc space flattening with disc calcifications at C5-C6 level.  Mild anterior and mild posterior spurring at C5-C6 level.  Anterior spurring noted lower endplate of the C4 vertebral body.  Mild disc space flattening with mild anterior spurring and mild posterior spurring at C6-C7 level.  Mild anterior spurring lower endplate  of the C7 vertebral body.  No prevertebral soft tissue swelling.  Cervical airway is patent.  There is no pneumothorax in visualized lung apices.  Impression: 1.  No acute fracture or subluxation.  Degenerative changes as described above.  Original Report Authenticated By: Natasha Mead, M.D.    Date: 03/18/2012  Rate: 80  Rhythm: normal sinus rhythm  QRS Axis: normal  Intervals: normal  ST/T Wave  abnormalities: nonspecific T wave changes  Conduction Disutrbances:none  Narrative Interpretation: Q waves anteriorly  Old EKG Reviewed: unchanged  LACERATION REPAIR Performed by: Lottie Mussel Authorized by: Jaynie Crumble A Consent: Verbal consent obtained. Risks and benefits: risks, benefits and alternatives were discussed Consent given by: patient Patient identity confirmed: provided demographic data Prepped and Draped in normal sterile fashion Wound explored  Laceration Location: left forehead  Laceration Length: 4cm  No Foreign Bodies seen or palpated  Anesthesia: local infiltration  Local anesthetic: lidocaine 2% w/ epinephrine  Anesthetic total: 3 ml  Irrigation method: syringe Amount of cleaning: standard  Skin closure: prolene 5.0  Number of sutures: 7  Technique: simple interrupted  Patient tolerance: Patient tolerated the procedure well with no immediate complications.   3:13 PM Potassium 5.6 Pt is a renal transplant pt in 2012. I spoke with his nephrologist, Dr. Caryn Section, about elevated creatinine of 1.6 and BUN of 43, as well as his elevated potassium. Per Dr. Caryn Section, rehydrate, recheck labs tomorrow. Pt has follow up with Dr. Caryn Section next week. Pt and family OK with the plan. Pt rehydrated with IV NS, pt is eating and drinking in ER. Laceration repaired. ECG with no symptoms.    1. Fall   2. Laceration of forehead   3. Traumatic hematoma of forehead   4. Hyperkalemia   5. Renal insufficiency       MDM  Pt with mechanical fall, head injury. CT negative. Elevated potassium and BUN/Creatinine. Per Dr. Caryn Section, rehydrate, recheck tomorrow. Pt in no distress. No ECG changes. He wants to go home. Will d/c home.        Lottie Mussel, PA 03/18/12 1521

## 2012-03-18 NOTE — ED Notes (Signed)
Per EMS pt from [facility], pt fell from standing position- unsure what he hit when he fell. Bleeding controlled. Laceration to forehead. Aspirin OTC. CBG 280- given coverage. No LOC. Alert and oriented x 4.

## 2012-03-18 NOTE — ED Notes (Signed)
[facility] contact for transport 

## 2012-03-19 NOTE — ED Provider Notes (Signed)
Medical screening examination/treatment/procedure(s) were performed by non-physician practitioner and as supervising physician I was immediately available for consultation/collaboration.  Juliet Rude. Rubin Payor, MD 03/19/12 1556

## 2012-03-25 ENCOUNTER — Emergency Department (HOSPITAL_COMMUNITY): Payer: Medicare Other

## 2012-03-25 ENCOUNTER — Inpatient Hospital Stay (HOSPITAL_COMMUNITY)
Admission: EM | Admit: 2012-03-25 | Discharge: 2012-03-29 | DRG: 682 | Disposition: A | Discharge status: Skilled Nursing Facility | Payer: Medicare Other | Attending: Internal Medicine | Admitting: Internal Medicine

## 2012-03-25 ENCOUNTER — Encounter (HOSPITAL_COMMUNITY): Payer: Self-pay | Admitting: *Deleted

## 2012-03-25 DIAGNOSIS — I69959 Hemiplegia and hemiparesis following unspecified cerebrovascular disease affecting unspecified side: Secondary | ICD-10-CM

## 2012-03-25 DIAGNOSIS — L8990 Pressure ulcer of unspecified site, unspecified stage: Secondary | ICD-10-CM | POA: Diagnosis present

## 2012-03-25 DIAGNOSIS — N183 Chronic kidney disease, stage 3 unspecified: Secondary | ICD-10-CM | POA: Diagnosis present

## 2012-03-25 DIAGNOSIS — I6529 Occlusion and stenosis of unspecified carotid artery: Secondary | ICD-10-CM | POA: Diagnosis present

## 2012-03-25 DIAGNOSIS — E860 Dehydration: Secondary | ICD-10-CM

## 2012-03-25 DIAGNOSIS — G9341 Metabolic encephalopathy: Secondary | ICD-10-CM

## 2012-03-25 DIAGNOSIS — N390 Urinary tract infection, site not specified: Secondary | ICD-10-CM

## 2012-03-25 DIAGNOSIS — E1139 Type 2 diabetes mellitus with other diabetic ophthalmic complication: Secondary | ICD-10-CM | POA: Diagnosis present

## 2012-03-25 DIAGNOSIS — G92 Toxic encephalopathy: Secondary | ICD-10-CM | POA: Diagnosis present

## 2012-03-25 DIAGNOSIS — D649 Anemia, unspecified: Secondary | ICD-10-CM | POA: Diagnosis present

## 2012-03-25 DIAGNOSIS — E119 Type 2 diabetes mellitus without complications: Secondary | ICD-10-CM

## 2012-03-25 DIAGNOSIS — I350 Nonrheumatic aortic (valve) stenosis: Secondary | ICD-10-CM

## 2012-03-25 DIAGNOSIS — M1710 Unilateral primary osteoarthritis, unspecified knee: Secondary | ICD-10-CM

## 2012-03-25 DIAGNOSIS — I5033 Acute on chronic diastolic (congestive) heart failure: Secondary | ICD-10-CM | POA: Diagnosis present

## 2012-03-25 DIAGNOSIS — E11319 Type 2 diabetes mellitus with unspecified diabetic retinopathy without macular edema: Secondary | ICD-10-CM | POA: Diagnosis present

## 2012-03-25 DIAGNOSIS — Z8679 Personal history of other diseases of the circulatory system: Secondary | ICD-10-CM

## 2012-03-25 DIAGNOSIS — N058 Unspecified nephritic syndrome with other morphologic changes: Secondary | ICD-10-CM | POA: Diagnosis present

## 2012-03-25 DIAGNOSIS — Z8719 Personal history of other diseases of the digestive system: Secondary | ICD-10-CM

## 2012-03-25 DIAGNOSIS — R627 Adult failure to thrive: Secondary | ICD-10-CM

## 2012-03-25 DIAGNOSIS — Z794 Long term (current) use of insulin: Secondary | ICD-10-CM

## 2012-03-25 DIAGNOSIS — Z79899 Other long term (current) drug therapy: Secondary | ICD-10-CM

## 2012-03-25 DIAGNOSIS — E875 Hyperkalemia: Secondary | ICD-10-CM

## 2012-03-25 DIAGNOSIS — I359 Nonrheumatic aortic valve disorder, unspecified: Secondary | ICD-10-CM | POA: Diagnosis present

## 2012-03-25 DIAGNOSIS — Z87891 Personal history of nicotine dependence: Secondary | ICD-10-CM

## 2012-03-25 DIAGNOSIS — Y92129 Unspecified place in nursing home as the place of occurrence of the external cause: Secondary | ICD-10-CM

## 2012-03-25 DIAGNOSIS — G929 Unspecified toxic encephalopathy: Secondary | ICD-10-CM | POA: Diagnosis present

## 2012-03-25 DIAGNOSIS — E1129 Type 2 diabetes mellitus with other diabetic kidney complication: Secondary | ICD-10-CM | POA: Diagnosis present

## 2012-03-25 DIAGNOSIS — I129 Hypertensive chronic kidney disease with stage 1 through stage 4 chronic kidney disease, or unspecified chronic kidney disease: Secondary | ICD-10-CM | POA: Diagnosis present

## 2012-03-25 DIAGNOSIS — I639 Cerebral infarction, unspecified: Secondary | ICD-10-CM

## 2012-03-25 DIAGNOSIS — W19XXXA Unspecified fall, initial encounter: Secondary | ICD-10-CM

## 2012-03-25 DIAGNOSIS — M48061 Spinal stenosis, lumbar region without neurogenic claudication: Secondary | ICD-10-CM | POA: Diagnosis present

## 2012-03-25 DIAGNOSIS — N289 Disorder of kidney and ureter, unspecified: Secondary | ICD-10-CM

## 2012-03-25 DIAGNOSIS — Z8673 Personal history of transient ischemic attack (TIA), and cerebral infarction without residual deficits: Secondary | ICD-10-CM

## 2012-03-25 DIAGNOSIS — Z8546 Personal history of malignant neoplasm of prostate: Secondary | ICD-10-CM

## 2012-03-25 DIAGNOSIS — R531 Weakness: Secondary | ICD-10-CM

## 2012-03-25 DIAGNOSIS — Z940 Kidney transplant status: Secondary | ICD-10-CM

## 2012-03-25 DIAGNOSIS — I5032 Chronic diastolic (congestive) heart failure: Secondary | ICD-10-CM

## 2012-03-25 DIAGNOSIS — L89109 Pressure ulcer of unspecified part of back, unspecified stage: Secondary | ICD-10-CM | POA: Diagnosis present

## 2012-03-25 DIAGNOSIS — N179 Acute kidney failure, unspecified: Principal | ICD-10-CM

## 2012-03-25 DIAGNOSIS — Z7902 Long term (current) use of antithrombotics/antiplatelets: Secondary | ICD-10-CM

## 2012-03-25 DIAGNOSIS — C61 Malignant neoplasm of prostate: Secondary | ICD-10-CM

## 2012-03-25 DIAGNOSIS — M179 Osteoarthritis of knee, unspecified: Secondary | ICD-10-CM

## 2012-03-25 LAB — CBC WITH DIFFERENTIAL/PLATELET
Eosinophils Absolute: 0.1 10*3/uL (ref 0.0–0.7)
Lymphocytes Relative: 14 % (ref 12–46)
Lymphs Abs: 1.2 10*3/uL (ref 0.7–4.0)
MCH: 26.9 pg (ref 26.0–34.0)
Neutro Abs: 6.3 10*3/uL (ref 1.7–7.7)
Neutrophils Relative %: 77 % (ref 43–77)
Platelets: 233 10*3/uL (ref 150–400)
RBC: 3.98 MIL/uL — ABNORMAL LOW (ref 4.22–5.81)
WBC: 8.2 10*3/uL (ref 4.0–10.5)

## 2012-03-25 LAB — URINALYSIS, ROUTINE W REFLEX MICROSCOPIC
Nitrite: NEGATIVE
Specific Gravity, Urine: 1.012 (ref 1.005–1.030)
Urobilinogen, UA: 0.2 mg/dL (ref 0.0–1.0)

## 2012-03-25 LAB — COMPREHENSIVE METABOLIC PANEL
ALT: 13 U/L (ref 0–53)
Alkaline Phosphatase: 64 U/L (ref 39–117)
GFR calc Af Amer: 38 mL/min — ABNORMAL LOW (ref >90)
Glucose, Bld: 342 mg/dL — ABNORMAL HIGH (ref 70–99)
Potassium: 5.8 mEq/L — ABNORMAL HIGH (ref 3.5–5.1)
Sodium: 135 mEq/L (ref 135–145)
Total Protein: 7.3 g/dL (ref 6.0–8.3)

## 2012-03-25 LAB — URINE MICROSCOPIC-ADD ON

## 2012-03-25 LAB — CBC
MCH: 26 pg (ref 26.0–34.0)
MCHC: 34 g/dL (ref 30.0–36.0)
MCV: 76.6 fL — ABNORMAL LOW (ref 78.0–100.0)
Platelets: 245 10*3/uL (ref 150–400)
RBC: 4.11 MIL/uL — ABNORMAL LOW (ref 4.22–5.81)
RDW: 14.1 % (ref 11.5–15.5)

## 2012-03-25 LAB — GLUCOSE, CAPILLARY: Glucose-Capillary: 166 mg/dL — ABNORMAL HIGH (ref 70–99)

## 2012-03-25 LAB — CREATININE, SERUM: Creatinine, Ser: 1.66 mg/dL — ABNORMAL HIGH (ref 0.50–1.35)

## 2012-03-25 MED ORDER — POLYETHYLENE GLYCOL 3350 17 G PO PACK
17.0000 g | PACK | Freq: Every day | ORAL | Status: DC | PRN
  Filled 2012-03-25: qty 1

## 2012-03-25 MED ORDER — TACROLIMUS 1 MG PO CAPS
5.0000 mg | ORAL_CAPSULE | Freq: Two times a day (BID) | ORAL | Status: DC
  Administered 2012-03-25 – 2012-03-29 (×8): 5 mg via ORAL
  Filled 2012-03-25 (×11): qty 5

## 2012-03-25 MED ORDER — INSULIN GLARGINE 100 UNIT/ML ~~LOC~~ SOLN
5.0000 [IU] | Freq: Every day | SUBCUTANEOUS | Status: DC
  Administered 2012-03-25: 5 [IU] via SUBCUTANEOUS

## 2012-03-25 MED ORDER — CLOPIDOGREL BISULFATE 75 MG PO TABS
75.0000 mg | ORAL_TABLET | Freq: Every day | ORAL | Status: DC
  Administered 2012-03-25 – 2012-03-29 (×5): 75 mg via ORAL
  Filled 2012-03-25 (×5): qty 1

## 2012-03-25 MED ORDER — ONDANSETRON HCL 4 MG PO TABS: 4.0000 mg | ORAL_TABLET | Freq: Four times a day (QID) | ORAL | Status: DC | PRN

## 2012-03-25 MED ORDER — INSULIN ASPART 100 UNIT/ML ~~LOC~~ SOLN
0.0000 [IU] | Freq: Three times a day (TID) | SUBCUTANEOUS | Status: DC
  Administered 2012-03-26 (×2): 1 [IU] via SUBCUTANEOUS
  Administered 2012-03-26: 7 [IU] via SUBCUTANEOUS
  Administered 2012-03-27: 2 [IU] via SUBCUTANEOUS

## 2012-03-25 MED ORDER — PANTOPRAZOLE SODIUM 40 MG PO TBEC
40.0000 mg | DELAYED_RELEASE_TABLET | Freq: Every day | ORAL | Status: DC
  Administered 2012-03-25 – 2012-03-26 (×2): 40 mg via ORAL
  Filled 2012-03-25 (×3): qty 1

## 2012-03-25 MED ORDER — INSULIN ASPART 100 UNIT/ML ~~LOC~~ SOLN
10.0000 [IU] | Freq: Once | SUBCUTANEOUS | Status: AC
  Administered 2012-03-25: 10 [IU] via SUBCUTANEOUS
  Filled 2012-03-25: qty 1

## 2012-03-25 MED ORDER — DEXTROSE 5 % IV SOLN
1.0000 g | INTRAVENOUS | Status: DC
  Administered 2012-03-26 – 2012-03-27 (×2): 1 g via INTRAVENOUS
  Filled 2012-03-25 (×3): qty 10

## 2012-03-25 MED ORDER — SODIUM CHLORIDE 0.9 % IV SOLN
INTRAVENOUS | Status: DC
  Administered 2012-03-25 – 2012-03-26 (×3): via INTRAVENOUS

## 2012-03-25 MED ORDER — DILTIAZEM HCL ER BEADS 300 MG PO CP24
300.0000 mg | ORAL_CAPSULE | Freq: Every day | ORAL | Status: DC
Start: 2012-03-25 — End: 2012-03-29
  Administered 2012-03-25 – 2012-03-29 (×5): 300 mg via ORAL
  Filled 2012-03-25 (×5): qty 1

## 2012-03-25 MED ORDER — ACETAMINOPHEN 650 MG RE SUPP: 650.0000 mg | Freq: Four times a day (QID) | RECTAL | Status: DC | PRN

## 2012-03-25 MED ORDER — HYDRALAZINE HCL 25 MG PO TABS
25.0000 mg | ORAL_TABLET | Freq: Two times a day (BID) | ORAL | Status: DC
  Administered 2012-03-25 – 2012-03-28 (×6): 25 mg via ORAL
  Filled 2012-03-25 (×7): qty 1

## 2012-03-25 MED ORDER — ENOXAPARIN SODIUM 30 MG/0.3ML ~~LOC~~ SOLN
30.0000 mg | SUBCUTANEOUS | Status: DC
  Administered 2012-03-25: 30 mg via SUBCUTANEOUS
  Filled 2012-03-25 (×2): qty 0.3

## 2012-03-25 MED ORDER — DEXTROSE 5 % IV SOLN
1.0000 g | Freq: Once | INTRAVENOUS | Status: DC
  Administered 2012-03-25: 1 g via INTRAVENOUS
  Filled 2012-03-25: qty 10

## 2012-03-25 MED ORDER — ONDANSETRON HCL 4 MG/2ML IJ SOLN
4.0000 mg | Freq: Four times a day (QID) | INTRAMUSCULAR | Status: DC | PRN
  Filled 2012-03-25: qty 2

## 2012-03-25 MED ORDER — ACETAMINOPHEN 325 MG PO TABS
650.0000 mg | ORAL_TABLET | Freq: Four times a day (QID) | ORAL | Status: DC | PRN
Start: 2012-03-25 — End: 2012-03-29

## 2012-03-25 MED ORDER — CALCIUM CARBONATE ANTACID 500 MG PO CHEW
1.5000 | CHEWABLE_TABLET | Freq: Three times a day (TID) | ORAL | Status: DC
  Administered 2012-03-26 (×2): 300 mg via ORAL
  Filled 2012-03-25 (×5): qty 1.5

## 2012-03-25 MED ORDER — SODIUM CHLORIDE 0.9 % IV BOLUS (SEPSIS)
500.0000 mL | Freq: Once | INTRAVENOUS | Status: AC
  Administered 2012-03-25: 500 mL via INTRAVENOUS

## 2012-03-25 MED ORDER — SODIUM POLYSTYRENE SULFONATE 15 GM/60ML PO SUSP
15.0000 g | Freq: Once | ORAL | Status: DC
  Filled 2012-03-25: qty 60

## 2012-03-25 MED ORDER — MYCOPHENOLATE SODIUM 180 MG PO TBEC
180.0000 mg | DELAYED_RELEASE_TABLET | Freq: Two times a day (BID) | ORAL | Status: DC
  Administered 2012-03-25 – 2012-03-29 (×8): 180 mg via ORAL
  Filled 2012-03-25 (×10): qty 1

## 2012-03-25 NOTE — ED Notes (Signed)
Per EMS- pt was seen 5 days ago for a fall. Family states that he has had increased weakness since the fall. No neauro deficits with EMS. Pt does have rt sided weakness since previous stroke. Pt vitals stable with EMS. CBG 288

## 2012-03-25 NOTE — ED Notes (Signed)
Verbal order received from Dr. Christain Sacramento authorizing IV placement in the foot.

## 2012-03-25 NOTE — Progress Notes (Signed)
Disposition Note  Evan [name], is a 78 y.o. male,   MRN: [id number]  -  DOB - 07/06/1932  Outpatient Primary MD for the patient is Laurena Slimmer, MD   Blood pressure 174/53, temperature 98 F (36.7 C), temperature source Oral.  Principal Problem:  *Acute UTI (lower urinary tract infection)  Active Problems:  History of renal transplant  Hyperkalemia  Metabolic encephalopathy  Acute renal failure  Diabetes mellitus type II  History of CVA (cerebrovascular accident)/left pontine infarct June 20,2013  Dehydration  FTT (failure to thrive) in adult  H/O atrial fibrillation without current medication  Aortic stenosis  Chronic diastolic heart failure, NYHA class 2  H/O carotid stenosis <60%  Fall at nursing home- 7 days ago  An elderly gentleman recently hospitalized after sustaining a left pontine infarct 01/24/2012. Underwent rehabilitation at [facility] but unfortunately ended up being discharged to [facility]. Last outpatient visit with Dr. Hermelinda Medicus was 03/04/2012 at which time he was noted to have symptoms of failure to thrive and poor oral intake and was deemed not ready to return to the home environment with his wife. He presented to the emergency department one week ago after a fall but was discharged back to [facility]. He presents back to [facility] ER with altered mental status and weakness. Was found to be in acute renal failure with hyperkalemia and a urinalysis was consistent with a urinary tract infection. During his last hospitalization and inpatient rehabilitation stay patient had 2 urinary tract infections: one with Escherichia coli and the other with Citrobacter. The Citrobacter was multidrug resistant and only sensitive to Rocephin and tobramycin. Treatments received in the ER included a 500 cc saline bolus for presumed dehydration as well as insulin for hyperglycemia-CBG 342 on electrolyte panel. Patient's creatinine was 1.88 with a BUN of 48 and CO2 is slightly low at 18  indicative of metabolic acidosis from renal failure. GFR also low at 33. Please note that back in June patient had a normal creatinine baseline of 1.16 with a GFR greater than 60. Please note this patient underwent renal transplantation in March of 2012 and is on immunosuppressive medications. On previous admissions patient was listed as a full resuscitation. The emergency room has also initiated  Rocephin for the presumed urinary tract infection. I discussed with the emergency room physician assistant and plans are to admit the patient to a telemetry bed for further monitoring given his hyperkalemia and acute renal failure which will likely resolve with adequate rehydration. I notified the flow manager of this bed request. I have asked the flow manager to contact the appropriate admitting physician so I can make them aware of the patient's hyperkalemia in the event of a blood wish to institute treatment for this such as Kayexalate or cardioprotective IV medications such as 50% dextrose with IV insulin and calcium gluconate. Please note I have not interviewed or examined this patient.  Junious Silk, ANP

## 2012-03-25 NOTE — ED Notes (Signed)
PA aware that 2 RNs and IV team were not able to establish IV.

## 2012-03-25 NOTE — ED Provider Notes (Signed)
History     CSN: [id number]  Arrival date & time 03/25/12  1343   First MD Initiated Contact with Patient 03/25/12 1400      Chief Complaint  Patient presents with  . Weakness    (Consider location/radiation/quality/duration/timing/severity/associated sxs/prior treatment) HPI  78 y.o. insulin-dependent diabetic male status post functioning renal transplant in no acute distress complaining of generalized weakness worsening over the course of 7 days after a fall. Patient is accompanied by his wife who explains that he had a stroke several months ago and is now in physical therapy he ambulates with a walker but he's been unable to do so and must use a wheelchair she also reports that he is somnolent. She reports his mentation is at his baseline.  Past Medical History  Diagnosis Date  . H/O kidney transplant   . Diabetes mellitus   . Cancer     hx prostate cancer  . ESRD (end stage renal disease) 2011    hemodialysis started Sept 2011, cadaveric (double) renal  transplant Mar 2012 at [facility]  . Stroke     Past Surgical History  Procedure Date  . Kidney transplant   . Prostate surgery approx. 2003    seed implant    No family history on file.  History  Substance Use Topics  . Smoking status: Former Smoker -- 15 years    Types: Cigarettes    Quit date: 01/24/1983  . Smokeless tobacco: Never Used  . Alcohol Use: No      Review of Systems  Constitutional: Negative for fatigue.  Neurological: Positive for weakness.  All other systems reviewed and are negative.    Allergies  Review of patient's allergies indicates no known allergies.  Home Medications   Current Outpatient Rx  Name Route Sig Dispense Refill  . ACETAMINOPHEN 500 MG PO TABS Oral Take 1,000 mg by mouth every 6 (six) hours as needed. For pain    . BACLOFEN 20 MG PO TABS Oral Take 20 mg by mouth 2 (two) times daily as needed. For hiccups    . CALCIUM CARBONATE ANTACID 750 MG PO CHEW Oral Chew 1 tablet by  mouth 3 (three) times daily.    Marland Kitchen CLOPIDOGREL BISULFATE 75 MG PO TABS Oral Take 75 mg by mouth daily.    Marland Kitchen DILTIAZEM HCL ER BEADS 300 MG PO CP24 Oral Take 300 mg by mouth daily.     . FUROSEMIDE 20 MG PO TABS Oral Take 10 mg by mouth daily.    Marland Kitchen GLIPIZIDE 10 MG PO TABS Oral Take 10 mg by mouth 2 (two) times daily before a meal.    . HYDRALAZINE HCL 25 MG PO TABS Oral Take 25 mg by mouth 2 (two) times daily.    . INSULIN ASPART 100 UNIT/ML [place] SOLN Subcutaneous Inject 5-10 Units into the skin 3 (three) times daily before meals. cbg 150-300 5 units; 301-400 8 units; 401-500 10 units    . INSULIN GLARGINE 100 UNIT/ML [place] SOLN Subcutaneous Inject 5 Units into the skin at bedtime. CBG Over 200 5 units    . LORATADINE 10 MG PO TABS Oral Take 10 mg by mouth daily as needed. allergies    . MEGESTROL ACETATE 400 MG/10ML PO SUSP Oral Take 400 mg by mouth daily as needed. For appetite    . MYCOPHENOLATE SODIUM 180 MG PO TBEC Oral Take 180 mg by mouth 2 (two) times daily.    Marland Kitchen PANTOPRAZOLE SODIUM 40 MG PO TBEC Oral Take 40  mg by mouth daily.    Marland Kitchen POLYETHYLENE GLYCOL 3350 PO PACK Oral Take 17 g by mouth daily as needed. constipation    . SODIUM BICARBONATE 650 MG PO TABS Oral Take 1,300 mg by mouth 2 (two) times daily.    . SULFAMETHOXAZOLE-TRIMETHOPRIM 400-80 MG PO TABS Oral Take 1 tablet by mouth every Monday, Wednesday, and Friday.    Marland Kitchen TACROLIMUS 5 MG PO CAPS Oral Take 5 mg by mouth 2 (two) times daily.      BP 174/53  Temp 98 F (36.7 C) (Oral)  Physical Exam  Nursing note and vitals reviewed. Constitutional: He is oriented to person, place, and time. No distress.       Thin and frail  HENT:  Head: Normocephalic.  Right Ear: External ear normal.       Well-healing laceration above right eyebrow with mild soft tissue swelling,  no signs of infection. No facial droop  Eyes: Conjunctivae and EOM are normal. Pupils are equal, round, and reactive to light.  Neck: Normal range of motion. Neck supple.  No JVD present.  Cardiovascular: Normal rate, regular rhythm and normal heart sounds.   Pulmonary/Chest: Effort normal and breath sounds normal.  Abdominal: Soft. Bowel sounds are normal.  Genitourinary:       Incontinent and wears a diaper  Musculoskeletal: Normal range of motion. He exhibits no edema.  Neurological: He is alert and oriented to person, place, and time.       Follows directions. Strength 4/5 on upper and lower right extremity. Otherwise cranial nerves III through XII are intact.  Skin: Skin is warm and dry. He is not diaphoretic.  Psychiatric: He has a normal mood and affect.       Patient is confused    ED Course  Procedures (including critical care time)  Labs Reviewed  CBC WITH DIFFERENTIAL - Abnormal; Notable for the following:    RBC 3.98 (*)     Hemoglobin 10.7 (*)     HCT 31.2 (*)     All other components within normal limits  COMPREHENSIVE METABOLIC PANEL - Abnormal; Notable for the following:    Potassium 5.8 (*)     CO2 18 (*)     Glucose, Bld 342 (*)     BUN 48 (*)     Creatinine, Ser 1.88 (*)     Albumin 3.3 (*)     Total Bilirubin 0.2 (*)     GFR calc non Af Amer 33 (*)     GFR calc Af Amer 38 (*)     All other components within normal limits  POCT I-STAT TROPONIN I  URINALYSIS, ROUTINE W REFLEX MICROSCOPIC   Dg Chest 1 View  03/25/2012  *RADIOLOGY REPORT*  Clinical Data: 78 year old male with weakness and history of stroke.  CHEST - 1 VIEW  Comparison: 02/14/2012 and earlier.  Findings: AP view at 1457 hours.  Streaky retrocardiac opacity. Somewhat lower lung volumes overall.  Stable right lung base.  No pneumothorax, pulmonary edema or pleural effusion.  Cardiac size and mediastinal contours are within normal limits.  Visualized tracheal air column is within normal limits.  IMPRESSION: Lower lung volumes.  Streaky retrocardiac opacity could reflect atelectasis or infection/aspiration.   Original Report Authenticated By: Harley Hallmark, M.D.     Ct Head Wo Contrast  03/25/2012  *RADIOLOGY REPORT*  Clinical Data: Fall, increased weakness  CT HEAD WITHOUT CONTRAST  Technique:  Contiguous axial images were obtained from the base of the skull  through the vertex without contrast.  Comparison: 03/18/2012  Findings: Interval decrease in size of the left frontal scalp hematoma.  No underlying fracture.  Mild brain atrophy and chronic microvascular white matter ischemic changes.  No acute intracranial hemorrhage, mass lesion, definite infarction, midline shift, herniation, hydrocephalus, or extra-axial fluid collection.  Gray- white matter differentiation maintained.  Cisterns patent.  No cerebellar abnormality.  Orbits are symmetric.  Mastoids and sinuses remain clear.  No depressed skull fracture.  IMPRESSION: Resolving left frontal scalp hematoma.  No acute intracranial finding.  Stable atrophy.   Original Report Authenticated By: Judie Petit. Ruel Favors, M.D.      1. Weakness generalized   2. Hyperkalemia, diminished renal excretion   3. Renal insufficiency   4. UTI (lower urinary tract infection)      Date: 03/25/2012  Rate: 84  Rhythm: normal sinus rhythm  QRS Axis: normal  Intervals: normal  ST/T Wave abnormalities: nonspecific ST/T changes  Conduction Disutrbances:none  Narrative Interpretation:   Old EKG Reviewed: unchanged    MDM  78 year old insulin-dependent diabetic male status post CVA several months ago with fall several days ago complaining of weakness x2days.   This patient is immunosuppressed secondary to renal transplant. I will perform a generalized infectious disease workup. Chest x-ray shows streaky retrocardiac opacity radiologist read as atelectasis versus infection aspiration.  Potassium is elevated even for him at his baseline today is 5.8 versus 5.6 7 days ago. Which is his baseline he has no EKG changes, therefore, I will not try to correct his potassium level. His creatinine is 1.88 which is an increase from 1.6 7  days ago. UA shows a significant leukocytosis, I will treat with 1g Rocephin IV and send Cx.   I personally reviewed the imaging tests through PACS system  I reviewed available ER/hospitalization records thought the EMR  Patient will be admitted to telemetry under Dr. Rito Ehrlich [facility]. I will let the patient's nephrologist Dr. Caryn Section know that he will be admitted. I spoke to associate of Dr. Caryn Section and advised them that Mr. Carollee Sires will be admitted to the telemetry floor I advised them of his rising creatinine and potassium he agreed with care plan and are aware of his admission.   Wynetta Emery, PA-C 03/25/12 1718

## 2012-03-25 NOTE — H&P (Addendum)
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 He presented to the emergency department one week ago after a fall but was discharged back to [facility]. He presents back to [facility] ER with lethargy and weakness noted today at SNF, his wife thinks that he has not been as interactive and alert for last 3-4 days. He was started on bactrim for UTI 3-4 days ago at [facility]. Upon evaluation in ER he was found to have slight worsening of his renal failure, hyperkalemia and a urinalysis was consistent with a urinary tract infection. During his last hospitalization and inpatient rehabilitation stay patient had 2 urinary tract infections   Allergies:  No Known Allergies    Past Medical History  Diagnosis Date  . H/O kidney transplant   . Diabetes mellitus   . Cancer     hx prostate cancer  . ESRD (end stage renal disease) 2011    hemodialysis started Sept 2011, cadaveric (double) renal  transplant Mar 2012 at [facility]  . Stroke     Past Surgical History  Procedure Date  . Kidney transplant   . Prostate surgery approx. 2003    seed implant    Prior to Admission medications   Medication Sig Start Date End Date Taking? Authorizing Provider  acetaminophen (TYLENOL) 500 MG tablet Take 1,000 mg by mouth every 6 (six) hours as needed. For pain   Yes Historical Provider, MD  baclofen (LIORESAL) 20 MG tablet Take 20 mg by mouth 2 (two) times daily as needed. For hiccups   Yes Historical Provider, MD  calcium carbonate (TUMS EX) 750 MG chewable tablet Chew 1 tablet by mouth 3 (three) times daily.   Yes Historical Provider, MD  clopidogrel (PLAVIX) 75 MG tablet Take 75 mg by mouth daily.   Yes Historical Provider, MD  diltiazem (TIAZAC) 300 MG 24 hr capsule Take 300 mg by mouth daily.    Yes Historical Provider, MD   furosemide (LASIX) 20 MG tablet Take 10 mg by mouth daily.   Yes Historical Provider, MD  glipiZIDE (GLUCOTROL) 10 MG tablet Take 10 mg by mouth 2 (two) times daily before a meal.   Yes Historical Provider, MD  hydrALAZINE (APRESOLINE) 25 MG tablet Take 25 mg by mouth 2 (two) times daily.   Yes Historical Provider, MD  insulin aspart (NOVOLOG) 100 UNIT/ML injection Inject 5-10 Units into the skin 3 (three) times daily before meals. cbg 150-300 5 units; 301-400 8 units; 401-500 10 units   Yes Historical Provider, MD  insulin glargine (LANTUS) 100 UNIT/ML injection Inject 5 Units into the skin at bedtime. CBG Over 200 5 units   Yes Historical Provider, MD  loratadine (CLARITIN) 10 MG tablet Take 10 mg by mouth daily as needed. allergies   Yes Historical Provider, MD  megestrol (MEGACE) 400 MG/10ML suspension Take 400 mg by mouth daily as needed. For appetite   Yes Historical Provider, MD  mycophenolate (MYFORTIC) 180 MG EC tablet Take 180 mg by mouth 2 (two) times daily.   Yes Historical Provider, MD  pantoprazole (PROTONIX) 40 MG tablet Take 40 mg by mouth daily.   Yes Historical Provider, MD  polyethylene glycol (MIRALAX / GLYCOLAX) packet Take 17 g by mouth daily as needed. constipation   Yes Historical Provider, MD  sodium bicarbonate 650 MG tablet Take  1,300 mg by mouth 2 (two) times daily.   Yes Historical Provider, MD  sulfamethoxazole-trimethoprim (BACTRIM,SEPTRA) 400-80 MG per tablet Take 1 tablet by mouth every Monday, Wednesday, and Friday.   Yes Historical Provider, MD  tacrolimus (PROGRAF) 5 MG capsule Take 5 mg by mouth 2 (two) times daily.   Yes Historical Provider, MD    Social History:  reports that he quit smoking about 29 years ago. His smoking use included Cigarettes. He quit after 15 years of use. He has never used smokeless tobacco. He reports that he does not drink alcohol or use illicit drugs.  No family history on file.  Review of Systems:  Constitutional: Denies fever,  chills, diaphoresis, appetite change and fatigue.  HEENT: Denies photophobia, eye pain, redness, hearing loss, ear pain, congestion, sore throat, rhinorrhea, sneezing, mouth sores, trouble swallowing, neck pain, neck stiffness and tinnitus.   Respiratory: Denies SOB, DOE, cough, chest tightness,  and wheezing.   Cardiovascular: Denies chest pain, palpitations and leg swelling.  Gastrointestinal: Denies nausea, vomiting, abdominal pain, diarrhea, constipation, blood in stool and abdominal distention.  Genitourinary: Denies dysuria, urgency, frequency, hematuria, flank pain and difficulty urinating.  Musculoskeletal: Denies myalgias, back pain, joint swelling, arthralgias and gait problem.  Skin: Denies pallor, rash and wound.  Neurological: Denies dizziness, seizures, syncope, weakness, light-headedness, numbness and headaches.  Hematological: Denies adenopathy. Easy bruising, personal or family bleeding history  Psychiatric/Behavioral: Denies suicidal ideation, mood changes, confusion, nervousness, sleep disturbance and agitation   Physical Exam: Blood pressure 156/55, pulse 80, temperature 98 F (36.7 C), temperature source Oral, resp. rate 14, SpO2 100.00%. Gen: lethargic, awake, oriented to self and place only HEENT: PERRLA, EOMI CVS:S1S2/RRR, ejection systolic murmur Lungs: CTAB Abd: soft, Nt, Bs present Ext: no edema, c/c Neuro: R hemiplegia  SKin: no erythema/rashes  or skin breakdown   Labs on Admission:  Results for orders placed during the hospital encounter of 03/25/12 (from the past 48 hour(s))  CBC WITH DIFFERENTIAL     Status: Abnormal   Collection Time   03/25/12  2:34 PM      Component Value Range Comment   WBC 8.2  4.0 - 10.5 K/uL    RBC 3.98 (*) 4.22 - 5.81 MIL/uL    Hemoglobin 10.7 (*) 13.0 - 17.0 g/dL    HCT 82.9 (*) 56.2 - 52.0 %    MCV 78.4  78.0 - 100.0 fL    MCH 26.9  26.0 - 34.0 pg    MCHC 34.3  30.0 - 36.0 g/dL    RDW 13.0  86.5 - 78.4 %    Platelets  233  150 - 400 K/uL    Neutrophils Relative 77  43 - 77 %    Neutro Abs 6.3  1.7 - 7.7 K/uL    Lymphocytes Relative 14  12 - 46 %    Lymphs Abs 1.2  0.7 - 4.0 K/uL    Monocytes Relative 8  3 - 12 %    Monocytes Absolute 0.6  0.1 - 1.0 K/uL    Eosinophils Relative 1  0 - 5 %    Eosinophils Absolute 0.1  0.0 - 0.7 K/uL    Basophils Relative 0  0 - 1 %    Basophils Absolute 0.0  0.0 - 0.1 K/uL   COMPREHENSIVE METABOLIC PANEL     Status: Abnormal   Collection Time   03/25/12  2:34 PM      Component Value Range Comment   Sodium 135  135 -  145 mEq/L    Potassium 5.8 (*) 3.5 - 5.1 mEq/L    Chloride 106  96 - 112 mEq/L    CO2 18 (*) 19 - 32 mEq/L    Glucose, Bld 342 (*) 70 - 99 mg/dL    BUN 48 (*) 6 - 23 mg/dL    Creatinine, Ser 4.09 (*) 0.50 - 1.35 mg/dL    Calcium 9.2  8.4 - 81.1 mg/dL    Total Protein 7.3  6.0 - 8.3 g/dL    Albumin 3.3 (*) 3.5 - 5.2 g/dL    AST 12  0 - 37 U/L    ALT 13  0 - 53 U/L    Alkaline Phosphatase 64  39 - 117 U/L    Total Bilirubin 0.2 (*) 0.3 - 1.2 mg/dL    GFR calc non Af Amer 33 (*) >90 mL/min    GFR calc Af Amer 38 (*) >90 mL/min   POCT I-STAT TROPONIN I     Status: Normal   Collection Time   03/25/12  2:55 PM      Component Value Range Comment   Troponin i, poc 0.02  0.00 - 0.08 ng/mL    Comment 3            URINALYSIS, ROUTINE W REFLEX MICROSCOPIC     Status: Abnormal   Collection Time   03/25/12  3:52 PM      Component Value Range Comment   Color, Urine YELLOW  YELLOW    APPearance TURBID (*) CLEAR    Specific Gravity, Urine 1.012  1.005 - 1.030    pH 7.0  5.0 - 8.0    Glucose, UA 500 (*) NEGATIVE mg/dL    Hgb urine dipstick MODERATE (*) NEGATIVE    Bilirubin Urine NEGATIVE  NEGATIVE    Ketones, ur NEGATIVE  NEGATIVE mg/dL    Protein, ur 914 (*) NEGATIVE mg/dL    Urobilinogen, UA 0.2  0.0 - 1.0 mg/dL    Nitrite NEGATIVE  NEGATIVE    Leukocytes, UA LARGE (*) NEGATIVE   URINE MICROSCOPIC-ADD ON     Status: Abnormal   Collection Time    03/25/12  3:52 PM      Component Value Range Comment   Squamous Epithelial / LPF FEW (*) RARE    WBC, UA TOO NUMEROUS TO COUNT  <3 WBC/hpf    RBC / HPF 0-2  <3 RBC/hpf    Bacteria, UA RARE  RARE   GLUCOSE, CAPILLARY     Status: Abnormal   Collection Time   03/25/12  4:30 PM      Component Value Range Comment   Glucose-Capillary 294 (*) 70 - 99 mg/dL   GLUCOSE, CAPILLARY     Status: Abnormal   Collection Time   03/25/12  6:04 PM      Component Value Range Comment   Glucose-Capillary 231 (*) 70 - 99 mg/dL     Radiological Exams on Admission: Dg Chest 1 View  03/25/2012  *RADIOLOGY REPORT*  Clinical Data: 77 year old male with weakness and history of stroke.  CHEST - 1 VIEW  Comparison: 02/14/2012 and earlier.  Findings: AP view at 1457 hours.  Streaky retrocardiac opacity. Somewhat lower lung volumes overall.  Stable right lung base.  No pneumothorax, pulmonary edema or pleural effusion.  Cardiac size and mediastinal contours are within normal limits.  Visualized tracheal air column is within normal limits.  IMPRESSION: Lower lung volumes.  Streaky retrocardiac opacity could reflect atelectasis or infection/aspiration.  Original Report Authenticated By: Harley Hallmark, M.D.    Ct Head Wo Contrast  03/25/2012  *RADIOLOGY REPORT*  Clinical Data: Fall, increased weakness  CT HEAD WITHOUT CONTRAST  Technique:  Contiguous axial images were obtained from the base of the skull through the vertex without contrast.  Comparison: 03/18/2012  Findings: Interval decrease in size of the left frontal scalp hematoma.  No underlying fracture.  Mild brain atrophy and chronic microvascular white matter ischemic changes.  No acute intracranial hemorrhage, mass lesion, definite infarction, midline shift, herniation, hydrocephalus, or extra-axial fluid collection.  Gray- white matter differentiation maintained.  Cisterns patent.  No cerebellar abnormality.  Orbits are symmetric.  Mastoids and sinuses remain clear.  No  depressed skull fracture.  IMPRESSION: Resolving left frontal scalp hematoma.  No acute intracranial finding.  Stable atrophy.   Original Report Authenticated By: Judie Petit. Ruel Favors, M.D.     Assessment/Plan 1.  UTI : continue IV ceftriaxone Prior EColi and Citrobacter UTIs were sensitive to Ceftriaxone FU urine cultures  2. Toxic metabolic encephalopathy secondary to above  3. ARF: on CKD: h/o renal transplant  recent creatinine 1.48(7/6) and last ER visit 1.7(8/13) now 1.8 Suspect hypovolemia bactrim contributing to this Will hydrate with IVF NS at 100cc/hr, stop bactrim Renal notified by EDP today  4. Hyperkalemia: without EKG changes, received insulin and dextrose in ED, kayexalate x1, repeat Bmet  5. Diabetes mellitus type II: continue lantus, SSI, hold glipizide  6. History of CVA (cerebrovascular accident)/left pontine infarct June 20,2013 Continue plavix  7. H/o Renal transplant: continue myfortic and prograf  8. DVT prophylaxis: lovenox   Code status: discussed with wife: FULL CODE     Time Spent on Admission:  Evan [name] [facility] Pager: [phone] 03/25/2012, 6:24 PM

## 2012-03-26 ENCOUNTER — Encounter (HOSPITAL_COMMUNITY): Payer: Self-pay | Admitting: *Deleted

## 2012-03-26 ENCOUNTER — Inpatient Hospital Stay (HOSPITAL_COMMUNITY): Payer: Medicare Other

## 2012-03-26 DIAGNOSIS — I5032 Chronic diastolic (congestive) heart failure: Secondary | ICD-10-CM

## 2012-03-26 DIAGNOSIS — Z940 Kidney transplant status: Secondary | ICD-10-CM

## 2012-03-26 DIAGNOSIS — E119 Type 2 diabetes mellitus without complications: Secondary | ICD-10-CM

## 2012-03-26 DIAGNOSIS — R5383 Other fatigue: Secondary | ICD-10-CM

## 2012-03-26 DIAGNOSIS — R5381 Other malaise: Secondary | ICD-10-CM

## 2012-03-26 LAB — BASIC METABOLIC PANEL
BUN: 38 mg/dL — ABNORMAL HIGH (ref 6–23)
CO2: 15 mEq/L — ABNORMAL LOW (ref 19–32)
Calcium: 9.2 mg/dL (ref 8.4–10.5)
Creatinine, Ser: 1.68 mg/dL — ABNORMAL HIGH (ref 0.50–1.35)
GFR calc non Af Amer: 37 mL/min — ABNORMAL LOW (ref >90)
Glucose, Bld: 134 mg/dL — ABNORMAL HIGH (ref 70–99)

## 2012-03-26 LAB — URINE CULTURE

## 2012-03-26 LAB — GLUCOSE, CAPILLARY
Glucose-Capillary: 132 mg/dL — ABNORMAL HIGH (ref 70–99)
Glucose-Capillary: 130 mg/dL — ABNORMAL HIGH (ref 70–99)

## 2012-03-26 MED ORDER — PANTOPRAZOLE SODIUM 40 MG PO TBEC
40.0000 mg | DELAYED_RELEASE_TABLET | Freq: Every day | ORAL | Status: DC
  Administered 2012-03-27 – 2012-03-28 (×2): 40 mg via ORAL
  Filled 2012-03-26: qty 1

## 2012-03-26 MED ORDER — CALCIUM CARBONATE 1250 (500 CA) MG PO TABS
1.0000 | ORAL_TABLET | Freq: Two times a day (BID) | ORAL | Status: DC
  Administered 2012-03-26 – 2012-03-29 (×7): 500 mg via ORAL
  Filled 2012-03-26 (×7): qty 1

## 2012-03-26 MED ORDER — GLUCERNA SHAKE PO LIQD
237.0000 mL | Freq: Three times a day (TID) | ORAL | Status: DC
  Administered 2012-03-26 – 2012-03-28 (×8): 237 mL via ORAL

## 2012-03-26 MED ORDER — SODIUM POLYSTYRENE SULFONATE 15 GM/60ML PO SUSP
15.0000 g | Freq: Once | ORAL | Status: AC
  Administered 2012-03-26: 15 g via ORAL
  Filled 2012-03-26 (×2): qty 60

## 2012-03-26 MED ORDER — INSULIN GLARGINE 100 UNIT/ML ~~LOC~~ SOLN
20.0000 [IU] | Freq: Every day | SUBCUTANEOUS | Status: DC
  Administered 2012-03-26: 20 [IU] via SUBCUTANEOUS

## 2012-03-26 MED ORDER — BACLOFEN 10 MG PO TABS
10.0000 mg | ORAL_TABLET | Freq: Two times a day (BID) | ORAL | Status: DC
  Filled 2012-03-26: qty 1

## 2012-03-26 MED ORDER — ENOXAPARIN SODIUM 40 MG/0.4ML ~~LOC~~ SOLN
40.0000 mg | SUBCUTANEOUS | Status: DC
  Administered 2012-03-26 – 2012-03-28 (×3): 40 mg via SUBCUTANEOUS
  Filled 2012-03-26 (×4): qty 0.4

## 2012-03-26 MED ORDER — STERILE WATER FOR INJECTION IV SOLN
INTRAVENOUS | Status: DC
  Administered 2012-03-26: 21:00:00 via INTRAVENOUS
  Filled 2012-03-26 (×2): qty 850

## 2012-03-26 NOTE — Evaluation (Signed)
Clinical/Bedside Swallow Evaluation Patient Details  Name: Evan [name] MRN: [id number] Date of Birth: 07-12-1932  Today's Date: 03/26/2012 Time: 1010-1043 SLP Time Calculation (min): 33 min  Past Medical History:  Past Medical History  Diagnosis Date  . H/O kidney transplant   . Diabetes mellitus   . Cancer     hx prostate cancer  . ESRD (end stage renal disease) 2011    hemodialysis started Sept 2011, cadaveric (double) renal  transplant Mar 2012 at [facility]  . Stroke    Past Surgical History:  Past Surgical History  Procedure Date  . Kidney transplant   . Prostate surgery approx. 2003    seed implant   HPI:  Evan [name] is a 80/AA male recently discharged to [facility] place SNF from CIR 7/10 after sustaining a left pontine infarct 01/24/2012 w/ R hemiplegia and dysarthria. He presented to the emergency department one week ago after a fall but was discharged back to [facility]. He presents back to [facility] ER with lethargy and weakness noted today at SNF, his wife thinks that he has not been as interactive and alert for last 3-4 days. He was started on bactrim for UTI 3-4 days ago at [facility]. Upon evaluation in ER he was found to have slight worsening of his renal failure, hyperkalemia and a urinalysis was consistent with a urinary tract infection. During his last hospitalization and inpatient rehabilitation stay patient had 2 urinary tract infections. MBS on 01/30/12 showed flash penetration of thin liquids, reduced with small single sips. Upon d/c pt on a regular diet with thin liquids. Wife reports pt was doing well at SNF, but after fall he continued to weaken. Needed full supervision with meals.    Assessment / Plan / Recommendation Clinical Impression  Pt presents with slow but functional consumption of regular solids and thin liquids. No clinical signs of aspiration observed. Pts wife does report recent decompensation which could also result in decreased swallow function/safety from baseline  (result of MBS was flash penetration, small sips recommended). Recommend pt initate a dys 3 mechanical soft diet with thin liquid and full supervision for aspiration precautions. SLP will follow closely for tolerance.     Aspiration Risk  Moderate    Diet Recommendation Dysphagia 3 (Mechanical Soft);Thin liquid   Liquid Administration via: Cup;No straw Medication Administration: Whole meds with puree Supervision: Full supervision/cueing for compensatory strategies Compensations: Slow rate;Small sips/bites Postural Changes and/or Swallow Maneuvers: Seated upright 90 degrees;Upright 30-60 min after meal    Other  Recommendations Oral Care Recommendations: Oral care BID   Follow Up Recommendations  Skilled Nursing facility    Frequency and Duration min 2x/week  2 weeks   Pertinent Vitals/Pain NA    SLP Swallow Goals Patient will consume recommended diet without observed clinical signs of aspiration with: Modified independent assistance Patient will utilize recommended strategies during swallow to increase swallowing safety with: Modified independent assistance   Swallow Study Prior Functional Status       General HPI: Evan [name] is a 80/AA male recently discharged to [facility] place SNF from CIR 7/10 after sustaining a left pontine infarct 01/24/2012 w/ R hemiplegia and dysarthria. He presented to the emergency department one week ago after a fall but was discharged back to [facility]. He presents back to [facility] ER with lethargy and weakness noted today at SNF, his wife thinks that he has not been as interactive and alert for last 3-4 days. He was started on bactrim for UTI 3-4 days ago at [facility].  Upon evaluation in ER he was found to have slight worsening of his renal failure, hyperkalemia and a urinalysis was consistent with a urinary tract infection. During his last hospitalization and inpatient rehabilitation stay patient had 2 urinary tract infections. MBS on 01/30/12 showed flash  penetration of thin liquids, reduced with small single sips. Upon d/c pt on a regular diet with thin liquids. Wife reports pt was doing well at SNF, but after fall he continued to weaken. Needed full supervision with meals.  Type of Study: Bedside swallow evaluation Diet Prior to this Study: NPO Temperature Spikes Noted: No Respiratory Status: Room air History of Recent Intubation: No Behavior/Cognition: Alert;Cooperative Oral Cavity - Dentition: Adequate natural dentition Self-Feeding Abilities: Able to feed self;Needs assist Patient Positioning: Upright in bed Baseline Vocal Quality: Clear Volitional Cough: Strong Volitional Swallow: Able to elicit    Oral/Motor/Sensory Function Overall Oral Motor/Sensory Function: Impaired at baseline Labial ROM: Reduced right Labial Symmetry: Abnormal symmetry right Labial Strength: Reduced Lingual ROM: Reduced left;Reduced right Lingual Symmetry: Abnormal symmetry right;Abnormal symmetry left Lingual Strength: Reduced Facial ROM: Reduced right Facial Symmetry: Right droop Facial Strength: Reduced   Ice Chips     Thin Liquid Thin Liquid: Within functional limits    Nectar Thick Nectar Thick Liquid: Not tested   Honey Thick Honey Thick Liquid: Not tested   Puree Puree: Within functional limits   Solid   GO    Solid: Impaired Presentation: Self Fed Oral Phase Impairments: Reduced lingual movement/coordination Oral Phase Functional Implications: Other (comment) (slow masticationa nd transit, pt ind. with lingual sweep. )       Evan [name], Riley Nearing 03/26/2012,10:53 AM

## 2012-03-26 NOTE — Consult Note (Addendum)
[facility]        RENAL CONSULT   Reason for Consult: Patient with kidney transplant and worsening renal function Referring Physician: Dr. Lambert Keto  Evan [name] is a 78 y.o. black man followed in our office for over 25 years. He has diabetic nephropathy and had cadaveric renal transplant in 2012. Creatinine was 1.6 in April 2013. He had CVA in June of this year with right weakness and slurred speech. He was hospitalized at [facility], spent time on [facility] rehabilitation, and has been at [facility] receiving physical therapy/rehabilitation since one month ago. Last week while in the bathroom he fell and hit his head above his left eyebrow. He was seen at [facility] emergency room. CT scan showed hematoma in left frontal region but no new CVA. He was brought to [facility] yesterday with altered mental status, worse right weakness, worse slurred speech. Urinalysis showed TNTC WBCs, large LE, negative nitrite. He was on trimethoprim sulfa at [facility] place prior to coming to the emergency room for suspected UTI. He is now on Rocephin and normal saline at 50 cc per hour  Past Medical History  Diagnosis Date  . H/O kidney transplant   . Diabetes mellitus   . Cancer     hx prostate cancer  . ESRD (end stage renal disease) 2011    hemodialysis started Sept 2011, cadaveric (double) renal  transplant Mar 2012 at [facility]  . 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  Incontinence post transplant --scheduled for incontinence surgery (prior to hospitalization for CVA)                                   By Dr. Gala Lewandowsky [facility] urology)     C. UTIs Upper GI bleed (H. pylori positive June 2004) Prostate cancer-noted above. Seed implants by Dr. Brunilda Payor November 06 Prostatitis/urinary retention-TURP May 09 and December 09  (Dr. Brunilda Payor) Spinal stenosis-L3-5-seen by Dr. Almeta Monas lower extremity weakness and back pain  History reviewed. No pertinent family history. both his mother and maternal grandmother had diabetes mellitus. He has a daughter age 71 who is well  Social History: Born in [place] up in [place], graduated from [employer]; he has a Event organiser in education administration from [employer].  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 diltiazem 300/D. furosemide 20/D. glipizide 10/D. hydralazine 25 twice a day. Lantus insulin 5 units subcutaneous/D. mycophenolate 180 twice a day. Protonix 40/D. sodium bicarbonate 1300 mg twice a day. Septra single strength one tablet Monday Wednesday Friday, Prograf 5 mg twice a day Current meds: Baclofen 10 twice a day, TUMS 1.5 tabs 3 times a day, ceftriaxone 1G/D., Plavix 75/D., diltiazem 300/D., Lovenox 40 subcutaneous/D., hydralazine 25 twice a day, Lantus insulin 20/D. Plus SSI 3 times a day, mycophenolate 180 twice a day, Protonix 40 twice a day, Prograf 5 mg twice a day    ROS- no angina, no claudication, no melena, no hematochezia, no gross hematuria, no renal colic, some decreased force  of urinary stream, he has urgency incontinence, no doe, no orthopnea, no purulent sputum, no hemoptysis, no weight gain or weight loss, no cold or heat intolerance. He still has weakness on his  right side, he still complains of hiccups  Physical exam Blood pressure 120/63, pulse 70, temperature 98.8 F (37.1 C), temperature source Oral, resp. rate 20, height 5\' 11"  (1.803 m), weight 76.3 kg (168 lb 3.4 oz), SpO2 100.00%. Generally- he is awake, answers questions appropriately, he has  slurred speech Head-sutured laceration above left eyebrow Neck-not stiff Chest-clear Heart-regular rate and rhythm, 2/6 systolic murmur at upper left sternal border, no rub is heard Abdomen-renal transplant RLQ, nontender, no other organs or masses are felt, bowel sounds present GU-Foley catheter in place Extremities-no edema, no rash, no atheroemboli on feet, AV fistula patent in left upper arm Neuro-right-handed, decreased strength right UE and right LE, slurred speech  Lab Hemoglobin 10.7, WBC 7800, PLTS 245K Sodium 140 potassium 5.7 chloride 112 CO2 15 BUN 38 CR 1.68 (CR was 1.88 yesterday, CR was 1.48 on 02/09/2012), calcium 9.2, glucose 134 Urinalysis-SG 1.012, pH 7, 3+ glucose, 2+ protein, large LE, negative nitrite, TNTC, WBC 0-2 RBC, culture pending  Chest x-ray-low lung volumes, streaky retrocardiac opacity CT brain scan-left frontal scalp hematoma, no new CVA  Assessment/Plan: 1. DM-2 with   A.  glucose intolerance                        B.  Nephropathy       C.  Retinopathy 2. Renal transplant March 2012  -creatinine was 1.6 in May 2013 complicated by   A. 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 incontinence post transplant   C. UTIs   D. CKD.-3 3. High BP (currently not high). 4. Upper GI bleed (H. pylori positive June 04) plus hiccups recently 5. Prostate cancer (seed implants by Dr. Brunilda Payor November 06) 6. TURP May 09 and December 09 7. CVA June 2013 with right weakness-now with worsening right weakness 8. Spinal stenosis L3-5 (seen by Dr. Newell Coral in the past) 9.  Anemia  Plan  1. Lantus plus SSI 2. Continue with Myfortic and Prograf. Check  Prograf level (trough-prior to a.m. dose). He had been on Septra single strength one tablet Monday Wednesday Friday (PCP prophylaxis). I would recommend starting him back on this once his infection has cleared. I would not recommend Septra/Bactrim/trimethoprim sulfa for treatment of UTIs in this patient with  CKD. as it will cause elevation of creatinine. Discontinue normal saline-use sterile H20 with 150 bicarbonate (serum bicarbonate currently 15) 3. BP okay 4. Continue with PPI. I would not use baclofen in this patient who has had recent CVA (may cause altered mental status) 5. No suggestions  6. No suggestions 7. Check MRI of head, d/c baclofen 8. No suggestions 9.  Fe/TIBC/ ferritin in AM Dondrell Loudermilk F 03/26/2012, 6:20 PM

## 2012-03-26 NOTE — Progress Notes (Signed)
Evan [name] [id number] Transfer Data: 03/26/2012 7:26 AM Attending Provider: Marinda Elk, MD NWG:[id number],HYQMVHQ S, MD Code Status: Full  Evan [name] is a 78 y.o. male patient admitted from ED  No acute distress noted.  No c/o shortness of breath, no c/o chest pain.  Cardiac tele # [phone], in place, cardiac monitor yields:normal sinus rhythm.  Blood pressure 131/67, pulse 64, temperature 98.1 F (36.7 C), temperature source Oral, resp. rate 20, height 5\' 11"  (1.803 m), weight 76.3 kg (168 lb 3.4 oz), SpO2 100.00%.   IV Fluids:  IV in place, occlusive dsg intact without redness, IV cath foot right, condition patent and no redness normal saline.   Allergies:  Review of patient's allergies indicates no known allergies.  Past Medical History:   has a past medical history of H/O kidney transplant; Diabetes mellitus; Cancer; ESRD (end stage renal disease) (2011); and Stroke.  Past Surgical History:   has past surgical history that includes Kidney transplant and Prostate surgery (approx. 2003).  Social History:   reports that he quit smoking about 29 years ago. His smoking use included Cigarettes. He quit after 15 years of use. He has never used smokeless tobacco. He reports that he does not drink alcohol or use illicit drugs.  Skin: Stage 2 to sacrum.  Orientation to room, and floor completed with information packet given to patient/family. Admission INP armband ID verified with patient/family, and in place.   SR up x 2, fall assessment complete, with patient and family able to verbalize understanding of risk associated with falls, and verbalized understanding to call for assistance before getting out of bed.   Call light within reach. Patient able to voice and demonstrate understanding of unit orientation instructions.   Will cont to eval and treat per MD orders.  Evan Ditch, RN, [facility] 03/26/2012 7:26 AM

## 2012-03-26 NOTE — Care Management Note (Signed)
    Page 1 of 1   03/28/2012     11:55:00 AM   CARE MANAGEMENT NOTE 03/28/2012  Patient:  Evan [name], Evan [name]   Account Number:  [id number]  Date Initiated:  03/26/2012  Documentation initiated by:  Letha Cape  Subjective/Objective Assessment:   dx uti, hx of kidney txplt 2012  admit- from [facility]     Action/Plan:   speech eval today 8/21,   Anticipated DC Date:  03/28/2012   Anticipated DC Plan:  SKILLED NURSING FACILITY  In-house referral  Clinical Social Worker         Choice offered to / List presented to:             Status of service:  Completed, signed off Medicare Important Message given?   (If response is "NO", the following Medicare IM given date fields will be blank) Date Medicare IM given:   Date Additional Medicare IM given:    Discharge Disposition:  SKILLED NURSING FACILITY  Per UR Regulation:  Reviewed for med. necessity/level of care/duration of stay  If discussed at Long Length of Stay Meetings, dates discussed:    Comments:  03/28/12 11:54 Letha Cape RN, BSN [phone] patient is for possible dc today to [facility].  03/26/12 16:16 Letha Cape RN,BSN [phone] patient is from [facility], CSW referral.

## 2012-03-26 NOTE — Progress Notes (Signed)
INITIAL ADULT NUTRITION ASSESSMENT Date: 03/26/2012   Time: 11:32 AM Reason for Assessment: Nutrition Risk   INTERVENTION: 1. Glucerna Shake po TID, each supplement provides 220 kcal and 10 grams of protein. 2. Encouraged po intake 3. RD will continue to follow   ASSESSMENT: Male 78 y.o.  Dx: UTI (lower urinary tract infection)  Hx:  Past Medical History  Diagnosis Date  . H/O kidney transplant   . Diabetes mellitus   . Cancer     hx prostate cancer  . ESRD (end stage renal disease) 2011    hemodialysis started Sept 2011, cadaveric (double) renal  transplant Mar 2012 at [facility]  . Stroke     Related Meds:     . calcium carbonate  1.5 tablet Oral TID  . cefTRIAXone (ROCEPHIN)  IV  1 g Intravenous Q24H  . clopidogrel  75 mg Oral Daily  . diltiazem  300 mg Oral Daily  . enoxaparin (LOVENOX) injection  40 mg Subcutaneous Q24H  . hydrALAZINE  25 mg Oral BID  . insulin aspart  0-9 Units Subcutaneous TID WC  . insulin aspart  10 Units Subcutaneous Once  . insulin glargine  5 Units Subcutaneous QHS  . mycophenolate  180 mg Oral BID  . pantoprazole  40 mg Oral Daily  . sodium chloride  500 mL Intravenous Once  . sodium polystyrene  15 g Oral Once  . tacrolimus  5 mg Oral BID  . DISCONTD: cefTRIAXone (ROCEPHIN)  IV  1 g Intravenous Once  . DISCONTD: enoxaparin (LOVENOX) injection  30 mg Subcutaneous Q24H     Ht: 5\' 11"  (180.3 cm)  Wt: 168 lb 3.4 oz (76.3 kg)  Ideal Wt: 78.2 kg % Ideal Wt: 98%  Usual Wt: 175-180 lbs per family at last admission  Wt Readings from Last 5 Encounters:  03/25/12 168 lb 3.4 oz (76.3 kg)  03/04/12 169 lb 12 oz (76.998 kg)  02/13/12 169 lb 12.1 oz (77 kg)  01/24/12 167 lb 8.8 oz (76 kg)  01/08/12 174 lb (78.926 kg)    % Usual Wt: 96%  Body mass index is 23.46 kg/(m^2). Pt is WNL per current BMI  Food/Nutrition Related Hx: Pt reports normal appetite, unsure about weight   Labs:  CMP     Component Value Date/Time   NA 140 03/26/2012  0657   K 5.7* 03/26/2012 0657   CL 112 03/26/2012 0657   CO2 15* 03/26/2012 0657   GLUCOSE 134* 03/26/2012 0657   BUN 38* 03/26/2012 0657   CREATININE 1.68* 03/26/2012 0657   CALCIUM 9.2 03/26/2012 0657   CALCIUM 9.5 12/25/2011 1005   PROT 7.3 03/25/2012 1434   ALBUMIN 3.3* 03/25/2012 1434   AST 12 03/25/2012 1434   ALT 13 03/25/2012 1434   ALKPHOS 64 03/25/2012 1434   BILITOT 0.2* 03/25/2012 1434   GFRNONAA 37* 03/26/2012 0657   GFRAA 43* 03/26/2012 0657    Intake/Output Summary (Last 24 hours) at 03/26/12 1135 Last data filed at 03/26/12 1100  Gross per 24 hour  Intake    900 ml  Output   1000 ml  Net   -100 ml  ;  Diet Order: Dysphagia 3, thin liquids   Supplements/Tube Feeding: none  IVF:    sodium chloride Last Rate: 100 mL/hr at 03/26/12 0600    Estimated Nutritional Needs:   Kcal:  1700-1900 Protein: 65-80 gm  Fluid: 1.8-2 L   Pt recent d/c'd from CIR. Pt was being followed at that time for poor  po intake. Recently started on Megace for appetite stimulation. Pt states that he does have a better appetite recently. Pt has maintained weight for about 2 months per weight hx, but not at usual body weight prior to last admission.  Pt likes Glucerna, would like to continue with these daily.   NUTRITION DIAGNOSIS: -Predicted suboptimal energy intake (NI-1.6).  Status: Ongoing  RELATED TO: hospitalization   AS EVIDENCE BY: hx of poor intake   MONITORING/EVALUATION(Goals): Goal: PO intake of meals and supplements to meet nutrition needs Monitor: PO intake, weight, labs  EDUCATION NEEDS: -No education needs identified at this time   DOCUMENTATION CODES Per approved criteria  -Not Applicable    Clarene Duke RD, LDN Pager [phone] After Hours pager [phone]

## 2012-03-26 NOTE — Progress Notes (Signed)
[facility] PROGRESS NOTE  Evan [name] ZOX:[id number] DOB: [birth year] DOA: 03/25/2012   Assessment/Plan: Acute UTI (lower urinary tract infection)  (03/25/2012) -prior urine culture showed Escherichia coli and Citrobacter, right having been repeat a UA and urine cultures and continue her on Rocephin.  -has remained afebrile, no leukocytosis.  Diabetes mellitus type II (01/24/2012) -continue glipizide continue Lantus at a higher dose plus sliding scale. -CB's ACHS.  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 And physical therapy consult. -Get MRI of head as he has new right sided weakness.  History of renal transplant (01/24/2012)/Acute renal failure (03/25/2012): -his creatinine is getting better with IV fluids, the most likely contributing factor would be decreased intravascular volume and Bactrim. -Continue check basic metabolic panels an irregular basic and strict I.'s and O.  -prograf level.  New right-sided weakness: -He had a stroke on the left paracentral pontine area back on 01/24/2012 -I will go ahead and repeat MRI to rule new CVA. -speech evaluation and PT.  H/O atrial fibrillation without current medication (01/24/2012) -currently rate controlled without any medication. Continue to monitor. O no anticoagulation.  Dehydration (03/25/2012) -improving with IV fluids.  Hyperkalemia (03/25/2012) -continue check basic metabolic panel in the morning. This probably secondary to acute renal failure. -I will go ahead and give him one dose of Kayexalate. As he does have chronic renal failure with chronic diastolic heart failure. That way we'll have to continue giving such large amounts of IV fluids to improve his K.  Chronic diastolic heart failure, NYHA class 2 (03/25/2012): -compensated decrease IV fluids.  Metabolic encephalopathy (03/25/2012) -Now resolved 2/2 to UTI  Family Communication: Spouse  [phone] Disposition Plan: SNF     LOS: 1 day   Procedures:  MRI  CT head  CXR  CT neck  Antibiotics:  rocephin  Subjective: Patient relates he is having bad hiccups.  Objective: Filed Vitals:   03/25/12 2100 03/26/12 0600 03/26/12 1050 03/26/12 1356  BP: 191/69 131/67 134/67 120/63  Pulse: 73 64  70  Temp: 98.9 F (37.2 C) 98.1 F (36.7 C)  98.8 F (37.1 C)  TempSrc: Oral Oral  Oral  Resp: 20 20  20   Height: 5\' 11"  (1.803 m)     Weight: 76.3 kg (168 lb 3.4 oz)     SpO2: 100% 100%  100%    Intake/Output Summary (Last 24 hours) at 03/26/12 1716 Last data filed at 03/26/12 1357  Gross per 24 hour  Intake    960 ml  Output   1000 ml  Net    -40 ml   Weight change:   Exam:  General: Alert, awake, oriented x3, in no acute distress.  HEENT: No bruits, no goiter.  Heart: Regular rate and rhythm, without murmurs, rubs, gallops.  Lungs: Good air movement, bilateral air movement.  Abdomen: Soft, nontender, nondistended, positive bowel sounds.  Neuro: right side weakness   Data Reviewed: Basic Metabolic Panel:  Lab 03/26/12 8119 03/25/12 2106 03/25/12 1434  NA 140 -- 135  K 5.7* -- 5.8*  CL 112 -- 106  CO2 15* -- 18*  GLUCOSE 134* -- 342*  BUN 38* -- 48*  CREATININE 1.68* 1.66* 1.88*  CALCIUM 9.2 -- 9.2  MG -- -- --  PHOS -- -- --   Liver Function Tests:  Lab 03/25/12 1434  AST 12  ALT 13  ALKPHOS 64  BILITOT 0.2*  PROT 7.3  ALBUMIN 3.3*   No results found for this basename:  LIPASE:5,AMYLASE:5 in the last 168 hours No results found for this basename: AMMONIA:5 in the last 168 hours CBC:  Lab 03/25/12 2106 03/25/12 1434  WBC 7.8 8.2  NEUTROABS -- 6.3  HGB 10.7* 10.7*  HCT 31.5* 31.2*  MCV 76.6* 78.4  PLT 245 233   Cardiac Enzymes: No results found for this basename: CKTOTAL:5,CKMB:5,CKMBINDEX:5,TROPONINI:5 in the last 168 hours BNP: No components found with this basename: POCBNP:5 CBG:  Lab 03/26/12 1652 03/26/12 1200  03/26/12 0733 03/26/12 0418 03/26/12 0204  GLUCAP 307* 129* 127* 130* 132*    No results found for this or any previous visit (from the past 240 hour(s)).   Studies: Dg Chest 1 View  03/25/2012  *RADIOLOGY REPORT*  Clinical Data: 78 year old male with weakness and history of stroke.  CHEST - 1 VIEW  Comparison: 02/14/2012 and earlier.  Findings: AP view at 1457 hours.  Streaky retrocardiac opacity. Somewhat lower lung volumes overall.  Stable right lung base.  No pneumothorax, pulmonary edema or pleural effusion.  Cardiac size and mediastinal contours are within normal limits.  Visualized tracheal air column is within normal limits.  IMPRESSION: Lower lung volumes.  Streaky retrocardiac opacity could reflect atelectasis or infection/aspiration.   Original Report Authenticated By: Harley Hallmark, M.D.    Ct Head Wo Contrast  03/25/2012  *RADIOLOGY REPORT*  Clinical Data: Fall, increased weakness  CT HEAD WITHOUT CONTRAST  Technique:  Contiguous axial images were obtained from the base of the skull through the vertex without contrast.  Comparison: 03/18/2012  Findings: Interval decrease in size of the left frontal scalp hematoma.  No underlying fracture.  Mild brain atrophy and chronic microvascular white matter ischemic changes.  No acute intracranial hemorrhage, mass lesion, definite infarction, midline shift, herniation, hydrocephalus, or extra-axial fluid collection.  Gray- white matter differentiation maintained.  Cisterns patent.  No cerebellar abnormality.  Orbits are symmetric.  Mastoids and sinuses remain clear.  No depressed skull fracture.  IMPRESSION: Resolving left frontal scalp hematoma.  No acute intracranial finding.  Stable atrophy.   Original Report Authenticated By: Judie Petit. Ruel Favors, M.D.    Ct Head Wo Contrast  03/18/2012  *RADIOLOGY REPORT*  Clinical Data: Fall, head laceration  CT HEAD WITHOUT CONTRAST,CT CERVICAL SPINE WITHOUT CONTRAST  Technique:  Contiguous axial images were  obtained from the base of the skull through the vertex without contrast.,Technique: Multidetector CT imaging of the cervical spine was performed. Multiplanar CT image reconstructions were also generated.  Comparison: 01/24/2012  Findings: No skull fracture is noted.  There is a skin irregularity and soft tissue swelling left frontal scalp.  Subcutaneous hematoma in the frontal region measures 1.2 x 2.5 cm.  No intracranial hemorrhage, mass effect or midline shift.  No acute infarction.  No mass lesion is noted on this unenhanced scan.  Small lacunar infarct is noted in the left pons.  IMPRESSION: No acute intracranial abnormality.  There is soft tissue swelling subcutaneous stranding and subcutaneous hematoma in the left frontal region.  Hematoma measures 2.5 x 1.2 cm.  CT cervical spine without IV contrast:  Axial images of the cervical spine shows no acute fracture or subluxation.  There is disc space flattening with disc calcifications at C5-C6 level.  Mild anterior and mild posterior spurring at C5-C6 level.  Anterior spurring noted lower endplate of the C4 vertebral body.  Mild disc space flattening with mild anterior spurring and mild posterior spurring at C6-C7 level.  Mild anterior spurring lower endplate of the C7 vertebral body.  No  prevertebral soft tissue swelling.  Cervical airway is patent.  There is no pneumothorax in visualized lung apices.  Impression: 1.  No acute fracture or subluxation.  Degenerative changes as described above.  Original Report Authenticated By: Natasha Mead, M.D.   Ct Cervical Spine Wo Contrast  03/18/2012  *RADIOLOGY REPORT*  Clinical Data: Fall, head laceration  CT HEAD WITHOUT CONTRAST,CT CERVICAL SPINE WITHOUT CONTRAST  Technique:  Contiguous axial images were obtained from the base of the skull through the vertex without contrast.,Technique: Multidetector CT imaging of the cervical spine was performed. Multiplanar CT image reconstructions were also generated.  Comparison:  01/24/2012  Findings: No skull fracture is noted.  There is a skin irregularity and soft tissue swelling left frontal scalp.  Subcutaneous hematoma in the frontal region measures 1.2 x 2.5 cm.  No intracranial hemorrhage, mass effect or midline shift.  No acute infarction.  No mass lesion is noted on this unenhanced scan.  Small lacunar infarct is noted in the left pons.  IMPRESSION: No acute intracranial abnormality.  There is soft tissue swelling subcutaneous stranding and subcutaneous hematoma in the left frontal region.  Hematoma measures 2.5 x 1.2 cm.  CT cervical spine without IV contrast:  Axial images of the cervical spine shows no acute fracture or subluxation.  There is disc space flattening with disc calcifications at C5-C6 level.  Mild anterior and mild posterior spurring at C5-C6 level.  Anterior spurring noted lower endplate of the C4 vertebral body.  Mild disc space flattening with mild anterior spurring and mild posterior spurring at C6-C7 level.  Mild anterior spurring lower endplate of the C7 vertebral body.  No prevertebral soft tissue swelling.  Cervical airway is patent.  There is no pneumothorax in visualized lung apices.  Impression: 1.  No acute fracture or subluxation.  Degenerative changes as described above.  Original Report Authenticated By: Natasha Mead, M.D.    Scheduled Meds:   . calcium carbonate  1.5 tablet Oral TID  . cefTRIAXone (ROCEPHIN)  IV  1 g Intravenous Q24H  . clopidogrel  75 mg Oral Daily  . diltiazem  300 mg Oral Daily  . enoxaparin (LOVENOX) injection  40 mg Subcutaneous Q24H  . feeding supplement  237 mL Oral TID BM  . hydrALAZINE  25 mg Oral BID  . insulin aspart  0-9 Units Subcutaneous TID WC  . insulin glargine  5 Units Subcutaneous QHS  . mycophenolate  180 mg Oral BID  . pantoprazole  40 mg Oral Daily  . sodium chloride  500 mL Intravenous Once  . sodium polystyrene  15 g Oral Once  . tacrolimus  5 mg Oral BID  . DISCONTD: cefTRIAXone (ROCEPHIN)  IV   1 g Intravenous Once  . DISCONTD: enoxaparin (LOVENOX) injection  30 mg Subcutaneous Q24H   Continuous Infusions:   . sodium chloride 100 mL/hr at 03/26/12 1714    Lambert Keto, MD  [facility] Pager [phone]  If 7PM-7AM, please contact night-coverage [email or web address] Password [facility] 03/26/2012, 5:16 PM

## 2012-03-27 ENCOUNTER — Encounter (HOSPITAL_COMMUNITY): Payer: Medicare Other

## 2012-03-27 DIAGNOSIS — W19XXXA Unspecified fall, initial encounter: Secondary | ICD-10-CM

## 2012-03-27 DIAGNOSIS — R627 Adult failure to thrive: Secondary | ICD-10-CM

## 2012-03-27 LAB — FERRITIN: Ferritin: 1162 ng/mL — ABNORMAL HIGH (ref 22–322)

## 2012-03-27 LAB — GLUCOSE, CAPILLARY
Glucose-Capillary: 82 mg/dL (ref 70–99)
Glucose-Capillary: 191 mg/dL — ABNORMAL HIGH (ref 70–99)
Glucose-Capillary: 243 mg/dL — ABNORMAL HIGH (ref 70–99)

## 2012-03-27 LAB — BASIC METABOLIC PANEL
CO2: 18 mEq/L — ABNORMAL LOW (ref 19–32)
Calcium: 9.1 mg/dL (ref 8.4–10.5)
Creatinine, Ser: 1.54 mg/dL — ABNORMAL HIGH (ref 0.50–1.35)
Glucose, Bld: 105 mg/dL — ABNORMAL HIGH (ref 70–99)

## 2012-03-27 LAB — IRON AND TIBC
Iron: 67 ug/dL (ref 42–135)
TIBC: 176 ug/dL — ABNORMAL LOW (ref 215–435)

## 2012-03-27 MED ORDER — INSULIN GLARGINE 100 UNIT/ML ~~LOC~~ SOLN: 30.0000 [IU] | Freq: Every day | SUBCUTANEOUS | Status: DC

## 2012-03-27 MED ORDER — SODIUM BICARBONATE 8.4 % IV SOLN
INTRAVENOUS | Status: DC
  Administered 2012-03-27 – 2012-03-28 (×3): via INTRAVENOUS
  Filled 2012-03-27 (×4): qty 1000

## 2012-03-27 MED ORDER — BACLOFEN 5 MG HALF TABLET
5.0000 mg | ORAL_TABLET | Freq: Two times a day (BID) | ORAL | Status: DC
  Administered 2012-03-27 – 2012-03-29 (×5): 5 mg via ORAL
  Filled 2012-03-27 (×6): qty 1

## 2012-03-27 MED ORDER — INSULIN ASPART 100 UNIT/ML ~~LOC~~ SOLN
0.0000 [IU] | Freq: Three times a day (TID) | SUBCUTANEOUS | Status: DC
  Administered 2012-03-27: 3 [IU] via SUBCUTANEOUS
  Administered 2012-03-28: 1 [IU] via SUBCUTANEOUS
  Administered 2012-03-28 (×2): 3 [IU] via SUBCUTANEOUS

## 2012-03-27 MED ORDER — INSULIN GLARGINE 100 UNIT/ML ~~LOC~~ SOLN
20.0000 [IU] | Freq: Every day | SUBCUTANEOUS | Status: DC
  Administered 2012-03-27 – 2012-03-28 (×2): 20 [IU] via SUBCUTANEOUS

## 2012-03-27 MED ORDER — INSULIN ASPART 100 UNIT/ML ~~LOC~~ SOLN
3.0000 [IU] | Freq: Three times a day (TID) | SUBCUTANEOUS | Status: DC
  Administered 2012-03-27 – 2012-03-29 (×5): 3 [IU] via SUBCUTANEOUS

## 2012-03-27 NOTE — Consult Note (Signed)
WOC consult Note Reason for Consult: wound on sacrum.  Pt has Stage II pressure ulcer of the right upper gluteal fold. Pt reports that he sometimes has fecal incontinence.  This would contribute to the weakening of this tissue, along with sheer and pressure.   Wound type: Stage II pressure ulcer Pressure Ulcer POA: Yes Measurement:1.0cm x 1.0cm x 0.2cm  Wound ZOX:WRUE and moist, partial thickness skin loss, epithelial buds in wound bed Drainage (amount, consistency, odor) minimal, no odor Periwound: intact  Dressing procedure/placement/frequency: will continue silicone foam dressings to this area, should they be difficult to maintain, ok to use zinc based barrier cream instead.  Will add chair pressure relief for use while sitting.   Pt able to turn from side to side to avoid pressure while in the bed.   Re consult if needed, will not follow at this time. Thanks  Jalexia Lalli [employer], CWOCN [phone])

## 2012-03-27 NOTE — Clinical Documentation Improvement (Signed)
POA DOCUMENTATION CLARIFICATION QUERY  THIS DOCUMENT IS NOT A PERMANENT PART OF THE MEDICAL RECORD  TO RESPOND TO THE THIS QUERY, FOLLOW THE INSTRUCTIONS BELOW:  1. If needed, update documentation for the patient's encounter via the notes activity.  2. Access this query again and click edit on the In [employer].  3. After updating, or not, click F2 to complete all highlighted (required) fields concerning your review. Select "additional documentation in the medical record" OR "no additional documentation provided".  4. Click Sign note button.  5. The deficiency will fall out of your In Basket *Please let us know if you are not able to complete this workflow by phone or e-mail (listed below).  Please update your documentation within the medical record to reflect your response to this query.                                                                                    03/27/12  Dear Dr. Mable Paris   In a better effort to capture your patient's severity of illness, reflect appropriate length of stay and utilization of resources, a review of the patient medical record has revealed the following indicators.    Based on your clinical judgment, please clarify and document in a progress note and/or discharge summary the clinical condition associated with the following supporting information:  In responding to this query please exercise your independent judgment.  The fact that a query is asked, does not imply that any particular answer is desired or expected.  Medicare rules require specification as to whether an inpatient diagnosis was present at the time of admission.    Per nursing note day of admit "Stage 2 to sacrum" documented. If appropriate please document  if the following diagnosis,             Stage 2 to sacrum    was:       Present at the time of admission   NOT present at the time of inpatient admission and it developed during the inpatient stay  Unable to  clinically determine whether the condition was present on admission.   Documentation insufficient to determine if condition was present at the time of inpatient admission  You may use possible, probable, or suspect with inpatient documentation. possible, probable, suspected diagnoses MUST be documented at the time of discharge  Reviewed: additional documentation in the medical record      Thank You,  Leonette Most Addison  Clinical Documentation Specialist RN, BSN:  Pager [phone]  HIM off # [phone]  Health Information Management [place]

## 2012-03-27 NOTE — Clinical Social Work Psychosocial (Addendum)
    Clinical Social Work Department BRIEF PSYCHOSOCIAL ASSESSMENT 03/27/2012  Patient:  Evan [name], Evan [name]     Account Number:  [id number]     Admit date:  03/25/2012  Clinical Social Worker:  Hulan Fray  Date/Time:  03/27/2012 10:16 AM  Referred by:  RN  Date Referred:  03/27/2012 Referred for  Other - See comment   Other Referral:   Admit from facility   Interview type:  Patient Other interview type:    PSYCHOSOCIAL DATA Living Status:  FACILITY Admitted from facility:  [facility] Level of care:   Primary support name:  Carney Bern Primary support relationship to patient:  SPOUSE Degree of support available:   supportive    CURRENT CONCERNS Current Concerns  None Noted   Other Concerns:    SOCIAL WORK ASSESSMENT / PLAN Clinical Social Worker received referral for patient being admitted from a facility. CSW introduced patient and explained reason for visit. Patient reported that he is from [facility] and the plan is for him to return back at discharge. CSW will complete FL2 for MD's signature.   Assessment/plan status:  Psychosocial Support/Ongoing Assessment of Needs Other assessment/ plan:   CSW left a voice message with Jasmine December, Admissions Coordinator at facility to confirm that patient can return at dischage.  14:00pm CSW called Eber Jones at [facility] and patient did place a bed hold and is able to return back when medically stable.   Information/referral to community resources:   Patient is from facility    PATIENT'S/FAMILY'S RESPONSE TO PLAN OF CARE: Patient is agreeable to return back to [facility] at discharge.

## 2012-03-27 NOTE — Progress Notes (Signed)
Speech Language Pathology Dysphagia Treatment Patient Details Name: Evan [name] MRN: [id number] DOB: [birth year] Today's Date: 03/27/2012 Time: 9622-2979 SLP Time Calculation (min): 11 min  Assessment / Plan / Recommendation Clinical Impression  Pt seen for dysphagia treatment. Pt. able to state compensatory strategies and demonstrate with modified independent verbal cues. Consistent belching observed, indicative of likely esophageal dysphagia. SLP provided education regarding esophageal precautions. Mastication with Dys 3 diet was functional. However, recommend he continue on Dys 3 for energy conservation with current decompensated state. No further ST recommened while in acute. ST recommends ST f/u at SNF for diet upgrade when appropriate.       Diet Recommendation  Continue with Current Diet: Dysphagia 3 (mechanical soft);Thin liquid    SLP Plan Discharge SLP treatment due to (comment) (goals met)      Swallowing Goals  SLP Swallowing Goals Patient will consume recommended diet without observed clinical signs of aspiration with: Modified independent assistance Swallow Study Goal #1 - Progress: Met Patient will utilize recommended strategies during swallow to increase swallowing safety with: Modified independent assistance Swallow Study Goal #2 - Progress: Met  General Temperature Spikes Noted: No Respiratory Status: Room air Behavior/Cognition: Alert;Cooperative Oral Cavity - Dentition: Adequate natural dentition Patient Positioning: Upright in chair  Oral Cavity - Oral Hygiene Does patient have any of the following "at risk" factors?: Other - dysphagia Brush patient's teeth BID with toothbrush (using toothpaste with fluoride): Yes Patient is HIGH RISK - Oral Care Protocol followed (see row info): Yes   Dysphagia Treatment Treatment focused on: Utilization of compensatory strategies;Skilled observation of diet tolerance;Patient/family/caregiver education;Facilitation of oral  preparatory phase;Facilitation of oral phase;Facilitation of pharyngeal phase Treatment Methods/Modalities: Skilled observation Patient observed directly with PO's: Yes Type of PO's observed: Dysphagia 3 (soft);Thin liquids Feeding: Able to feed self Liquids provided via: Cup;No straw Type of cueing:  (none)   GO     Evan [name] 03/27/2012, 12:15 PM

## 2012-03-27 NOTE — ED Provider Notes (Signed)
Medical screening examination/treatment/procedure(s) were conducted as a shared visit with non-physician practitioner(s) and myself.  I personally evaluated the patient during the encounter   Evan Racer, MD 03/27/12 [phone]

## 2012-03-27 NOTE — Progress Notes (Signed)
Agree with student's note.  Breck Coons Coyle.Ed [employer] [phone]  03/27/2012

## 2012-03-27 NOTE — Progress Notes (Signed)
[facility] PROGRESS NOTE  GEOR[place]IA BARIA ZOX:[id number] DOB: May 18, 1932 DOA: 03/25/2012   Assessment/Plan: Acute UTI (lower urinary tract infection)  (03/25/2012) -prior urine culture showed Escherichia coli and Citrobacter, right having been repeat a UA and urine cultures less than 100,000, But he has been on antibiotics for probable UTIs will continue IV Rocephin. And will change until several orally tomorrow. -has remained afebrile, no leukocytosis.  Diabetes mellitus type II (01/24/2012) -continue glipizide , increase Lantus Lantus at a higher dose plus sliding scale. -CB's ACHS.  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 And physical therapy consult. -Get MRI of head showed no acute stroke  History of renal transplant (01/24/2012)/Acute renal failure (03/25/2012): -his creatinine is getting better with IV fluids, the most likely contributing factor would be decreased intravascular volume and Bactrim. Renal was consulted agree with stopping Bactrim and restart PCP prophylaxis once he's over this infection with another agent besides Bactrim. -Creatinine continues to improve.  -prograf level pending  New right-sided weakness: -He had a stroke on the left paracentral pontine area back on 01/24/2012 -I will go ahead and repeat MRI negative for stroke. -speech evaluation and PT pending.  H/O atrial fibrillation without current medication (01/24/2012) -currently rate controlled without any medication. Continue to monitor. On no anticoagulation.  Dehydration (03/25/2012) -Resolved with IV fluids.  Hyperkalemia (03/25/2012) -this only improved slightly with IV fluids. He was given one dose of Kayexalate now is resolved.  Chronic diastolic heart failure, NYHA class 2 (03/25/2012): -compensated decrease IV fluids.  Metabolic encephalopathy (03/25/2012) -Now resolved 2/2 to UTI  Sacral decubitus ulcer: Appreciate wound  care consult continue silicone foam dressing . Family Communication: Spouse [phone] Disposition Plan: SNF     LOS: 2 days   Procedures:  MRI  CT head  CXR  CT neck  Antibiotics:  rocephin  Subjective: Patient relates hiccups improved.  Objective: Filed Vitals:   03/26/12 1356 03/26/12 2218 03/27/12 0500 03/27/12 1101  BP: 120/63 154/61 163/65 149/76  Pulse: 70 74 82 90  Temp: 98.8 F (37.1 C) 98 F (36.7 C) 98.9 F (37.2 C)   TempSrc: Oral Oral Oral   Resp: 20 18 20    Height:      Weight:   76.2 kg (167 lb 15.9 oz)   SpO2: 100% 100% 100%     Intake/Output Summary (Last 24 hours) at 03/27/12 1344 Last data filed at 03/27/12 0844  Gross per 24 hour  Intake 1971.66 ml  Output   1451 ml  Net 520.66 ml   Weight change: -0.1 kg (-3.5 oz)  Exam:  General: Alert, awake, oriented x3, in no acute distress.  HEENT: No bruits, no goiter.  Heart: Regular rate and rhythm, without murmurs, rubs, gallops.  Lungs: Good air movement, bilateral air movement.  Neuro: right side weakness   Data Reviewed: Basic Metabolic Panel:  Lab 03/27/12 8119 03/26/12 0657 03/25/12 2106 03/25/12 1434  NA 141 140 -- 135  K 4.6 5.7* -- --  CL 111 112 -- 106  CO2 18* 15* -- 18*  GLUCOSE 105* 134* -- 342*  BUN 35* 38* -- 48*  CREATININE 1.54* 1.68* 1.66* 1.88*  CALCIUM 9.1 9.2 -- 9.2  MG -- -- -- --  PHOS -- -- -- --   Liver Function Tests:  Lab 03/25/12 1434  AST 12  ALT 13  ALKPHOS 64  BILITOT 0.2*  PROT 7.3  ALBUMIN 3.3*   No results found for this basename: LIPASE:5,AMYLASE:5 in  the last 168 hours No results found for this basename: AMMONIA:5 in the last 168 hours CBC:  Lab 03/25/12 2106 03/25/12 1434  WBC 7.8 8.2  NEUTROABS -- 6.3  HGB 10.7* 10.7*  HCT 31.5* 31.2*  MCV 76.6* 78.4  PLT 245 233   Cardiac Enzymes: No results found for this basename: CKTOTAL:5,CKMB:5,CKMBINDEX:5,TROPONINI:5 in the last 168 hours BNP: No components found with this  basename: POCBNP:5 CBG:  Lab 03/27/12 1200 03/27/12 0800 03/26/12 2213 03/26/12 1652 03/26/12 1200  GLUCAP 191* 82 221* 307* 129*    Recent Results (from the past 240 hour(s))  URINE CULTURE     Status: Normal   Collection Time   03/25/12  3:52 PM      Component Value Range Status Comment   Specimen Description URINE, RANDOM   Final    Special Requests NONE   Final    Culture  Setup Time 03/25/2012 22:05   Final    Colony Count 9,000 COLONIES/ML   Final    Culture INSIGNIFICANT GROWTH   Final    Report Status 03/26/2012 FINAL   Final      Studies: Dg Chest 1 View  03/25/2012  *RADIOLOGY REPORT*  Clinical Data: 78 year old male with weakness and history of stroke.  CHEST - 1 VIEW  Comparison: 02/14/2012 and earlier.  Findings: AP view at 1457 hours.  Streaky retrocardiac opacity. Somewhat lower lung volumes overall.  Stable right lung base.  No pneumothorax, pulmonary edema or pleural effusion.  Cardiac size and mediastinal contours are within normal limits.  Visualized tracheal air column is within normal limits.  IMPRESSION: Lower lung volumes.  Streaky retrocardiac opacity could reflect atelectasis or infection/aspiration.   Original Report Authenticated By: Harley Hallmark, M.D.    Ct Head Wo Contrast  03/25/2012  *RADIOLOGY REPORT*  Clinical Data: Fall, increased weakness  CT HEAD WITHOUT CONTRAST  Technique:  Contiguous axial images were obtained from the base of the skull through the vertex without contrast.  Comparison: 03/18/2012  Findings: Interval decrease in size of the left frontal scalp hematoma.  No underlying fracture.  Mild brain atrophy and chronic microvascular white matter ischemic changes.  No acute intracranial hemorrhage, mass lesion, definite infarction, midline shift, herniation, hydrocephalus, or extra-axial fluid collection.  Gray- white matter differentiation maintained.  Cisterns patent.  No cerebellar abnormality.  Orbits are symmetric.  Mastoids and sinuses remain  clear.  No depressed skull fracture.  IMPRESSION: Resolving left frontal scalp hematoma.  No acute intracranial finding.  Stable atrophy.   Original Report Authenticated By: Judie Petit. Ruel Favors, M.D.    Ct Head Wo Contrast  03/18/2012  *RADIOLOGY REPORT*  Clinical Data: Fall, head laceration  CT HEAD WITHOUT CONTRAST,CT CERVICAL SPINE WITHOUT CONTRAST  Technique:  Contiguous axial images were obtained from the base of the skull through the vertex without contrast.,Technique: Multidetector CT imaging of the cervical spine was performed. Multiplanar CT image reconstructions were also generated.  Comparison: 01/24/2012  Findings: No skull fracture is noted.  There is a skin irregularity and soft tissue swelling left frontal scalp.  Subcutaneous hematoma in the frontal region measures 1.2 x 2.5 cm.  No intracranial hemorrhage, mass effect or midline shift.  No acute infarction.  No mass lesion is noted on this unenhanced scan.  Small lacunar infarct is noted in the left pons.  IMPRESSION: No acute intracranial abnormality.  There is soft tissue swelling subcutaneous stranding and subcutaneous hematoma in the left frontal region.  Hematoma measures 2.5 x 1.2 cm.  CT cervical spine without IV contrast:  Axial images of the cervical spine shows no acute fracture or subluxation.  There is disc space flattening with disc calcifications at C5-C6 level.  Mild anterior and mild posterior spurring at C5-C6 level.  Anterior spurring noted lower endplate of the C4 vertebral body.  Mild disc space flattening with mild anterior spurring and mild posterior spurring at C6-C7 level.  Mild anterior spurring lower endplate of the C7 vertebral body.  No prevertebral soft tissue swelling.  Cervical airway is patent.  There is no pneumothorax in visualized lung apices.  Impression: 1.  No acute fracture or subluxation.  Degenerative changes as described above.  Original Report Authenticated By: Natasha Mead, M.D.   Ct Cervical Spine Wo  Contrast  03/18/2012  *RADIOLOGY REPORT*  Clinical Data: Fall, head laceration  CT HEAD WITHOUT CONTRAST,CT CERVICAL SPINE WITHOUT CONTRAST  Technique:  Contiguous axial images were obtained from the base of the skull through the vertex without contrast.,Technique: Multidetector CT imaging of the cervical spine was performed. Multiplanar CT image reconstructions were also generated.  Comparison: 01/24/2012  Findings: No skull fracture is noted.  There is a skin irregularity and soft tissue swelling left frontal scalp.  Subcutaneous hematoma in the frontal region measures 1.2 x 2.5 cm.  No intracranial hemorrhage, mass effect or midline shift.  No acute infarction.  No mass lesion is noted on this unenhanced scan.  Small lacunar infarct is noted in the left pons.  IMPRESSION: No acute intracranial abnormality.  There is soft tissue swelling subcutaneous stranding and subcutaneous hematoma in the left frontal region.  Hematoma measures 2.5 x 1.2 cm.  CT cervical spine without IV contrast:  Axial images of the cervical spine shows no acute fracture or subluxation.  There is disc space flattening with disc calcifications at C5-C6 level.  Mild anterior and mild posterior spurring at C5-C6 level.  Anterior spurring noted lower endplate of the C4 vertebral body.  Mild disc space flattening with mild anterior spurring and mild posterior spurring at C6-C7 level.  Mild anterior spurring lower endplate of the C7 vertebral body.  No prevertebral soft tissue swelling.  Cervical airway is patent.  There is no pneumothorax in visualized lung apices.  Impression: 1.  No acute fracture or subluxation.  Degenerative changes as described above.  Original Report Authenticated By: Natasha Mead, M.D.    Scheduled Meds:    . calcium carbonate  1 tablet Oral BID  . cefTRIAXone (ROCEPHIN)  IV  1 g Intravenous Q24H  . clopidogrel  75 mg Oral Daily  . diltiazem  300 mg Oral Daily  . enoxaparin (LOVENOX) injection  40 mg Subcutaneous  Q24H  . feeding supplement  237 mL Oral TID BM  . hydrALAZINE  25 mg Oral BID  . insulin aspart  0-9 Units Subcutaneous TID WC  . insulin glargine  20 Units Subcutaneous QHS  . mycophenolate  180 mg Oral BID  . pantoprazole  40 mg Oral Q1200  . sodium polystyrene  15 g Oral Once  . sodium polystyrene  15 g Oral Once  . tacrolimus  5 mg Oral BID  . DISCONTD: baclofen  10 mg Oral BID  . DISCONTD: calcium carbonate  1.5 tablet Oral TID  . DISCONTD: insulin glargine  5 Units Subcutaneous QHS  . DISCONTD: pantoprazole  40 mg Oral Daily   Continuous Infusions:    .  sodium bicarbonate infusion sterile water 1000 mL 50 mL/hr at 03/26/12 2053  . DISCONTD:  sodium chloride 50 mL/hr at 03/26/12 1846    Lambert Keto, MD  [facility] Pager [phone]  If 7PM-7AM, please contact night-coverage [email or web address] Password [facility] 03/27/2012, 1:44 PM

## 2012-03-27 NOTE — Evaluation (Signed)
Physical Therapy Evaluation Patient Details Name: Evan [name] MRN: [id number] DOB: 1932/08/04 Today's Date: 03/27/2012 Time: 1308-6578 PT Time Calculation (min): 25 min  PT Assessment / Plan / Recommendation Clinical Impression  Pt admitted with UTI from SNF after recent admit for L pontine infarct with time in CIR prior to transfer to SNF. Pt with decreased strength, transfers and mobility currently from prior to admission. Pt will benefit from acute therapy to maximize mobility, transfers and gait prior to discharge to increase function and decrease burden of care    PT Assessment  Patient needs continued PT services    Follow Up Recommendations  Skilled nursing facility    Barriers to Discharge None      Equipment Recommendations  Defer to next venue    Recommendations for Other Services OT consult   Frequency Min 3X/week    Precautions / Restrictions Precautions Precautions: Fall   Pertinent Vitals/Pain No pain      Mobility  Bed Mobility Bed Mobility: Rolling Right;Sitting - Scoot to Edge of Bed;Right Sidelying to Sit Rolling Right: 3: Mod assist;With rail Right Sidelying to Sit: 2: Max assist Sitting - Scoot to [place] of Bed: 2: Max assist Details for Bed Mobility Assistance: Pt with max cueing for hand placement, use of rail and sequence of transfer. Pt rolled right x 4 for pericare due to BM prior to transfer to sitting. Facilitation at trunk and hip to come to sitting and assist of pad with cueing for reciprocal scooting to EOB Transfers Transfers: Sit to Stand;Stand to Sit;Squat Pivot Transfers Sit to Stand: 1: +2 Total assist;From bed Sit to Stand: Patient Percentage: 50% Stand to Sit: 1: +2 Total assist;To chair/3-in-1 Stand to Sit: Patient Percentage: 50% Squat Pivot Transfers: 1: +2 Total assist Squat Pivot Transfers: Patient Percentage: 50% Details for Transfer Assistance: pt with slightly elevated bed, cueing for anterior translation and positioning to  stand into squatting position and transfer toward pt Left to recliner with assist for hand placement and pelvis rotation with increased time and pt with increased time to move RUE to assist with transfer Ambulation/Gait Ambulation/Gait Assistance: Not tested (comment)    Exercises General Exercises - Lower Extremity Short Arc Quad: AROM;Right;10 reps;Seated Hip Flexion/Marching: AROM;Right;10 reps;Seated   PT Diagnosis: Abnormality of gait;Hemiplegia dominant side  PT Problem List: Decreased strength;Decreased activity tolerance;Decreased balance;Decreased mobility;Decreased coordination PT Treatment Interventions: Functional mobility training;Therapeutic activities;Therapeutic exercise;Balance training;Patient/family education;DME instruction;Neuromuscular re-education   PT Goals Acute Rehab PT Goals PT Goal Formulation: With patient Time For Goal Achievement: 04/10/12 Potential to Achieve Goals: Fair Pt will Roll Supine to Right Side: with min assist PT Goal: Rolling Supine to Right Side - Progress: Goal set today Pt will go Supine/Side to Sit: with min assist PT Goal: Supine/Side to Sit - Progress: Goal set today Pt will Sit at Edge of Bed: 1-2 min;with supervision;with unilateral upper extremity support PT Goal: Sit at Edge Of Bed - Progress: Goal set today Pt will go Sit to Supine/Side: with mod assist PT Goal: Sit to Supine/Side - Progress: Goal set today Pt will go Sit to Stand: with mod assist PT Goal: Sit to Stand - Progress: Goal set today Pt will go Stand to Sit: with mod assist PT Goal: Stand to Sit - Progress: Goal set today Pt will Transfer Bed to Chair/Chair to Bed: with mod assist PT Transfer Goal: Bed to Chair/Chair to Bed - Progress: Goal set today  Visit Information  Last PT Received On: 03/27/12 Assistance Needed: +2  Subjective Data  Subjective: I just can't do as much as I was before Patient Stated Goal: be able to use the walker   Prior Functioning   Home Living Lives With: Other (Comment) Available Help at Discharge: Skilled Nursing Facility Type of Home: Skilled Nursing Facility Home Access: Level entry Home Layout: One level Home Adaptive Equipment: Walker - rolling;Wheelchair - manual Prior Function Level of Independence: Needs assistance Needs Assistance: Light Housekeeping;Meal Prep;Gait;Bathing;Dressing Bath: Moderate Dressing: Moderate Meal Prep: Total Light Housekeeping: Total Gait Assistance: min assist with RW max 76ft Able to Take Stairs?: No Driving: No Vocation: Retired Comments: Pt was living at home with wife prior to CVA Communication Communication: No difficulties    Cognition  Overall Cognitive Status: Appears within functional limits for tasks assessed/performed Arousal/Alertness: Awake/alert Orientation Level: Appears intact for tasks assessed Behavior During Session: [facility] for tasks performed    Extremity/Trunk Assessment Right Lower Extremity Assessment RLE ROM/Strength/Tone: Deficits RLE ROM/Strength/Tone Deficits: hip flexion 3/5, knee extension 2+/5, knee flexion 3/5, hip Abdct/Add 3+/5 RLE Sensation: Deficits RLE Sensation Deficits: able to sense touch but decreased compared to LLE Left Lower Extremity Assessment LLE ROM/Strength/Tone: [facility] for tasks assessed (4-4+/5 throughout) LLE Sensation: WFL - Light Touch LLE Coordination: WFL - gross/fine motor   Balance Static Sitting Balance Static Sitting - Balance Support: Bilateral upper extremity supported;Feet supported Static Sitting - Level of Assistance: 4: Min assist Static Sitting - Comment/# of Minutes: pt with tendency toward posterior right lean with cueing and assist to correct posture unable to achieve midline without physical assist, 4 min  End of Session PT - End of Session Equipment Utilized During Treatment: Gait belt Activity Tolerance: Patient tolerated treatment well Patient left: in chair;with call bell/phone within reach;with  nursing in room Nurse Communication: Mobility status  GP     Delorse Lek 03/27/2012, 9:05 AM  Delaney Meigs, PT [phone]

## 2012-03-27 NOTE — Progress Notes (Signed)
A/P S/P deceased donor kidney transplant UTI  May need chronic suppressive therapy as simple long term strategy for Tx.  We were asked by Dr.Joseph to see Evan [name] who is a 78 y.o. male s/p deceased donor renal transplant March 2012 at [facility] with post transplant complications including renal artery stenosis s/p PTA, incontinence,CMV viremia, and UTI.Marland Kitchen  He has a history of prostatitis, urinary retention and has undergone TURP in 2009 per Dr. Brunilda Payor.  He had a left pontine infarct in June 2013 complicated by R sided hemiplegia.  He presented yesterday with altered mental status and found to have a UTI.  He had a couple of UTIs (citerobacter and e coli) during his rehab stay.  On 02/18/12 sCreat was 1.91 mg/dl and 4/54/09 creat was 8.11 w/ BUN of 33 and 02/26/12 BUN35, creat 1.7mg /dl.  On 8/20 creat was 1.66 & K today 5.31meq/l. Subjective: Interval History:None  Objective: Vital signs in last 24 hours: Temp:  [98 F (36.7 C)-98.9 F (37.2 C)] 98.9 F (37.2 C) (08/22 0500) Pulse Rate:  [70-90] 90  (08/22 1101) Resp:  [18-20] 20  (08/22 0500) BP: (120-163)/(61-76) 149/76 mmHg (08/22 1101) SpO2:  [100 %] 100 % (08/22 0500) Weight:  [76.2 kg (167 lb 15.9 oz)] 76.2 kg (167 lb 15.9 oz) (08/22 0500) Weight change: -0.1 kg (-3.5 oz)  Intake/Output from previous day: 08/21 0701 - 08/22 0700 In: 1971.7 [P.O.:60; I.V.:1861.7; IV Piggyback:50] Out: 501 [Urine:500; Stool:1] Intake/Output this shift: Total I/O In: -  Out: 1450 [Urine:1450]  General appearance: alert, cooperative and appears stated age Chest wall: no tenderness Cardio: regular rate and rhythm, S1, S2 normal, no murmur, click, rub or gallop GI: soft, non-tender; bowel sounds normal; no masses,  no organomegaly Extremities: edema trace  Lab Results:  [facility] 03/25/12 2106 03/25/12 1434  WBC 7.8 8.2  HGB 10.7* 10.7*  HCT 31.5* 31.2*  PLT 245 233   BMET:  Basename 03/27/12 0655 03/26/12 0657  NA 141 140  K 4.6 5.7*    CL 111 112  CO2 18* 15*  GLUCOSE 105* 134*  BUN 35* 38*  CREATININE 1.54* 1.68*  CALCIUM 9.1 9.2   No results found for this basename: PTH:2 in the last 72 hours Iron Studies:  Basename 03/27/12 0655  IRON 67  TIBC 176*  TRANSFERRIN --  FERRITIN 1162*   Studies/Results: Dg Chest 1 View  03/25/2012  *RADIOLOGY REPORT*  Clinical Data: 78 year old male with weakness and history of stroke.  CHEST - 1 VIEW  Comparison: 02/14/2012 and earlier.  Findings: AP view at 1457 hours.  Streaky retrocardiac opacity. Somewhat lower lung volumes overall.  Stable right lung base.  No pneumothorax, pulmonary edema or pleural effusion.  Cardiac size and mediastinal contours are within normal limits.  Visualized tracheal air column is within normal limits.  IMPRESSION: Lower lung volumes.  Streaky retrocardiac opacity could reflect atelectasis or infection/aspiration.   Original Report Authenticated By: Harley Hallmark, M.D.    Ct Head Wo Contrast  03/25/2012  *RADIOLOGY REPORT*  Clinical Data: Fall, increased weakness  CT HEAD WITHOUT CONTRAST  Technique:  Contiguous axial images were obtained from the base of the skull through the vertex without contrast.  Comparison: 03/18/2012  Findings: Interval decrease in size of the left frontal scalp hematoma.  No underlying fracture.  Mild brain atrophy and chronic microvascular white matter ischemic changes.  No acute intracranial hemorrhage, mass lesion, definite infarction, midline shift, herniation, hydrocephalus, or extra-axial fluid collection.  Wallace Cullens- white matter  differentiation maintained.  Cisterns patent.  No cerebellar abnormality.  Orbits are symmetric.  Mastoids and sinuses remain clear.  No depressed skull fracture.  IMPRESSION: Resolving left frontal scalp hematoma.  No acute intracranial finding.  Stable atrophy.   Original Report Authenticated By: Judie Petit. Ruel Favors, M.D.    Mr Brain Wo Contrast  03/27/2012  *RADIOLOGY REPORT*  Clinical Data: Right-sided  weakness.  Fall 1 week ago.  History of pontine infarct 01/2012.  Resulting of the right hemiplegia and dysarthria.  Renal failure.  Prostate cancer.  MRI HEAD WITHOUT CONTRAST  Technique:  Multiplanar, multiecho pulse sequences of the brain and surrounding structures were obtained according to standard protocol without intravenous contrast.  Comparison: 03/25/2012 CT.  01/24/2012 MR.  Findings: No acute infarct.  Remote bilateral pontine infarcts most recent involving the left pons.  Minimal interval improvement of altered signal intensity previously noted involving the occipital lobes.  Mild small vessel disease type changes.  Left frontal subcutaneous hematoma.  No intracranial hemorrhage.  Global atrophy without hydrocephalus.  Altered signal intensity of bone marrow may represent result of prostate metastatic disease versus result of anemia from renal failure.  Partial opacification right and mastoid air cells.  No obstructing lesion noted in the posterior-superior nasopharynx causing eustachian tube dysfunction.  Minimal ethmoid sinus air cells and maxillary sinus mucosal thickening.  Major intracranial vascular structures are patent with small vertebral arteries and basilar artery.  Exophthalmos.  IMPRESSION:  No acute infarct.  Remote bilateral pontine infarcts most recent involving the left pons.  Left frontal subcutaneous hematoma.  No intracranial hemorrhage.  Global atrophy without hydrocephalus.  Altered signal intensity of bone marrow may represent result of prostate metastatic disease versus result of anemia from renal failure.  Please see above.   Original Report Authenticated By: Fuller Canada, M.D.     Scheduled:   . calcium carbonate  1 tablet Oral BID  . cefTRIAXone (ROCEPHIN)  IV  1 g Intravenous Q24H  . clopidogrel  75 mg Oral Daily  . diltiazem  300 mg Oral Daily  . enoxaparin (LOVENOX) injection  40 mg Subcutaneous Q24H  . feeding supplement  237 mL Oral TID BM  . hydrALAZINE  25  mg Oral BID  . insulin aspart  0-9 Units Subcutaneous TID WC  . insulin glargine  20 Units Subcutaneous QHS  . mycophenolate  180 mg Oral BID  . pantoprazole  40 mg Oral Q1200  . sodium polystyrene  15 g Oral Once  . sodium polystyrene  15 g Oral Once  . tacrolimus  5 mg Oral BID  . DISCONTD: baclofen  10 mg Oral BID  . DISCONTD: calcium carbonate  1.5 tablet Oral TID  . DISCONTD: insulin glargine  5 Units Subcutaneous QHS  . DISCONTD: pantoprazole  40 mg Oral Daily     LOS: 2 days   Akyia Borelli C 03/27/2012,12:52 PM

## 2012-03-27 NOTE — Progress Notes (Signed)
Inpatient Diabetes Program Recommendations  AACE/ADA: New Consensus Statement on Inpatient Glycemic Control (2013)  Target Ranges:  Prepandial:   less than 140 mg/dL      Peak postprandial:   less than 180 mg/dL (1-2 hours)      Critically ill patients:  140 - 180 mg/dL   Reason for Visit: Results for Evan [name], Evan [name] (MRN [id number]) as of 03/27/2012 13:54  Ref. Range 03/26/2012 12:00 03/26/2012 16:52 03/26/2012 22:13 03/27/2012 08:00 03/27/2012 12:00  Glucose-Capillary Latest Range: 70-99 mg/dL 119 (H) 147 (H) 829 (H) 82 191 (H)   Note CBG this AM=82 mg/dL.  Pt. Received Lantus 20 units last PM.  His home dose of Lantus is 5 units q HS.  Consider adding Novolog 4 units tid with meals to cover meal intake.  Consider reducing Lantus back to 20 units since fasting CBG normal this morning.

## 2012-03-27 NOTE — Progress Notes (Signed)
Agree with student's note.  Breck Coons [place].Ed [employer] [phone]

## 2012-03-27 NOTE — Progress Notes (Signed)
Speech Language Pathology Discharge Patient Details Name: EDOARDO LAFORTE MRN: [id number] DOB: 1931/10/02 Today's Date: 03/27/2012 Time: 6295-2841 SLP Time Calculation (min): 11 min  Patient discharged from SLP services secondary to goals met and no further SLP needs identified.  Please see latest therapy progress note for current level of functioning and progress toward goals.    Progress and discharge plan discussed with patient and/or caregiver: Patient/Caregiver agrees with plan  GO     Theotis Burrow 03/27/2012, 12:15 PM

## 2012-03-28 LAB — GLUCOSE, CAPILLARY
Glucose-Capillary: 131 mg/dL — ABNORMAL HIGH (ref 70–99)
Glucose-Capillary: 313 mg/dL — ABNORMAL HIGH (ref 70–99)

## 2012-03-28 LAB — RENAL FUNCTION PANEL
BUN: 33 mg/dL — ABNORMAL HIGH (ref 6–23)
CO2: 21 mEq/L (ref 19–32)
Chloride: 104 mEq/L (ref 96–112)
GFR calc Af Amer: 53 mL/min — ABNORMAL LOW (ref >90)
Glucose, Bld: 135 mg/dL — ABNORMAL HIGH (ref 70–99)
Potassium: 4.9 mEq/L (ref 3.5–5.1)
Sodium: 137 mEq/L (ref 135–145)

## 2012-03-28 MED ORDER — FUROSEMIDE 20 MG PO TABS
10.0000 mg | ORAL_TABLET | Freq: Every day | ORAL | Status: DC
  Administered 2012-03-29: 10 mg via ORAL
  Filled 2012-03-28: qty 0.5

## 2012-03-28 MED ORDER — CIPROFLOXACIN HCL 250 MG PO TABS
250.0000 mg | ORAL_TABLET | Freq: Two times a day (BID) | ORAL | Status: DC
  Administered 2012-03-28 – 2012-03-29 (×3): 250 mg via ORAL
  Filled 2012-03-28 (×6): qty 1

## 2012-03-28 MED ORDER — CIPROFLOXACIN HCL 250 MG PO TABS: 250.0000 mg | ORAL_TABLET | Freq: Two times a day (BID) | ORAL | Status: DC

## 2012-03-28 MED ORDER — INSULIN GLARGINE 100 UNIT/ML ~~LOC~~ SOLN: 20.0000 [IU] | Freq: Every day | SUBCUTANEOUS | Status: DC

## 2012-03-28 MED ORDER — FUROSEMIDE 20 MG PO TABS: 10.0000 mg | ORAL_TABLET | Freq: Every day | ORAL | Status: DC

## 2012-03-28 MED ORDER — HYDRALAZINE HCL 50 MG PO TABS
50.0000 mg | ORAL_TABLET | Freq: Two times a day (BID) | ORAL | Status: DC
  Administered 2012-03-28 – 2012-03-29 (×2): 50 mg via ORAL
  Filled 2012-03-28 (×3): qty 1

## 2012-03-28 NOTE — Progress Notes (Signed)
Assesment/plan S/P deceased donor kidney transplant  Pyuria (culture neg for UTI)   May need chronic suppressive therapy as simple long term strategy for Tx.  F/u with Dr Caryn Section & urology  Subjective: Interval History: none.  Objective: Vital signs in last 24 hours: Temp:  [98.2 F (36.8 C)-99.1 F (37.3 C)] 99.1 F (37.3 C) (08/23 1425) Pulse Rate:  [74-88] 74  (08/23 1425) Resp:  [18-21] 18  (08/23 1425) BP: (107-170)/(65-78) 107/65 mmHg (08/23 1425) SpO2:  [100 %] 100 % (08/23 1425) Weight:  [78.4 kg (172 lb 13.5 oz)] 78.4 kg (172 lb 13.5 oz) (08/23 0549) Weight change: 2.2 kg (4 lb 13.6 oz)  Intake/Output from previous day: 08/22 0701 - 08/23 0700 In: 1773.8 [P.O.:680; I.V.:1093.8] Out: 3500 [Urine:3500] Intake/Output this shift: Total I/O In: 757 [P.O.:757] Out: 1450 [Urine:1450]  General appearance: alert and cooperative Cooperative Ext 1+ edema  Lab Results:  [facility] 03/25/12 2106  WBC 7.8  HGB 10.7*  HCT 31.5*  PLT 245   BMET:  Basename 03/28/12 0725 03/27/12 0655  NA 137 141  K 4.9 4.6  CL 104 111  CO2 21 18*  GLUCOSE 135* 105*  BUN 33* 35*  CREATININE 1.43* 1.54*  CALCIUM 9.2 9.1   No results found for this basename: PTH:2 in the last 72 hours Iron Studies:  Basename 03/27/12 0655  IRON 67  TIBC 176*  TRANSFERRIN --  FERRITIN 1162*   Studies/Results: Mr Brain Wo Contrast  03/27/2012  *RADIOLOGY REPORT*  Clinical Data: Right-sided weakness.  Fall 1 week ago.  History of pontine infarct 01/2012.  Resulting of the right hemiplegia and dysarthria.  Renal failure.  Prostate cancer.  MRI HEAD WITHOUT CONTRAST  Technique:  Multiplanar, multiecho pulse sequences of the brain and surrounding structures were obtained according to standard protocol without intravenous contrast.  Comparison: 03/25/2012 CT.  01/24/2012 MR.  Findings: No acute infarct.  Remote bilateral pontine infarcts most recent involving the left pons.  Minimal interval improvement of  altered signal intensity previously noted involving the occipital lobes.  Mild small vessel disease type changes.  Left frontal subcutaneous hematoma.  No intracranial hemorrhage.  Global atrophy without hydrocephalus.  Altered signal intensity of bone marrow may represent result of prostate metastatic disease versus result of anemia from renal failure.  Partial opacification right and mastoid air cells.  No obstructing lesion noted in the posterior-superior nasopharynx causing eustachian tube dysfunction.  Minimal ethmoid sinus air cells and maxillary sinus mucosal thickening.  Major intracranial vascular structures are patent with small vertebral arteries and basilar artery.  Exophthalmos.  IMPRESSION:  No acute infarct.  Remote bilateral pontine infarcts most recent involving the left pons.  Left frontal subcutaneous hematoma.  No intracranial hemorrhage.  Global atrophy without hydrocephalus.  Altered signal intensity of bone marrow may represent result of prostate metastatic disease versus result of anemia from renal failure.  Please see above.   Original Report Authenticated By: Fuller Canada, M.D.    Scheduled:   . baclofen  5 mg Oral BID  . calcium carbonate  1 tablet Oral BID  . ciprofloxacin  250 mg Oral BID  . clopidogrel  75 mg Oral Daily  . diltiazem  300 mg Oral Daily  . enoxaparin (LOVENOX) injection  40 mg Subcutaneous Q24H  . feeding supplement  237 mL Oral TID BM  . furosemide  10 mg Oral Daily  . hydrALAZINE  50 mg Oral BID  . insulin aspart  0-9 Units Subcutaneous TID WC  .  insulin aspart  3 Units Subcutaneous TID WC  . insulin glargine  20 Units Subcutaneous QHS  . mycophenolate  180 mg Oral BID  . pantoprazole  40 mg Oral Q1200  . sodium polystyrene  15 g Oral Once  . tacrolimus  5 mg Oral BID  . DISCONTD: cefTRIAXone (ROCEPHIN)  IV  1 g Intravenous Q24H  . DISCONTD: furosemide  10 mg Oral Daily  . DISCONTD: hydrALAZINE  25 mg Oral BID  . DISCONTD: insulin aspart  0-9  Units Subcutaneous TID WC  . DISCONTD: insulin glargine  30 Units Subcutaneous QHS     LOS: 3 days   Lanelle Lindo C 03/28/2012,4:40 PM

## 2012-03-28 NOTE — Progress Notes (Signed)
Physical Therapy Treatment Patient Details Name: Evan [name] MRN: [id number] DOB: 25-Sep-1931 Today's Date: 03/28/2012 Time: 1213-1223 PT Time Calculation (min): 10 min  PT Assessment / Plan / Recommendation Comments on Treatment Session  Patient up in chair.  Agreed to perform exercises today.  Tolerated well.    Follow Up Recommendations  Skilled nursing facility    Barriers to Discharge        Equipment Recommendations  Defer to next venue    Recommendations for Other Services    Frequency Min 3X/week   Plan Discharge plan remains appropriate;Frequency remains appropriate    Precautions / Restrictions Precautions Precautions: Fall Restrictions Weight Bearing Restrictions: No       Mobility  Bed Mobility Bed Mobility: Not assessed (Patient up in chair)    Exercises General Exercises - Upper Extremity Shoulder Flexion: AROM;Both;10 reps;Seated Shoulder Horizontal ABduction: AROM;Both;10 reps;Seated Elbow Flexion: AROM;Both;10 reps;Seated Elbow Extension: AROM;Both;10 reps;Seated General Exercises - Lower Extremity Ankle Circles/Pumps: AROM;10 reps;Both;Seated Long Arc Quad: AROM;Both;10 reps;Seated Hip Flexion/Marching: AROM;Both;10 reps;Seated     PT Goals Additional Goals Additional Goal #1: Patient will perform exercises with min assist PT Goal: Additional Goal #1 - Progress: Progressing toward goal  Visit Information  Last PT Received On: 03/28/12 Assistance Needed: +2    Subjective Data  Subjective: We can do some exercises   Cognition  Overall Cognitive Status: Appears within functional limits for tasks assessed/performed Arousal/Alertness: Awake/alert Orientation Level: Appears intact for tasks assessed Behavior During Session: [facility] for tasks performed    Balance     End of Session PT - End of Session Activity Tolerance: Patient tolerated treatment well Patient left: in chair;with call bell/phone within reach;with family/visitor present    GP     Vena Austria 03/28/2012, 3:30 PM Durenda Hurt. Renaldo Fiddler, [facility] Acute [facility] Services Pager [phone]

## 2012-03-28 NOTE — Progress Notes (Signed)
[facility] PROGRESS NOTE  Evan [name] RUE:[id number] DOB: 09-Oct-1931 DOA: 03/25/2012   Assessment/Plan: Acute UTI (lower urinary tract infection)  (03/25/2012) -UA and urine cultures less than 100,000, But he has been on antibiotics for probable UTIs will change IV Rocephin to cipro orally.. -has remained afebrile, no leukocytosis.  Diabetes mellitus type II (01/24/2012) -continue glipizide , increase Lantus Lantus at a higher dose plus sliding scale. -CB's ACHS. D/c d5  History of CVA (cerebrovascular accident)/left pontine infarct June 20,2013 (01/24/2012) -continue Plavix. And physical therapy consult. -Get MRI of head showed no acute stroke  History of renal transplant (01/24/2012)/Acute renal failure (03/25/2012): - Renal was consulted agree with stopping Bactrim and restart PCP prophylaxis once he's over this infection with another agent besides Bactrim. -Creatinine continues to improve.  -prograf level WNL  New right-sided weakness: -He had a stroke on the left paracentral pontine area back on 01/24/2012 -I will go ahead and repeat MRI negative for stroke. -speech evaluation and PT SNF  H/O atrial fibrillation without current medication (01/24/2012) -currently rate controlled without any medication. Continue to monitor. On no anticoagulation.  Dehydration (03/25/2012) -Resolved with IV fluids.  Hyperkalemia (03/25/2012) -this only improved slightly with IV fluids. He was given one dose of Kayexalate now is resolved.  Chronic diastolic heart failure, NYHA class 2 (03/25/2012): -compensated decrease IV fluids.  Metabolic encephalopathy (03/25/2012) -Now resolved 2/2 to UTI  Sacral decubitus ulcer: Appreciate wound care consult continue silicone foam dressing . Family Communication: Spouse [phone] Disposition Plan: SNF     LOS: 3 days   Procedures:  MRI  CT head  CXR  CT neck  Antibiotics:  rocephin  Subjective: hiccups  resolved  Objective: Filed Vitals:   03/27/12 1358 03/27/12 2208 03/28/12 0549 03/28/12 0629  BP: 125/68 144/78 170/75 154/75  Pulse: 86 78 88 88  Temp: 98.8 F (37.1 C) 98.5 F (36.9 C) 98.2 F (36.8 C)   TempSrc: Oral Oral Oral   Resp: 18 19 21    Height:      Weight:   78.4 kg (172 lb 13.5 oz)   SpO2: 100% 100% 100%     Intake/Output Summary (Last 24 hours) at 03/28/12 1254 Last data filed at 03/28/12 1100  Gross per 24 hour  Intake 2010.75 ml  Output   3200 ml  Net -1189.25 ml   Weight change: 2.2 kg (4 lb 13.6 oz)  Exam:  General: Alert, awake, oriented x3, in no acute distress.  HEENT: No bruits, no goiter.  Heart: Regular rate and rhythm, without murmurs, rubs, gallops.  Lungs: Good air movement, bilateral air movement.  Neuro: right side weakness   Data Reviewed: Basic Metabolic Panel:  Lab 03/28/12 1478 03/27/12 0655 03/26/12 0657 03/25/12 2106 03/25/12 1434  NA 137 141 140 -- 135  K 4.9 4.6 -- -- --  CL 104 111 112 -- 106  CO2 21 18* 15* -- 18*  GLUCOSE 135* 105* 134* -- 342*  BUN 33* 35* 38* -- 48*  CREATININE 1.43* 1.54* 1.68* 1.66* 1.88*  CALCIUM 9.2 9.1 9.2 -- 9.2  MG -- -- -- -- --  PHOS 3.7 -- -- -- --   Liver Function Tests:  Lab 03/28/12 0725 03/25/12 1434  AST -- 12  ALT -- 13  ALKPHOS -- 64  BILITOT -- 0.2*  PROT -- 7.3  ALBUMIN 3.3* 3.3*   No results found for this basename: LIPASE:5,AMYLASE:5 in the last 168 hours No results found for this basename: AMMONIA:5 in the last  168 hours CBC:  Lab 03/25/12 2106 03/25/12 1434  WBC 7.8 8.2  NEUTROABS -- 6.3  HGB 10.7* 10.7*  HCT 31.5* 31.2*  MCV 76.6* 78.4  PLT 245 233   Cardiac Enzymes: No results found for this basename: CKTOTAL:5,CKMB:5,CKMBINDEX:5,TROPONINI:5 in the last 168 hours BNP: No components found with this basename: POCBNP:5 CBG:  Lab 03/28/12 1213 03/28/12 0759 03/27/12 2206 03/27/12 1649 03/27/12 1200  GLUCAP 220* 131* 243* 226* 191*    Recent Results (from  the past 240 hour(s))  URINE CULTURE     Status: Normal   Collection Time   03/25/12  3:52 PM      Component Value Range Status Comment   Specimen Description URINE, RANDOM   Final    Special Requests NONE   Final    Culture  Setup Time 03/25/2012 22:05   Final    Colony Count 9,000 COLONIES/ML   Final    Culture INSIGNIFICANT GROWTH   Final    Report Status 03/26/2012 FINAL   Final      Studies: Dg Chest 1 View  03/25/2012  *RADIOLOGY REPORT*  Clinical Data: 78 year old male with weakness and history of stroke.  CHEST - 1 VIEW  Comparison: 02/14/2012 and earlier.  Findings: AP view at 1457 hours.  Streaky retrocardiac opacity. Somewhat lower lung volumes overall.  Stable right lung base.  No pneumothorax, pulmonary edema or pleural effusion.  Cardiac size and mediastinal contours are within normal limits.  Visualized tracheal air column is within normal limits.  IMPRESSION: Lower lung volumes.  Streaky retrocardiac opacity could reflect atelectasis or infection/aspiration.   Original Report Authenticated By: Harley Hallmark, M.D.    Ct Head Wo Contrast  03/25/2012  *RADIOLOGY REPORT*  Clinical Data: Fall, increased weakness  CT HEAD WITHOUT CONTRAST  Technique:  Contiguous axial images were obtained from the base of the skull through the vertex without contrast.  Comparison: 03/18/2012  Findings: Interval decrease in size of the left frontal scalp hematoma.  No underlying fracture.  Mild brain atrophy and chronic microvascular white matter ischemic changes.  No acute intracranial hemorrhage, mass lesion, definite infarction, midline shift, herniation, hydrocephalus, or extra-axial fluid collection.  Gray- white matter differentiation maintained.  Cisterns patent.  No cerebellar abnormality.  Orbits are symmetric.  Mastoids and sinuses remain clear.  No depressed skull fracture.  IMPRESSION: Resolving left frontal scalp hematoma.  No acute intracranial finding.  Stable atrophy.   Original Report  Authenticated By: Judie Petit. Ruel Favors, M.D.    Ct Head Wo Contrast  03/18/2012  *RADIOLOGY REPORT*  Clinical Data: Fall, head laceration  CT HEAD WITHOUT CONTRAST,CT CERVICAL SPINE WITHOUT CONTRAST  Technique:  Contiguous axial images were obtained from the base of the skull through the vertex without contrast.,Technique: Multidetector CT imaging of the cervical spine was performed. Multiplanar CT image reconstructions were also generated.  Comparison: 01/24/2012  Findings: No skull fracture is noted.  There is a skin irregularity and soft tissue swelling left frontal scalp.  Subcutaneous hematoma in the frontal region measures 1.2 x 2.5 cm.  No intracranial hemorrhage, mass effect or midline shift.  No acute infarction.  No mass lesion is noted on this unenhanced scan.  Small lacunar infarct is noted in the left pons.  IMPRESSION: No acute intracranial abnormality.  There is soft tissue swelling subcutaneous stranding and subcutaneous hematoma in the left frontal region.  Hematoma measures 2.5 x 1.2 cm.  CT cervical spine without IV contrast:  Axial images of the cervical spine  shows no acute fracture or subluxation.  There is disc space flattening with disc calcifications at C5-C6 level.  Mild anterior and mild posterior spurring at C5-C6 level.  Anterior spurring noted lower endplate of the C4 vertebral body.  Mild disc space flattening with mild anterior spurring and mild posterior spurring at C6-C7 level.  Mild anterior spurring lower endplate of the C7 vertebral body.  No prevertebral soft tissue swelling.  Cervical airway is patent.  There is no pneumothorax in visualized lung apices.  Impression: 1.  No acute fracture or subluxation.  Degenerative changes as described above.  Original Report Authenticated By: Natasha Mead, M.D.   Ct Cervical Spine Wo Contrast  03/18/2012  *RADIOLOGY REPORT*  Clinical Data: Fall, head laceration  CT HEAD WITHOUT CONTRAST,CT CERVICAL SPINE WITHOUT CONTRAST  Technique:   Contiguous axial images were obtained from the base of the skull through the vertex without contrast.,Technique: Multidetector CT imaging of the cervical spine was performed. Multiplanar CT image reconstructions were also generated.  Comparison: 01/24/2012  Findings: No skull fracture is noted.  There is a skin irregularity and soft tissue swelling left frontal scalp.  Subcutaneous hematoma in the frontal region measures 1.2 x 2.5 cm.  No intracranial hemorrhage, mass effect or midline shift.  No acute infarction.  No mass lesion is noted on this unenhanced scan.  Small lacunar infarct is noted in the left pons.  IMPRESSION: No acute intracranial abnormality.  There is soft tissue swelling subcutaneous stranding and subcutaneous hematoma in the left frontal region.  Hematoma measures 2.5 x 1.2 cm.  CT cervical spine without IV contrast:  Axial images of the cervical spine shows no acute fracture or subluxation.  There is disc space flattening with disc calcifications at C5-C6 level.  Mild anterior and mild posterior spurring at C5-C6 level.  Anterior spurring noted lower endplate of the C4 vertebral body.  Mild disc space flattening with mild anterior spurring and mild posterior spurring at C6-C7 level.  Mild anterior spurring lower endplate of the C7 vertebral body.  No prevertebral soft tissue swelling.  Cervical airway is patent.  There is no pneumothorax in visualized lung apices.  Impression: 1.  No acute fracture or subluxation.  Degenerative changes as described above.  Original Report Authenticated By: Natasha Mead, M.D.    Scheduled Meds:    . baclofen  5 mg Oral BID  . calcium carbonate  1 tablet Oral BID  . ciprofloxacin  250 mg Oral BID  . clopidogrel  75 mg Oral Daily  . diltiazem  300 mg Oral Daily  . enoxaparin (LOVENOX) injection  40 mg Subcutaneous Q24H  . feeding supplement  237 mL Oral TID BM  . hydrALAZINE  50 mg Oral BID  . insulin aspart  0-9 Units Subcutaneous TID WC  . insulin  aspart  3 Units Subcutaneous TID WC  . insulin glargine  20 Units Subcutaneous QHS  . mycophenolate  180 mg Oral BID  . pantoprazole  40 mg Oral Q1200  . sodium polystyrene  15 g Oral Once  . tacrolimus  5 mg Oral BID  . DISCONTD: cefTRIAXone (ROCEPHIN)  IV  1 g Intravenous Q24H  . DISCONTD: hydrALAZINE  25 mg Oral BID  . DISCONTD: insulin aspart  0-9 Units Subcutaneous TID WC  . DISCONTD: insulin glargine  20 Units Subcutaneous QHS  . DISCONTD: insulin glargine  30 Units Subcutaneous QHS   Continuous Infusions:    . dextrose 5 % 1,000 mL with sodium bicarbonate 150  mEq infusion 75 mL/hr at 03/28/12 0500  . DISCONTD:  sodium bicarbonate infusion sterile water 1000 mL 50 mL/hr at 03/26/12 2053    Lambert Keto, MD  [facility] Pager [phone]  If 7PM-7AM, please contact night-coverage [email or web address] Password [facility] 03/28/2012, 12:54 PM

## 2012-03-28 NOTE — Progress Notes (Signed)
Clinical Social Work: Coverage for [id number]  Patient not medically stable for DC today, however per MD patient ready Saturday for DC. [facility], made them aware and MD to complete dc summary on 8/23 and will fax to facility per [facility].  Facility agreeable to accept patient back on 8/24, prefers early in AM as possible.  Completed dc packet and weekend CSW to facilitate dc. Wife aware of dc and agreeable.   Will meet with wife around 1pm to answer questions per staff that she had.  Will follow up. Anticipate dc 8/24 to [facility].  Ashley Jacobs, MSW LCSW [phone]

## 2012-03-28 NOTE — Discharge Summary (Addendum)
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 (hospital follow up)    Contact information:   [address], [address] [place] [address] [phone]       Follow up with Zada Girt, MD in 2 weeks. (hospital follow up)    Contact information:   [address] [employer] [place] [address] [phone]          Diet:low carb renal diet Wt Readings from Last 3 Encounters:  03/29/12 78.2 kg (172 lb 6.4 oz)  03/04/12 76.998 kg (169 lb 12 oz)  02/13/12 77 kg (169 lb 12.1 oz)    History of present illness:  78/AA male recently discharged to [facility] SN[facility] from CIR 7/10 after sustaining a left pontine infarct 01/24/2012 w/ R hemiplegia and dysarthria.  He presented to the emergency department one week ago after a fall but was discharged back to [facility]. He presents back to [facility] ER with lethargy and weakness noted today at SNF, his wife thinks that he has not been as interactive and alert for last 3-4 days. He was started on bactrim for UTI 3-4 days ago at [facility].  Upon evaluation in ER he was found to have slight worsening of his renal  failure, hyperkalemia and a urinalysis was consistent with a urinary tract infection. During his last hospitalization and inpatient rehabilitation stay patient had 2 urinary tract infections   Hospital Course:  Acute UTI (lower urinary tract infection) (03/25/2012) -UA and urine cultures less than 100,000,  -But he has been on antibiotics for probable UTIs started on  IV Rocephin on august 20th to cipro orally on 8/23. -treat for 10 days.  -may need chronic suppressive therapy follow up with urology and renal.  Diabetes mellitus type II (01/24/2012) -continue glipizide , increase Lantus Lantus at a higher dose plus sliding scale.   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 And physical therapy consult.  -Get MRI of head showed no acute stroke. -pt recommended SNF  History of renal transplant (01/24/2012)/Acute renal failure (03/25/2012): - Renal was consulted agree with stopping Bactrim and restart PCP prophylaxis once he's over this infection with another agent besides Bactrim.  -Creatinine at baseline. -prograf level WNL   H/O atrial fibrillation without current medication (01/24/2012) -currently rate controlled without any medication. Continue to monitor. On no anticoagulation.   Dehydration (03/25/2012) -Resolved with IV fluids.   Hyperkalemia (03/25/2012) -this only improved slightly with IV fluids. He was given one dose of Kayexalate now is resolved.   Chronic diastolic heart failure, NYHA class 2 (03/25/2012): -compensated.  Metabolic encephalopathy (03/25/2012) -Now resolved 2/2 to UTI.  Sacral decubitus ulcer:  Appreciate wound care consult continue silicone foam dressing    Discharge  Exam: Filed Vitals:   03/29/12 0600  BP: 151/72  Pulse: 73  Temp: 98.7 F (37.1 C)  Resp: 20   Filed Vitals:   03/28/12 0629 03/28/12 1425 03/28/12 2123 03/29/12 0600  BP: 154/75 107/65 130/66 151/72  Pulse: 88 74 77 73   Temp:  99.1 F (37.3 C) 98.6 F (37 C) 98.7 F (37.1 C)  TempSrc:  Oral Oral Oral  Resp:  18 20 20   Height:      Weight:    78.2 kg (172 lb 6.4 oz)  SpO2:  100% 99% 100%   General: A & O X3 Cardiovascular: RRR Respiratory: good air movement CTA B/l  Discharge Instructions  Discharge Orders    Future Appointments: Provider: Department: Dept Phone: Center:   04/29/2012 12:20 PM Ranelle Oyster, MD [facility] [phone] CPR     Future Orders Please Complete By Expires   Diet - low sodium heart healthy      Increase activity slowly        Medication List  As of 03/29/2012  9:29 AM   STOP taking these medications         sulfamethoxazole-trimethoprim 400-80 MG per tablet         TAKE these medications         acetaminophen 500 MG tablet   Commonly known as: TYLENOL   Take 1,000 mg by mouth every 6 (six) hours as needed. For pain      baclofen 20 MG tablet   Commonly known as: LIORESAL   Take 20 mg by mouth 2 (two) times daily as needed. For hiccups      calcium carbonate 750 MG chewable tablet   Commonly known as: TUMS EX   Chew 1 tablet by mouth 3 (three) times daily.      ciprofloxacin 250 MG tablet   Commonly known as: CIPRO   Take 1 tablet (250 mg total) by mouth 2 (two) times daily.      clopidogrel 75 MG tablet   Commonly known as: PLAVIX   Take 75 mg by mouth daily.      diltiazem 300 MG 24 hr capsule   Commonly known as: TIAZAC   Take 300 mg by mouth daily.      furosemide 20 MG tablet   Commonly known as: LASIX   Take 10 mg by mouth daily.      glipiZIDE 10 MG tablet   Commonly known as: GLUCOTROL   Take 10 mg by mouth 2 (two) times daily before a meal.      hydrALAZINE 25 MG tablet   Commonly known as: APRESOLINE   Take 25 mg by mouth 2 (two) times daily.      insulin aspart 100 UNIT/ML injection   Commonly known as: novoLOG   Inject 5-10 Units into the skin 3 (three) times daily before meals. cbg 150-300 5 units; 301-400 8  units; 401-500 10 units      insulin glargine 100 UNIT/ML injection   Commonly known as: LANTUS   Inject 20 Units into the skin at bedtime. CBG Over 200 5 units      loratadine 10 MG tablet   Commonly known as: CLARITIN   Take 10 mg by mouth daily as needed. allergies      megestrol 400 MG/10ML suspension   Commonly known as: MEGACE   Take 400 mg by mouth daily as needed. For appetite      mycophenolate 180 MG EC tablet  Commonly known as: MYFORTIC   Take 180 mg by mouth 2 (two) times daily.      pantoprazole 40 MG tablet   Commonly known as: PROTONIX   Take 40 mg by mouth daily.      polyethylene glycol packet   Commonly known as: MIRALAX / GLYCOLAX   Take 17 g by mouth daily as needed. constipation      sodium bicarbonate 650 MG tablet   Take 1,300 mg by mouth 2 (two) times daily.      tacrolimus 5 MG capsule   Commonly known as: PROGRAF   Take 5 mg by mouth 2 (two) times daily.              The results of significant diagnostics from this hospitalization (including imaging, microbiology, ancillary and laboratory) are listed below for reference.    Significant Diagnostic Studies: Dg Chest 1 View  03/25/2012  *RADIOLOGY REPORT*  Clinical Data: 78 year old male with weakness and history of stroke.  CHEST - 1 VIEW  Comparison: 02/14/2012 and earlier.  Findings: AP view at 1457 hours.  Streaky retrocardiac opacity. Somewhat lower lung volumes overall.  Stable right lung base.  No pneumothorax, pulmonary edema or pleural effusion.  Cardiac size and mediastinal contours are within normal limits.  Visualized tracheal air column is within normal limits.  IMPRESSION: Lower lung volumes.  Streaky retrocardiac opacity could reflect atelectasis or infection/aspiration.   Original Report Authenticated By: Harley Hallmark, M.D.    Ct Head Wo Contrast  03/25/2012  *RADIOLOGY REPORT*  Clinical Data: Fall, increased weakness  CT HEAD WITHOUT CONTRAST  Technique:  Contiguous axial  images were obtained from the base of the skull through the vertex without contrast.  Comparison: 03/18/2012  Findings: Interval decrease in size of the left frontal scalp hematoma.  No underlying fracture.  Mild brain atrophy and chronic microvascular white matter ischemic changes.  No acute intracranial hemorrhage, mass lesion, definite infarction, midline shift, herniation, hydrocephalus, or extra-axial fluid collection.  Gray- white matter differentiation maintained.  Cisterns patent.  No cerebellar abnormality.  Orbits are symmetric.  Mastoids and sinuses remain clear.  No depressed skull fracture.  IMPRESSION: Resolving left frontal scalp hematoma.  No acute intracranial finding.  Stable atrophy.   Original Report Authenticated By: Judie Petit. Ruel Favors, M.D.    Ct Head Wo Contrast  03/18/2012  *RADIOLOGY REPORT*  Clinical Data: Fall, head laceration  CT HEAD WITHOUT CONTRAST,CT CERVICAL SPINE WITHOUT CONTRAST  Technique:  Contiguous axial images were obtained from the base of the skull through the vertex without contrast.,Technique: Multidetector CT imaging of the cervical spine was performed. Multiplanar CT image reconstructions were also generated.  Comparison: 01/24/2012  Findings: No skull fracture is noted.  There is a skin irregularity and soft tissue swelling left frontal scalp.  Subcutaneous hematoma in the frontal region measures 1.2 x 2.5 cm.  No intracranial hemorrhage, mass effect or midline shift.  No acute infarction.  No mass lesion is noted on this unenhanced scan.  Small lacunar infarct is noted in the left pons.  IMPRESSION: No acute intracranial abnormality.  There is soft tissue swelling subcutaneous stranding and subcutaneous hematoma in the left frontal region.  Hematoma measures 2.5 x 1.2 cm.  CT cervical spine without IV contrast:  Axial images of the cervical spine shows no acute fracture or subluxation.  There is disc space flattening with disc calcifications at C5-C6 level.  Mild  anterior and mild posterior spurring at C5-C6 level.  Anterior spurring  noted lower endplate of the C4 vertebral body.  Mild disc space flattening with mild anterior spurring and mild posterior spurring at C6-C7 level.  Mild anterior spurring lower endplate of the C7 vertebral body.  No prevertebral soft tissue swelling.  Cervical airway is patent.  There is no pneumothorax in visualized lung apices.  Impression: 1.  No acute fracture or subluxation.  Degenerative changes as described above.  Original Report Authenticated By: Natasha Mead, M.D.   Ct Cervical Spine Wo Contrast  03/18/2012  *RADIOLOGY REPORT*  Clinical Data: Fall, head laceration  CT HEAD WITHOUT CONTRAST,CT CERVICAL SPINE WITHOUT CONTRAST  Technique:  Contiguous axial images were obtained from the base of the skull through the vertex without contrast.,Technique: Multidetector CT imaging of the cervical spine was performed. Multiplanar CT image reconstructions were also generated.  Comparison: 01/24/2012  Findings: No skull fracture is noted.  There is a skin irregularity and soft tissue swelling left frontal scalp.  Subcutaneous hematoma in the frontal region measures 1.2 x 2.5 cm.  No intracranial hemorrhage, mass effect or midline shift.  No acute infarction.  No mass lesion is noted on this unenhanced scan.  Small lacunar infarct is noted in the left pons.  IMPRESSION: No acute intracranial abnormality.  There is soft tissue swelling subcutaneous stranding and subcutaneous hematoma in the left frontal region.  Hematoma measures 2.5 x 1.2 cm.  CT cervical spine without IV contrast:  Axial images of the cervical spine shows no acute fracture or subluxation.  There is disc space flattening with disc calcifications at C5-C6 level.  Mild anterior and mild posterior spurring at C5-C6 level.  Anterior spurring noted lower endplate of the C4 vertebral body.  Mild disc space flattening with mild anterior spurring and mild posterior spurring at C6-C7 level.   Mild anterior spurring lower endplate of the C7 vertebral body.  No prevertebral soft tissue swelling.  Cervical airway is patent.  There is no pneumothorax in visualized lung apices.  Impression: 1.  No acute fracture or subluxation.  Degenerative changes as described above.  Original Report Authenticated By: Natasha Mead, M.D.   Mr Brain Wo Contrast  03/27/2012  *RADIOLOGY REPORT*  Clinical Data: Right-sided weakness.  Fall 1 week ago.  History of pontine infarct 01/2012.  Resulting of the right hemiplegia and dysarthria.  Renal failure.  Prostate cancer.  MRI HEAD WITHOUT CONTRAST  Technique:  Multiplanar, multiecho pulse sequences of the brain and surrounding structures were obtained according to standard protocol without intravenous contrast.  Comparison: 03/25/2012 CT.  01/24/2012 MR.  Findings: No acute infarct.  Remote bilateral pontine infarcts most recent involving the left pons.  Minimal interval improvement of altered signal intensity previously noted involving the occipital lobes.  Mild small vessel disease type changes.  Left frontal subcutaneous hematoma.  No intracranial hemorrhage.  Global atrophy without hydrocephalus.  Altered signal intensity of bone marrow may represent result of prostate metastatic disease versus result of anemia from renal failure.  Partial opacification right and mastoid air cells.  No obstructing lesion noted in the posterior-superior nasopharynx causing eustachian tube dysfunction.  Minimal ethmoid sinus air cells and maxillary sinus mucosal thickening.  Major intracranial vascular structures are patent with small vertebral arteries and basilar artery.  Exophthalmos.  IMPRESSION:  No acute infarct.  Remote bilateral pontine infarcts most recent involving the left pons.  Left frontal subcutaneous hematoma.  No intracranial hemorrhage.  Global atrophy without hydrocephalus.  Altered signal intensity of bone marrow may represent result of prostate metastatic  disease versus  result of anemia from renal failure.  Please see above.   Original Report Authenticated By: Fuller Canada, M.D.     Microbiology: Recent Results (from the past 240 hour(s))  URINE CULTURE     Status: Normal   Collection Time   03/25/12  3:52 PM      Component Value Range Status Comment   Specimen Description URINE, RANDOM   Final    Special Requests NONE   Final    Culture  Setup Time 03/25/2012 22:05   Final    Colony Count 9,000 COLONIES/ML   Final    Culture INSIGNIFICANT GROWTH   Final    Report Status 03/26/2012 FINAL   Final      Labs: Basic Metabolic Panel:  Lab 03/28/12 4098 03/27/12 0655 03/26/12 0657 03/25/12 2106 03/25/12 1434  NA 137 141 140 -- 135  K 4.9 4.6 -- -- --  CL 104 111 112 -- 106  CO2 21 18* 15* -- 18*  GLUCOSE 135* 105* 134* -- 342*  BUN 33* 35* 38* -- 48*  CREATININE 1.43* 1.54* 1.68* 1.66* 1.88*  CALCIUM 9.2 9.1 9.2 -- 9.2  MG -- -- -- -- --  PHOS 3.7 -- -- -- --   Liver Function Tests:  Lab 03/28/12 0725 03/25/12 1434  AST -- 12  ALT -- 13  ALKPHOS -- 64  BILITOT -- 0.2*  PROT -- 7.3  ALBUMIN 3.3* 3.3*   No results found for this basename: LIPASE:5,AMYLASE:5 in the last 168 hours No results found for this basename: AMMONIA:5 in the last 168 hours CBC:  Lab 03/25/12 2106 03/25/12 1434  WBC 7.8 8.2  NEUTROABS -- 6.3  HGB 10.7* 10.7*  HCT 31.5* 31.2*  MCV 76.6* 78.4  PLT 245 233   Cardiac Enzymes: No results found for this basename: CKTOTAL:5,CKMB:5,CKMBINDEX:5,TROPONINI:5 in the last 168 hours BNP: No components found with this basename: POCBNP:5 CBG:  Lab 03/29/12 0743 03/28/12 2138 03/28/12 1648 03/28/12 1213 03/28/12 0759  GLUCAP 110* 313* 226* 220* 131*    Time coordinating discharge: greater than 45 min  Signed:  Marinda Elk  [facility] 03/29/2012, 9:29 AM

## 2012-03-29 LAB — GLUCOSE, CAPILLARY: Glucose-Capillary: 110 mg/dL — ABNORMAL HIGH (ref 70–99)

## 2012-03-29 NOTE — Progress Notes (Signed)
CSW was consulted to complete discharge of patient. Pt to transfer to [facility] today via [facility]. [facility] is aware of d/c. D/C packet complete with chart copy, and signed FL2. CSW discussed d/c with patient and spouse at bedside. CSW signing off as no other CSW needs identified at this time.  Lia Foyer, LCSWA [facility] Clinical Social Worker Contact #: [phone] (weekend)

## 2012-03-29 NOTE — Progress Notes (Signed)
[name] discharged Skilled nursing facility per MD order. Copy of instructions and packet given to [facility] transporters.   Kel[place], Bibby  Home Medication Instructions [id number]   Printed on:03/29/12 1051  Medication Information                    megestrol (MEGACE) 400 MG/10ML suspension Take 400 mg by mouth daily as needed. For appetite           mycophenolate (MYFORTIC) 180 MG EC tablet Take 180 mg by mouth 2 (two) times daily.           polyethylene glycol (MIRALAX / GLYCOLAX) packet Take 17 g by mouth daily as needed. constipation           acetaminophen (TYLENOL) 500 MG tablet Take 1,000 mg by mouth every 6 (six) hours as needed. For pain           calcium carbonate (TUMS EX) 750 MG chewable tablet Chew 1 tablet by mouth 3 (three) times daily.           loratadine (CLARITIN) 10 MG tablet Take 10 mg by mouth daily as needed. allergies           clopidogrel (PLAVIX) 75 MG tablet Take 75 mg by mouth daily.           diltiazem (TIAZAC) 300 MG 24 hr capsule Take 300 mg by mouth daily.            hydrALAZINE (APRESOLINE) 25 MG tablet Take 25 mg by mouth 2 (two) times daily.           furosemide (LASIX) 20 MG tablet Take 10 mg by mouth daily.           glipiZIDE (GLUCOTROL) 10 MG tablet Take 10 mg by mouth 2 (two) times daily before a meal.           tacrolimus (PROGRAF) 5 MG capsule Take 5 mg by mouth 2 (two) times daily.           sodium bicarbonate 650 MG tablet Take 1,300 mg by mouth 2 (two) times daily.           baclofen (LIORESAL) 20 MG tablet Take 20 mg by mouth 2 (two) times daily as needed. For hiccups           insulin aspart (NOVOLOG) 100 UNIT/ML injection Inject 5-10 Units into the skin 3 (three) times daily before meals. cbg 150-300 5 units; 301-400 8 units; 401-500 10 units           pantoprazole (PROTONIX) 40 MG tablet Take 40 mg by mouth daily.           ciprofloxacin (CIPRO) 250 MG tablet Take 1 tablet (250 mg total) by mouth 2 (two)  times daily.           insulin glargine (LANTUS) 100 UNIT/ML injection Inject 20 Units into the skin at bedtime. CBG Over 200 5 units             Patients skin is clean, dry with a stage 2 to his right buttocks. 100% red 1x0.5 covered with an alleyvn. IV site discontinued and catheter remains intact. Site without signs and symptoms of complications. Dressing and pressure applied.  Patient escorted to ambulance by [facility] transporters on a stretcher,  no distress noted upon discharge.  Julien Nordmann [facility] 03/29/2012 10:51 AM

## 2012-04-08 ENCOUNTER — Encounter (HOSPITAL_COMMUNITY): Payer: Self-pay

## 2012-04-08 ENCOUNTER — Emergency Department (HOSPITAL_COMMUNITY): Payer: Medicare Other

## 2012-04-08 ENCOUNTER — Inpatient Hospital Stay (HOSPITAL_COMMUNITY)
Admission: EM | Admit: 2012-04-08 | Discharge: 2012-04-11 | DRG: 194 | Disposition: A | Discharge status: Skilled Nursing Facility | Payer: Medicare Other | Attending: Internal Medicine | Admitting: Internal Medicine

## 2012-04-08 DIAGNOSIS — R627 Adult failure to thrive: Secondary | ICD-10-CM

## 2012-04-08 DIAGNOSIS — E875 Hyperkalemia: Secondary | ICD-10-CM

## 2012-04-08 DIAGNOSIS — Z8679 Personal history of other diseases of the circulatory system: Secondary | ICD-10-CM

## 2012-04-08 DIAGNOSIS — E860 Dehydration: Secondary | ICD-10-CM

## 2012-04-08 DIAGNOSIS — Z7902 Long term (current) use of antithrombotics/antiplatelets: Secondary | ICD-10-CM

## 2012-04-08 DIAGNOSIS — Z8546 Personal history of malignant neoplasm of prostate: Secondary | ICD-10-CM

## 2012-04-08 DIAGNOSIS — D631 Anemia in chronic kidney disease: Secondary | ICD-10-CM | POA: Diagnosis present

## 2012-04-08 DIAGNOSIS — I350 Nonrheumatic aortic (valve) stenosis: Secondary | ICD-10-CM

## 2012-04-08 DIAGNOSIS — N179 Acute kidney failure, unspecified: Secondary | ICD-10-CM

## 2012-04-08 DIAGNOSIS — I5032 Chronic diastolic (congestive) heart failure: Secondary | ICD-10-CM | POA: Diagnosis present

## 2012-04-08 DIAGNOSIS — N390 Urinary tract infection, site not specified: Secondary | ICD-10-CM

## 2012-04-08 DIAGNOSIS — I4891 Unspecified atrial fibrillation: Secondary | ICD-10-CM | POA: Diagnosis present

## 2012-04-08 DIAGNOSIS — W19XXXA Unspecified fall, initial encounter: Secondary | ICD-10-CM

## 2012-04-08 DIAGNOSIS — I359 Nonrheumatic aortic valve disorder, unspecified: Secondary | ICD-10-CM | POA: Diagnosis present

## 2012-04-08 DIAGNOSIS — N189 Chronic kidney disease, unspecified: Secondary | ICD-10-CM | POA: Diagnosis present

## 2012-04-08 DIAGNOSIS — Z940 Kidney transplant status: Secondary | ICD-10-CM

## 2012-04-08 DIAGNOSIS — C61 Malignant neoplasm of prostate: Secondary | ICD-10-CM

## 2012-04-08 DIAGNOSIS — G9341 Metabolic encephalopathy: Secondary | ICD-10-CM

## 2012-04-08 DIAGNOSIS — E119 Type 2 diabetes mellitus without complications: Secondary | ICD-10-CM | POA: Diagnosis present

## 2012-04-08 DIAGNOSIS — Z8719 Personal history of other diseases of the digestive system: Secondary | ICD-10-CM

## 2012-04-08 DIAGNOSIS — T8610 Unspecified complication of kidney transplant: Secondary | ICD-10-CM | POA: Diagnosis present

## 2012-04-08 DIAGNOSIS — Z87891 Personal history of nicotine dependence: Secondary | ICD-10-CM

## 2012-04-08 DIAGNOSIS — Z8673 Personal history of transient ischemic attack (TIA), and cerebral infarction without residual deficits: Secondary | ICD-10-CM

## 2012-04-08 DIAGNOSIS — I639 Cerebral infarction, unspecified: Secondary | ICD-10-CM

## 2012-04-08 DIAGNOSIS — J189 Pneumonia, unspecified organism: Principal | ICD-10-CM | POA: Diagnosis present

## 2012-04-08 DIAGNOSIS — I509 Heart failure, unspecified: Secondary | ICD-10-CM | POA: Diagnosis present

## 2012-04-08 DIAGNOSIS — M1710 Unilateral primary osteoarthritis, unspecified knee: Secondary | ICD-10-CM

## 2012-04-08 HISTORY — DX: Cardiac arrhythmia, unspecified: I49.9

## 2012-04-08 HISTORY — DX: Pneumonia, unspecified organism: J18.9

## 2012-04-08 HISTORY — DX: Essential (primary) hypertension: I10

## 2012-04-08 HISTORY — DX: Shortness of breath: R06.02

## 2012-04-08 HISTORY — DX: Unspecified osteoarthritis, unspecified site: M19.90

## 2012-04-08 HISTORY — DX: Cardiac murmur, unspecified: R01.1

## 2012-04-08 HISTORY — DX: Gastro-esophageal reflux disease without esophagitis: K21.9

## 2012-04-08 LAB — LACTIC ACID, PLASMA: Lactic Acid, Venous: 0.8 mmol/L (ref 0.5–2.2)

## 2012-04-08 LAB — CBC WITH DIFFERENTIAL/PLATELET
Eosinophils Absolute: 0.1 10*3/uL (ref 0.0–0.7)
Hemoglobin: 10 g/dL — ABNORMAL LOW (ref 13.0–17.0)
Lymphocytes Relative: 12 % (ref 12–46)
Lymphs Abs: 1.2 10*3/uL (ref 0.7–4.0)
MCH: 26.6 pg (ref 26.0–34.0)
Monocytes Relative: 5 % (ref 3–12)
Neutro Abs: 8.7 10*3/uL — ABNORMAL HIGH (ref 1.7–7.7)
Neutrophils Relative %: 82 % — ABNORMAL HIGH (ref 43–77)
RBC: 3.76 MIL/uL — ABNORMAL LOW (ref 4.22–5.81)

## 2012-04-08 LAB — GLUCOSE, CAPILLARY
Glucose-Capillary: 149 mg/dL — ABNORMAL HIGH (ref 70–99)
Glucose-Capillary: 130 mg/dL — ABNORMAL HIGH (ref 70–99)

## 2012-04-08 LAB — COMPREHENSIVE METABOLIC PANEL
AST: 18 U/L (ref 0–37)
AST: 16 U/L (ref 0–37)
Albumin: 3.4 g/dL — ABNORMAL LOW (ref 3.5–5.2)
CO2: 18 mEq/L — ABNORMAL LOW (ref 19–32)
Calcium: 9.6 mg/dL (ref 8.4–10.5)
Calcium: 9.6 mg/dL (ref 8.4–10.5)
Creatinine, Ser: 1.43 mg/dL — ABNORMAL HIGH (ref 0.50–1.35)
Creatinine, Ser: 1.43 mg/dL — ABNORMAL HIGH (ref 0.50–1.35)
GFR calc non Af Amer: 45 mL/min — ABNORMAL LOW (ref >90)

## 2012-04-08 LAB — CBC
HCT: 29.2 % — ABNORMAL LOW (ref 39.0–52.0)
Hemoglobin: 9.7 g/dL — ABNORMAL LOW (ref 13.0–17.0)
MCH: 26.2 pg (ref 26.0–34.0)
MCV: 78.9 fL (ref 78.0–100.0)
RBC: 3.7 MIL/uL — ABNORMAL LOW (ref 4.22–5.81)

## 2012-04-08 LAB — TROPONIN I
Troponin I: <0.3 ng/mL (ref <0.30)
Troponin I: <0.3 ng/mL (ref <0.30)

## 2012-04-08 LAB — LIPASE, BLOOD: Lipase: 21 U/L (ref 11–59)

## 2012-04-08 MED ORDER — CLOPIDOGREL BISULFATE 75 MG PO TABS
75.0000 mg | ORAL_TABLET | Freq: Every day | ORAL | Status: DC
  Administered 2012-04-08 – 2012-04-11 (×4): 75 mg via ORAL
  Filled 2012-04-08 (×4): qty 1

## 2012-04-08 MED ORDER — MYCOPHENOLATE SODIUM 180 MG PO TBEC
180.0000 mg | DELAYED_RELEASE_TABLET | Freq: Two times a day (BID) | ORAL | Status: DC
  Administered 2012-04-08 – 2012-04-11 (×7): 180 mg via ORAL
  Filled 2012-04-08 (×8): qty 1

## 2012-04-08 MED ORDER — FUROSEMIDE 20 MG PO TABS
10.0000 mg | ORAL_TABLET | Freq: Every day | ORAL | Status: DC
  Administered 2012-04-08 – 2012-04-11 (×4): 10 mg via ORAL
  Filled 2012-04-08 (×4): qty 0.5

## 2012-04-08 MED ORDER — ONDANSETRON HCL 4 MG/2ML IJ SOLN: 4.0000 mg | Freq: Four times a day (QID) | INTRAMUSCULAR | Status: DC | PRN

## 2012-04-08 MED ORDER — VANCOMYCIN HCL IN DEXTROSE 1-5 GM/200ML-% IV SOLN
1000.0000 mg | Freq: Once | INTRAVENOUS | Status: AC
  Administered 2012-04-08: 1000 mg via INTRAVENOUS
  Filled 2012-04-08: qty 200

## 2012-04-08 MED ORDER — CALCIUM CARBONATE ANTACID 500 MG PO CHEW
1.0000 | CHEWABLE_TABLET | Freq: Three times a day (TID) | ORAL | Status: DC
  Administered 2012-04-08 – 2012-04-11 (×10): 200 mg via ORAL
  Filled 2012-04-08 (×12): qty 1

## 2012-04-08 MED ORDER — PIPERACILLIN-TAZOBACTAM 3.375 G IVPB
3.3750 g | Freq: Once | INTRAVENOUS | Status: AC
  Administered 2012-04-08: 3.375 g via INTRAVENOUS
  Filled 2012-04-08: qty 50

## 2012-04-08 MED ORDER — GLIPIZIDE 10 MG PO TABS
10.0000 mg | ORAL_TABLET | Freq: Two times a day (BID) | ORAL | Status: DC
  Administered 2012-04-08 – 2012-04-09 (×3): 10 mg via ORAL
  Filled 2012-04-08 (×4): qty 1

## 2012-04-08 MED ORDER — ONDANSETRON HCL 4 MG/2ML IJ SOLN: 4.0000 mg | Freq: Three times a day (TID) | INTRAMUSCULAR | Status: DC | PRN

## 2012-04-08 MED ORDER — ACETAMINOPHEN 650 MG RE SUPP: 650.0000 mg | Freq: Four times a day (QID) | RECTAL | Status: DC | PRN

## 2012-04-08 MED ORDER — SODIUM CHLORIDE 0.9 % IJ SOLN
3.0000 mL | Freq: Two times a day (BID) | INTRAMUSCULAR | Status: DC
  Administered 2012-04-08 – 2012-04-11 (×5): 3 mL via INTRAVENOUS

## 2012-04-08 MED ORDER — LORATADINE 10 MG PO TABS
10.0000 mg | ORAL_TABLET | Freq: Every day | ORAL | Status: DC | PRN
  Administered 2012-04-08: 10 mg via ORAL
  Filled 2012-04-08: qty 1

## 2012-04-08 MED ORDER — ACETAMINOPHEN 325 MG PO TABS
650.0000 mg | ORAL_TABLET | Freq: Once | ORAL | Status: AC
  Administered 2012-04-08: 650 mg via ORAL
  Filled 2012-04-08: qty 2

## 2012-04-08 MED ORDER — VANCOMYCIN HCL 1000 MG IV SOLR
750.0000 mg | Freq: Two times a day (BID) | INTRAVENOUS | Status: DC
  Administered 2012-04-08 – 2012-04-10 (×4): 750 mg via INTRAVENOUS
  Filled 2012-04-08 (×6): qty 750

## 2012-04-08 MED ORDER — SODIUM CHLORIDE 0.9 % IJ SOLN: 3.0000 mL | Freq: Two times a day (BID) | INTRAMUSCULAR | Status: DC

## 2012-04-08 MED ORDER — PANTOPRAZOLE SODIUM 40 MG PO TBEC
40.0000 mg | DELAYED_RELEASE_TABLET | Freq: Every day | ORAL | Status: DC
  Administered 2012-04-08 – 2012-04-11 (×4): 40 mg via ORAL
  Filled 2012-04-08 (×4): qty 1

## 2012-04-08 MED ORDER — LEVOFLOXACIN IN D5W 750 MG/150ML IV SOLN
750.0000 mg | INTRAVENOUS | Status: DC
  Administered 2012-04-08: 750 mg via INTRAVENOUS
  Filled 2012-04-08: qty 150

## 2012-04-08 MED ORDER — SODIUM CHLORIDE 0.9 % IV SOLN
INTRAVENOUS | Status: AC
  Administered 2012-04-08: 05:00:00 via INTRAVENOUS

## 2012-04-08 MED ORDER — DILTIAZEM HCL ER BEADS 300 MG PO CP24
300.0000 mg | ORAL_CAPSULE | Freq: Every day | ORAL | Status: DC
  Administered 2012-04-08 – 2012-04-11 (×4): 300 mg via ORAL
  Filled 2012-04-08 (×4): qty 1

## 2012-04-08 MED ORDER — MEGESTROL ACETATE 400 MG/10ML PO SUSP
400.0000 mg | Freq: Every day | ORAL | Status: DC
  Administered 2012-04-08 – 2012-04-11 (×4): 400 mg via ORAL
  Filled 2012-04-08 (×4): qty 10

## 2012-04-08 MED ORDER — ONDANSETRON HCL 4 MG PO TABS: 4.0000 mg | ORAL_TABLET | Freq: Four times a day (QID) | ORAL | Status: DC | PRN

## 2012-04-08 MED ORDER — SODIUM BICARBONATE 650 MG PO TABS
1300.0000 mg | ORAL_TABLET | Freq: Two times a day (BID) | ORAL | Status: DC
  Administered 2012-04-08 – 2012-04-11 (×7): 1300 mg via ORAL
  Filled 2012-04-08 (×8): qty 2

## 2012-04-08 MED ORDER — TACROLIMUS 1 MG PO CAPS
5.0000 mg | ORAL_CAPSULE | Freq: Two times a day (BID) | ORAL | Status: DC
  Administered 2012-04-08: 5 mg via ORAL
  Filled 2012-04-08 (×2): qty 5

## 2012-04-08 MED ORDER — BACLOFEN 20 MG PO TABS: 20.0000 mg | ORAL_TABLET | Freq: Two times a day (BID) | ORAL | Status: DC | PRN

## 2012-04-08 MED ORDER — INSULIN ASPART 100 UNIT/ML ~~LOC~~ SOLN
0.0000 [IU] | Freq: Three times a day (TID) | SUBCUTANEOUS | Status: DC
  Administered 2012-04-08: 2 [IU] via SUBCUTANEOUS
  Administered 2012-04-10: 1 [IU] via SUBCUTANEOUS
  Administered 2012-04-11: 2 [IU] via SUBCUTANEOUS

## 2012-04-08 MED ORDER — ACETAMINOPHEN 325 MG PO TABS: 650.0000 mg | ORAL_TABLET | Freq: Four times a day (QID) | ORAL | Status: DC | PRN

## 2012-04-08 MED ORDER — DEXTROSE 5 % IV SOLN
1.0000 g | INTRAVENOUS | Status: DC
  Administered 2012-04-08 – 2012-04-11 (×4): 1 g via INTRAVENOUS
  Filled 2012-04-08 (×4): qty 1

## 2012-04-08 MED ORDER — BACLOFEN 20 MG PO TABS
20.0000 mg | ORAL_TABLET | Freq: Two times a day (BID) | ORAL | Status: DC | PRN
  Administered 2012-04-08 – 2012-04-11 (×4): 20 mg via ORAL
  Filled 2012-04-08 (×4): qty 1

## 2012-04-08 MED ORDER — TACROLIMUS 1 MG PO CAPS
5.0000 mg | ORAL_CAPSULE | Freq: Two times a day (BID) | ORAL | Status: DC
  Administered 2012-04-08 – 2012-04-11 (×6): 5 mg via ORAL
  Filled 2012-04-08 (×10): qty 5

## 2012-04-08 MED ORDER — HYDROMORPHONE HCL PF 1 MG/ML IJ SOLN: 0.5000 mg | INTRAMUSCULAR | Status: DC | PRN

## 2012-04-08 MED ORDER — INSULIN GLARGINE 100 UNIT/ML ~~LOC~~ SOLN
20.0000 [IU] | Freq: Every day | SUBCUTANEOUS | Status: DC
  Administered 2012-04-08 – 2012-04-10 (×3): 20 [IU] via SUBCUTANEOUS

## 2012-04-08 MED ORDER — HYDRALAZINE HCL 25 MG PO TABS
25.0000 mg | ORAL_TABLET | Freq: Two times a day (BID) | ORAL | Status: DC
  Administered 2012-04-08 – 2012-04-11 (×7): 25 mg via ORAL
  Filled 2012-04-08 (×8): qty 1

## 2012-04-08 MED ORDER — ENOXAPARIN SODIUM 40 MG/0.4ML ~~LOC~~ SOLN
40.0000 mg | SUBCUTANEOUS | Status: DC
  Administered 2012-04-08 – 2012-04-11 (×4): 40 mg via SUBCUTANEOUS
  Filled 2012-04-08 (×4): qty 0.4

## 2012-04-08 NOTE — ED Provider Notes (Signed)
History     CSN: [id number]  Arrival date & time 04/08/12  0300   First MD Initiated Contact with Patient 04/08/12 0300      Chief Complaint  Patient presents with  . Chest Pain   HPI The patient presents from his nursing facility with concern of new chest pain.  The pain was sternal and left-sided, tight, nonradiating, without alleviating or exacerbating factors.  The pain began insidiously, awakening the patient from sleep.  The pain lasted approximately 30 minutes, resolved spontaneously prior to arrival.  On arrival the patient denies any acute changes in his condition, but notes that his generalized weakness is persistent, with fatigue. Notably, the patient has a history of multiple prior strokes, and was discharged from this facility approximately 10 days ago after an admission for urinary tract infection and weakness.  Denies any ongoing dyspnea, headache, nausea, vomiting, confusion, disorientation. Past Medical History  Diagnosis Date  . H/O kidney transplant   . Diabetes mellitus   . Cancer     hx prostate cancer  . ESRD (end stage renal disease) 2011    hemodialysis started Sept 2011, cadaveric (double) renal  transplant Mar 2012 at [facility]  . Stroke     Past Surgical History  Procedure Date  . Kidney transplant   . Prostate surgery approx. 2003    seed implant    No family history on file.  History  Substance Use Topics  . Smoking status: Former Smoker -- 15 years    Types: Cigarettes    Quit date: 01/24/1983  . Smokeless tobacco: Never Used  . Alcohol Use: No      Review of Systems  Constitutional:       Per HPI, otherwise negative  HENT:       Per HPI, otherwise negative  Eyes: Negative.   Respiratory:       Per HPI, otherwise negative  Cardiovascular:       Per HPI, otherwise negative  Gastrointestinal: Negative for vomiting.  Genitourinary: Negative.   Musculoskeletal:       Per HPI, otherwise negative  Skin: Negative.   Neurological: Negative  for syncope.    Allergies  Review of patient's allergies indicates no known allergies.  Home Medications   Current Outpatient Rx  Name Route Sig Dispense Refill  . ACETAMINOPHEN 500 MG PO TABS Oral Take 1,000 mg by mouth every 6 (six) hours as needed. For pain    . BACLOFEN 20 MG PO TABS Oral Take 20 mg by mouth 2 (two) times daily as needed. For hiccups    . CALCIUM CARBONATE ANTACID 750 MG PO CHEW Oral Chew 1 tablet by mouth 3 (three) times daily.    Marland Kitchen CIPROFLOXACIN HCL 250 MG PO TABS Oral Take 1 tablet (250 mg total) by mouth 2 (two) times daily.    Marland Kitchen CLOPIDOGREL BISULFATE 75 MG PO TABS Oral Take 75 mg by mouth daily.    Marland Kitchen DILTIAZEM HCL ER BEADS 300 MG PO CP24 Oral Take 300 mg by mouth daily.     . FUROSEMIDE 20 MG PO TABS Oral Take 10 mg by mouth daily.    Marland Kitchen GLIPIZIDE 10 MG PO TABS Oral Take 10 mg by mouth 2 (two) times daily before a meal.    . HYDRALAZINE HCL 25 MG PO TABS Oral Take 25 mg by mouth 2 (two) times daily.    . INSULIN ASPART 100 UNIT/ML [place] SOLN Subcutaneous Inject 5-10 Units into the skin 3 (three) times daily  before meals. cbg 150-300 5 units; 301-400 8 units; 401-500 10 units    . INSULIN GLARGINE 100 UNIT/ML [place] SOLN Subcutaneous Inject 20 Units into the skin at bedtime. CBG Over 200 5 units 10 mL   . LORATADINE 10 MG PO TABS Oral Take 10 mg by mouth daily as needed. allergies    . MEGESTROL ACETATE 400 MG/10ML PO SUSP Oral Take 400 mg by mouth daily as needed. For appetite    . MYCOPHENOLATE SODIUM 180 MG PO TBEC Oral Take 180 mg by mouth 2 (two) times daily.    Marland Kitchen PANTOPRAZOLE SODIUM 40 MG PO TBEC Oral Take 40 mg by mouth daily.    Marland Kitchen POLYETHYLENE GLYCOL 3350 PO PACK Oral Take 17 g by mouth daily as needed. constipation    . SODIUM BICARBONATE 650 MG PO TABS Oral Take 1,300 mg by mouth 2 (two) times daily.    Marland Kitchen TACROLIMUS 5 MG PO CAPS Oral Take 5 mg by mouth 2 (two) times daily.      BP 126/47  Pulse 118  Temp 98.4 F (36.9 C) (Oral)  Resp 19  SpO2  100%  Physical Exam  Nursing note and vitals reviewed. Constitutional: He is oriented to person, place, and time. He appears well-developed. He appears ill. No distress.       Elderly appearing male, deconditioned in appearance, in no distress  HENT:  Head: Normocephalic and atraumatic.  Eyes: Conjunctivae and EOM are normal.  Cardiovascular: Regular rhythm and intact distal pulses.  Tachycardia present.   Murmur heard. Pulmonary/Chest: Effort normal. No stridor. No respiratory distress.  Abdominal: He exhibits no distension.  Musculoskeletal: He exhibits no edema.       Patient has cushins on his lower extremities to prevent decubitus ulcer  Neurological: He is alert and oriented to person, place, and time.  Skin: Skin is warm and dry.  Psychiatric: He has a normal mood and affect.    ED Course  Procedures (including critical care time)  Labs Reviewed  CBC - Abnormal; Notable for the following:    WBC 13.7 (*)     RBC 3.70 (*)     Hemoglobin 9.7 (*)     HCT 29.2 (*)     All other components within normal limits  PROTIME-INR  COMPREHENSIVE METABOLIC PANEL  TROPONIN I  LIPASE, BLOOD  LACTIC ACID, PLASMA  CULTURE, BLOOD (ROUTINE X 2)  CULTURE, BLOOD (ROUTINE X 2)   Dg Chest 2 View  04/08/2012  *RADIOLOGY REPORT*  Clinical Data: Weakness, chest pain, shortness of breath, history diabetes, end-stage renal disease post kidney transplant, prostate cancer  CHEST - 2 VIEW  Comparison: 03/25/2012  Findings: Minimal enlargement of cardiac silhouette. Mediastinal contours normal. Right basilar infiltrate. Mild peribronchial thickening. Remaining lungs clear. No pleural effusion, pneumothorax, or acute osseous findings.  IMPRESSION: Right basilar infiltrate.   Original Report Authenticated By: Lollie Marrow, M.D.      No diagnosis found.  Cardiac afib 111, abnormal Pulse ox 99% as the cannula abnormal    Date: 04/08/2012  Rate: 114  Rhythm: atrial fibrillation  QRS Axis:  normal  Intervals: afib  ST/T Wave abnormalities: nonspecific T wave changes  Conduction Disutrbances:none  Narrative Interpretation:   Old EKG Reviewed: changes noted  ABNORMAL  MDM  This elderly male presents from his nursing facility with concerns of chest pain.  Notably, the patient was greatly discharged after an admission for weakness and urinary tract infection.  The patient has multiple medical issues,  including prior kidney transplant, atrial fibrillation, recent strokes.  Given his description of chest pain that awoke him from sleep there suspicion of ACS versus GI pathology, though infectious etiology was considered.  The patient's evaluation is notable for nutrition of a right lower lobe opacification, leukocytosis.  Given these findings the patient was admitted for evaluation and management of hospital acquired pneumonia.    Gerhard Munch, MD 04/08/12 [phone]

## 2012-04-08 NOTE — Progress Notes (Signed)
Received pt on floor @ [phone]. Pt akae, alert oriented x4. ivf accidentally pulled out while being transferred from ed stretcher to bed. IVT made aware pt renal patient and hard stick as evaluated by RNs. Noted sacral decub stage 2 w/ douderm in place. Pt placed on cardiac monitor and made comfortable.

## 2012-04-08 NOTE — Progress Notes (Signed)
Pt seen and examined, admitted this am agree with A/P as noted by Dr.K Doing better, chest pain resolved  Zannie Cove, MD [phone]

## 2012-04-08 NOTE — ED Notes (Signed)
Admitting physician at bedside

## 2012-04-08 NOTE — H&P (Addendum)
Evan [name] is an 78 y.o. male.   Patient was seen and examined on April 08, 2012 at 5:20 AM. PCP - Dr. Margaretmary Bayley. Chief Complaint: Chest pain. HPI: 78 year-old male with history of renal transplant on immunosuppressants, diabetes mellitus type 2, recent stroke, atrial fibrillation who was just recently discharged from hospital 10 days ago after being treated for UTI was brought to the ER from his nursing home after patient is complaining of right-sided chest pain which woke him up from sleep. Patient states that right-sided chest pain was more like a pressure nonradiating. Had no associated shortness of breath or diaphoresis nausea vomiting. In the ER cardiac enzymes were negative EKG initially showed A. fib with mildly elevated heart rate. Chest x-ray shows right-sided infiltrates concerning for pneumonia. Patient also states he has been having some increased productive sputum for last couple of days though he did not have any subjective feeling of fever chills. In addition he has pain in his right hip area which he states is chronic. The chest pain lasted for half an hour and is completely resolved now. Presently heart rate is controlled.  Past Medical History  Diagnosis Date  . H/O kidney transplant   . Diabetes mellitus   . Cancer     hx prostate cancer  . ESRD (end stage renal disease) 2011    hemodialysis started Sept 2011, cadaveric (double) renal  transplant Mar 2012 at [facility]  . Stroke     Past Surgical History  Procedure Date  . Kidney transplant   . Prostate surgery approx. 2003    seed implant    History reviewed. No pertinent family history. Social History:  reports that he quit smoking about 29 years ago. His smoking use included Cigarettes. He quit after 15 years of use. He has never used smokeless tobacco. He reports that he does not drink alcohol or use illicit drugs.  Allergies: No Known Allergies   (Not in a hospital admission)  Results for orders placed  during the hospital encounter of 04/08/12 (from the past 48 hour(s))  COMPREHENSIVE METABOLIC PANEL     Status: Abnormal   Collection Time   04/08/12  3:30 AM      Component Value Range Comment   Sodium 137  135 - 145 mEq/L    Potassium 4.7  3.5 - 5.1 mEq/L    Chloride 107  96 - 112 mEq/L    CO2 18 (*) 19 - 32 mEq/L    Glucose, Bld 74  70 - 99 mg/dL    BUN 41 (*) 6 - 23 mg/dL    Creatinine, Ser 4.09 (*) 0.50 - 1.35 mg/dL    Calcium 9.6  8.4 - 81.1 mg/dL    Total Protein 7.5  6.0 - 8.3 g/dL    Albumin 3.4 (*) 3.5 - 5.2 g/dL    AST 18  0 - 37 U/L    ALT 18  0 - 53 U/L    Alkaline Phosphatase 56  39 - 117 U/L    Total Bilirubin 0.2 (*) 0.3 - 1.2 mg/dL    GFR calc non Af Amer 45 (*) >90 mL/min    GFR calc Af Amer 53 (*) >90 mL/min   CBC     Status: Abnormal   Collection Time   04/08/12  3:30 AM      Component Value Range Comment   WBC 13.7 (*) 4.0 - 10.5 K/uL    RBC 3.70 (*) 4.22 - 5.81 MIL/uL  Hemoglobin 9.7 (*) 13.0 - 17.0 g/dL    HCT 09.8 (*) 11.9 - 52.0 %    MCV 78.9  78.0 - 100.0 fL    MCH 26.2  26.0 - 34.0 pg    MCHC 33.2  30.0 - 36.0 g/dL    RDW 14.7  82.9 - 56.2 %    Platelets 201  150 - 400 K/uL   PROTIME-INR     Status: Normal   Collection Time   04/08/12  3:30 AM      Component Value Range Comment   Prothrombin Time 12.8  11.6 - 15.2 seconds    INR 0.94  0.00 - 1.49   LIPASE, BLOOD     Status: Normal   Collection Time   04/08/12  3:30 AM      Component Value Range Comment   Lipase 21  11 - 59 U/L   TROPONIN I     Status: Normal   Collection Time   04/08/12  3:31 AM      Component Value Range Comment   Troponin I <0.30  <0.30 ng/mL   LACTIC ACID, PLASMA     Status: Normal   Collection Time   04/08/12  3:32 AM      Component Value Range Comment   Lactic Acid, Venous 0.8  0.5 - 2.2 mmol/L    Dg Chest 2 View  04/08/2012  *RADIOLOGY REPORT*  Clinical Data: Weakness, chest pain, shortness of breath, history diabetes, end-stage renal disease post kidney transplant,  prostate cancer  CHEST - 2 VIEW  Comparison: 03/25/2012  Findings: Minimal enlargement of cardiac silhouette. Mediastinal contours normal. Right basilar infiltrate. Mild peribronchial thickening. Remaining lungs clear. No pleural effusion, pneumothorax, or acute osseous findings.  IMPRESSION: Right basilar infiltrate.   Original Report Authenticated By: Lollie Marrow, M.D.     Review of Systems  Constitutional: Negative.   HENT: Negative.   Eyes: Negative.   Respiratory: Positive for sputum production.   Cardiovascular: Positive for chest pain.  Gastrointestinal: Negative.   Genitourinary: Negative.   Musculoskeletal: Positive for joint pain.  Skin: Negative.   Neurological: Negative.   Endo/Heme/Allergies: Negative.   Psychiatric/Behavioral: Negative.     Blood pressure 130/81, pulse 85, temperature 98.4 F (36.9 C), temperature source Oral, resp. rate 20, SpO2 100.00%. Physical Exam  Constitutional: He is oriented to person, place, and time. He appears well-developed and well-nourished. No distress.  HENT:  Head: Normocephalic and atraumatic.  Right Ear: External ear normal.  Left Ear: External ear normal.  Nose: Nose normal.  Mouth/Throat: Oropharynx is clear and moist. No oropharyngeal exudate.  Eyes: Conjunctivae are normal. Pupils are equal, round, and reactive to light. Right eye exhibits no discharge. Left eye exhibits no discharge. No scleral icterus.  Neck: Normal range of motion. Neck supple.  Cardiovascular: Normal rate.        Irregular.  Respiratory: Effort normal and breath sounds normal. No respiratory distress. He has no wheezes. He has no rales.  GI: Soft. Bowel sounds are normal. He exhibits no distension. There is no tenderness. There is no rebound.  Musculoskeletal: Normal range of motion. He exhibits no edema and no tenderness.  Neurological: He is alert and oriented to person, place, and time.       Moves all extremities.  Skin: He is not diaphoretic.      Assessment/Plan #1. Health care associated pneumonia - continue with vancomycin cefepime and Levaquin. #2. Atrial fibrillation presently rate controlled - continue present medications. Patient  is not on any anticoagulants, not sure the exact reason. #3. Renal transplant creatinine at baseline - continue monitoring metabolic panel and continue immunosuppressants. #4. History of aortic stenosis and diastolic CHF - presently not short of breath. #5. Diabetes mellitus2 - continue home medications. #6. Recent stroke - continue antiplatelet agents. #7. Chronic anemia probably from renal disease - follow CBC.  CODE STATUS - full code.   Katalyna Socarras N. 04/08/2012, 5:53 AM

## 2012-04-08 NOTE — ED Notes (Signed)
Per EMS, pt from [facility] c/o left chest pressure that lasted approx 30 minutes. Given 324 ASA at nursing home. Denying chest pain currently. Hx of Afib, kidney transplant 2010, diabetes.

## 2012-04-08 NOTE — ED Notes (Signed)
Pt. To x-ray .

## 2012-04-08 NOTE — ED Notes (Signed)
Wife, Loya: [phone]. Daughter, Tacey Ruiz: [phone]

## 2012-04-08 NOTE — Progress Notes (Signed)
ANTIBIOTIC CONSULT NOTE - INITIAL  Pharmacy Consult for Vancocin/Maxipime/Levaquin Indication: rule out pneumonia  No Known Allergies  Patient Measurements: Height: 5\' 11"  (180.3 cm) Weight: 172 lb 6.4 oz (78.2 kg) IBW/kg (Calculated) : 75.3   Vital Signs: Temp: 98.7 F (37.1 C) (09/03 0631) Temp src: Oral (09/03 0631) BP: 131/55 mmHg (09/03 0631) Pulse Rate: 89  (09/03 0631)  Labs:  Basename 04/08/12 0330  WBC 13.7*  HGB 9.7*  PLT 201  LABCREA --  CREATININE 1.43*   Estimated Creatinine Clearance: 45.3 ml/min (by C-G formula based on Cr of 1.43).  Microbiology: Recent Results (from the past 720 hour(s))  URINE CULTURE     Status: Normal   Collection Time   03/25/12  3:52 PM      Component Value Range Status Comment   Specimen Description URINE, RANDOM   Final    Special Requests NONE   Final    Culture  Setup Time 03/25/2012 22:05   Final    Colony Count 9,000 COLONIES/ML   Final    Culture INSIGNIFICANT GROWTH   Final    Report Status 03/26/2012 FINAL   Final     Medical History: Past Medical History  Diagnosis Date  . H/O kidney transplant   . Diabetes mellitus   . Cancer     hx prostate cancer  . ESRD (end stage renal disease) 2011    hemodialysis started Sept 2011, cadaveric (double) renal  transplant Mar 2012 at [facility]  . Stroke     Medications:  Prescriptions prior to admission  Medication Sig Dispense Refill  . acetaminophen (TYLENOL) 500 MG tablet Take 1,000 mg by mouth every 6 (six) hours as needed. For pain      . baclofen (LIORESAL) 20 MG tablet Take 20 mg by mouth 2 (two) times daily as needed. For hiccups      . calcium carbonate (TUMS EX) 750 MG chewable tablet Chew 1 tablet by mouth 3 (three) times daily.      . ciprofloxacin (CIPRO) 250 MG tablet Take 1 tablet (250 mg total) by mouth 2 (two) times daily.      . clopidogrel (PLAVIX) 75 MG tablet Take 75 mg by mouth daily.      Marland Kitchen diltiazem (TIAZAC) 300 MG 24 hr capsule Take 300 mg by mouth  daily.       . furosemide (LASIX) 20 MG tablet Take 10 mg by mouth daily.      Marland Kitchen glipiZIDE (GLUCOTROL) 10 MG tablet Take 10 mg by mouth 2 (two) times daily before a meal.      . hydrALAZINE (APRESOLINE) 25 MG tablet Take 25 mg by mouth 2 (two) times daily.      . insulin aspart (NOVOLOG) 100 UNIT/ML injection Inject 5-10 Units into the skin 3 (three) times daily before meals. cbg 150-300 5 units; 301-400 8 units; 401-500 10 units      . insulin glargine (LANTUS) 100 UNIT/ML injection Inject 20 Units into the skin at bedtime. CBG Over 200 5 units  10 mL    . loratadine (CLARITIN) 10 MG tablet Take 10 mg by mouth daily as needed. allergies      . megestrol (MEGACE) 400 MG/10ML suspension Take 400 mg by mouth daily as needed. For appetite      . mycophenolate (MYFORTIC) 180 MG EC tablet Take 180 mg by mouth 2 (two) times daily.      . pantoprazole (PROTONIX) 40 MG tablet Take 40 mg by mouth daily.      Marland Kitchen  polyethylene glycol (MIRALAX / GLYCOLAX) packet Take 17 g by mouth daily as needed. constipation      . sodium bicarbonate 650 MG tablet Take 1,300 mg by mouth 2 (two) times daily.      . tacrolimus (PROGRAF) 5 MG capsule Take 5 mg by mouth 2 (two) times daily.       Scheduled:    . sodium chloride   Intravenous STAT  . acetaminophen  650 mg Oral Once  . calcium carbonate  1 tablet Oral TID  . ceFEPime (MAXIPIME) IV  1 g Intravenous Q24H  . clopidogrel  75 mg Oral Daily  . diltiazem  300 mg Oral Daily  . enoxaparin (LOVENOX) injection  40 mg Subcutaneous Q24H  . furosemide  10 mg Oral Daily  . glipiZIDE  10 mg Oral BID AC  . hydrALAZINE  25 mg Oral BID  . insulin aspart  0-9 Units Subcutaneous TID WC  . insulin glargine  20 Units Subcutaneous QHS  . levofloxacin (LEVAQUIN) IV  750 mg Intravenous Q48H  . megestrol  400 mg Oral Daily  . mycophenolate  180 mg Oral BID  . pantoprazole  40 mg Oral Daily  . piperacillin-tazobactam (ZOSYN)  IV  3.375 g Intravenous Once  . sodium bicarbonate   1,300 mg Oral BID  . sodium chloride  3 mL Intravenous Q12H  . sodium chloride  3 mL Intravenous Q12H  . tacrolimus  5 mg Oral BID  . vancomycin  750 mg Intravenous Q12H  . vancomycin  1,000 mg Intravenous Once    Assessment: 78yo male was discharged from hospital 10 days ago after being tx'd for UTI, now presents from NH with CP that woke him from sleep, denies SOB, CXR shows infiltrates concerning for PNA, to begin IV ABX.  Goal of Therapy:  Vancomycin trough level 15-20 mcg/ml  Plan:  Rec'd vanc 1g in ED; will continue with vancomycin 750mg  IV Q12H, cefepime 1g IV Q24H, and Levaquin 750mg  IV Q48H and monitor CBC, Cx, levels prn.  Colleen Can PharmD BCPS 04/08/2012,7:22 AM

## 2012-04-08 NOTE — ED Notes (Signed)
Pt. C/o of left chest pressure that last approx 30 minutes, states SOB with chest pain. Denies chest pain currently. Given 324 at nursing home. Radial pulses strong.

## 2012-04-09 DIAGNOSIS — I359 Nonrheumatic aortic valve disorder, unspecified: Secondary | ICD-10-CM

## 2012-04-09 LAB — CBC
Hemoglobin: 9.7 g/dL — ABNORMAL LOW (ref 13.0–17.0)
MCH: 26.6 pg (ref 26.0–34.0)
RBC: 3.64 MIL/uL — ABNORMAL LOW (ref 4.22–5.81)
WBC: 8 10*3/uL (ref 4.0–10.5)

## 2012-04-09 LAB — BASIC METABOLIC PANEL
CO2: 17 mEq/L — ABNORMAL LOW (ref 19–32)
Calcium: 9.3 mg/dL (ref 8.4–10.5)
Chloride: 108 mEq/L (ref 96–112)
Glucose, Bld: 56 mg/dL — ABNORMAL LOW (ref 70–99)
Sodium: 138 mEq/L (ref 135–145)

## 2012-04-09 LAB — GLUCOSE, CAPILLARY
Glucose-Capillary: 68 mg/dL — ABNORMAL LOW (ref 70–99)
Glucose-Capillary: 109 mg/dL — ABNORMAL HIGH (ref 70–99)

## 2012-04-09 MED ORDER — LEVOFLOXACIN 750 MG PO TABS
750.0000 mg | ORAL_TABLET | ORAL | Status: DC
  Administered 2012-04-10: 750 mg via ORAL
  Filled 2012-04-09: qty 1

## 2012-04-09 NOTE — Progress Notes (Signed)
[facility] PROGRESS NOTE  Evan [name] RUE:[id number] DOB: [birth year] DOA: 04/08/2012 PCP: Laurena Slimmer, MD  Assessment/Plan: Healthcare associated pneumonia -Clinically improving, WBC decreasing Atrial fibrillation -Rate controlled on diltiazem Renal transplant with CKD -Renal function is stable Chronic diastolic CHF -Clinically stable Anemia of CKD Recent stroke -Continue Plavix Diabetes mellitus type 2 -Hemoglobin A1c 5.8 on 01/25/2012 -Discontinue glipizide -Continue Lantus for now  Procedures/Studies: Dg Chest 1 View  03/25/2012  *RADIOLOGY REPORT*  Clinical Data: 78 year old male with weakness and history of stroke.  CHEST - 1 VIEW  Comparison: 02/14/2012 and earlier.  Findings: AP view at 1457 hours.  Streaky retrocardiac opacity. Somewhat lower lung volumes overall.  Stable right lung base.  No pneumothorax, pulmonary edema or pleural effusion.  Cardiac size and mediastinal contours are within normal limits.  Visualized tracheal air column is within normal limits.  IMPRESSION: Lower lung volumes.  Streaky retrocardiac opacity could reflect atelectasis or infection/aspiration.   Original Report Authenticated By: Harley Hallmark, M.D.    Dg Chest 2 View  04/08/2012  *RADIOLOGY REPORT*  Clinical Data: Weakness, chest pain, shortness of breath, history diabetes, end-stage renal disease post kidney transplant, prostate cancer  CHEST - 2 VIEW  Comparison: 03/25/2012  Findings: Minimal enlargement of cardiac silhouette. Mediastinal contours normal. Right basilar infiltrate. Mild peribronchial thickening. Remaining lungs clear. No pleural effusion, pneumothorax, or acute osseous findings.  IMPRESSION: Right basilar infiltrate.   Original Report Authenticated By: Lollie Marrow, M.D.    Ct Head Wo Contrast  03/25/2012  *RADIOLOGY REPORT*  Clinical Data: Fall, increased weakness  CT HEAD WITHOUT CONTRAST  Technique:  Contiguous axial images were obtained from the base of the skull  through the vertex without contrast.  Comparison: 03/18/2012  Findings: Interval decrease in size of the left frontal scalp hematoma.  No underlying fracture.  Mild brain atrophy and chronic microvascular white matter ischemic changes.  No acute intracranial hemorrhage, mass lesion, definite infarction, midline shift, herniation, hydrocephalus, or extra-axial fluid collection.  Gray- white matter differentiation maintained.  Cisterns patent.  No cerebellar abnormality.  Orbits are symmetric.  Mastoids and sinuses remain clear.  No depressed skull fracture.  IMPRESSION: Resolving left frontal scalp hematoma.  No acute intracranial finding.  Stable atrophy.   Original Report Authenticated By: Judie Petit. Ruel Favors, M.D.    Ct Head Wo Contrast  03/18/2012  *RADIOLOGY REPORT*  Clinical Data: Fall, head laceration  CT HEAD WITHOUT CONTRAST,CT CERVICAL SPINE WITHOUT CONTRAST  Technique:  Contiguous axial images were obtained from the base of the skull through the vertex without contrast.,Technique: Multidetector CT imaging of the cervical spine was performed. Multiplanar CT image reconstructions were also generated.  Comparison: 01/24/2012  Findings: No skull fracture is noted.  There is a skin irregularity and soft tissue swelling left frontal scalp.  Subcutaneous hematoma in the frontal region measures 1.2 x 2.5 cm.  No intracranial hemorrhage, mass effect or midline shift.  No acute infarction.  No mass lesion is noted on this unenhanced scan.  Small lacunar infarct is noted in the left pons.  IMPRESSION: No acute intracranial abnormality.  There is soft tissue swelling subcutaneous stranding and subcutaneous hematoma in the left frontal region.  Hematoma measures 2.5 x 1.2 cm.  CT cervical spine without IV contrast:  Axial images of the cervical spine shows no acute fracture or subluxation.  There is disc space flattening with disc calcifications at C5-C6 level.  Mild anterior and mild posterior spurring at C5-C6 level.   Anterior spurring  noted lower endplate of the C4 vertebral body.  Mild disc space flattening with mild anterior spurring and mild posterior spurring at C6-C7 level.  Mild anterior spurring lower endplate of the C7 vertebral body.  No prevertebral soft tissue swelling.  Cervical airway is patent.  There is no pneumothorax in visualized lung apices.  Impression: 1.  No acute fracture or subluxation.  Degenerative changes as described above.  Original Report Authenticated By: Natasha Mead, M.D.   Ct Cervical Spine Wo Contrast  03/18/2012  *RADIOLOGY REPORT*  Clinical Data: Fall, head laceration  CT HEAD WITHOUT CONTRAST,CT CERVICAL SPINE WITHOUT CONTRAST  Technique:  Contiguous axial images were obtained from the base of the skull through the vertex without contrast.,Technique: Multidetector CT imaging of the cervical spine was performed. Multiplanar CT image reconstructions were also generated.  Comparison: 01/24/2012  Findings: No skull fracture is noted.  There is a skin irregularity and soft tissue swelling left frontal scalp.  Subcutaneous hematoma in the frontal region measures 1.2 x 2.5 cm.  No intracranial hemorrhage, mass effect or midline shift.  No acute infarction.  No mass lesion is noted on this unenhanced scan.  Small lacunar infarct is noted in the left pons.  IMPRESSION: No acute intracranial abnormality.  There is soft tissue swelling subcutaneous stranding and subcutaneous hematoma in the left frontal region.  Hematoma measures 2.5 x 1.2 cm.  CT cervical spine without IV contrast:  Axial images of the cervical spine shows no acute fracture or subluxation.  There is disc space flattening with disc calcifications at C5-C6 level.  Mild anterior and mild posterior spurring at C5-C6 level.  Anterior spurring noted lower endplate of the C4 vertebral body.  Mild disc space flattening with mild anterior spurring and mild posterior spurring at C6-C7 level.  Mild anterior spurring lower endplate of the C7  vertebral body.  No prevertebral soft tissue swelling.  Cervical airway is patent.  There is no pneumothorax in visualized lung apices.  Impression: 1.  No acute fracture or subluxation.  Degenerative changes as described above.  Original Report Authenticated By: Natasha Mead, M.D.   Mr Brain Wo Contrast  03/27/2012  *RADIOLOGY REPORT*  Clinical Data: Right-sided weakness.  Fall 1 week ago.  History of pontine infarct 01/2012.  Resulting of the right hemiplegia and dysarthria.  Renal failure.  Prostate cancer.  MRI HEAD WITHOUT CONTRAST  Technique:  Multiplanar, multiecho pulse sequences of the brain and surrounding structures were obtained according to standard protocol without intravenous contrast.  Comparison: 03/25/2012 CT.  01/24/2012 MR.  Findings: No acute infarct.  Remote bilateral pontine infarcts most recent involving the left pons.  Minimal interval improvement of altered signal intensity previously noted involving the occipital lobes.  Mild small vessel disease type changes.  Left frontal subcutaneous hematoma.  No intracranial hemorrhage.  Global atrophy without hydrocephalus.  Altered signal intensity of bone marrow may represent result of prostate metastatic disease versus result of anemia from renal failure.  Partial opacification right and mastoid air cells.  No obstructing lesion noted in the posterior-superior nasopharynx causing eustachian tube dysfunction.  Minimal ethmoid sinus air cells and maxillary sinus mucosal thickening.  Major intracranial vascular structures are patent with small vertebral arteries and basilar artery.  Exophthalmos.  IMPRESSION:  No acute infarct.  Remote bilateral pontine infarcts most recent involving the left pons.  Left frontal subcutaneous hematoma.  No intracranial hemorrhage.  Global atrophy without hydrocephalus.  Altered signal intensity of bone marrow may represent result of prostate metastatic  disease versus result of anemia from renal failure.  Please see  above.   Original Report Authenticated By: Fuller Canada, M.D.       Antibiotics:  Vancomycin September 3>>  Cefepime September 3>>>  Levaquin September 3>>>   Code Status: Full Family Communication: Discussed with wife Disposition Plan: Home when medically stable  Subjective: Patient is feeling better overall. His cough is improving as well as his shortness of breath. His chest pain has resolved. He denies any nausea, vomiting, diarrhea, abdominal pain, headaches, visual changes, rashes  Objective: Filed Vitals:   04/09/12 0518 04/09/12 0923 04/09/12 1430 04/09/12 1710  BP: 146/53 148/72 129/67 137/68  Pulse: 73 77 78 73  Temp: 98.4 F (36.9 C) 98.3 F (36.8 C) 97.6 F (36.4 C)   TempSrc: Oral Oral Oral   Resp: 18 18 20 18   Height:      Weight: 81.149 kg (178 lb 14.4 oz)     SpO2: 100% 100% 100% 100%    Intake/Output Summary (Last 24 hours) at 04/09/12 1956 Last data filed at 04/09/12 1823  Gross per 24 hour  Intake   1796 ml  Output   1975 ml  Net   -179 ml   Weight change: 2.948 kg (6 lb 8 oz) Exam:   General:  Pt is alert, follows commands appropriately, not in acute distress  HEENT: No icterus, No thrush, No neck mass, [place]/AT  Cardiovascular: Regular rate and rhythm, S1/S2, no rubs, no gallops  Respiratory: Bibasilar crackles, left clear to auscultation no wheezing, no crackles, no rhonchi  Abdomen: Soft, non tender, non distended, bowel sounds present, no guarding  Extremities: Trace edema, No lymphangitis, No petechiae, No rashes, no synovitis  Data Reviewed: Basic Metabolic Panel:  Lab 04/09/12 4098 04/08/12 0756 04/08/12 0330  NA 138 140 137  K 4.7 4.7 4.7  CL 108 109 107  CO2 17* 18* 18*  GLUCOSE 56* 68* 74  BUN 40* 40* 41*  CREATININE 1.61* 1.43* 1.43*  CALCIUM 9.3 9.6 9.6  MG -- -- --  PHOS -- -- --   Liver Function Tests:  Lab 04/08/12 0756 04/08/12 0330  AST 16 18  ALT 15 18  ALKPHOS 54 56  BILITOT 0.3 0.2*  PROT 7.3 7.5   ALBUMIN 3.2* 3.4*    Lab 04/08/12 0330  LIPASE 21  AMYLASE --   No results found for this basename: AMMONIA:5 in the last 168 hours CBC:  Lab 04/09/12 0500 04/08/12 0756 04/08/12 0330  WBC 8.0 10.5 13.7*  NEUTROABS -- 8.7* --  HGB 9.7* 10.0* 9.7*  HCT 28.4* 29.3* 29.2*  MCV 78.0 77.9* 78.9  PLT 195 197 201   Cardiac Enzymes:  Lab 04/08/12 0750 04/08/12 0331  CKTOTAL -- --  CKMB -- --  CKMBINDEX -- --  TROPONINI <0.30 <0.30   BNP: No components found with this basename: POCBNP:5 CBG:  Lab 04/09/12 1610 04/09/12 1109 04/09/12 0601 04/08/12 2103 04/08/12 1706  GLUCAP 81 109* 68* 130* 149*    Recent Results (from the past 240 hour(s))  CULTURE, BLOOD (ROUTINE X 2)     Status: Normal (Preliminary result)   Collection Time   04/08/12  4:48 AM      Component Value Range Status Comment   Specimen Description BLOOD RIGHT ARM   Final    Special Requests BOTTLES DRAWN AEROBIC ONLY 5CC   Final    Culture  Setup Time 04/08/2012 10:19   Final    Culture  Final    Value:        BLOOD CULTURE RECEIVED NO GROWTH TO DATE CULTURE WILL BE HELD FOR 5 DAYS BEFORE ISSUING A FINAL NEGATIVE REPORT   Report Status PENDING   Incomplete   CULTURE, BLOOD (ROUTINE X 2)     Status: Normal (Preliminary result)   Collection Time   04/08/12  4:55 AM      Component Value Range Status Comment   Specimen Description BLOOD RIGHT HAND   Final    Special Requests BOTTLES DRAWN AEROBIC ONLY 5CC   Final    Culture  Setup Time 04/08/2012 10:19   Final    Culture     Final    Value:        BLOOD CULTURE RECEIVED NO GROWTH TO DATE CULTURE WILL BE HELD FOR 5 DAYS BEFORE ISSUING A FINAL NEGATIVE REPORT   Report Status PENDING   Incomplete      Scheduled Meds:   . calcium carbonate  1 tablet Oral TID  . ceFEPime (MAXIPIME) IV  1 g Intravenous Q24H  . clopidogrel  75 mg Oral Daily  . diltiazem  300 mg Oral Daily  . enoxaparin (LOVENOX) injection  40 mg Subcutaneous Q24H  . furosemide  10 mg Oral  Daily  . glipiZIDE  10 mg Oral BID AC  . hydrALAZINE  25 mg Oral BID  . insulin aspart  0-9 Units Subcutaneous TID WC  . insulin glargine  20 Units Subcutaneous QHS  . levofloxacin (LEVAQUIN) IV  750 mg Intravenous Q48H  . megestrol  400 mg Oral Daily  . mycophenolate  180 mg Oral BID  . pantoprazole  40 mg Oral Daily  . sodium bicarbonate  1,300 mg Oral BID  . sodium chloride  3 mL Intravenous Q12H  . tacrolimus  5 mg Oral BID WC  . vancomycin  750 mg Intravenous Q12H   Continuous Infusions:    Gennavieve Huq, DO  [facility] Pager [phone]  If 7PM-7AM, please contact night-coverage [email or web address] Password [email or web address] 04/09/2012, 7:56 PM   LOS: 1 day

## 2012-04-09 NOTE — Progress Notes (Signed)
Inpatient Diabetes Program Recommendations  AACE/ADA: New Consensus Statement on Inpatient Glycemic Control (2013)  Target Ranges:  Prepandial:   less than 140 mg/dL      Peak postprandial:   less than 180 mg/dL (1-2 hours)      Critically ill patients:  140 - 180 mg/dL   Results for JASPAL, PULTZ (MRN [id number]) as of 04/09/2012 10:45  Ref. Range 04/09/2012 05:00  Glucose Latest Range: 70-99 mg/dL 56 (L)    Inpatient Diabetes Program Recommendations Insulin - Basal: If patient continues to have hypoglycemia in the morning, please decrease Lantus to 17 units QHS.  Note: Will follow. Ambrose Finland RN, MSN, CDE Diabetes Coordinator Inpatient Diabetes Program [phone]

## 2012-04-10 LAB — CBC
HCT: 25.8 % — ABNORMAL LOW (ref 39.0–52.0)
Hemoglobin: 8.8 g/dL — ABNORMAL LOW (ref 13.0–17.0)
MCH: 26.4 pg (ref 26.0–34.0)
MCHC: 34.1 g/dL (ref 30.0–36.0)
MCV: 77.5 fL — ABNORMAL LOW (ref 78.0–100.0)
RDW: 14.3 % (ref 11.5–15.5)

## 2012-04-10 LAB — BASIC METABOLIC PANEL
BUN: 39 mg/dL — ABNORMAL HIGH (ref 6–23)
Creatinine, Ser: 1.43 mg/dL — ABNORMAL HIGH (ref 0.50–1.35)
GFR calc Af Amer: 53 mL/min — ABNORMAL LOW (ref >90)
GFR calc non Af Amer: 45 mL/min — ABNORMAL LOW (ref >90)
Glucose, Bld: 62 mg/dL — ABNORMAL LOW (ref 70–99)

## 2012-04-10 LAB — GLUCOSE, CAPILLARY: Glucose-Capillary: 219 mg/dL — ABNORMAL HIGH (ref 70–99)

## 2012-04-10 NOTE — Progress Notes (Signed)
Pt with 13 beats v. Tach. Pt asymptomatic, resting, and VS stable. MD made aware. No new orders at this time. Will continue to monitor.

## 2012-04-10 NOTE — Progress Notes (Signed)
[facility] PROGRESS NOTE  Evan [name]:[id number] DOB: 03/13/1932 DOA: 04/08/2012 PCP: Laurena Slimmer, MD  Assessment/Plan: Healthcare associated pneumonia  -Clinically improving, WBC decreasing  -Discontinue vancomycin -Continue cefepime and Levaquin for now Atrial fibrillation  -Rate controlled on diltiazem  Renal transplant with CKD  -Renal function is stable  Chronic diastolic CHF  -Clinically stable  Anemia of CKD  Recent stroke  -Left pontine infarct on 01/24/2012 -Continue Plavix  Diabetes mellitus type 2  -Hemoglobin A1c 5.8 on 01/25/2012  -Discontinue glipizide  -Continue Lantus for now   Procedures/Studies: Dg Chest 1 View  03/25/2012  *RADIOLOGY REPORT*  Clinical Data: 78 year old male with weakness and history of stroke.  CHEST - 1 VIEW  Comparison: 02/14/2012 and earlier.  Findings: AP view at 1457 hours.  Streaky retrocardiac opacity. Somewhat lower lung volumes overall.  Stable right lung base.  No pneumothorax, pulmonary edema or pleural effusion.  Cardiac size and mediastinal contours are within normal limits.  Visualized tracheal air column is within normal limits.  IMPRESSION: Lower lung volumes.  Streaky retrocardiac opacity could reflect atelectasis or infection/aspiration.   Original Report Authenticated By: Harley Hallmark, M.D.    Dg Chest 2 View  04/08/2012  *RADIOLOGY REPORT*  Clinical Data: Weakness, chest pain, shortness of breath, history diabetes, end-stage renal disease post kidney transplant, prostate cancer  CHEST - 2 VIEW  Comparison: 03/25/2012  Findings: Minimal enlargement of cardiac silhouette. Mediastinal contours normal. Right basilar infiltrate. Mild peribronchial thickening. Remaining lungs clear. No pleural effusion, pneumothorax, or acute osseous findings.  IMPRESSION: Right basilar infiltrate.   Original Report Authenticated By: Lollie Marrow, M.D.    Ct Head Wo Contrast  03/25/2012  *RADIOLOGY REPORT*  Clinical Data: Fall,  increased weakness  CT HEAD WITHOUT CONTRAST  Technique:  Contiguous axial images were obtained from the base of the skull through the vertex without contrast.  Comparison: 03/18/2012  Findings: Interval decrease in size of the left frontal scalp hematoma.  No underlying fracture.  Mild brain atrophy and chronic microvascular white matter ischemic changes.  No acute intracranial hemorrhage, mass lesion, definite infarction, midline shift, herniation, hydrocephalus, or extra-axial fluid collection.  Gray- white matter differentiation maintained.  Cisterns patent.  No cerebellar abnormality.  Orbits are symmetric.  Mastoids and sinuses remain clear.  No depressed skull fracture.  IMPRESSION: Resolving left frontal scalp hematoma.  No acute intracranial finding.  Stable atrophy.   Original Report Authenticated By: Judie Petit. Ruel Favors, M.D.    Ct Head Wo Contrast  03/18/2012  *RADIOLOGY REPORT*  Clinical Data: Fall, head laceration  CT HEAD WITHOUT CONTRAST,CT CERVICAL SPINE WITHOUT CONTRAST  Technique:  Contiguous axial images were obtained from the base of the skull through the vertex without contrast.,Technique: Multidetector CT imaging of the cervical spine was performed. Multiplanar CT image reconstructions were also generated.  Comparison: 01/24/2012  Findings: No skull fracture is noted.  There is a skin irregularity and soft tissue swelling left frontal scalp.  Subcutaneous hematoma in the frontal region measures 1.2 x 2.5 cm.  No intracranial hemorrhage, mass effect or midline shift.  No acute infarction.  No mass lesion is noted on this unenhanced scan.  Small lacunar infarct is noted in the left pons.  IMPRESSION: No acute intracranial abnormality.  There is soft tissue swelling subcutaneous stranding and subcutaneous hematoma in the left frontal region.  Hematoma measures 2.5 x 1.2 cm.  CT cervical spine without IV contrast:  Axial images of the cervical spine shows no acute fracture  or subluxation.  There is  disc space flattening with disc calcifications at C5-C6 level.  Mild anterior and mild posterior spurring at C5-C6 level.  Anterior spurring noted lower endplate of the C4 vertebral body.  Mild disc space flattening with mild anterior spurring and mild posterior spurring at C6-C7 level.  Mild anterior spurring lower endplate of the C7 vertebral body.  No prevertebral soft tissue swelling.  Cervical airway is patent.  There is no pneumothorax in visualized lung apices.  Impression: 1.  No acute fracture or subluxation.  Degenerative changes as described above.  Original Report Authenticated By: Natasha Mead, M.D.   Ct Cervical Spine Wo Contrast  03/18/2012  *RADIOLOGY REPORT*  Clinical Data: Fall, head laceration  CT HEAD WITHOUT CONTRAST,CT CERVICAL SPINE WITHOUT CONTRAST  Technique:  Contiguous axial images were obtained from the base of the skull through the vertex without contrast.,Technique: Multidetector CT imaging of the cervical spine was performed. Multiplanar CT image reconstructions were also generated.  Comparison: 01/24/2012  Findings: No skull fracture is noted.  There is a skin irregularity and soft tissue swelling left frontal scalp.  Subcutaneous hematoma in the frontal region measures 1.2 x 2.5 cm.  No intracranial hemorrhage, mass effect or midline shift.  No acute infarction.  No mass lesion is noted on this unenhanced scan.  Small lacunar infarct is noted in the left pons.  IMPRESSION: No acute intracranial abnormality.  There is soft tissue swelling subcutaneous stranding and subcutaneous hematoma in the left frontal region.  Hematoma measures 2.5 x 1.2 cm.  CT cervical spine without IV contrast:  Axial images of the cervical spine shows no acute fracture or subluxation.  There is disc space flattening with disc calcifications at C5-C6 level.  Mild anterior and mild posterior spurring at C5-C6 level.  Anterior spurring noted lower endplate of the C4 vertebral body.  Mild disc space flattening  with mild anterior spurring and mild posterior spurring at C6-C7 level.  Mild anterior spurring lower endplate of the C7 vertebral body.  No prevertebral soft tissue swelling.  Cervical airway is patent.  There is no pneumothorax in visualized lung apices.  Impression: 1.  No acute fracture or subluxation.  Degenerative changes as described above.  Original Report Authenticated By: Natasha Mead, M.D.   Mr Brain Wo Contrast  03/27/2012  *RADIOLOGY REPORT*  Clinical Data: Right-sided weakness.  Fall 1 week ago.  History of pontine infarct 01/2012.  Resulting of the right hemiplegia and dysarthria.  Renal failure.  Prostate cancer.  MRI HEAD WITHOUT CONTRAST  Technique:  Multiplanar, multiecho pulse sequences of the brain and surrounding structures were obtained according to standard protocol without intravenous contrast.  Comparison: 03/25/2012 CT.  01/24/2012 MR.  Findings: No acute infarct.  Remote bilateral pontine infarcts most recent involving the left pons.  Minimal interval improvement of altered signal intensity previously noted involving the occipital lobes.  Mild small vessel disease type changes.  Left frontal subcutaneous hematoma.  No intracranial hemorrhage.  Global atrophy without hydrocephalus.  Altered signal intensity of bone marrow may represent result of prostate metastatic disease versus result of anemia from renal failure.  Partial opacification right and mastoid air cells.  No obstructing lesion noted in the posterior-superior nasopharynx causing eustachian tube dysfunction.  Minimal ethmoid sinus air cells and maxillary sinus mucosal thickening.  Major intracranial vascular structures are patent with small vertebral arteries and basilar artery.  Exophthalmos.  IMPRESSION:  No acute infarct.  Remote bilateral pontine infarcts most recent involving the left  pons.  Left frontal subcutaneous hematoma.  No intracranial hemorrhage.  Global atrophy without hydrocephalus.  Altered signal intensity of  bone marrow may represent result of prostate metastatic disease versus result of anemia from renal failure.  Please see above.   Original Report Authenticated By: Fuller Canada, M.D.     Antibiotics:  Vancomycin September 3>> 9/5 Cefepime September 3>>>  Levaquin September 3>>>   Code Status: Full Family Communication: Discussed with wife at the bedside Disposition Plan: SNF when medically stable  Subjective: Patient feels well. He denies any chest pain, shortness of breath, fevers, chills, nausea, vomiting, diarrhea. His cough is much improved. There is no further sputum production or hemoptysis. He denies any abdominal pain or dysuria.  Objective: Filed Vitals:   04/09/12 2212 04/10/12 0505 04/10/12 1036 04/10/12 1343  BP: 157/51 159/70 140/50 118/71  Pulse: 70 65 78 80  Temp: 98.6 F (37 C) 98 F (36.7 C)  98.3 F (36.8 C)  TempSrc: Oral Oral  Oral  Resp: 16 18 18 19   Height:      Weight:  80.6 kg (177 lb 11.1 oz)    SpO2: 100% 100% 99% 100%    Intake/Output Summary (Last 24 hours) at 04/10/12 1932 Last data filed at 04/10/12 1821  Gross per 24 hour  Intake    843 ml  Output   2675 ml  Net  -1832 ml   Weight change: -0.549 kg (-1 lb 3.4 oz) Exam:   General:  Pt is alert, follows commands appropriately, not in acute distress  HEENT: No icterus, No thrush, No neck mass, [place]/AT  Cardiovascular: Regular rate and rhythm, S1/S2, no rubs, no gallops  Respiratory: Right basilar crackles, no wheezing, left side clear to auscultation, no rhonchi  Abdomen: Soft, non tender, non distended, bowel sounds present, no guarding  Extremities: Trace edema, No lymphangitis, No petechiae, No rashes, no synovitis  Data Reviewed: Basic Metabolic Panel:  Lab 04/10/12 1610 04/09/12 0500 04/08/12 0756 04/08/12 0330  NA 138 138 140 137  K 4.2 4.7 4.7 4.7  CL 109 108 109 107  CO2 18* 17* 18* 18*  GLUCOSE 62* 56* 68* 74  BUN 39* 40* 40* 41*  CREATININE 1.43* 1.61* 1.43* 1.43*    CALCIUM 9.1 9.3 9.6 9.6  MG -- -- -- --  PHOS -- -- -- --   Liver Function Tests:  Lab 04/08/12 0756 04/08/12 0330  AST 16 18  ALT 15 18  ALKPHOS 54 56  BILITOT 0.3 0.2*  PROT 7.3 7.5  ALBUMIN 3.2* 3.4*    Lab 04/08/12 0330  LIPASE 21  AMYLASE --   No results found for this basename: AMMONIA:5 in the last 168 hours CBC:  Lab 04/10/12 0639 04/09/12 0500 04/08/12 0756 04/08/12 0330  WBC 6.5 8.0 10.5 13.7*  NEUTROABS -- -- 8.7* --  HGB 8.8* 9.7* 10.0* 9.7*  HCT 25.8* 28.4* 29.3* 29.2*  MCV 77.5* 78.0 77.9* 78.9  PLT 205 195 197 201   Cardiac Enzymes:  Lab 04/08/12 0750 04/08/12 0331  CKTOTAL -- --  CKMB -- --  CKMBINDEX -- --  TROPONINI <0.30 <0.30   BNP: No components found with this basename: POCBNP:5 CBG:  Lab 04/10/12 1547 04/10/12 1054 04/10/12 0547 04/09/12 2210 04/09/12 1610  GLUCAP 150* 111* 82 159* 81    Recent Results (from the past 240 hour(s))  CULTURE, BLOOD (ROUTINE X 2)     Status: Normal (Preliminary result)   Collection Time   04/08/12  4:48  AM      Component Value Range Status Comment   Specimen Description BLOOD RIGHT ARM   Final    Special Requests BOTTLES DRAWN AEROBIC ONLY 5CC   Final    Culture  Setup Time 04/08/2012 10:19   Final    Culture     Final    Value:        BLOOD CULTURE RECEIVED NO GROWTH TO DATE CULTURE WILL BE HELD FOR 5 DAYS BEFORE ISSUING A FINAL NEGATIVE REPORT   Report Status PENDING   Incomplete   CULTURE, BLOOD (ROUTINE X 2)     Status: Normal (Preliminary result)   Collection Time   04/08/12  4:55 AM      Component Value Range Status Comment   Specimen Description BLOOD RIGHT HAND   Final    Special Requests BOTTLES DRAWN AEROBIC ONLY 5CC   Final    Culture  Setup Time 04/08/2012 10:19   Final    Culture     Final    Value:        BLOOD CULTURE RECEIVED NO GROWTH TO DATE CULTURE WILL BE HELD FOR 5 DAYS BEFORE ISSUING A FINAL NEGATIVE REPORT   Report Status PENDING   Incomplete      Scheduled Meds:   .  calcium carbonate  1 tablet Oral TID  . ceFEPime (MAXIPIME) IV  1 g Intravenous Q24H  . clopidogrel  75 mg Oral Daily  . diltiazem  300 mg Oral Daily  . enoxaparin (LOVENOX) injection  40 mg Subcutaneous Q24H  . furosemide  10 mg Oral Daily  . hydrALAZINE  25 mg Oral BID  . insulin aspart  0-9 Units Subcutaneous TID WC  . insulin glargine  20 Units Subcutaneous QHS  . levofloxacin  750 mg Oral Q48H  . megestrol  400 mg Oral Daily  . mycophenolate  180 mg Oral BID  . pantoprazole  40 mg Oral Daily  . sodium bicarbonate  1,300 mg Oral BID  . sodium chloride  3 mL Intravenous Q12H  . tacrolimus  5 mg Oral BID WC  . DISCONTD: glipiZIDE  10 mg Oral BID AC  . DISCONTD: levofloxacin (LEVAQUIN) IV  750 mg Intravenous Q48H  . DISCONTD: vancomycin  750 mg Intravenous Q12H   Continuous Infusions:    Krishawna Stiefel, DO  [facility] Pager [phone]  If 7PM-7AM, please contact night-coverage [email or web address] Password [email or web address] 04/10/2012, 7:32 PM   LOS: 2 days

## 2012-04-10 NOTE — Progress Notes (Signed)
PT EVALUATION   04/10/12 1100  PT Visit Information  Last PT Received On 04/10/12  Assistance Needed +2 (for increased ambulation distances)  PT Time Calculation  PT Start Time 1104  PT Stop Time 1122  PT Time Calculation (min) 18 min  Restrictions  Weight Bearing Restrictions No  Home Living  Lives With Other (Comment)  Available Help at Discharge Skilled Nursing Facility  Type of Home Skilled Nursing Facility  Home Access Level entry  Home Layout One level  Additional Comments pt planning on going back to [facility]  Prior Function  Level of Independence Needs assistance  Comments has been at [facility] and requires assistance for all activity.   Communication  Communication No difficulties  Cognition  Overall Cognitive Status Appears within functional limits for tasks assessed/performed  Arousal/Alertness Awake/alert  Orientation Level Appears intact for tasks assessed  Behavior During Session [facility] for tasks performed  Right Lower Extremity Assessment  RLE ROM/Strength/Tone Deficits  RLE ROM/Strength/Tone Deficits Grossly 3-3+/5  RLE Sensation Deficits  RLE Sensation Deficits able to sense touch but decreased compared to LLE  Left Lower Extremity Assessment  LLE ROM/Strength/Tone WFL for tasks assessed  Bed Mobility  Bed Mobility Supine to Sit;Sitting - Scoot to Edge of Bed  Supine to Sit 4: Min assist  Sitting - Scoot to [place] of Bed 4: Min assist  Details for Bed Mobility Assistance VC for sequencing. Min assist only to complete bed mobility with assist through trunk for stability. Assist with cueing at end of transfer  Transfers  Transfers Sit to Stand;Stand to Sit  Sit to Stand 3: Mod assist;With upper extremity assist;From bed  Stand to Sit 3: Mod assist;With upper extremity assist;To chair/3-in-1  Details for Transfer Assistance Attempted stand from bed x 2 with increased cueing for pelvic rotation for upright posture. Cueing for hand placement and safe  transfer to/from RW.   Ambulation/Gait  Ambulation/Gait Assistance 4: Min assist  Ambulation Distance (Feet) 30 Feet  Assistive device Rolling walker  Ambulation/Gait Assistance Details VC for proper sequencing and safe distance to RW. Pt with good control and upright posture during ambulation, although small shuffling steps.   Gait Pattern Step-to pattern;Decreased stride length;Decreased hip/knee flexion - right;Decreased hip/knee flexion - left;Shuffle;Narrow base of support  Gait velocity decreased gait speed  PT - End of Session  Equipment Utilized During Treatment Gait belt  Activity Tolerance Patient tolerated treatment well  Patient left in chair;with call bell/phone within reach;with family/visitor present  Nurse Communication Mobility status (dressing coming lose)  PT Assessment  Clinical Impression Statement Pt admitted from SNF for hospital acquired PNA. Pt with great improvements since last admission. Pt still with decreased functional mobility, R sided strength and other functional limitations from recent CVA. Pt will benefit from skilled PT in the acute care setting in order to maximize functional mobility and safety prior to d/c.   PT Recommendation/Assessment Patient needs continued PT services  PT Problem List Decreased strength;Decreased activity tolerance;Decreased balance;Decreased mobility;Decreased coordination  PT Therapy Diagnosis  Difficulty walking  PT Plan  PT Frequency Min 3X/week  PT Treatment/Interventions Functional mobility training;Therapeutic activities;Therapeutic exercise;Balance training;Patient/family education;DME instruction;Neuromuscular re-education;Gait training  PT Recommendation  Recommendations for Other Services OT consult  Follow Up Recommendations Skilled nursing facility  Equipment Recommended Defer to next venue;Rolling walker with 5" wheels  Individuals Consulted  Consulted and Agree with Results and Recommendations Patient  Acute Rehab  PT Goals  PT Goal Formulation With patient  Time For Goal Achievement 04/24/12  Potential to Achieve Goals Good  Pt will Roll Supine to Right Side with modified independence  PT Goal: Rolling Supine to Right Side - Progress Goal set today  Pt will go Supine/Side to Sit with modified independence  PT Goal: Supine/Side to Sit - Progress Goal set today  Pt will go Sit to Supine/Side with modified independence  PT Goal: Sit to Supine/Side - Progress Progressing toward goal  Pt will go Sit to Stand with supervision  PT Goal: Sit to Stand - Progress Goal set today  Pt will go Stand to Sit with supervision  PT Goal: Stand to Sit - Progress Goal set today  Pt will Transfer Bed to Chair/Chair to Bed with supervision  PT Transfer Goal: Bed to Chair/Chair to Bed - Progress Goal set today  Pt will Ambulate 51 - 150 feet;with supervision;with least restrictive assistive device  PT Goal: Ambulate - Progress Goal set today  PT General Charges  $$ ACUTE PT VISIT 1 Procedure  PT Evaluation  $Initial PT Evaluation Tier II 1 Procedure  PT Treatments  $Gait Training 8-22 mins  Written Expression  Dominant Hand Right    04/10/2012 Milana Kidney DPT PAGER: [phone] OFFICE: [phone]

## 2012-04-11 DIAGNOSIS — N390 Urinary tract infection, site not specified: Secondary | ICD-10-CM

## 2012-04-11 LAB — GLUCOSE, CAPILLARY: Glucose-Capillary: 179 mg/dL — ABNORMAL HIGH (ref 70–99)

## 2012-04-11 MED ORDER — LEVOFLOXACIN 750 MG PO TABS: 750.0000 mg | ORAL_TABLET | ORAL | Status: AC

## 2012-04-11 NOTE — Progress Notes (Signed)
Physical Therapy Treatment Patient Details Name: Evan [name] MRN: [id number] DOB: 1931/10/11 Today's Date: 04/11/2012 Time: 4098-1191 PT Time Calculation (min): 24 min  PT Assessment / Plan / Recommendation Comments on Treatment Session  Patient with increased difficulty with ambulation this session, R sided weakness. At end of session, EMS came to pick pt up to go to SNF. Therapy will continue at [facility].     Follow Up Recommendations  Skilled nursing facility    Barriers to Discharge        Equipment Recommendations  Defer to next venue;Rolling walker with 5" wheels    Recommendations for Other Services    Frequency Min 3X/week   Plan Discharge plan remains appropriate;Frequency remains appropriate    Precautions / Restrictions Precautions Precautions: Fall Restrictions Weight Bearing Restrictions: No   Pertinent Vitals/Pain No complaints of pain    Mobility  Bed Mobility Bed Mobility: Supine to Sit;Sitting - Scoot to Edge of Bed Supine to Sit: 4: Min assist Sitting - Scoot to [place] of Bed: 4: Min assist Details for Bed Mobility Assistance: VC for sequencing. Min assist only to complete bed mobility with assist through trunk for stability. Assist with cueing at end of transfer Transfers Transfers: Sit to Stand;Stand to Sit Sit to Stand: 3: Mod assist;With upper extremity assist;From toilet;From bed Stand to Sit: 3: Mod assist;With upper extremity assist;To chair/3-in-1;To toilet Details for Transfer Assistance: VC for hand placement and sequencing. Increased assistance needed from low surface, with bed raised to stand from bed. Cueing for anterior pelvic tilt to maintain upright posture Ambulation/Gait Ambulation/Gait Assistance: 4: Min assist;3: Mod assist Ambulation Distance (Feet): 30 Feet Assistive device: Rolling walker Ambulation/Gait Assistance Details: Increased assistance required through trunk and pelvis for stability during ambulation. Side stepping  completed to get on stretcher for ambulance, increased assistance needed as pt with unsteadiness and R weak Gait Pattern: Step-to pattern;Decreased stride length;Decreased hip/knee flexion - right;Decreased hip/knee flexion - left;Shuffle;Narrow base of support Gait velocity: decreased gait speed    Exercises     PT Diagnosis:    PT Problem List:   PT Treatment Interventions:     PT Goals Acute Rehab PT Goals PT Goal: Supine/Side to Sit - Progress: Progressing toward goal PT Goal: Sit to Supine/Side - Progress: Progressing toward goal PT Goal: Sit to Stand - Progress: Progressing toward goal PT Goal: Stand to Sit - Progress: Progressing toward goal PT Transfer Goal: Bed to Chair/Chair to Bed - Progress: Progressing toward goal PT Goal: Ambulate - Progress: Progressing toward goal  Visit Information  Last PT Received On: 04/11/12 Assistance Needed: +1    Subjective Data      Cognition  Overall Cognitive Status: Appears within functional limits for tasks assessed/performed Arousal/Alertness: Awake/alert Orientation Level: Appears intact for tasks assessed Behavior During Session: [facility] for tasks performed    Balance     End of Session PT - End of Session Equipment Utilized During Treatment: Gait belt Activity Tolerance: Patient tolerated treatment well Patient left: Other (comment) (on stretcher with EMS, RN in room) Nurse Communication: Mobility status   GP     Milana Kidney 04/11/2012, 12:57 PM

## 2012-04-11 NOTE — Discharge Summary (Signed)
Physician Discharge Summary  MORRILL BOMKAMP JXB:[id number] DOB: [birth year] DOA: 04/08/2012  PCP: Laurena Slimmer, MD  Admit date: 04/08/2012 Discharge date: 04/11/2012  Recommendations for Outpatient Follow-up:  1. Pt will need to follow up with PCP in 2-3 weeks post discharge 2. Please obtain BMP to evaluate electrolytes and kidney function 3. Please also check CBC to evaluate Hg and Hct levels  Discharge Diagnoses:  Principal Problem:  *Healthcare-associated pneumonia Active Problems:  Diabetes mellitus type II  History of renal transplant  Atrial fibrillation Healthcare associated pneumonia  -Clinically improving, WBC decreasing  -Discontinue vancomycin  -Levaquin last dose on September 17 Atrial fibrillation  -Rate controlled on diltiazem  Renal transplant with CKD  -Renal function is stable  Chronic diastolic CHF  -Clinically stable  Anemia of CKD  Recent stroke  -Left pontine infarct on 01/24/2012  -Continue Plavix  Diabetes mellitus type 2  -Hemoglobin A1c 5.8 on 01/25/2012  -Discontinue glipizide due to intermittent episodes of hypoglycemia -Continue Lantus for now   Discharge Condition: Stable  Disposition:  discharge to [facility]  Diet: Carb modified ADA diet Wt Readings from Last 3 Encounters:  04/10/12 80.6 kg (177 lb 11.1 oz)  03/29/12 78.2 kg (172 lb 6.4 oz)  03/04/12 76.998 kg (169 lb 12 oz)    History of present illness:  78 year old male with renal transplant and recent pontine stroke in June 2013, atrial fibrillation, and recent UTI presents with right-sided chest pain. In the emergency department, cardiac enzymes and EKGs were initially negative, but showed atrial fibrillation with mildly elevated heart rate. Chest x-ray revealed right-sided infiltrates concerning for pneumonia. He also had increased sputum production for the last few days. The patient was admitted for treatment of healthcare associated pneumonia.  Hospital Course:  The patient was  started on empiric echo mycin, cefepime, and Levaquin after blood cultures were obtained. The patient's blood cultures remained negative. Serial troponins and EKGs ruled out acute coronary syndrome. The patient's atrial fibrillation remained under good control with diltiazem. The patient was continued on his Plavix for his previous stroke in June 2013. Ultimately, the patient's vancomycin and cefepime were discontinued. The patient will be discharged home with Levaquin with the next stop date 04/22/2012. The patient's glycemic control was well maintained. In fact, the patient had intermittent episodes of hypoglycemia. Glipizide was discontinued. The patient continued on Lantus. The patient's renal function remains stable at his baseline. The patient clinically improved with this antibiotics and started participating in physical therapy. He remained stable with regard to his chronic diastolic CHF without any signs of decompensation. The patient was instructed to continue monitoring his glycemic control with possible readjustment of his diabetes regimen by his primary care physician based upon his glycemic monitoring. Regarding the patient's chronic anemia and his hemoglobin remained stable without any signs of active bleeding. The patient was maintained on his baseline immunosuppressive therapy without any complications.  Consultants none  Discharge Exam: Filed Vitals:   04/10/12 2055  BP: 138/64  Pulse: 68  Temp: 97.5 F (36.4 C)  Resp: 18   Filed Vitals:   04/10/12 0505 04/10/12 1036 04/10/12 1343 04/10/12 2055  BP: 159/70 140/50 118/71 138/64  Pulse: 65 78 80 68  Temp: 98 F (36.7 C)  98.3 F (36.8 C) 97.5 F (36.4 C)  TempSrc: Oral  Oral Oral  Resp: 18 18 19 18   Height:      Weight: 80.6 kg (177 lb 11.1 oz)     SpO2: 100% 99% 100% 100%  General: A&O x 3, NAD, pleasant, cooperative Cardiovascular: RRR, no rub, no gallop, no S3 Respiratory: right basilar crackles, no wheeze, no  rhonchi Abdomen:soft, nontender, nondistended, positive bowel sounds  Discharge Instructions  Discharge Orders    Future Appointments: Provider: Department: Dept Phone: Center:   04/29/2012 12:20 PM Ranelle Oyster, MD [facility] [phone] CPR     Future Orders Please Complete By Expires   Diet - low sodium heart healthy      Increase activity slowly      Discharge instructions      Comments:   Next dose of levaquin in AM 04/12/12;  Last dose to be given on 04/22/12     Medication List  As of 04/11/2012  7:30 AM   STOP taking these medications         ciprofloxacin 250 MG tablet      glipiZIDE 10 MG tablet         TAKE these medications         acetaminophen 500 MG tablet   Commonly known as: TYLENOL   Take 1,000 mg by mouth every 6 (six) hours as needed. For pain      baclofen 20 MG tablet   Commonly known as: LIORESAL   Take 20 mg by mouth 2 (two) times daily as needed. For hiccups      calcium carbonate 750 MG chewable tablet   Commonly known as: TUMS EX   Chew 1 tablet by mouth 3 (three) times daily.      clopidogrel 75 MG tablet   Commonly known as: PLAVIX   Take 75 mg by mouth daily.      diltiazem 300 MG 24 hr capsule   Commonly known as: TIAZAC   Take 300 mg by mouth daily.      furosemide 20 MG tablet   Commonly known as: LASIX   Take 10 mg by mouth daily.      hydrALAZINE 25 MG tablet   Commonly known as: APRESOLINE   Take 25 mg by mouth 2 (two) times daily.      insulin aspart 100 UNIT/ML injection   Commonly known as: novoLOG   Inject 5-10 Units into the skin 3 (three) times daily before meals. cbg 150-300 5 units; 301-400 8 units; 401-500 10 units      insulin glargine 100 UNIT/ML injection   Commonly known as: LANTUS   Inject 20 Units into the skin at bedtime. CBG Over 200 5 units      levofloxacin 750 MG tablet   Commonly known as: LEVAQUIN   Take 1 tablet (750 mg total) by mouth every other day.      loratadine 10 MG tablet    Commonly known as: CLARITIN   Take 10 mg by mouth daily as needed. allergies      megestrol 400 MG/10ML suspension   Commonly known as: MEGACE   Take 400 mg by mouth daily as needed. For appetite      mycophenolate 180 MG EC tablet   Commonly known as: MYFORTIC   Take 180 mg by mouth 2 (two) times daily.      pantoprazole 40 MG tablet   Commonly known as: PROTONIX   Take 40 mg by mouth daily.      polyethylene glycol packet   Commonly known as: MIRALAX / GLYCOLAX   Take 17 g by mouth daily as needed. constipation      sodium bicarbonate 650 MG tablet   Take  1,300 mg by mouth 2 (two) times daily.      tacrolimus 5 MG capsule   Commonly known as: PROGRAF   Take 5 mg by mouth 2 (two) times daily.             The results of significant diagnostics from this hospitalization (including imaging, microbiology, ancillary and laboratory) are listed below for reference.    Significant Diagnostic Studies: Dg Chest 1 View  03/25/2012  *RADIOLOGY REPORT*  Clinical Data: 78 year old male with weakness and history of stroke.  CHEST - 1 VIEW  Comparison: 02/14/2012 and earlier.  Findings: AP view at 1457 hours.  Streaky retrocardiac opacity. Somewhat lower lung volumes overall.  Stable right lung base.  No pneumothorax, pulmonary edema or pleural effusion.  Cardiac size and mediastinal contours are within normal limits.  Visualized tracheal air column is within normal limits.  IMPRESSION: Lower lung volumes.  Streaky retrocardiac opacity could reflect atelectasis or infection/aspiration.   Original Report Authenticated By: Harley Hallmark, M.D.    Dg Chest 2 View  04/08/2012  *RADIOLOGY REPORT*  Clinical Data: Weakness, chest pain, shortness of breath, history diabetes, end-stage renal disease post kidney transplant, prostate cancer  CHEST - 2 VIEW  Comparison: 03/25/2012  Findings: Minimal enlargement of cardiac silhouette. Mediastinal contours normal. Right basilar infiltrate. Mild  peribronchial thickening. Remaining lungs clear. No pleural effusion, pneumothorax, or acute osseous findings.  IMPRESSION: Right basilar infiltrate.   Original Report Authenticated By: Lollie Marrow, M.D.    Ct Head Wo Contrast  03/25/2012  *RADIOLOGY REPORT*  Clinical Data: Fall, increased weakness  CT HEAD WITHOUT CONTRAST  Technique:  Contiguous axial images were obtained from the base of the skull through the vertex without contrast.  Comparison: 03/18/2012  Findings: Interval decrease in size of the left frontal scalp hematoma.  No underlying fracture.  Mild brain atrophy and chronic microvascular white matter ischemic changes.  No acute intracranial hemorrhage, mass lesion, definite infarction, midline shift, herniation, hydrocephalus, or extra-axial fluid collection.  Gray- white matter differentiation maintained.  Cisterns patent.  No cerebellar abnormality.  Orbits are symmetric.  Mastoids and sinuses remain clear.  No depressed skull fracture.  IMPRESSION: Resolving left frontal scalp hematoma.  No acute intracranial finding.  Stable atrophy.   Original Report Authenticated By: Judie Petit. Ruel Favors, M.D.    Ct Head Wo Contrast  03/18/2012  *RADIOLOGY REPORT*  Clinical Data: Fall, head laceration  CT HEAD WITHOUT CONTRAST,CT CERVICAL SPINE WITHOUT CONTRAST  Technique:  Contiguous axial images were obtained from the base of the skull through the vertex without contrast.,Technique: Multidetector CT imaging of the cervical spine was performed. Multiplanar CT image reconstructions were also generated.  Comparison: 01/24/2012  Findings: No skull fracture is noted.  There is a skin irregularity and soft tissue swelling left frontal scalp.  Subcutaneous hematoma in the frontal region measures 1.2 x 2.5 cm.  No intracranial hemorrhage, mass effect or midline shift.  No acute infarction.  No mass lesion is noted on this unenhanced scan.  Small lacunar infarct is noted in the left pons.  IMPRESSION: No acute  intracranial abnormality.  There is soft tissue swelling subcutaneous stranding and subcutaneous hematoma in the left frontal region.  Hematoma measures 2.5 x 1.2 cm.  CT cervical spine without IV contrast:  Axial images of the cervical spine shows no acute fracture or subluxation.  There is disc space flattening with disc calcifications at C5-C6 level.  Mild anterior and mild posterior spurring at C5-C6 level.  Anterior spurring noted lower endplate of the C4 vertebral body.  Mild disc space flattening with mild anterior spurring and mild posterior spurring at C6-C7 level.  Mild anterior spurring lower endplate of the C7 vertebral body.  No prevertebral soft tissue swelling.  Cervical airway is patent.  There is no pneumothorax in visualized lung apices.  Impression: 1.  No acute fracture or subluxation.  Degenerative changes as described above.  Original Report Authenticated By: Natasha Mead, M.D.   Ct Cervical Spine Wo Contrast  03/18/2012  *RADIOLOGY REPORT*  Clinical Data: Fall, head laceration  CT HEAD WITHOUT CONTRAST,CT CERVICAL SPINE WITHOUT CONTRAST  Technique:  Contiguous axial images were obtained from the base of the skull through the vertex without contrast.,Technique: Multidetector CT imaging of the cervical spine was performed. Multiplanar CT image reconstructions were also generated.  Comparison: 01/24/2012  Findings: No skull fracture is noted.  There is a skin irregularity and soft tissue swelling left frontal scalp.  Subcutaneous hematoma in the frontal region measures 1.2 x 2.5 cm.  No intracranial hemorrhage, mass effect or midline shift.  No acute infarction.  No mass lesion is noted on this unenhanced scan.  Small lacunar infarct is noted in the left pons.  IMPRESSION: No acute intracranial abnormality.  There is soft tissue swelling subcutaneous stranding and subcutaneous hematoma in the left frontal region.  Hematoma measures 2.5 x 1.2 cm.  CT cervical spine without IV contrast:  Axial  images of the cervical spine shows no acute fracture or subluxation.  There is disc space flattening with disc calcifications at C5-C6 level.  Mild anterior and mild posterior spurring at C5-C6 level.  Anterior spurring noted lower endplate of the C4 vertebral body.  Mild disc space flattening with mild anterior spurring and mild posterior spurring at C6-C7 level.  Mild anterior spurring lower endplate of the C7 vertebral body.  No prevertebral soft tissue swelling.  Cervical airway is patent.  There is no pneumothorax in visualized lung apices.  Impression: 1.  No acute fracture or subluxation.  Degenerative changes as described above.  Original Report Authenticated By: Natasha Mead, M.D.   Mr Brain Wo Contrast  03/27/2012  *RADIOLOGY REPORT*  Clinical Data: Right-sided weakness.  Fall 1 week ago.  History of pontine infarct 01/2012.  Resulting of the right hemiplegia and dysarthria.  Renal failure.  Prostate cancer.  MRI HEAD WITHOUT CONTRAST  Technique:  Multiplanar, multiecho pulse sequences of the brain and surrounding structures were obtained according to standard protocol without intravenous contrast.  Comparison: 03/25/2012 CT.  01/24/2012 MR.  Findings: No acute infarct.  Remote bilateral pontine infarcts most recent involving the left pons.  Minimal interval improvement of altered signal intensity previously noted involving the occipital lobes.  Mild small vessel disease type changes.  Left frontal subcutaneous hematoma.  No intracranial hemorrhage.  Global atrophy without hydrocephalus.  Altered signal intensity of bone marrow may represent result of prostate metastatic disease versus result of anemia from renal failure.  Partial opacification right and mastoid air cells.  No obstructing lesion noted in the posterior-superior nasopharynx causing eustachian tube dysfunction.  Minimal ethmoid sinus air cells and maxillary sinus mucosal thickening.  Major intracranial vascular structures are patent with  small vertebral arteries and basilar artery.  Exophthalmos.  IMPRESSION:  No acute infarct.  Remote bilateral pontine infarcts most recent involving the left pons.  Left frontal subcutaneous hematoma.  No intracranial hemorrhage.  Global atrophy without hydrocephalus.  Altered signal intensity of bone marrow may represent result  of prostate metastatic disease versus result of anemia from renal failure.  Please see above.   Original Report Authenticated By: Fuller Canada, M.D.      Microbiology: Recent Results (from the past 240 hour(s))  CULTURE, BLOOD (ROUTINE X 2)     Status: Normal (Preliminary result)   Collection Time   04/08/12  4:48 AM      Component Value Range Status Comment   Specimen Description BLOOD RIGHT ARM   Final    Special Requests BOTTLES DRAWN AEROBIC ONLY 5CC   Final    Culture  Setup Time 04/08/2012 10:19   Final    Culture     Final    Value:        BLOOD CULTURE RECEIVED NO GROWTH TO DATE CULTURE WILL BE HELD FOR 5 DAYS BEFORE ISSUING A FINAL NEGATIVE REPORT   Report Status PENDING   Incomplete   CULTURE, BLOOD (ROUTINE X 2)     Status: Normal (Preliminary result)   Collection Time   04/08/12  4:55 AM      Component Value Range Status Comment   Specimen Description BLOOD RIGHT HAND   Final    Special Requests BOTTLES DRAWN AEROBIC ONLY 5CC   Final    Culture  Setup Time 04/08/2012 10:19   Final    Culture     Final    Value:        BLOOD CULTURE RECEIVED NO GROWTH TO DATE CULTURE WILL BE HELD FOR 5 DAYS BEFORE ISSUING A FINAL NEGATIVE REPORT   Report Status PENDING   Incomplete      Labs: Basic Metabolic Panel:  Lab 04/10/12 1610 04/09/12 0500 04/08/12 0756 04/08/12 0330  NA 138 138 140 137  K 4.2 4.7 -- --  CL 109 108 109 107  CO2 18* 17* 18* 18*  GLUCOSE 62* 56* 68* 74  BUN 39* 40* 40* 41*  CREATININE 1.43* 1.61* 1.43* 1.43*  CALCIUM 9.1 9.3 9.6 9.6  MG -- -- -- --  PHOS -- -- -- --   Liver Function Tests:  Lab 04/08/12 0756 04/08/12 0330  AST 16  18  ALT 15 18  ALKPHOS 54 56  BILITOT 0.3 0.2*  PROT 7.3 7.5  ALBUMIN 3.2* 3.4*    Lab 04/08/12 0330  LIPASE 21  AMYLASE --   No results found for this basename: AMMONIA:5 in the last 168 hours CBC:  Lab 04/10/12 0639 04/09/12 0500 04/08/12 0756 04/08/12 0330  WBC 6.5 8.0 10.5 13.7*  NEUTROABS -- -- 8.7* --  HGB 8.8* 9.7* 10.0* 9.7*  HCT 25.8* 28.4* 29.3* 29.2*  MCV 77.5* 78.0 77.9* 78.9  PLT 205 195 197 201   Cardiac Enzymes:  Lab 04/08/12 0750 04/08/12 0331  CKTOTAL -- --  CKMB -- --  CKMBINDEX -- --  TROPONINI <0.30 <0.30   BNP: No components found with this basename: POCBNP:5 CBG:  Lab 04/11/12 0613 04/10/12 2214 04/10/12 1547 04/10/12 1054 04/10/12 0547  GLUCAP 179* 219* 150* 111* 82    Time coordinating discharge:  Greater than 30 minutes  Signed:  Brynnlie Unterreiner, DO [facility] Pager: [phone] 04/11/2012, 7:30 AM

## 2012-04-11 NOTE — Progress Notes (Signed)
Clinical social worker assisted with patient discharge to skilled nursing facility, [facility].  CSW addressed all family questions and concerns. CSW copied chart and added all important documents. CSW also set up patient transportation with [facility]. Clinical Social Worker will sign off for now as social work intervention is no longer needed.   Kaidance Pantoja, MSW, LCSWA [phone] 

## 2012-04-11 NOTE — Progress Notes (Signed)
Retro ur ins review. 

## 2012-04-11 NOTE — Clinical Social Work Psychosocial (Signed)
Late Entry: Clinical Social Work Department BRIEF PSYCHOSOCIAL ASSESSMENT 04/11/2012  Patient:  Evan [name], Evan [name]     Account Number:  [id number]     Admit date:  04/08/2012  Clinical Social Worker:  Juliette Mangle  Date/Time:  04/10/2012 01:00 PM  Referred by:  Physician  Date Referred:  04/09/2012 Referred for  SNF Placement   Other Referral:   Interview type:  Patient Other interview type:    PSYCHOSOCIAL DATA Living Status:  WIFE Admitted from facility:  [facility] Level of care:  Skilled Nursing Facility Primary support name:  [name] Primary support relationship to patient:  SPOUSE Degree of support available:   Strong and vested    CURRENT CONCERNS Current Concerns  Post-Acute Placement   Other Concerns:    SOCIAL WORK ASSESSMENT / PLAN CSW received a referral that patient was from  a facility. CSW met with patient at bedside. CSW introduced self, explained role and discussed returning to SNF.  Patient reported that he was at [facility] and is agreeable to returning. CSW will contact the facility to make sure patient can return. CSW will continue to follow and will assist with all d/c needs.   Assessment/plan status:  Psychosocial Support/Ongoing Assessment of Needs Other assessment/ plan:   Information/referral to community resources:   none noted    PATIENT'S/FAMILY'S RESPONSE TO PLAN OF CARE: Patient was very appreciative of support and information provided by CSW. CSW will continue to follow and will assists with all d/c needs.      Sabino Niemann, MSW, [employer] [phone]

## 2012-04-14 LAB — CULTURE, BLOOD (ROUTINE X 2): Culture: NO GROWTH

## 2012-04-29 ENCOUNTER — Encounter: Payer: Medicare Other | Attending: Physical Medicine & Rehabilitation | Admitting: Physical Medicine & Rehabilitation

## 2012-04-29 ENCOUNTER — Encounter: Payer: Self-pay | Admitting: Physical Medicine & Rehabilitation

## 2012-04-29 VITALS — BP 143/40 | HR 76 | Resp 16 | Ht 71.5 in | Wt 169.0 lb

## 2012-04-29 DIAGNOSIS — K219 Gastro-esophageal reflux disease without esophagitis: Secondary | ICD-10-CM | POA: Insufficient documentation

## 2012-04-29 DIAGNOSIS — Z8546 Personal history of malignant neoplasm of prostate: Secondary | ICD-10-CM | POA: Insufficient documentation

## 2012-04-29 DIAGNOSIS — I639 Cerebral infarction, unspecified: Secondary | ICD-10-CM

## 2012-04-29 DIAGNOSIS — I6350 Cerebral infarction due to unspecified occlusion or stenosis of unspecified cerebral artery: Secondary | ICD-10-CM

## 2012-04-29 DIAGNOSIS — G9341 Metabolic encephalopathy: Secondary | ICD-10-CM

## 2012-04-29 DIAGNOSIS — J189 Pneumonia, unspecified organism: Secondary | ICD-10-CM

## 2012-04-29 DIAGNOSIS — M1710 Unilateral primary osteoarthritis, unspecified knee: Secondary | ICD-10-CM | POA: Insufficient documentation

## 2012-04-29 DIAGNOSIS — Z8673 Personal history of transient ischemic attack (TIA), and cerebral infarction without residual deficits: Secondary | ICD-10-CM

## 2012-04-29 DIAGNOSIS — B9689 Other specified bacterial agents as the cause of diseases classified elsewhere: Secondary | ICD-10-CM | POA: Insufficient documentation

## 2012-04-29 DIAGNOSIS — M238X9 Other internal derangements of unspecified knee: Secondary | ICD-10-CM | POA: Insufficient documentation

## 2012-04-29 DIAGNOSIS — E119 Type 2 diabetes mellitus without complications: Secondary | ICD-10-CM | POA: Insufficient documentation

## 2012-04-29 DIAGNOSIS — I4891 Unspecified atrial fibrillation: Secondary | ICD-10-CM

## 2012-04-29 DIAGNOSIS — I69998 Other sequelae following unspecified cerebrovascular disease: Secondary | ICD-10-CM | POA: Insufficient documentation

## 2012-04-29 DIAGNOSIS — N390 Urinary tract infection, site not specified: Secondary | ICD-10-CM | POA: Insufficient documentation

## 2012-04-29 DIAGNOSIS — Z940 Kidney transplant status: Secondary | ICD-10-CM | POA: Insufficient documentation

## 2012-04-29 NOTE — Progress Notes (Signed)
Subjective:    Patient ID: Evan, [name], male    DOB: [birth year], 78 y.o.   MRN: [id number]  HPI  [name] is back regarding his left paracentral pontine infarct. Unfortunately, he had a fall a few weeks back. He suffered a scalp laceration but unfortunately no intracranial injuries. He also was set back by a UTI and a pneumonia which required hospitalization. All of these issues have set him back and he still requires a significant amount of assistance with self-care, mobility, meds, etc.  Sugars have been an issue also. They have been low in the am. Several adjustments have been made, but his sugar this am was still 71. He has added an HS snack  His back has been bothering him, moreso in the right thoracic area. He uses heat which tends to help it. Therapy has been working on stretches also.  The patient and his wife seem to be no closer on a decision regarding long term living than they have been in the past. [name] still hopes that he might regain some of the independence he was so proud of before.    Pain Inventory Average Pain 7 Pain Right Now 0 My pain is intermittent and stabbing  In the last 24 hours, has pain interfered with the following? General activity 0 Relation with others 0 Enjoyment of life 0 What TIME of day is your pain at its worst? evening Sleep (in general) Fair  Pain is worse with: sitting Pain improves with: medication Relief from Meds: 5  Mobility walk with assistance use a walker ability to climb steps?  no do you drive?  no use a wheelchair  Function retired I need assistance with the following:  dressing, bathing and toileting  Neuro/Psych bladder control problems weakness trouble walking  Prior Studies Any changes since last visit?  no  Physicians involved in your care Any changes since last visit?  no   History reviewed. No pertinent family history. History   Social History  . Marital Status: Married    Spouse Name:  N/A    Number of Children: N/A  . Years of Education: N/A   Social History Main Topics  . Smoking status: Former Smoker -- 15 years    Types: Cigarettes    Quit date: 01/24/1983  . Smokeless tobacco: Never Used  . Alcohol Use: No  . Drug Use: No  . Sexually Active: Not Currently   Other Topics Concern  . None   Social History Narrative  . None   Past Surgical History  Procedure Date  . Kidney transplant   . Prostate surgery approx. 2003    seed implant   Past Medical History  Diagnosis Date  . H/O kidney transplant   . Diabetes mellitus   . Cancer     hx prostate cancer  . ESRD (end stage renal disease) 2011    hemodialysis started Sept 2011, cadaveric (double) renal  transplant Mar 2012 at [facility]  . Stroke   . GERD (gastroesophageal reflux disease)   . Hypertension   . Dysrhythmia     hx of atrial fibrilation  . Shortness of breath     " sometimes "  . Pneumonia 04/08/2012  . Heart murmur   . Arthritis     hands   BP 143/40  Pulse 76  Resp 16  Ht 5' 11.5" (1.816 m)  Wt 169 lb (76.658 kg)  BMI 23.24 kg/m2  SpO2 100%      Review  of Systems  HENT: Negative.   Eyes: Negative.   Respiratory: Negative.   Cardiovascular: Negative.   Gastrointestinal: Negative.   Genitourinary: Negative.   Musculoskeletal: Positive for gait problem.  Skin: Negative.   Neurological: Positive for weakness.  Hematological: Negative.   Psychiatric/Behavioral: Negative.        Objective:   Physical Exam General: Alert and oriented x 3, No apparent distress  HEENT: Head is normocephalic, atraumatic, PERRLA, EOMI, sclera anicteric, oral mucosa pink and moist, dentition intact, ext ear canals clear,  Neck: Supple without JVD or lymphadenopathy  Heart: Reg rate and rhythm. No murmurs rubs or gallops  Chest: CTA bilaterally without wheezes, rales, or rhonchi; no distress  Abdomen: Soft, non-tender, non-distended, bowel sounds positive.  Extremities: No clubbing, cyanosis, 2+  edema RLE, trace LLE. Pulses are 2+  Skin: Clean and intact without signs of breakdown  Neuro: Cognitvely intact. Right central 7 and tongue deviation. Speech generally clear. RUE is grossly 3-4/5 with more weakness in the HI. RLE is 4/t with contracture at the right knee. Sensation is grossly intact. DTR's are 1+ generally.  Musculoskeletal:TENDS to list to the right a bit. His right thoracic paraspinals and lats are tender to touch and tight. Psych: Pt's affect is appropriate. Pt is cooperative   Assessment & Plan:   Assessment:  1. Left paracentral pontine infarction.  2. Subcutaneous Lovenox for deep venous thrombosis prophylaxis.  3. Diabetes mellitus.  4. History of renal transplant March 2012.  5. Gastroesophageal reflux disease.  6. History of prostate cancer.  7. Citrobacter urinary tract infection.  8. Right knee instability with mild osteoarthritis.  Plan:  1. The patient continues to progress, albeit slowly. Discussed long term living arrangements including ALF, personal care assistant. They need to establish goals and a plan with the team at the SNF. 2. Continue with knee brace wear on the right. He benefits from this for extra stability, decreased pain, and increased confidence.  3. Discuss DM mgt with MD at [facility]. I reviewed several major principles with the patient and his wife today. 4. Try lidoderm patch for his right sided back pain. A lot of this is myofascial and postural. Therapy needs to continue to address posture, strength, ROM, etc.  Heat is quite useful and he should continue with this as well. 5. RTC 4 months. 45 minutes of direct patient time were spent

## 2012-04-29 NOTE — Patient Instructions (Signed)
RECOMMENDATIONS:  1. LIDODERM PATCHES 1-2 PER DAY (NIGHT TIME) FOR RIGHT THORACIC SPINE PAIN.  2. PHYSICAL THERAPY SHOULD CONTINUE TO ADDRESS POSTURE, TRUNCAL STRENGTH AND RANGE OF MOTION  3. DIABETES MANAGEMENT PER PRIMARY TEAM AT [facility]  4. CONSIDER LONGER TERM OPTIONS FOR CARE INCLUDING PERSONAL CARE ASSISTANT AT HOME OR ASSISTED LIVING.  5. FOLLOW UP WITH ME IN ABOUT 4 MONTHS

## 2012-05-16 ENCOUNTER — Encounter (HOSPITAL_COMMUNITY)
Admission: RE | Admit: 2012-05-16 | Discharge: 2012-05-16 | Disposition: A | Discharge status: Home / Self Care | Payer: Medicare Other | Source: Ambulatory Visit | Attending: Nephrology | Admitting: Nephrology

## 2012-05-16 DIAGNOSIS — N2581 Secondary hyperparathyroidism of renal origin: Secondary | ICD-10-CM | POA: Insufficient documentation

## 2012-05-16 DIAGNOSIS — N182 Chronic kidney disease, stage 2 (mild): Secondary | ICD-10-CM | POA: Insufficient documentation

## 2012-05-16 DIAGNOSIS — D649 Anemia, unspecified: Secondary | ICD-10-CM | POA: Insufficient documentation

## 2012-05-16 LAB — CBC
Hemoglobin: 9.5 g/dL — ABNORMAL LOW (ref 13.0–17.0)
MCHC: 33 g/dL (ref 30.0–36.0)
Platelets: 224 10*3/uL (ref 150–400)
RDW: 14.1 % (ref 11.5–15.5)

## 2012-05-16 LAB — BASIC METABOLIC PANEL
BUN: 32 mg/dL — ABNORMAL HIGH (ref 6–23)
GFR calc Af Amer: 46 mL/min — ABNORMAL LOW (ref >90)
GFR calc non Af Amer: 39 mL/min — ABNORMAL LOW (ref >90)
Potassium: 4.3 mEq/L (ref 3.5–5.1)

## 2012-05-16 LAB — IRON AND TIBC: Saturation Ratios: 26 % (ref 20–55)

## 2012-05-16 MED ORDER — DARBEPOETIN ALFA-POLYSORBATE 100 MCG/0.5ML IJ SOLN
INTRAMUSCULAR | Status: AC
  Administered 2012-05-16: 100 ug via SUBCUTANEOUS
  Filled 2012-05-16: qty 0.5

## 2012-05-16 MED ORDER — EPOETIN ALFA 20000 UNIT/ML IJ SOLN: 20000.0000 [IU] | INTRAMUSCULAR | Status: DC

## 2012-05-18 LAB — TACROLIMUS LEVEL: Tacrolimus (FK506) - LabCorp: 3.6 ng/mL

## 2012-05-20 ENCOUNTER — Other Ambulatory Visit (HOSPITAL_COMMUNITY): Payer: Self-pay | Admitting: *Deleted

## 2012-05-20 ENCOUNTER — Other Ambulatory Visit: Payer: Self-pay | Admitting: Nephrology

## 2012-05-21 LAB — BK VIRUS QUANT PCR, URINE: BK virus DNA, quant PCR: <500 copies/mL (ref <500)

## 2012-05-30 ENCOUNTER — Encounter (HOSPITAL_COMMUNITY)
Admission: RE | Admit: 2012-05-30 | Discharge: 2012-05-30 | Disposition: A | Discharge status: Home / Self Care | Payer: Medicare Other | Source: Ambulatory Visit | Attending: Nephrology | Admitting: Nephrology

## 2012-05-30 MED ORDER — DARBEPOETIN ALFA-POLYSORBATE 150 MCG/0.3ML IJ SOLN
150.0000 ug | INTRAMUSCULAR | Status: DC
  Administered 2012-05-30: 150 ug via INTRAVENOUS

## 2012-05-30 MED ORDER — DARBEPOETIN ALFA-POLYSORBATE 150 MCG/0.3ML IJ SOLN
INTRAMUSCULAR | Status: AC
  Administered 2012-05-30: 150 ug via INTRAVENOUS
  Filled 2012-05-30: qty 0.3

## 2012-06-10 ENCOUNTER — Encounter: Payer: Self-pay | Admitting: Vascular Surgery

## 2012-06-12 ENCOUNTER — Other Ambulatory Visit (HOSPITAL_COMMUNITY): Payer: Self-pay | Admitting: *Deleted

## 2012-06-13 ENCOUNTER — Encounter (HOSPITAL_COMMUNITY): Payer: Medicare Other

## 2012-06-26 ENCOUNTER — Encounter (HOSPITAL_COMMUNITY): Payer: Medicare Other

## 2012-06-27 ENCOUNTER — Other Ambulatory Visit (HOSPITAL_COMMUNITY): Payer: Self-pay | Admitting: *Deleted

## 2012-06-30 ENCOUNTER — Encounter (HOSPITAL_COMMUNITY): Payer: Medicare Other

## 2012-07-07 ENCOUNTER — Other Ambulatory Visit (HOSPITAL_COMMUNITY): Payer: Self-pay | Admitting: Nephrology

## 2012-07-07 DIAGNOSIS — R748 Abnormal levels of other serum enzymes: Secondary | ICD-10-CM

## 2012-07-07 DIAGNOSIS — N1330 Unspecified hydronephrosis: Secondary | ICD-10-CM

## 2012-07-08 ENCOUNTER — Other Ambulatory Visit (HOSPITAL_COMMUNITY): Payer: Self-pay | Admitting: Nephrology

## 2012-07-08 ENCOUNTER — Encounter (HOSPITAL_COMMUNITY)
Admission: RE | Admit: 2012-07-08 | Discharge: 2012-07-08 | Disposition: A | Discharge status: Home / Self Care | Payer: Medicare Other | Source: Ambulatory Visit | Attending: Nephrology | Admitting: Nephrology

## 2012-07-08 ENCOUNTER — Ambulatory Visit (HOSPITAL_COMMUNITY)
Admission: RE | Admit: 2012-07-08 | Discharge: 2012-07-08 | Disposition: A | Discharge status: Home / Self Care | Payer: Medicare Other | Source: Ambulatory Visit | Attending: Nephrology | Admitting: Nephrology

## 2012-07-08 DIAGNOSIS — Z940 Kidney transplant status: Secondary | ICD-10-CM

## 2012-07-08 DIAGNOSIS — N182 Chronic kidney disease, stage 2 (mild): Secondary | ICD-10-CM | POA: Insufficient documentation

## 2012-07-08 DIAGNOSIS — N2581 Secondary hyperparathyroidism of renal origin: Secondary | ICD-10-CM | POA: Insufficient documentation

## 2012-07-08 DIAGNOSIS — D649 Anemia, unspecified: Secondary | ICD-10-CM | POA: Insufficient documentation

## 2012-07-08 DIAGNOSIS — N1330 Unspecified hydronephrosis: Secondary | ICD-10-CM

## 2012-07-08 DIAGNOSIS — R944 Abnormal results of kidney function studies: Secondary | ICD-10-CM | POA: Insufficient documentation

## 2012-07-08 LAB — DIFFERENTIAL
Basophils Absolute: 0 10*3/uL (ref 0.0–0.1)
Basophils Relative: 0 % (ref 0–1)
Eosinophils Absolute: 0.1 10*3/uL (ref 0.0–0.7)
Monocytes Relative: 5 % (ref 3–12)
Neutro Abs: 5.2 10*3/uL (ref 1.7–7.7)
Neutrophils Relative %: 76 % (ref 43–77)

## 2012-07-08 LAB — CBC
Hemoglobin: 12.7 g/dL — ABNORMAL LOW (ref 13.0–17.0)
MCH: 27.4 pg (ref 26.0–34.0)
MCHC: 35 g/dL (ref 30.0–36.0)
Platelets: 162 10*3/uL (ref 150–400)

## 2012-07-08 LAB — IRON AND TIBC
Iron: 107 ug/dL (ref 42–135)
Saturation Ratios: 47 % (ref 20–55)
TIBC: 227 ug/dL (ref 215–435)

## 2012-07-08 LAB — RENAL FUNCTION PANEL
Albumin: 4.1 g/dL (ref 3.5–5.2)
CO2: 25 mEq/L (ref 19–32)
Calcium: 10 mg/dL (ref 8.4–10.5)
Chloride: 99 mEq/L (ref 96–112)
GFR calc Af Amer: 28 mL/min — ABNORMAL LOW (ref >90)
GFR calc non Af Amer: 24 mL/min — ABNORMAL LOW (ref >90)
Sodium: 135 mEq/L (ref 135–145)

## 2012-07-08 MED ORDER — DARBEPOETIN ALFA-POLYSORBATE 150 MCG/0.3ML IJ SOLN: 150.0000 ug | INTRAMUSCULAR | Status: DC

## 2012-07-09 ENCOUNTER — Ambulatory Visit (HOSPITAL_COMMUNITY): Payer: Medicare Other

## 2012-07-10 LAB — TACROLIMUS LEVEL: Tacrolimus (FK506) - LabCorp: 4.8 ng/mL

## 2012-07-22 ENCOUNTER — Encounter (HOSPITAL_COMMUNITY): Payer: Medicare Other

## 2012-07-25 ENCOUNTER — Encounter (HOSPITAL_COMMUNITY)
Admission: RE | Admit: 2012-07-25 | Discharge: 2012-07-25 | Disposition: A | Discharge status: Home / Self Care | Payer: Medicare Other | Source: Ambulatory Visit | Attending: Nephrology | Admitting: Nephrology

## 2012-07-25 MED ORDER — DARBEPOETIN ALFA-POLYSORBATE 150 MCG/0.3ML IJ SOLN
INTRAMUSCULAR | Status: AC
  Filled 2012-07-25: qty 0.3

## 2012-07-25 MED ORDER — DARBEPOETIN ALFA-POLYSORBATE 150 MCG/0.3ML IJ SOLN
150.0000 ug | INTRAMUSCULAR | Status: DC
  Administered 2012-07-25: 150 ug via SUBCUTANEOUS

## 2012-08-08 ENCOUNTER — Encounter (HOSPITAL_COMMUNITY)
Admission: RE | Admit: 2012-08-08 | Discharge: 2012-08-08 | Disposition: A | Discharge status: Home / Self Care | Payer: Medicare Other | Source: Ambulatory Visit | Attending: Nephrology | Admitting: Nephrology

## 2012-08-08 DIAGNOSIS — N2581 Secondary hyperparathyroidism of renal origin: Secondary | ICD-10-CM | POA: Insufficient documentation

## 2012-08-08 DIAGNOSIS — N182 Chronic kidney disease, stage 2 (mild): Secondary | ICD-10-CM | POA: Insufficient documentation

## 2012-08-08 DIAGNOSIS — D649 Anemia, unspecified: Secondary | ICD-10-CM | POA: Insufficient documentation

## 2012-08-08 LAB — POCT HEMOGLOBIN-HEMACUE: Hemoglobin: 12.5 g/dL — ABNORMAL LOW (ref 13.0–17.0)

## 2012-08-08 LAB — RENAL FUNCTION PANEL
BUN: 39 mg/dL — ABNORMAL HIGH (ref 6–23)
CO2: 23 mEq/L (ref 19–32)
Calcium: 9.5 mg/dL (ref 8.4–10.5)
Chloride: 101 mEq/L (ref 96–112)
Creatinine, Ser: 1.73 mg/dL — ABNORMAL HIGH (ref 0.50–1.35)
GFR calc non Af Amer: 36 mL/min — ABNORMAL LOW (ref >90)
Glucose, Bld: 164 mg/dL — ABNORMAL HIGH (ref 70–99)

## 2012-08-08 MED ORDER — DARBEPOETIN ALFA-POLYSORBATE 150 MCG/0.3ML IJ SOLN: 150.0000 ug | INTRAMUSCULAR | Status: DC

## 2012-08-10 LAB — TACROLIMUS LEVEL: Tacrolimus (FK506) - LabCorp: 9.2 ng/mL

## 2012-08-11 LAB — CMV (CYTOMEGALOVIRUS) DNA ULTRAQUANT, PCR: CMV DNA Quant: <200 copies/mL (ref <200)

## 2012-08-21 ENCOUNTER — Other Ambulatory Visit (HOSPITAL_COMMUNITY): Payer: Self-pay

## 2012-08-22 ENCOUNTER — Encounter (HOSPITAL_COMMUNITY): Payer: Medicare Other

## 2012-08-22 ENCOUNTER — Encounter (HOSPITAL_COMMUNITY)
Admission: RE | Admit: 2012-08-22 | Discharge: 2012-08-22 | Disposition: A | Discharge status: Home / Self Care | Payer: Medicare Other | Source: Ambulatory Visit | Attending: Nephrology | Admitting: Nephrology

## 2012-08-22 MED ORDER — DARBEPOETIN ALFA-POLYSORBATE 150 MCG/0.3ML IJ SOLN: 150.0000 ug | INTRAMUSCULAR | Status: DC

## 2012-08-23 LAB — TACROLIMUS LEVEL: Tacrolimus (FK506) - LabCorp: 4.7 ng/mL

## 2012-08-25 ENCOUNTER — Encounter: Payer: Self-pay | Admitting: Physical Medicine & Rehabilitation

## 2012-08-25 ENCOUNTER — Encounter: Payer: Medicare Other | Attending: Physical Medicine & Rehabilitation | Admitting: Physical Medicine & Rehabilitation

## 2012-08-25 VITALS — BP 132/62 | HR 63 | Resp 14 | Ht 71.5 in | Wt 174.0 lb

## 2012-08-25 DIAGNOSIS — Z8673 Personal history of transient ischemic attack (TIA), and cerebral infarction without residual deficits: Secondary | ICD-10-CM

## 2012-08-25 DIAGNOSIS — M1710 Unilateral primary osteoarthritis, unspecified knee: Secondary | ICD-10-CM | POA: Insufficient documentation

## 2012-08-25 DIAGNOSIS — Z8546 Personal history of malignant neoplasm of prostate: Secondary | ICD-10-CM | POA: Insufficient documentation

## 2012-08-25 DIAGNOSIS — B9689 Other specified bacterial agents as the cause of diseases classified elsewhere: Secondary | ICD-10-CM | POA: Insufficient documentation

## 2012-08-25 DIAGNOSIS — E119 Type 2 diabetes mellitus without complications: Secondary | ICD-10-CM | POA: Insufficient documentation

## 2012-08-25 DIAGNOSIS — N390 Urinary tract infection, site not specified: Secondary | ICD-10-CM | POA: Insufficient documentation

## 2012-08-25 DIAGNOSIS — Z940 Kidney transplant status: Secondary | ICD-10-CM | POA: Insufficient documentation

## 2012-08-25 DIAGNOSIS — Z7901 Long term (current) use of anticoagulants: Secondary | ICD-10-CM | POA: Insufficient documentation

## 2012-08-25 DIAGNOSIS — K565 Intestinal adhesions [bands], unspecified as to partial versus complete obstruction: Secondary | ICD-10-CM | POA: Insufficient documentation

## 2012-08-25 DIAGNOSIS — I6350 Cerebral infarction due to unspecified occlusion or stenosis of unspecified cerebral artery: Secondary | ICD-10-CM | POA: Insufficient documentation

## 2012-08-25 NOTE — Patient Instructions (Signed)
CONTINUE WORKING ON YOUR CORE MUSCLE AND LEG STRENGTH AT HOME !  YOU ARE DOING A GOOD JOB

## 2012-08-25 NOTE — Progress Notes (Signed)
Subjective:    Patient ID: Evan [name], male    DOB: May 11, 1932, 79 y.o.   MRN: [id number]  HPI  [name] is back regarding his left pontine infarct. He has come home from [facility] and has been home for about 2 months. He participated in therapy with [facility] for most of that time. Currently, he has a privately hired CNA's who are assisting him at home. Typically they are assisting him with bathing and dressing amongst other basic activities. They are there in the morning and again later in the afternoon.   He is using a walker for exercise only or to transfer into the shower. He denies pain. He is now feeding himself with his right hand. He does note occasional cough, especially with the liquids.   Dr. Chestine Spore follows his diabetes and Dr. Caryn Section follows him for his renal needs.  Pain Inventory Average Pain 0 Pain Right Now 0 My pain is no pain  In the last 24 hours, has pain interfered with the following? General activity 4 Relation with others 4 Enjoyment of life 4 What TIME of day is your pain at its worst? no pain Sleep (in general) Fair  Pain is worse with: bending Pain improves with: medication Relief from Meds: 5  Mobility walk with assistance how many minutes can you walk? 10 do you drive?  no use a wheelchair needs help with transfers Do you have any goals in this area?  yes  Function I need assistance with the following:  dressing, bathing and toileting  Neuro/Psych No problems in this area  Prior Studies Any changes since last visit?  no  Physicians involved in your care Any changes since last visit?  no   History reviewed. No pertinent family history. History   Social History  . Marital Status: Married    Spouse Name: N/A    Number of Children: N/A  . Years of Education: N/A   Social History Main Topics  . Smoking status: Former Smoker -- 15 years    Types: Cigarettes    Quit date: 01/24/1983  . Smokeless tobacco: Never Used  . Alcohol  Use: No  . Drug Use: No  . Sexually Active: Not Currently   Other Topics Concern  . None   Social History Narrative  . None   Past Surgical History  Procedure Date  . Kidney transplant   . Prostate surgery approx. 2003    seed implant   Past Medical History  Diagnosis Date  . H/O kidney transplant   . Diabetes mellitus   . Cancer     hx prostate cancer  . ESRD (end stage renal disease) 2011    hemodialysis started Sept 2011, cadaveric (double) renal  transplant Mar 2012 at [facility]  . Stroke   . GERD (gastroesophageal reflux disease)   . Hypertension   . Dysrhythmia     hx of atrial fibrilation  . Shortness of breath     " sometimes "  . Pneumonia 04/08/2012  . Heart murmur   . Arthritis     hands   BP 132/62  Pulse 63  Resp 14  Ht 5' 11.5" (1.816 m)  Wt 174 lb (78.926 kg)  BMI 23.93 kg/m2  SpO2 99%    Review of Systems  Gastrointestinal: Positive for constipation.  All other systems reviewed and are negative.       Objective:   Physical Exam General: Alert and oriented x 3, No apparent distress  HEENT:  Head is normocephalic, atraumatic, PERRLA, EOMI, sclera anicteric, oral mucosa pink and moist, dentition intact, ext ear canals clear,  Neck: Supple without JVD or lymphadenopathy  Heart: Reg rate and rhythm. No murmurs rubs or gallops  Chest: CTA bilaterally without wheezes, rales, or rhonchi; no distress  Abdomen: Soft, non-tender, non-distended, bowel sounds positive.  Extremities: No clubbing, cyanosis, 2+ edema RLE, trace LLE. Pulses are 2+  Skin: Clean and intact without signs of breakdown  Neuro: Cognitvely intact. Right central 7 and tongue deviation. Speech generally clear. Processing remains delayed. RUE is grossly 3+ to 4/5 with more weakness in the HI. RLE is 4/5 with contracture at the right knee of 20 degrees. Movement remains deliberate. Sensation is grossly intact for pain sensation. Light touch is a little diminished.. DTR's are 1+ generally.    Musculoskeletal: better sitting posture, but tends to sit with his head forward and shoulder rotated internally.  Psych: Pt's affect is appropriate. Pt is cooperative  Assessment & Plan:   Assessment:  1. Left paracentral pontine infarction.  2. Subcutaneous Lovenox for deep venous thrombosis prophylaxis.  3. Diabetes mellitus.  4. History of renal transplant March 2012.  5. Gastroesophageal reflux disease.  6. History of prostate cancer.  7. Citrobacter urinary tract infection.  8. Right knee instability with mild osteoarthritis.    Plan:  1. Continue with HEP, especially to increase mobility, core muscle strength.  2. Discussed the fact that his recovery will be a work in progress which will require continued exercises and management of his medical comorbidities.  3. DM management per Dr. Chestine Spore. 4. Discussed appropriate swallowing techniques. Most importantly reviewed deliberate eating and drinking, and utilization of compensatory swallowing technique.  5. F/u with me PRN. 30 minutes of face to face patient care time were spent during this visit. All questions were encouraged and answered.

## 2012-09-04 ENCOUNTER — Other Ambulatory Visit (HOSPITAL_COMMUNITY): Payer: Self-pay | Admitting: *Deleted

## 2012-09-05 ENCOUNTER — Encounter (HOSPITAL_COMMUNITY): Payer: Medicare Other

## 2012-09-10 ENCOUNTER — Other Ambulatory Visit (HOSPITAL_COMMUNITY): Payer: Self-pay | Admitting: *Deleted

## 2012-09-11 ENCOUNTER — Encounter (HOSPITAL_COMMUNITY)
Admission: RE | Admit: 2012-09-11 | Discharge: 2012-09-11 | Disposition: A | Discharge status: Home / Self Care | Payer: Medicare Other | Source: Ambulatory Visit | Attending: Nephrology | Admitting: Nephrology

## 2012-09-11 DIAGNOSIS — N2581 Secondary hyperparathyroidism of renal origin: Secondary | ICD-10-CM | POA: Insufficient documentation

## 2012-09-11 DIAGNOSIS — D649 Anemia, unspecified: Secondary | ICD-10-CM | POA: Insufficient documentation

## 2012-09-11 DIAGNOSIS — N182 Chronic kidney disease, stage 2 (mild): Secondary | ICD-10-CM | POA: Insufficient documentation

## 2012-09-11 LAB — DIFFERENTIAL
Eosinophils Absolute: 0.2 10*3/uL (ref 0.0–0.7)
Eosinophils Relative: 3 % (ref 0–5)
Lymphs Abs: 1.1 10*3/uL (ref 0.7–4.0)
Monocytes Relative: 5 % (ref 3–12)

## 2012-09-11 LAB — RENAL FUNCTION PANEL
Albumin: 3.9 g/dL (ref 3.5–5.2)
BUN: 52 mg/dL — ABNORMAL HIGH (ref 6–23)
Chloride: 103 mEq/L (ref 96–112)
Creatinine, Ser: 1.88 mg/dL — ABNORMAL HIGH (ref 0.50–1.35)

## 2012-09-11 LAB — CBC
MCH: 25.6 pg — ABNORMAL LOW (ref 26.0–34.0)
MCV: 75.7 fL — ABNORMAL LOW (ref 78.0–100.0)
Platelets: 168 10*3/uL (ref 150–400)
RBC: 4.49 MIL/uL (ref 4.22–5.81)

## 2012-09-11 LAB — IRON AND TIBC: Iron: 119 ug/dL (ref 42–135)

## 2012-09-11 MED ORDER — DARBEPOETIN ALFA-POLYSORBATE 200 MCG/0.4ML IJ SOLN
INTRAMUSCULAR | Status: AC
  Filled 2012-09-11: qty 0.4

## 2012-09-11 MED ORDER — DARBEPOETIN ALFA-POLYSORBATE 200 MCG/0.4ML IJ SOLN
200.0000 ug | INTRAMUSCULAR | Status: DC
Start: 2012-09-11 — End: 2012-09-12
  Administered 2012-09-11: 200 ug via SUBCUTANEOUS

## 2012-09-12 ENCOUNTER — Encounter (HOSPITAL_COMMUNITY): Payer: Medicare Other

## 2012-10-08 ENCOUNTER — Other Ambulatory Visit (HOSPITAL_COMMUNITY): Payer: Self-pay | Admitting: *Deleted

## 2012-10-09 ENCOUNTER — Encounter (HOSPITAL_COMMUNITY)
Admission: RE | Admit: 2012-10-09 | Discharge: 2012-10-09 | Disposition: A | Discharge status: Home / Self Care | Payer: Medicare Other | Source: Ambulatory Visit | Attending: Nephrology | Admitting: Nephrology

## 2012-10-09 DIAGNOSIS — N182 Chronic kidney disease, stage 2 (mild): Secondary | ICD-10-CM | POA: Insufficient documentation

## 2012-10-09 DIAGNOSIS — N2581 Secondary hyperparathyroidism of renal origin: Secondary | ICD-10-CM | POA: Insufficient documentation

## 2012-10-09 DIAGNOSIS — D649 Anemia, unspecified: Secondary | ICD-10-CM | POA: Insufficient documentation

## 2012-10-09 LAB — CBC
Hemoglobin: 11.6 g/dL — ABNORMAL LOW (ref 13.0–17.0)
MCH: 25.8 pg — ABNORMAL LOW (ref 26.0–34.0)
MCHC: 33.9 g/dL (ref 30.0–36.0)
Platelets: 203 10*3/uL (ref 150–400)

## 2012-10-09 LAB — DIFFERENTIAL
Basophils Absolute: 0 10*3/uL (ref 0.0–0.1)
Basophils Relative: 0 % (ref 0–1)
Eosinophils Absolute: 0.1 10*3/uL (ref 0.0–0.7)
Monocytes Relative: 6 % (ref 3–12)
Neutro Abs: 4.5 10*3/uL (ref 1.7–7.7)
Neutrophils Relative %: 75 % (ref 43–77)

## 2012-10-09 LAB — RENAL FUNCTION PANEL
Albumin: 3.7 g/dL (ref 3.5–5.2)
CO2: 22 mEq/L (ref 19–32)
Calcium: 9.7 mg/dL (ref 8.4–10.5)
Chloride: 104 mEq/L (ref 96–112)
GFR calc Af Amer: 42 mL/min — ABNORMAL LOW (ref >90)
GFR calc non Af Amer: 36 mL/min — ABNORMAL LOW (ref >90)
Sodium: 137 mEq/L (ref 135–145)

## 2012-10-09 MED ORDER — DARBEPOETIN ALFA-POLYSORBATE 200 MCG/0.4ML IJ SOLN
200.0000 ug | INTRAMUSCULAR | Status: DC
  Administered 2012-10-09: 200 ug via SUBCUTANEOUS

## 2012-10-09 MED ORDER — DARBEPOETIN ALFA-POLYSORBATE 200 MCG/0.4ML IJ SOLN
INTRAMUSCULAR | Status: AC
  Filled 2012-10-09: qty 0.4

## 2012-10-11 LAB — TACROLIMUS LEVEL: Tacrolimus (FK506) - LabCorp: 4.2 ng/mL

## 2012-11-06 ENCOUNTER — Encounter (HOSPITAL_COMMUNITY): Payer: Medicare Other

## 2012-11-13 ENCOUNTER — Encounter (HOSPITAL_COMMUNITY)
Admission: RE | Admit: 2012-11-13 | Discharge: 2012-11-13 | Disposition: A | Discharge status: Home / Self Care | Payer: Medicare Other | Source: Ambulatory Visit | Attending: Nephrology | Admitting: Nephrology

## 2012-11-13 DIAGNOSIS — D649 Anemia, unspecified: Secondary | ICD-10-CM | POA: Insufficient documentation

## 2012-11-13 DIAGNOSIS — N182 Chronic kidney disease, stage 2 (mild): Secondary | ICD-10-CM | POA: Insufficient documentation

## 2012-11-13 DIAGNOSIS — N2581 Secondary hyperparathyroidism of renal origin: Secondary | ICD-10-CM | POA: Insufficient documentation

## 2012-11-13 LAB — IRON AND TIBC
Iron: 105 ug/dL (ref 42–135)
Saturation Ratios: 50 % (ref 20–55)
UIBC: 107 ug/dL — ABNORMAL LOW (ref 125–400)

## 2012-11-13 LAB — RENAL FUNCTION PANEL
Albumin: 3.8 g/dL (ref 3.5–5.2)
Calcium: 9.3 mg/dL (ref 8.4–10.5)
GFR calc Af Amer: 39 mL/min — ABNORMAL LOW (ref >90)
GFR calc non Af Amer: 34 mL/min — ABNORMAL LOW (ref >90)
Glucose, Bld: 190 mg/dL — ABNORMAL HIGH (ref 70–99)
Phosphorus: 3.6 mg/dL (ref 2.3–4.6)
Potassium: 4.8 mEq/L (ref 3.5–5.1)
Sodium: 137 mEq/L (ref 135–145)

## 2012-11-13 LAB — DIFFERENTIAL
Basophils Absolute: 0 10*3/uL (ref 0.0–0.1)
Basophils Relative: 0 % (ref 0–1)
Lymphocytes Relative: 18 % (ref 12–46)
Monocytes Absolute: 0.3 10*3/uL (ref 0.1–1.0)
Neutro Abs: 4.9 10*3/uL (ref 1.7–7.7)
Neutrophils Relative %: 75 % (ref 43–77)

## 2012-11-13 LAB — CBC
MCHC: 35.3 g/dL (ref 30.0–36.0)
Platelets: 178 10*3/uL (ref 150–400)
RDW: 14.4 % (ref 11.5–15.5)
WBC: 6.5 10*3/uL (ref 4.0–10.5)

## 2012-11-13 MED ORDER — DARBEPOETIN ALFA-POLYSORBATE 200 MCG/0.4ML IJ SOLN: 200.0000 ug | INTRAMUSCULAR | Status: DC

## 2012-11-15 LAB — TACROLIMUS LEVEL: Tacrolimus (FK506) - LabCorp: 6 ng/mL

## 2012-11-27 ENCOUNTER — Encounter (HOSPITAL_COMMUNITY)
Admission: RE | Admit: 2012-11-27 | Discharge: 2012-11-27 | Disposition: A | Discharge status: Home / Self Care | Payer: Medicare Other | Source: Ambulatory Visit | Attending: Nephrology | Admitting: Nephrology

## 2012-11-27 LAB — POCT HEMOGLOBIN-HEMACUE: Hemoglobin: 12.7 g/dL — ABNORMAL LOW (ref 13.0–17.0)

## 2012-11-27 MED ORDER — DARBEPOETIN ALFA-POLYSORBATE 200 MCG/0.4ML IJ SOLN: 200.0000 ug | INTRAMUSCULAR | Status: DC

## 2012-12-08 ENCOUNTER — Ambulatory Visit: Payer: Self-pay | Admitting: Diagnostic Neuroimaging

## 2012-12-10 ENCOUNTER — Other Ambulatory Visit (HOSPITAL_COMMUNITY): Payer: Self-pay | Admitting: *Deleted

## 2012-12-11 ENCOUNTER — Encounter (HOSPITAL_COMMUNITY)
Admission: RE | Admit: 2012-12-11 | Discharge: 2012-12-11 | Disposition: A | Discharge status: Home / Self Care | Payer: Medicare Other | Source: Ambulatory Visit | Attending: Nephrology | Admitting: Nephrology

## 2012-12-11 DIAGNOSIS — N2581 Secondary hyperparathyroidism of renal origin: Secondary | ICD-10-CM | POA: Insufficient documentation

## 2012-12-11 DIAGNOSIS — D649 Anemia, unspecified: Secondary | ICD-10-CM | POA: Insufficient documentation

## 2012-12-11 DIAGNOSIS — N182 Chronic kidney disease, stage 2 (mild): Secondary | ICD-10-CM | POA: Insufficient documentation

## 2012-12-11 LAB — DIFFERENTIAL
Basophils Absolute: 0 10*3/uL (ref 0.0–0.1)
Lymphocytes Relative: 13 % (ref 12–46)
Lymphs Abs: 1.3 10*3/uL (ref 0.7–4.0)
Monocytes Absolute: 0.3 10*3/uL (ref 0.1–1.0)
Monocytes Relative: 3 % (ref 3–12)
Neutro Abs: 8.2 10*3/uL — ABNORMAL HIGH (ref 1.7–7.7)

## 2012-12-11 LAB — CBC
HCT: 33.3 % — ABNORMAL LOW (ref 39.0–52.0)
Hemoglobin: 12 g/dL — ABNORMAL LOW (ref 13.0–17.0)
MCV: 75 fL — ABNORMAL LOW (ref 78.0–100.0)
RBC: 4.44 MIL/uL (ref 4.22–5.81)
WBC: 9.9 10*3/uL (ref 4.0–10.5)

## 2012-12-11 LAB — RENAL FUNCTION PANEL
BUN: 46 mg/dL — ABNORMAL HIGH (ref 6–23)
CO2: 24 mEq/L (ref 19–32)
GFR calc Af Amer: 47 mL/min — ABNORMAL LOW (ref >90)
Glucose, Bld: 170 mg/dL — ABNORMAL HIGH (ref 70–99)
Potassium: 5.1 mEq/L (ref 3.5–5.1)
Sodium: 136 mEq/L (ref 135–145)

## 2012-12-11 MED ORDER — DARBEPOETIN ALFA-POLYSORBATE 150 MCG/0.3ML IJ SOLN: 150.0000 ug | INTRAMUSCULAR | Status: DC

## 2012-12-17 ENCOUNTER — Other Ambulatory Visit: Payer: Self-pay | Admitting: Geriatric Medicine

## 2012-12-24 ENCOUNTER — Other Ambulatory Visit (HOSPITAL_COMMUNITY): Payer: Self-pay | Admitting: *Deleted

## 2012-12-25 ENCOUNTER — Encounter (HOSPITAL_COMMUNITY)
Admission: RE | Admit: 2012-12-25 | Discharge: 2012-12-25 | Disposition: A | Discharge status: Home / Self Care | Payer: Medicare Other | Source: Ambulatory Visit | Attending: Nephrology | Admitting: Nephrology

## 2012-12-25 LAB — COMPREHENSIVE METABOLIC PANEL
ALT: 16 U/L (ref 0–53)
Alkaline Phosphatase: 81 U/L (ref 39–117)
CO2: 20 mEq/L (ref 19–32)
Chloride: 102 mEq/L (ref 96–112)
GFR calc Af Amer: 43 mL/min — ABNORMAL LOW (ref >90)
Glucose, Bld: 192 mg/dL — ABNORMAL HIGH (ref 70–99)
Potassium: 5 mEq/L (ref 3.5–5.1)
Sodium: 137 mEq/L (ref 135–145)
Total Bilirubin: 0.3 mg/dL (ref 0.3–1.2)
Total Protein: 7.6 g/dL (ref 6.0–8.3)

## 2012-12-25 MED ORDER — DARBEPOETIN ALFA-POLYSORBATE 150 MCG/0.3ML IJ SOLN: 150.0000 ug | INTRAMUSCULAR | Status: DC

## 2012-12-26 LAB — TACROLIMUS LEVEL: Tacrolimus (FK506) - LabCorp: 4.9 ng/mL

## 2012-12-26 LAB — PTH, INTACT AND CALCIUM
Calcium, Total (PTH): 9.2 mg/dL (ref 8.4–10.5)
PTH: 25.4 pg/mL (ref 14.0–72.0)

## 2013-01-08 ENCOUNTER — Encounter (HOSPITAL_COMMUNITY)
Admission: RE | Admit: 2013-01-08 | Discharge: 2013-01-08 | Disposition: A | Discharge status: Home / Self Care | Payer: Medicare Other | Source: Ambulatory Visit | Attending: Nephrology | Admitting: Nephrology

## 2013-01-08 DIAGNOSIS — N182 Chronic kidney disease, stage 2 (mild): Secondary | ICD-10-CM | POA: Insufficient documentation

## 2013-01-08 DIAGNOSIS — N2581 Secondary hyperparathyroidism of renal origin: Secondary | ICD-10-CM | POA: Insufficient documentation

## 2013-01-08 DIAGNOSIS — D649 Anemia, unspecified: Secondary | ICD-10-CM | POA: Insufficient documentation

## 2013-01-08 LAB — DIFFERENTIAL
Lymphs Abs: 1.1 10*3/uL (ref 0.7–4.0)
Monocytes Relative: 5 % (ref 3–12)
Neutro Abs: 5.5 10*3/uL (ref 1.7–7.7)
Neutrophils Relative %: 78 % — ABNORMAL HIGH (ref 43–77)

## 2013-01-08 LAB — IRON AND TIBC
Saturation Ratios: 39 % (ref 20–55)
TIBC: 200 ug/dL — ABNORMAL LOW (ref 215–435)
UIBC: 123 ug/dL — ABNORMAL LOW (ref 125–400)

## 2013-01-08 LAB — CBC
Hemoglobin: 10.6 g/dL — ABNORMAL LOW (ref 13.0–17.0)
RBC: 4.03 MIL/uL — ABNORMAL LOW (ref 4.22–5.81)

## 2013-01-08 LAB — RENAL FUNCTION PANEL
CO2: 22 mEq/L (ref 19–32)
Chloride: 107 mEq/L (ref 96–112)
Creatinine, Ser: 1.63 mg/dL — ABNORMAL HIGH (ref 0.50–1.35)
GFR calc Af Amer: 45 mL/min — ABNORMAL LOW (ref >90)
GFR calc non Af Amer: 39 mL/min — ABNORMAL LOW (ref >90)
Sodium: 138 mEq/L (ref 135–145)

## 2013-01-08 MED ORDER — DARBEPOETIN ALFA-POLYSORBATE 150 MCG/0.3ML IJ SOLN
INTRAMUSCULAR | Status: AC
  Filled 2013-01-08: qty 0.3

## 2013-01-08 MED ORDER — DARBEPOETIN ALFA-POLYSORBATE 150 MCG/0.3ML IJ SOLN
150.0000 ug | INTRAMUSCULAR | Status: DC
  Administered 2013-01-08: 150 ug via SUBCUTANEOUS

## 2013-01-09 LAB — TACROLIMUS LEVEL: Tacrolimus (FK506) - LabCorp: 4.1 ng/mL

## 2013-01-21 ENCOUNTER — Other Ambulatory Visit: Payer: Self-pay | Admitting: Geriatric Medicine

## 2013-02-05 ENCOUNTER — Encounter (HOSPITAL_COMMUNITY)
Admission: RE | Admit: 2013-02-05 | Discharge: 2013-02-05 | Disposition: A | Discharge status: Home / Self Care | Payer: Medicare Other | Source: Ambulatory Visit | Attending: Nephrology | Admitting: Nephrology

## 2013-02-05 DIAGNOSIS — D649 Anemia, unspecified: Secondary | ICD-10-CM | POA: Diagnosis present

## 2013-02-05 DIAGNOSIS — N2581 Secondary hyperparathyroidism of renal origin: Secondary | ICD-10-CM | POA: Diagnosis not present

## 2013-02-05 DIAGNOSIS — N182 Chronic kidney disease, stage 2 (mild): Secondary | ICD-10-CM | POA: Diagnosis not present

## 2013-02-05 LAB — DIFFERENTIAL
Eosinophils Absolute: 0.2 10*3/uL (ref 0.0–0.7)
Lymphocytes Relative: 16 % (ref 12–46)
Lymphs Abs: 1.3 10*3/uL (ref 0.7–4.0)
Neutro Abs: 6 10*3/uL (ref 1.7–7.7)
Neutrophils Relative %: 77 % (ref 43–77)

## 2013-02-05 LAB — RENAL FUNCTION PANEL
Albumin: 3.8 g/dL (ref 3.5–5.2)
CO2: 22 mEq/L (ref 19–32)
Chloride: 106 mEq/L (ref 96–112)
GFR calc Af Amer: 44 mL/min — ABNORMAL LOW (ref >90)
GFR calc non Af Amer: 38 mL/min — ABNORMAL LOW (ref >90)
Potassium: 4.8 mEq/L (ref 3.5–5.1)

## 2013-02-05 LAB — CBC
Hemoglobin: 10.2 g/dL — ABNORMAL LOW (ref 13.0–17.0)
Platelets: 193 10*3/uL (ref 150–400)
RBC: 3.81 MIL/uL — ABNORMAL LOW (ref 4.22–5.81)
WBC: 7.7 10*3/uL (ref 4.0–10.5)

## 2013-02-05 MED ORDER — DARBEPOETIN ALFA-POLYSORBATE 150 MCG/0.3ML IJ SOLN
INTRAMUSCULAR | Status: AC
  Filled 2013-02-05: qty 0.3

## 2013-02-05 MED ORDER — DARBEPOETIN ALFA-POLYSORBATE 150 MCG/0.3ML IJ SOLN
150.0000 ug | INTRAMUSCULAR | Status: DC
  Administered 2013-02-05: 150 ug via SUBCUTANEOUS

## 2013-02-07 LAB — TACROLIMUS LEVEL: Tacrolimus (FK506) - LabCorp: 4.8 ng/mL

## 2013-02-10 LAB — CMV (CYTOMEGALOVIRUS) DNA ULTRAQUANT, PCR: CMV DNA Quant: <200 copies/mL (ref <200)

## 2013-03-05 ENCOUNTER — Encounter (HOSPITAL_COMMUNITY)
Admission: RE | Admit: 2013-03-05 | Discharge: 2013-03-05 | Disposition: A | Discharge status: Home / Self Care | Payer: Medicare Other | Source: Ambulatory Visit | Attending: Nephrology | Admitting: Nephrology

## 2013-03-05 DIAGNOSIS — D649 Anemia, unspecified: Secondary | ICD-10-CM | POA: Diagnosis not present

## 2013-03-05 LAB — POCT HEMOGLOBIN-HEMACUE: Hemoglobin: 12 g/dL — ABNORMAL LOW (ref 13.0–17.0)

## 2013-03-05 MED ORDER — DARBEPOETIN ALFA-POLYSORBATE 150 MCG/0.3ML IJ SOLN: 150.0000 ug | INTRAMUSCULAR | Status: DC

## 2013-03-25 ENCOUNTER — Other Ambulatory Visit: Payer: Self-pay

## 2013-03-29 IMAGING — CR DG CHEST 1V
1 series · 1 of 1 positions shown · non-contrast
Comparison: 09/18/2010.

CLINICAL DATA: Cough.

CHEST - 1 VIEW

[view not recorded]
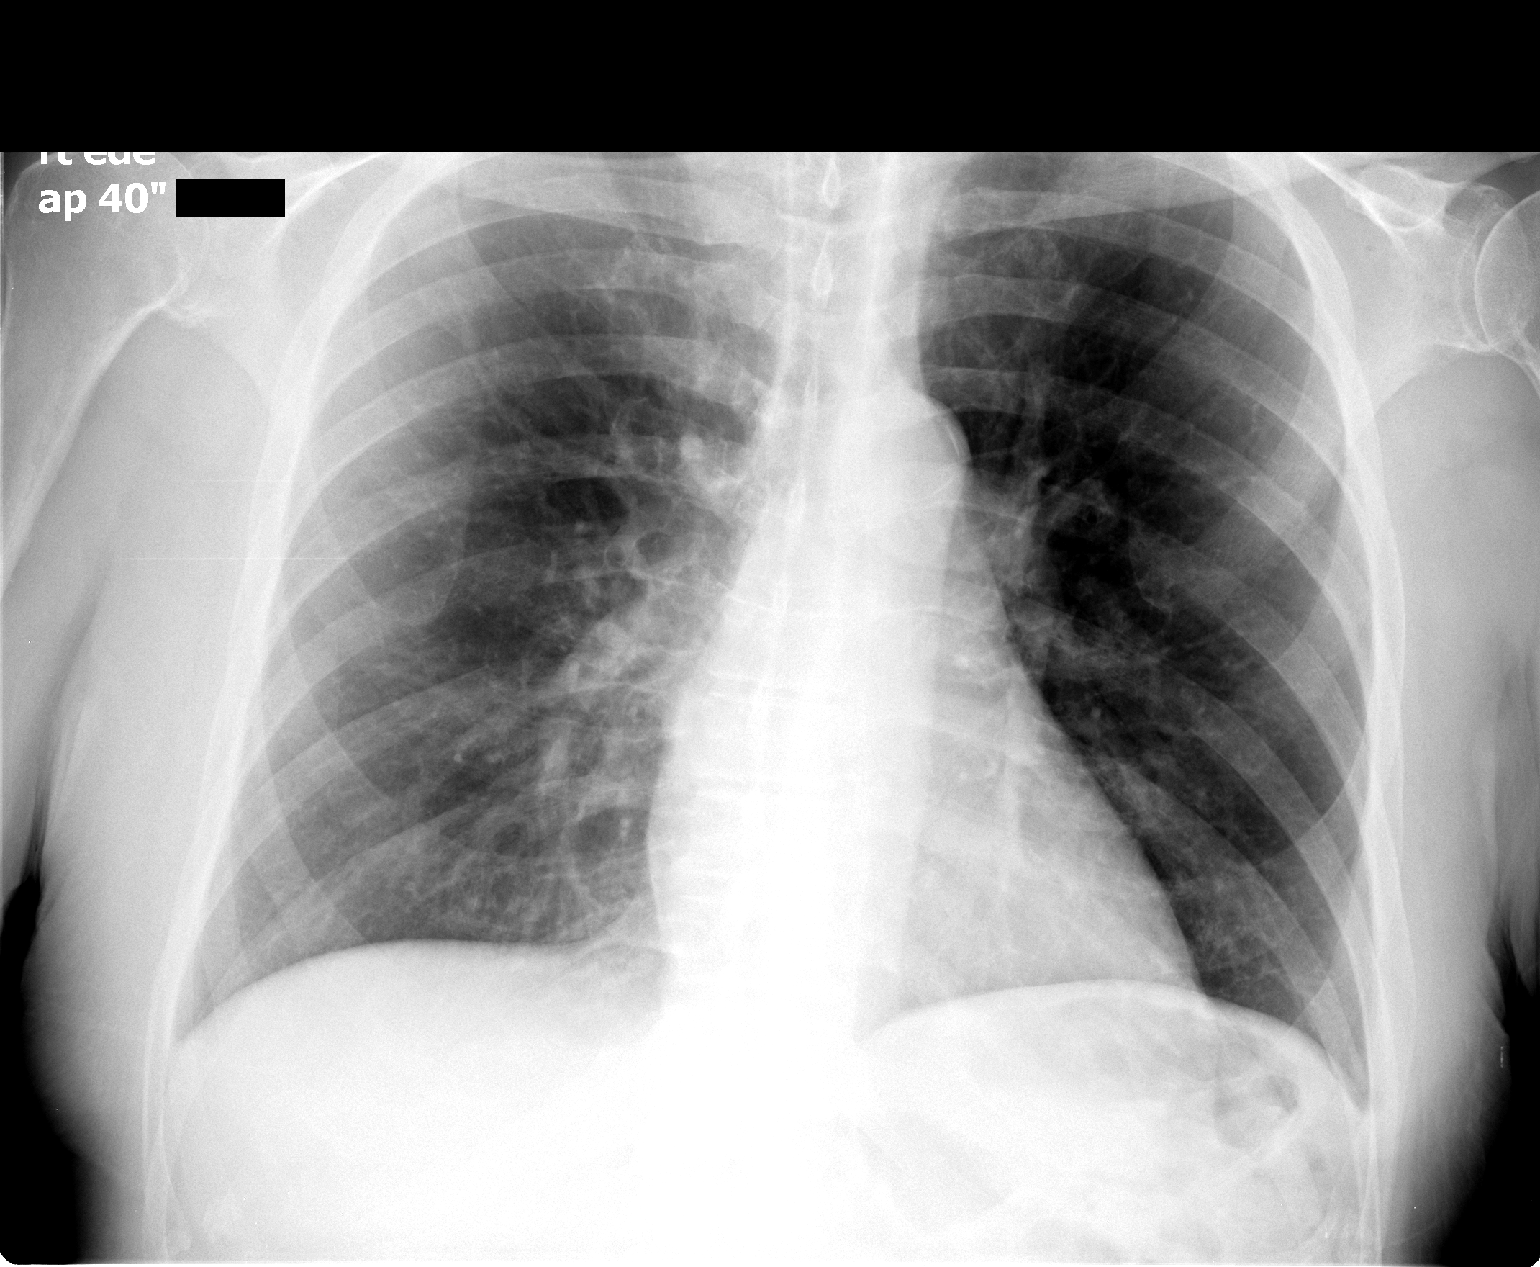

[1 of 1 positions shown; findings below may reference images not displayed]

FINDINGS: Trachea is midline.  Heart size normal.  Lungs are clear.
No pleural fluid.
IMPRESSION: No acute findings.

## 2013-04-01 ENCOUNTER — Other Ambulatory Visit (HOSPITAL_COMMUNITY): Payer: Self-pay | Admitting: *Deleted

## 2013-04-02 ENCOUNTER — Encounter (HOSPITAL_COMMUNITY)
Admission: RE | Admit: 2013-04-02 | Discharge: 2013-04-02 | Disposition: A | Discharge status: Home / Self Care | Payer: Medicare Other | Source: Ambulatory Visit | Attending: Nephrology | Admitting: Nephrology

## 2013-04-02 DIAGNOSIS — D649 Anemia, unspecified: Secondary | ICD-10-CM | POA: Diagnosis not present

## 2013-04-02 DIAGNOSIS — N182 Chronic kidney disease, stage 2 (mild): Secondary | ICD-10-CM | POA: Insufficient documentation

## 2013-04-02 DIAGNOSIS — N2581 Secondary hyperparathyroidism of renal origin: Secondary | ICD-10-CM | POA: Insufficient documentation

## 2013-04-02 LAB — DIFFERENTIAL
Basophils Absolute: 0 10*3/uL (ref 0.0–0.1)
Basophils Relative: 0 % (ref 0–1)
Eosinophils Relative: 2 % (ref 0–5)
Monocytes Absolute: 0.3 10*3/uL (ref 0.1–1.0)
Neutro Abs: 5.8 10*3/uL (ref 1.7–7.7)

## 2013-04-02 LAB — RENAL FUNCTION PANEL
Albumin: 3.9 g/dL (ref 3.5–5.2)
Calcium: 9.2 mg/dL (ref 8.4–10.5)
GFR calc Af Amer: 50 mL/min — ABNORMAL LOW (ref >90)
Glucose, Bld: 224 mg/dL — ABNORMAL HIGH (ref 70–99)
Phosphorus: 3.4 mg/dL (ref 2.3–4.6)
Sodium: 137 mEq/L (ref 135–145)

## 2013-04-02 LAB — CBC
HCT: 31.6 % — ABNORMAL LOW (ref 39.0–52.0)
MCHC: 35.1 g/dL (ref 30.0–36.0)
RDW: 13.2 % (ref 11.5–15.5)

## 2013-04-02 MED ORDER — DARBEPOETIN ALFA-POLYSORBATE 150 MCG/0.3ML IJ SOLN: 150.0000 ug | INTRAMUSCULAR | Status: DC

## 2013-04-03 LAB — TACROLIMUS LEVEL: Tacrolimus (FK506) - LabCorp: 8.3 ng/mL

## 2013-04-16 ENCOUNTER — Encounter (HOSPITAL_COMMUNITY)
Admission: RE | Admit: 2013-04-16 | Discharge: 2013-04-16 | Disposition: A | Discharge status: Home / Self Care | Payer: No Typology Code available for payment source | Source: Ambulatory Visit | Attending: Nephrology | Admitting: Nephrology

## 2013-04-16 DIAGNOSIS — N2581 Secondary hyperparathyroidism of renal origin: Secondary | ICD-10-CM | POA: Insufficient documentation

## 2013-04-16 DIAGNOSIS — N182 Chronic kidney disease, stage 2 (mild): Secondary | ICD-10-CM | POA: Insufficient documentation

## 2013-04-16 DIAGNOSIS — D649 Anemia, unspecified: Secondary | ICD-10-CM | POA: Insufficient documentation

## 2013-04-16 LAB — IRON AND TIBC: Iron: 97 ug/dL (ref 42–135)

## 2013-04-16 MED ORDER — DARBEPOETIN ALFA-POLYSORBATE 150 MCG/0.3ML IJ SOLN: 150.0000 ug | INTRAMUSCULAR | Status: DC

## 2013-04-29 ENCOUNTER — Other Ambulatory Visit (HOSPITAL_COMMUNITY): Payer: Self-pay | Admitting: *Deleted

## 2013-04-30 ENCOUNTER — Encounter (HOSPITAL_COMMUNITY)
Admission: RE | Admit: 2013-04-30 | Discharge: 2013-04-30 | Disposition: A | Discharge status: Home / Self Care | Payer: No Typology Code available for payment source | Source: Ambulatory Visit | Attending: Nephrology | Admitting: Nephrology

## 2013-04-30 LAB — CBC
HCT: 31.3 % — ABNORMAL LOW (ref 39.0–52.0)
Hemoglobin: 10.7 g/dL — ABNORMAL LOW (ref 13.0–17.0)
RDW: 13.6 % (ref 11.5–15.5)
WBC: 5.8 10*3/uL (ref 4.0–10.5)

## 2013-04-30 LAB — RENAL FUNCTION PANEL
CO2: 22 mEq/L (ref 19–32)
GFR calc Af Amer: 46 mL/min — ABNORMAL LOW (ref >90)
Glucose, Bld: 209 mg/dL — ABNORMAL HIGH (ref 70–99)
Phosphorus: 3.7 mg/dL (ref 2.3–4.6)
Potassium: 4.8 mEq/L (ref 3.5–5.1)
Sodium: 135 mEq/L (ref 135–145)

## 2013-04-30 LAB — DIFFERENTIAL
Basophils Absolute: 0 10*3/uL (ref 0.0–0.1)
Lymphocytes Relative: 20 % (ref 12–46)
Monocytes Absolute: 0.3 10*3/uL (ref 0.1–1.0)
Neutro Abs: 4.2 10*3/uL (ref 1.7–7.7)
Neutrophils Relative %: 72 % (ref 43–77)

## 2013-04-30 MED ORDER — DARBEPOETIN ALFA-POLYSORBATE 150 MCG/0.3ML IJ SOLN
INTRAMUSCULAR | Status: AC
  Administered 2013-04-30: 150 ug via SUBCUTANEOUS
  Filled 2013-04-30: qty 0.3

## 2013-04-30 MED ORDER — DARBEPOETIN ALFA-POLYSORBATE 150 MCG/0.3ML IJ SOLN: 150.0000 ug | INTRAMUSCULAR | Status: DC

## 2013-05-12 ENCOUNTER — Other Ambulatory Visit: Payer: Self-pay

## 2013-05-28 ENCOUNTER — Encounter (HOSPITAL_COMMUNITY)
Admission: RE | Admit: 2013-05-28 | Discharge: 2013-05-28 | Disposition: A | Discharge status: Home / Self Care | Payer: Medicare Other | Source: Ambulatory Visit | Attending: Nephrology | Admitting: Nephrology

## 2013-05-28 DIAGNOSIS — N182 Chronic kidney disease, stage 2 (mild): Secondary | ICD-10-CM | POA: Diagnosis not present

## 2013-05-28 DIAGNOSIS — D649 Anemia, unspecified: Secondary | ICD-10-CM | POA: Insufficient documentation

## 2013-05-28 DIAGNOSIS — N2581 Secondary hyperparathyroidism of renal origin: Secondary | ICD-10-CM | POA: Diagnosis not present

## 2013-05-28 LAB — CBC
MCV: 80.4 fL (ref 78.0–100.0)
Platelets: 177 10*3/uL (ref 150–400)
RBC: 5.04 MIL/uL (ref 4.22–5.81)
RDW: 15 % (ref 11.5–15.5)
WBC: 6.2 10*3/uL (ref 4.0–10.5)

## 2013-05-28 LAB — DIFFERENTIAL
Basophils Absolute: 0 10*3/uL (ref 0.0–0.1)
Lymphocytes Relative: 16 % (ref 12–46)
Lymphs Abs: 1 10*3/uL (ref 0.7–4.0)
Neutro Abs: 4.9 10*3/uL (ref 1.7–7.7)
Neutrophils Relative %: 78 % — ABNORMAL HIGH (ref 43–77)

## 2013-05-28 LAB — RENAL FUNCTION PANEL
Albumin: 4.3 g/dL (ref 3.5–5.2)
BUN: 42 mg/dL — ABNORMAL HIGH (ref 6–23)
Chloride: 100 mEq/L (ref 96–112)
GFR calc Af Amer: 51 mL/min — ABNORMAL LOW (ref >90)
GFR calc non Af Amer: 44 mL/min — ABNORMAL LOW (ref >90)
Potassium: 5.7 mEq/L — ABNORMAL HIGH (ref 3.5–5.1)
Sodium: 135 mEq/L (ref 135–145)

## 2013-05-28 MED ORDER — DARBEPOETIN ALFA-POLYSORBATE 150 MCG/0.3ML IJ SOLN: 150.0000 ug | INTRAMUSCULAR | Status: DC

## 2013-05-29 LAB — IRON AND TIBC
Iron: 88 ug/dL (ref 42–135)
Saturation Ratios: 43 % (ref 20–55)
TIBC: 205 ug/dL — ABNORMAL LOW (ref 215–435)
UIBC: 117 ug/dL — ABNORMAL LOW (ref 125–400)

## 2013-05-29 LAB — FERRITIN: Ferritin: 794 ng/mL — ABNORMAL HIGH (ref 22–322)

## 2013-06-24 ENCOUNTER — Other Ambulatory Visit: Payer: Self-pay | Admitting: Internal Medicine

## 2013-06-24 ENCOUNTER — Ambulatory Visit (INDEPENDENT_AMBULATORY_CARE_PROVIDER_SITE_OTHER): Payer: Medicare Other | Admitting: Podiatry

## 2013-06-24 ENCOUNTER — Other Ambulatory Visit (HOSPITAL_COMMUNITY): Payer: Self-pay | Admitting: *Deleted

## 2013-06-24 ENCOUNTER — Ambulatory Visit (HOSPITAL_COMMUNITY)
Admission: RE | Admit: 2013-06-24 | Discharge: 2013-06-24 | Disposition: A | Discharge status: Home / Self Care | Payer: No Typology Code available for payment source | Source: Ambulatory Visit | Attending: Internal Medicine | Admitting: Internal Medicine

## 2013-06-24 ENCOUNTER — Encounter: Payer: Self-pay | Admitting: Podiatry

## 2013-06-24 VITALS — BP 150/77 | HR 67 | Resp 16 | Ht 70.0 in | Wt 185.0 lb

## 2013-06-24 DIAGNOSIS — M4040 Postural lordosis, site unspecified: Secondary | ICD-10-CM | POA: Insufficient documentation

## 2013-06-24 DIAGNOSIS — R109 Unspecified abdominal pain: Secondary | ICD-10-CM | POA: Diagnosis not present

## 2013-06-24 DIAGNOSIS — B351 Tinea unguium: Secondary | ICD-10-CM

## 2013-06-24 DIAGNOSIS — IMO0002 Reserved for concepts with insufficient information to code with codable children: Secondary | ICD-10-CM | POA: Insufficient documentation

## 2013-06-24 DIAGNOSIS — M898X9 Other specified disorders of bone, unspecified site: Secondary | ICD-10-CM | POA: Diagnosis not present

## 2013-06-24 DIAGNOSIS — M19019 Primary osteoarthritis, unspecified shoulder: Secondary | ICD-10-CM | POA: Diagnosis not present

## 2013-06-24 DIAGNOSIS — M4000 Postural kyphosis, site unspecified: Secondary | ICD-10-CM | POA: Insufficient documentation

## 2013-06-24 DIAGNOSIS — M25519 Pain in unspecified shoulder: Secondary | ICD-10-CM | POA: Diagnosis not present

## 2013-06-24 DIAGNOSIS — M542 Cervicalgia: Secondary | ICD-10-CM | POA: Diagnosis present

## 2013-06-24 DIAGNOSIS — M79609 Pain in unspecified limb: Secondary | ICD-10-CM

## 2013-06-24 DIAGNOSIS — R52 Pain, unspecified: Secondary | ICD-10-CM

## 2013-06-24 NOTE — Progress Notes (Signed)
  Subjective:    Patient ID: Evan [name], male    DOB: 07-11-1932, 79 y.o.   MRN: [id number] "Check my feet, look for any places that may have blisters.  My big toe on my right foot has been bothering me."  Diabetes He presents for his initial diabetic visit. He has type 2 diabetes mellitus. The initial diagnosis of diabetes was made 37 years ago. His disease course has been fluctuating. (Painful Hallux Rt.) Symptoms are stable. Symptoms have been present for 2 months. Current diabetic treatment includes insulin injections and oral agent (monotherapy). He is compliant with treatment all of the time. His weight is stable. He is following a diabetic diet. Exercise: physical therapy twice a week for 6 weeks. His home blood glucose trend is fluctuating minimally. He sees a podiatrist.   He matches intermittent pain in the right great toe when he tissue on the right foot and occasional discomfort in the right hallux with direct pressure. He also requests diabetic shoes.  Review of Systems     Objective:   Physical Exam  79 year old black male presents with care giver. The right hallux demonstrates no erythema, edema, warmth or any skin lesions. The right hallux interphalangeal joint is is rigid and mildly prominent. All 10 nails are hypertrophic deformed, discolored, brittle and tender to palpation.        Assessment & Plan:   Assessment: Arthritic right hallux interphalangeal joint with intermittent symptoms. Symptomatic onychomycoses x10  Plan: Patient is advised that there is no evidence of any acute problems in the right hallux. Avoidance of firm shoes recommended. All 10 toenails debrided back without any bleeding.  Will get medical confirmation from treating doctor diabetes for diabetic shoes for the indication of diabetic neuropathy and deformity, hallux interphalangeus right

## 2013-06-25 ENCOUNTER — Encounter (HOSPITAL_COMMUNITY): Payer: Medicare Other

## 2013-07-15 ENCOUNTER — Other Ambulatory Visit (HOSPITAL_COMMUNITY): Payer: Self-pay | Admitting: *Deleted

## 2013-07-16 ENCOUNTER — Encounter (HOSPITAL_COMMUNITY): Payer: Medicare Other

## 2013-07-17 ENCOUNTER — Emergency Department (HOSPITAL_COMMUNITY)
Admission: EM | Admit: 2013-07-17 | Discharge: 2013-07-17 | Disposition: A | Discharge status: Home / Self Care | Payer: Medicare Other | Source: Home / Self Care | Attending: Emergency Medicine | Admitting: Emergency Medicine

## 2013-07-17 ENCOUNTER — Emergency Department (HOSPITAL_COMMUNITY): Payer: Medicare Other

## 2013-07-17 ENCOUNTER — Encounter (HOSPITAL_COMMUNITY): Payer: Self-pay | Admitting: Radiology

## 2013-07-17 DIAGNOSIS — Z794 Long term (current) use of insulin: Secondary | ICD-10-CM | POA: Insufficient documentation

## 2013-07-17 DIAGNOSIS — I120 Hypertensive chronic kidney disease with stage 5 chronic kidney disease or end stage renal disease: Secondary | ICD-10-CM | POA: Insufficient documentation

## 2013-07-17 DIAGNOSIS — Z8546 Personal history of malignant neoplasm of prostate: Secondary | ICD-10-CM | POA: Insufficient documentation

## 2013-07-17 DIAGNOSIS — Z8739 Personal history of other diseases of the musculoskeletal system and connective tissue: Secondary | ICD-10-CM | POA: Insufficient documentation

## 2013-07-17 DIAGNOSIS — G8929 Other chronic pain: Secondary | ICD-10-CM | POA: Insufficient documentation

## 2013-07-17 DIAGNOSIS — Z79899 Other long term (current) drug therapy: Secondary | ICD-10-CM | POA: Insufficient documentation

## 2013-07-17 DIAGNOSIS — Z87891 Personal history of nicotine dependence: Secondary | ICD-10-CM | POA: Insufficient documentation

## 2013-07-17 DIAGNOSIS — R443 Hallucinations, unspecified: Secondary | ICD-10-CM

## 2013-07-17 DIAGNOSIS — Z940 Kidney transplant status: Secondary | ICD-10-CM | POA: Insufficient documentation

## 2013-07-17 DIAGNOSIS — R059 Cough, unspecified: Secondary | ICD-10-CM | POA: Insufficient documentation

## 2013-07-17 DIAGNOSIS — N186 End stage renal disease: Secondary | ICD-10-CM | POA: Insufficient documentation

## 2013-07-17 DIAGNOSIS — M549 Dorsalgia, unspecified: Secondary | ICD-10-CM | POA: Insufficient documentation

## 2013-07-17 DIAGNOSIS — Z8701 Personal history of pneumonia (recurrent): Secondary | ICD-10-CM | POA: Insufficient documentation

## 2013-07-17 DIAGNOSIS — E119 Type 2 diabetes mellitus without complications: Secondary | ICD-10-CM | POA: Insufficient documentation

## 2013-07-17 DIAGNOSIS — Z87440 Personal history of urinary (tract) infections: Secondary | ICD-10-CM | POA: Insufficient documentation

## 2013-07-17 DIAGNOSIS — N189 Chronic kidney disease, unspecified: Secondary | ICD-10-CM

## 2013-07-17 DIAGNOSIS — H538 Other visual disturbances: Secondary | ICD-10-CM | POA: Insufficient documentation

## 2013-07-17 DIAGNOSIS — Z8673 Personal history of transient ischemic attack (TIA), and cerebral infarction without residual deficits: Secondary | ICD-10-CM | POA: Insufficient documentation

## 2013-07-17 DIAGNOSIS — I4891 Unspecified atrial fibrillation: Secondary | ICD-10-CM | POA: Insufficient documentation

## 2013-07-17 DIAGNOSIS — Z7902 Long term (current) use of antithrombotics/antiplatelets: Secondary | ICD-10-CM | POA: Insufficient documentation

## 2013-07-17 DIAGNOSIS — R05 Cough: Secondary | ICD-10-CM | POA: Insufficient documentation

## 2013-07-17 DIAGNOSIS — Z992 Dependence on renal dialysis: Secondary | ICD-10-CM | POA: Insufficient documentation

## 2013-07-17 DIAGNOSIS — K219 Gastro-esophageal reflux disease without esophagitis: Secondary | ICD-10-CM | POA: Insufficient documentation

## 2013-07-17 DIAGNOSIS — R011 Cardiac murmur, unspecified: Secondary | ICD-10-CM | POA: Insufficient documentation

## 2013-07-17 LAB — URINALYSIS, ROUTINE W REFLEX MICROSCOPIC
Glucose, UA: NEGATIVE mg/dL
Hgb urine dipstick: NEGATIVE
Ketones, ur: NEGATIVE mg/dL
Leukocytes, UA: NEGATIVE
Protein, ur: NEGATIVE mg/dL
Urobilinogen, UA: 0.2 mg/dL (ref 0.0–1.0)
pH: 7 (ref 5.0–8.0)

## 2013-07-17 LAB — COMPREHENSIVE METABOLIC PANEL
AST: 25 U/L (ref 0–37)
Albumin: 4.2 g/dL (ref 3.5–5.2)
Alkaline Phosphatase: 86 U/L (ref 39–117)
BUN: 50 mg/dL — ABNORMAL HIGH (ref 6–23)
Creatinine, Ser: 1.85 mg/dL — ABNORMAL HIGH (ref 0.50–1.35)
GFR calc Af Amer: 38 mL/min — ABNORMAL LOW (ref >90)
Potassium: 4.9 mEq/L (ref 3.5–5.1)
Total Protein: 8.4 g/dL — ABNORMAL HIGH (ref 6.0–8.3)

## 2013-07-17 LAB — CBC
HCT: 33.8 % — ABNORMAL LOW (ref 39.0–52.0)
Hemoglobin: 11.1 g/dL — ABNORMAL LOW (ref 13.0–17.0)
MCH: 26.7 pg (ref 26.0–34.0)
MCHC: 32.8 g/dL (ref 30.0–36.0)
MCV: 81.4 fL (ref 78.0–100.0)
Platelets: 219 K/uL (ref 150–400)
RBC: 4.15 MIL/uL — ABNORMAL LOW (ref 4.22–5.81)
RDW: 13.7 % (ref 11.5–15.5)
WBC: 5 K/uL (ref 4.0–10.5)

## 2013-07-17 LAB — GLUCOSE, CAPILLARY: Glucose-Capillary: 139 mg/dL — ABNORMAL HIGH (ref 70–99)

## 2013-07-17 NOTE — ED Provider Notes (Signed)
CSN: [id number]     Arrival date & time 07/17/13  1058 History   First MD Initiated Contact with Patient 07/17/13 1117     Chief Complaint  Patient presents with  . Hallucinations    diarrhea   (Consider location/radiation/quality/duration/timing/severity/associated sxs/prior Treatment) HPI Comments: 79 yo male with complicated medical hx including kidney transplant on prograf, stroke, dm, htn, past smoker, pneumonia presents with worsening hallucinations the past two days.  Pt has had mild intermittent visual hallucinations for a few months however worsened the past two days. Pt has home care and family support.  Recently had decreased prograf dose to help with tremors.  The past few weeks he has been taking tylenol PM more frequently.  No fevers.  UTI hx.  Intermittent sxs.   The history is provided by the patient, a relative and the spouse.    Past Medical History  Diagnosis Date  . H/O kidney transplant     End stage renal disease  . Diabetes mellitus   . Cancer     hx prostate cancer  . ESRD (end stage renal disease) 2011    hemodialysis started Sept 2011, cadaveric (double) renal  transplant Mar 2012 at [facility]  . Stroke   . GERD (gastroesophageal reflux disease)   . Hypertension   . Dysrhythmia     hx of atrial fibrilation  . Shortness of breath     " sometimes "  . Pneumonia 04/08/2012  . Heart murmur   . Arthritis     hands  . UTI (urinary tract infection)    Past Surgical History  Procedure Laterality Date  . Kidney transplant    . Prostate surgery  approx. 2003    seed implant   Family History  Problem Relation Age of Onset  . Colon cancer Mother    History  Substance Use Topics  . Smoking status: Former Smoker -- 15 years    Types: Cigarettes    Quit date: 01/24/1983  . Smokeless tobacco: Never Used  . Alcohol Use: No    Review of Systems  Constitutional: Negative for fever, chills and appetite change.  HENT: Negative for congestion.   Eyes: Positive  for visual disturbance (chronic).  Respiratory: Positive for cough. Negative for shortness of breath.   Cardiovascular: Negative for chest pain.  Gastrointestinal: Negative for vomiting and abdominal pain.  Genitourinary: Negative for dysuria and flank pain.  Musculoskeletal: Positive for arthralgias and back pain (chronic). Negative for neck pain and neck stiffness.  Skin: Negative for rash.  Neurological: Negative for light-headedness and headaches.  Psychiatric/Behavioral: Positive for hallucinations.    Allergies  Review of patient's allergies indicates no known allergies.  Home Medications   Current Outpatient Rx  Name  Route  Sig  Dispense  Refill  . baclofen (LIORESAL) 20 MG tablet   Oral   Take 20 mg by mouth 2 (two) times daily as needed. For hiccups         . calcium carbonate (TUMS EX) 750 MG chewable tablet   Oral   Chew 1 tablet by mouth 3 (three) times daily.         . clopidogrel (PLAVIX) 75 MG tablet   Oral   Take 75 mg by mouth daily.         Marland Kitchen diltiazem (TIAZAC) 300 MG 24 hr capsule   Oral   Take 300 mg by mouth daily.          . diphenhydramine-acetaminophen (TYLENOL PM EXTRA STRENGTH) 25-500  MG TABS   Oral   Take 2 tablets by mouth at bedtime.         . furosemide (LASIX) 20 MG tablet   Oral   Take 10 mg by mouth daily.         Marland Kitchen glipiZIDE (GLUCOTROL) 10 MG tablet   Oral   Take 10 mg by mouth 2 (two) times daily before a meal.         . hydrALAZINE (APRESOLINE) 25 MG tablet   Oral   Take 25 mg by mouth 2 (two) times daily.         . insulin aspart (NOVOLOG) 100 UNIT/ML injection   Subcutaneous   Inject 5-10 Units into the skin 3 (three) times daily before meals. cbg 150-300 5 units; 301-400 8 units; 401-500 10 units         . insulin glargine (LANTUS) 100 UNIT/ML injection   Subcutaneous   Inject 20 Units into the skin at bedtime. CBG Over 200 5 units   10 mL      . loratadine (CLARITIN) 10 MG tablet   Oral   Take 10  mg by mouth daily as needed. allergies         . megestrol (MEGACE) 400 MG/10ML suspension   Oral   Take 400 mg by mouth daily as needed. For appetite         . mycophenolate (MYFORTIC) 180 MG EC tablet   Oral   Take 180 mg by mouth 2 (two) times daily.         . pantoprazole (PROTONIX) 40 MG tablet   Oral   Take 40 mg by mouth daily.         . polyethylene glycol (MIRALAX / GLYCOLAX) packet   Oral   Take 17 g by mouth daily as needed. constipation         . sodium bicarbonate 650 MG tablet   Oral   Take 1,300 mg by mouth 2 (two) times daily.         . tacrolimus (PROGRAF) 5 MG capsule   Oral   Take 5 mg by mouth 2 (two) times daily.          BP 151/70  Pulse 88  Temp(Src) 97.9 F (36.6 C) (Oral)  Resp 16  SpO2 100% Physical Exam  Nursing note and vitals reviewed. Constitutional: He is oriented to person, place, and time. He appears well-developed and well-nourished.  HENT:  Head: Normocephalic and atraumatic.  Dry mm  Eyes: Right eye exhibits no discharge. Left eye exhibits no discharge.  Neck: Normal range of motion. Neck supple. No tracheal deviation present.  Cardiovascular: Normal rate and regular rhythm.   Murmur (4+ upper sternum SM) heard. Pulmonary/Chest: Effort normal and breath sounds normal.  Abdominal: Soft. He exhibits no distension. There is no tenderness. There is no guarding.  Musculoskeletal: He exhibits no edema.  Neurological: He is alert and oriented to person, place, and time. No cranial nerve deficit.  General weakness, frail  Skin: Skin is warm. No rash noted.  Psychiatric: He has a normal mood and affect.    ED Course  Procedures (including critical care time) Labs Review Labs Reviewed  GLUCOSE, CAPILLARY - Abnormal; Notable for the following:    Glucose-Capillary 139 (*)    All other components within normal limits  CBC - Abnormal; Notable for the following:    RBC 4.15 (*)    Hemoglobin 11.1 (*)    HCT 33.8 (*)  All other components within normal limits  COMPREHENSIVE METABOLIC PANEL - Abnormal; Notable for the following:    Glucose, Bld 148 (*)    BUN 50 (*)    Creatinine, Ser 1.85 (*)    Total Protein 8.4 (*)    GFR calc non Af Amer 33 (*)    GFR calc Af Amer 38 (*)    All other components within normal limits  URINALYSIS, ROUTINE W REFLEX MICROSCOPIC   Imaging Review Dg Chest 2 View  07/17/2013   CLINICAL DATA:  07/17/2013  EXAM: CHEST  2 VIEW  COMPARISON:  04/08/2012  FINDINGS: Low lung volumes. The heart size and mediastinal contours are within normal limits. Both lungs are clear. The visualized skeletal structures are unremarkable.  IMPRESSION: No active cardiopulmonary disease.   Electronically Signed   By: Salome Holmes M.D.   On: 07/17/2013 12:46   Ct Head Wo Contrast (only If Suspected Head Trauma And/or Pt Is On Anticoagulant)  07/17/2013   CLINICAL DATA:  Hallucinations.  Prior strokes.  EXAM: CT HEAD WITHOUT CONTRAST  TECHNIQUE: Contiguous axial images were obtained from the base of the skull through the vertex without intravenous contrast.  COMPARISON:  CT scan dated 03/25/2012  FINDINGS: There is no acute intracranial hemorrhage, infarction, or mass lesion. There are old strokes in the pons. Slight diffuse atrophy. No osseous abnormality.  IMPRESSION: No acute intracranial abnormality.  Old pontine infarcts.   Electronically Signed   By: Geanie Cooley M.D.   On: 07/17/2013 12:32    EKG Interpretation   None     EKG not working in MUSE  Date: 07/17/2013  Rate: 85  Rhythm: normal sinus rhythm  QRS Axis: left  Intervals: PR prolonged and QT prolonged  ST/T Wave abnormalities: nonspecific ST changes LVH  Conduction Disutrbances:first-degree A-V block   Narrative Interpretation: No acute findings   MDM   1. Hallucinations   2.  CRF Concern for medication SE vs UTI vs other infection vs stroke vs other. Likely benadryl related/ tylenol pm. No focal stroke findings.   Pt  at baseline in ED. No other reason found on workup.  CT no acute findings, ua okay.  Discussed holding tylenol pm and benadryl.  Results and differential diagnosis were discussed with the patient. Close follow up outpatient was discussed, patient comfortable with the plan.       Enid Skeens, MD 07/17/13 [phone]

## 2013-07-17 NOTE — ED Notes (Signed)
Pt family states patient has had hallucinations for the past year at night off and on. Pt hallucinations have started running into daytime hours. Pt has had diarrhea from Sunday to Wednesday this week. Pt is alert and stating that he sees 2 dogs in the house that are doctors. Pt wife called MD this morning and he said to come to ed and have sugar checked. Pt has had 2 strokes in the past effecting his speech and weakness on the right side. Pt appetite is fair and taking fluids. Pt has pressure ulcer on sacrum that is being treated by home health care.

## 2013-07-18 ENCOUNTER — Encounter (HOSPITAL_COMMUNITY): Payer: Self-pay | Admitting: Emergency Medicine

## 2013-07-18 ENCOUNTER — Inpatient Hospital Stay (HOSPITAL_COMMUNITY)
Admission: EM | Admit: 2013-07-18 | Discharge: 2013-07-22 | DRG: 682 | Disposition: A | Discharge status: Home Health | Payer: Medicare Other | Attending: Internal Medicine | Admitting: Internal Medicine

## 2013-07-18 DIAGNOSIS — Z8719 Personal history of other diseases of the digestive system: Secondary | ICD-10-CM

## 2013-07-18 DIAGNOSIS — E1165 Type 2 diabetes mellitus with hyperglycemia: Secondary | ICD-10-CM | POA: Diagnosis present

## 2013-07-18 DIAGNOSIS — E1129 Type 2 diabetes mellitus with other diabetic kidney complication: Secondary | ICD-10-CM

## 2013-07-18 DIAGNOSIS — G9340 Encephalopathy, unspecified: Secondary | ICD-10-CM

## 2013-07-18 DIAGNOSIS — E119 Type 2 diabetes mellitus without complications: Secondary | ICD-10-CM

## 2013-07-18 DIAGNOSIS — R627 Adult failure to thrive: Secondary | ICD-10-CM

## 2013-07-18 DIAGNOSIS — L8992 Pressure ulcer of unspecified site, stage 2: Secondary | ICD-10-CM | POA: Diagnosis present

## 2013-07-18 DIAGNOSIS — I69959 Hemiplegia and hemiparesis following unspecified cerebrovascular disease affecting unspecified side: Secondary | ICD-10-CM

## 2013-07-18 DIAGNOSIS — M179 Osteoarthritis of knee, unspecified: Secondary | ICD-10-CM

## 2013-07-18 DIAGNOSIS — I639 Cerebral infarction, unspecified: Secondary | ICD-10-CM

## 2013-07-18 DIAGNOSIS — I5033 Acute on chronic diastolic (congestive) heart failure: Secondary | ICD-10-CM | POA: Diagnosis not present

## 2013-07-18 DIAGNOSIS — N058 Unspecified nephritic syndrome with other morphologic changes: Secondary | ICD-10-CM | POA: Diagnosis present

## 2013-07-18 DIAGNOSIS — Z8546 Personal history of malignant neoplasm of prostate: Secondary | ICD-10-CM

## 2013-07-18 DIAGNOSIS — M129 Arthropathy, unspecified: Secondary | ICD-10-CM | POA: Diagnosis present

## 2013-07-18 DIAGNOSIS — G9341 Metabolic encephalopathy: Secondary | ICD-10-CM

## 2013-07-18 DIAGNOSIS — Z8673 Personal history of transient ischemic attack (TIA), and cerebral infarction without residual deficits: Secondary | ICD-10-CM

## 2013-07-18 DIAGNOSIS — IMO0002 Reserved for concepts with insufficient information to code with codable children: Secondary | ICD-10-CM

## 2013-07-18 DIAGNOSIS — Z79899 Other long term (current) drug therapy: Secondary | ICD-10-CM

## 2013-07-18 DIAGNOSIS — Z992 Dependence on renal dialysis: Secondary | ICD-10-CM

## 2013-07-18 DIAGNOSIS — Z800 Family history of malignant neoplasm of digestive organs: Secondary | ICD-10-CM

## 2013-07-18 DIAGNOSIS — M1710 Unilateral primary osteoarthritis, unspecified knee: Secondary | ICD-10-CM

## 2013-07-18 DIAGNOSIS — N186 End stage renal disease: Secondary | ICD-10-CM | POA: Diagnosis present

## 2013-07-18 DIAGNOSIS — L89109 Pressure ulcer of unspecified part of back, unspecified stage: Secondary | ICD-10-CM | POA: Diagnosis present

## 2013-07-18 DIAGNOSIS — J189 Pneumonia, unspecified organism: Secondary | ICD-10-CM

## 2013-07-18 DIAGNOSIS — E860 Dehydration: Secondary | ICD-10-CM

## 2013-07-18 DIAGNOSIS — C61 Malignant neoplasm of prostate: Secondary | ICD-10-CM

## 2013-07-18 DIAGNOSIS — Z8679 Personal history of other diseases of the circulatory system: Secondary | ICD-10-CM

## 2013-07-18 DIAGNOSIS — I4891 Unspecified atrial fibrillation: Secondary | ICD-10-CM

## 2013-07-18 DIAGNOSIS — N189 Chronic kidney disease, unspecified: Secondary | ICD-10-CM

## 2013-07-18 DIAGNOSIS — R197 Diarrhea, unspecified: Secondary | ICD-10-CM

## 2013-07-18 DIAGNOSIS — Z794 Long term (current) use of insulin: Secondary | ICD-10-CM

## 2013-07-18 DIAGNOSIS — K219 Gastro-esophageal reflux disease without esophagitis: Secondary | ICD-10-CM | POA: Diagnosis present

## 2013-07-18 DIAGNOSIS — Z940 Kidney transplant status: Secondary | ICD-10-CM

## 2013-07-18 DIAGNOSIS — N390 Urinary tract infection, site not specified: Secondary | ICD-10-CM

## 2013-07-18 DIAGNOSIS — E875 Hyperkalemia: Secondary | ICD-10-CM

## 2013-07-18 DIAGNOSIS — Z87891 Personal history of nicotine dependence: Secondary | ICD-10-CM

## 2013-07-18 DIAGNOSIS — N179 Acute kidney failure, unspecified: Principal | ICD-10-CM

## 2013-07-18 DIAGNOSIS — I350 Nonrheumatic aortic (valve) stenosis: Secondary | ICD-10-CM

## 2013-07-18 DIAGNOSIS — I120 Hypertensive chronic kidney disease with stage 5 chronic kidney disease or end stage renal disease: Secondary | ICD-10-CM | POA: Diagnosis present

## 2013-07-18 DIAGNOSIS — I5032 Chronic diastolic (congestive) heart failure: Secondary | ICD-10-CM

## 2013-07-18 LAB — CBC WITH DIFFERENTIAL/PLATELET
Basophils Absolute: 0 10*3/uL (ref 0.0–0.1)
Eosinophils Absolute: 0.1 10*3/uL (ref 0.0–0.7)
HCT: 35.8 % — ABNORMAL LOW (ref 39.0–52.0)
Hemoglobin: 12.1 g/dL — ABNORMAL LOW (ref 13.0–17.0)
Lymphocytes Relative: 15 % (ref 12–46)
MCHC: 33.8 g/dL (ref 30.0–36.0)
Monocytes Absolute: 0.5 10*3/uL (ref 0.1–1.0)
Monocytes Relative: 9 % (ref 3–12)
Neutro Abs: 4.4 10*3/uL (ref 1.7–7.7)
Neutrophils Relative %: 75 % (ref 43–77)
Platelets: 222 10*3/uL (ref 150–400)
WBC: 5.9 10*3/uL (ref 4.0–10.5)

## 2013-07-18 LAB — BASIC METABOLIC PANEL
CO2: 23 mEq/L (ref 19–32)
Chloride: 101 mEq/L (ref 96–112)
Creatinine, Ser: 2.12 mg/dL — ABNORMAL HIGH (ref 0.50–1.35)
GFR calc non Af Amer: 28 mL/min — ABNORMAL LOW (ref >90)
Potassium: 5.3 mEq/L — ABNORMAL HIGH (ref 3.5–5.1)

## 2013-07-18 LAB — AMMONIA: Ammonia: 42 umol/L (ref 11–60)

## 2013-07-18 MED ORDER — SODIUM POLYSTYRENE SULFONATE 15 GM/60ML PO SUSP
30.0000 g | Freq: Once | ORAL | Status: DC
  Filled 2013-07-18: qty 120

## 2013-07-18 MED ORDER — SODIUM CHLORIDE 0.9 % IV BOLUS (SEPSIS)
500.0000 mL | Freq: Once | INTRAVENOUS | Status: AC
  Administered 2013-07-18: 500 mL via INTRAVENOUS

## 2013-07-18 MED ORDER — PANTOPRAZOLE SODIUM 40 MG IV SOLR
40.0000 mg | Freq: Every day | INTRAVENOUS | Status: DC
  Administered 2013-07-19 (×2): 40 mg via INTRAVENOUS
  Filled 2013-07-18 (×3): qty 40

## 2013-07-18 MED ORDER — ALBUTEROL SULFATE (5 MG/ML) 0.5% IN NEBU: 2.5000 mg | INHALATION_SOLUTION | RESPIRATORY_TRACT | Status: DC | PRN

## 2013-07-18 MED ORDER — ACETAMINOPHEN 325 MG PO TABS: 650.0000 mg | ORAL_TABLET | Freq: Four times a day (QID) | ORAL | Status: DC | PRN

## 2013-07-18 MED ORDER — SODIUM CHLORIDE 0.9 % IJ SOLN
3.0000 mL | Freq: Two times a day (BID) | INTRAMUSCULAR | Status: DC
  Administered 2013-07-19: 3 mL via INTRAVENOUS

## 2013-07-18 MED ORDER — ENOXAPARIN SODIUM 30 MG/0.3ML ~~LOC~~ SOLN
30.0000 mg | SUBCUTANEOUS | Status: DC
  Administered 2013-07-19 (×2): 30 mg via SUBCUTANEOUS
  Filled 2013-07-18 (×3): qty 0.3

## 2013-07-18 MED ORDER — MYCOPHENOLATE SODIUM 180 MG PO TBEC
180.0000 mg | DELAYED_RELEASE_TABLET | Freq: Two times a day (BID) | ORAL | Status: DC
  Filled 2013-07-18: qty 1

## 2013-07-18 MED ORDER — ONDANSETRON HCL 4 MG/2ML IJ SOLN: 4.0000 mg | Freq: Four times a day (QID) | INTRAMUSCULAR | Status: DC | PRN

## 2013-07-18 MED ORDER — ACETAMINOPHEN 650 MG RE SUPP: 650.0000 mg | Freq: Four times a day (QID) | RECTAL | Status: DC | PRN

## 2013-07-18 MED ORDER — SODIUM CHLORIDE 0.9 % IV SOLN
INTRAVENOUS | Status: DC
  Administered 2013-07-19 (×2): via INTRAVENOUS

## 2013-07-18 MED ORDER — BIOTENE DRY MOUTH MT LIQD
15.0000 mL | Freq: Two times a day (BID) | OROMUCOSAL | Status: DC
  Administered 2013-07-20 – 2013-07-22 (×5): 15 mL via OROMUCOSAL

## 2013-07-18 MED ORDER — SODIUM BICARBONATE 650 MG PO TABS
1300.0000 mg | ORAL_TABLET | Freq: Two times a day (BID) | ORAL | Status: DC
  Filled 2013-07-18: qty 2

## 2013-07-18 MED ORDER — METOPROLOL TARTRATE 1 MG/ML IV SOLN: 2.5000 mg | Freq: Four times a day (QID) | INTRAVENOUS | Status: DC | PRN

## 2013-07-18 MED ORDER — MYCOPHENOLATE SODIUM 180 MG PO TBEC
180.0000 mg | DELAYED_RELEASE_TABLET | Freq: Once | ORAL | Status: DC
  Filled 2013-07-18: qty 1

## 2013-07-18 MED ORDER — TACROLIMUS 1 MG PO CAPS
4.0000 mg | ORAL_CAPSULE | Freq: Once | ORAL | Status: DC
  Filled 2013-07-18: qty 4

## 2013-07-18 MED ORDER — INSULIN ASPART 100 UNIT/ML ~~LOC~~ SOLN
0.0000 [IU] | SUBCUTANEOUS | Status: DC
  Administered 2013-07-19: 2 [IU] via SUBCUTANEOUS
  Administered 2013-07-19 – 2013-07-20 (×3): 1 [IU] via SUBCUTANEOUS
  Administered 2013-07-20: 2 [IU] via SUBCUTANEOUS
  Administered 2013-07-21: 17:00:00 1 [IU] via SUBCUTANEOUS
  Administered 2013-07-21: 3 [IU] via SUBCUTANEOUS
  Administered 2013-07-21: 21:00:00 2 [IU] via SUBCUTANEOUS
  Administered 2013-07-22 (×3): 1 [IU] via SUBCUTANEOUS

## 2013-07-18 MED ORDER — CHLORHEXIDINE GLUCONATE 0.12 % MT SOLN
15.0000 mL | Freq: Two times a day (BID) | OROMUCOSAL | Status: DC
  Administered 2013-07-19 – 2013-07-22 (×7): 15 mL via OROMUCOSAL
  Filled 2013-07-18 (×10): qty 15

## 2013-07-18 MED ORDER — ONDANSETRON HCL 4 MG PO TABS: 4.0000 mg | ORAL_TABLET | Freq: Four times a day (QID) | ORAL | Status: DC | PRN

## 2013-07-18 MED ORDER — CALCIUM CARBONATE ANTACID 750 MG PO CHEW: 1.0000 | CHEWABLE_TABLET | Freq: Three times a day (TID) | ORAL | Status: DC

## 2013-07-18 NOTE — ED Notes (Signed)
Attempted to call floor for report, receiving Nurse will call back once available.

## 2013-07-18 NOTE — ED Notes (Addendum)
Pt has shaking. Family states pt has shaking with medication for kidneys, medication decreased a few days ago. Family reports pt has been having hallucination at home that someone has been standing behind him or there are cats and dogs.

## 2013-07-18 NOTE — ED Notes (Signed)
His family tell us that over the past two days, the pt. Has become "much more weak than normal".  They further state that he is hallucinating, having "imaginary conversations" which they say are new.  Pt. Is in no distress.  His son also states pt. "has some sores on his bottom that have been there a while".  We lift him onto stretcher.

## 2013-07-18 NOTE — ED Notes (Signed)
MD at bedside. 

## 2013-07-18 NOTE — H&P (Addendum)
[facility]  History and Physical  [name] ZOX:[id number] DOB: 17-Aug-1931 DOA: 07/18/2013  Referring physician: EDP PCP: Evan Slimmer, MD  Outpatient Specialists:  1. Nephrology: Dr. Casimiro Needle  Chief Complaint: Altered mental status, diarrhea  HPI: Evan [name] is a 79 y.o. male with extensive PMH-ESRD, previously on hemodialysis, status post renal transplant on dual immunosuppressants-Prograf and mycophenolate, type II DM, CVA with residual right hemiparesis, hypertension, atrial fibrillation, GERD, presented to the ED with complaints of worsening altered mental status and diarrhea. Patient unable to provide any history secondary to mental status changes. History is obtained from patient's spouse and son at bedside. They gave history of one-2 months of intermittent hallucinations which have lately gotten worse in the last 1 week. He has visual hallucinations where he sees and talks to people, sees animals and things. Apparently his Prograf dose was recently decreased secondary to tremors. He apparently stays awake all night due to the uncomfortable bed but sleeps in the daytime on his recliner. He also is said to be getting Tylenol PM frequently. Family gives approximately 5 days history of diarrhea 2-3 times per day. Oral intake has apparently decreased. He said to be on a regular consistency diet at home. He has a chronic ulcer on his sacrum which is unchanged and is clean. No history of fever, chills, nausea, vomiting, chest pain, cough, dyspnea. He has complained of some shoulder pains and abdominal pains and the x-rays of his shoulders apparently have shown arthritis. He was seen in the ED yesterday and his mental status changes were felt to be secondary to medication side effect and patient was discharged home with advice to hold Tylenol PM and Benadryl. In the ED, potassium 5.3, creatinine 2.12 which is elevated from baseline of 1.4-1.5, hemoglobin 12.1, chest x-ray and  CT head from 07/17/13 without acute findings and urinalysis without features for UTI. Hospitalist admission requested.    Review of Systems: All systems reviewed and apart from history of presenting illness, are negative.  Past Medical History  Diagnosis Date  . H/O kidney transplant     End stage renal disease  . Diabetes mellitus   . Cancer     hx prostate cancer  . ESRD (end stage renal disease) 2011    hemodialysis started Sept 2011, cadaveric (double) renal  transplant Mar 2012 at [facility]  . Stroke   . GERD (gastroesophageal reflux disease)   . Hypertension   . Dysrhythmia     hx of atrial fibrilation  . Shortness of breath     " sometimes "  . Pneumonia 04/08/2012  . Heart murmur   . Arthritis     hands  . UTI (urinary tract infection)    Past Surgical History  Procedure Laterality Date  . Kidney transplant    . Prostate surgery  approx. 2003    seed implant   Social History:  reports that he quit smoking about 30 years ago. His smoking use included Cigarettes. He smoked 0.00 packs per day for 15 years. He has never used smokeless tobacco. He reports that he does not drink alcohol or use illicit drugs.  married. Dependent on activities of daily living.  No Known Allergies  Family History  Problem Relation Age of Onset  . Colon cancer Mother     Prior to Admission medications   Medication Sig Start Date End Date Taking? Authorizing Provider  baclofen (LIORESAL) 20 MG tablet Take 20 mg by mouth at bedtime. For hiccups  Yes Historical Provider, MD  calcium carbonate (TUMS EX) 750 MG chewable tablet Chew 1 tablet by mouth 3 (three) times daily.   Yes Historical Provider, MD  diltiazem (TIAZAC) 300 MG 24 hr capsule Take 300 mg by mouth daily.    Yes Historical Provider, MD  diphenhydramine-acetaminophen (TYLENOL PM EXTRA STRENGTH) 25-500 MG TABS Take 2 tablets by mouth at bedtime as needed (sleep).    Yes Historical Provider, MD  furosemide (LASIX) 20 MG tablet Take 20  mg by mouth daily.    Yes Historical Provider, MD  glipiZIDE (GLUCOTROL) 10 MG tablet Take 10 mg by mouth 2 (two) times daily before a meal.   Yes Historical Provider, MD  guaiFENesin (MUCINEX) 600 MG 12 hr tablet Take 600 mg by mouth 2 (two) times daily as needed for cough.   Yes Historical Provider, MD  insulin aspart (NOVOLOG) 100 UNIT/ML injection Inject 5-10 Units into the skin 3 (three) times daily before meals. cbg 150-300 5 units; 301-400 8 units; 401-500 10 units   Yes Historical Provider, MD  loratadine (CLARITIN) 10 MG tablet Take 10 mg by mouth daily as needed. allergies   Yes Historical Provider, MD  megestrol (MEGACE) 400 MG/10ML suspension Take 400 mg by mouth daily as needed. For appetite   Yes Historical Provider, MD  mycophenolate (MYFORTIC) 180 MG EC tablet Take 180 mg by mouth 2 (two) times daily.   Yes Historical Provider, MD  omeprazole (PRILOSEC) 40 MG capsule Take 40 mg by mouth 2 (two) times daily.   Yes Historical Provider, MD  polyethylene glycol (MIRALAX / GLYCOLAX) packet Take 17 g by mouth daily as needed. constipation   Yes Historical Provider, MD  sodium bicarbonate 650 MG tablet Take 1,300 mg by mouth 2 (two) times daily.   Yes Historical Provider, MD  tacrolimus (PROGRAF) 1 MG capsule Take 4 mg by mouth 2 (two) times daily.   Yes Historical Provider, MD   Physical Exam: Filed Vitals:   07/18/13 1631 07/18/13 2012 07/18/13 2054  BP: 154/77 166/54 165/54  Pulse: 96 96 88  Temp: 98.8 F (37.1 C) 98.9 F (37.2 C) 98.7 F (37.1 C)  TempSrc: Oral Axillary Axillary  Resp: 16 17 17   Height:   5\' 11"  (1.803 m)  Weight:   81.647 kg (180 lb)  SpO2: 100% 100% 99%     General exam: Moderately built and nourished  chronically ill-looking male patient, lying comfortably supine on the gurney in no obvious distress with his head tilted to right side (apparently worse than his chronic) . not fully cooperative with physical exam.  Head, eyes and ENT: Nontraumatic and  normocephalic. Pupils equally reacting to light and accommodation. Oral mucosa  dry .  Neck:  next seems contracted to the right side. No JVD, carotid bruit or thyromegaly.  Lymphatics: No lymphadenopathy.  Respiratory system:  poor inspiratory effort but seems clear to auscultation . No increased work of breathing.  Cardiovascular system: S1 and S2 heard, RRR. No JVD, gallops, clicks or pedal edema. Grade 3/5 systolic murmur best heard at apex but radiates to rest of anterior chest.  Gastrointestinal system: Abdomen is nondistended, soft and nontender. Normal bowel sounds heard. No organomegaly or masses appreciated.  Central nervous system:  drowsy but arousable and follows a few instructions. Nonverbal and will not open eyes. No focal neurological deficits.  Extremities: Symmetric  3 x 3  power at least . Peripheral pulses symmetrically felt.   Skin:  stage II clean mid sacral ulcer  with dressing on top.   Musculoskeletal system: Negative exam.  Psychiatry: Pleasant and cooperative.   Labs on Admission:  Basic Metabolic Panel:  Recent Labs Lab 07/17/13 1150 07/18/13 1835  NA 135 137  K 4.9 5.3*  CL 99 101  CO2 22 23  GLUCOSE 148* 173*  BUN 50* 56*  CREATININE 1.85* 2.12*  CALCIUM 9.5 9.3   Liver Function Tests:  Recent Labs Lab 07/17/13 1150  AST 25  ALT 36  ALKPHOS 86  BILITOT 0.5  PROT 8.4*  ALBUMIN 4.2   No results found for this basename: LIPASE, AMYLASE,  in the last 168 hours No results found for this basename: AMMONIA,  in the last 168 hours CBC:  Recent Labs Lab 07/17/13 1150 07/18/13 1835  WBC 5.0 5.9  NEUTROABS  --  4.4  HGB 11.1* 12.1*  HCT 33.8* 35.8*  MCV 81.4 80.8  PLT 219 222   Cardiac Enzymes: No results found for this basename: CKTOTAL, CKMB, CKMBINDEX, TROPONINI,  in the last 168 hours  BNP (last 3 results) No results found for this basename: PROBNP,  in the last 8760 hours CBG:  Recent Labs Lab 07/17/13 1127  GLUCAP  139*    Radiological Exams on Admission: Dg Chest 2 View  07/17/2013   CLINICAL DATA:  07/17/2013  EXAM: CHEST  2 VIEW  COMPARISON:  04/08/2012  FINDINGS: Low lung volumes. The heart size and mediastinal contours are within normal limits. Both lungs are clear. The visualized skeletal structures are unremarkable.  IMPRESSION: No active cardiopulmonary disease.   Electronically Signed   By: Salome Holmes M.D.   On: 07/17/2013 12:46   Ct Head Wo Contrast (only If Suspected Head Trauma And/or Pt Is On Anticoagulant)  07/17/2013   CLINICAL DATA:  Hallucinations.  Prior strokes.  EXAM: CT HEAD WITHOUT CONTRAST  TECHNIQUE: Contiguous axial images were obtained from the base of the skull through the vertex without intravenous contrast.  COMPARISON:  CT scan dated 03/25/2012  FINDINGS: There is no acute intracranial hemorrhage, infarction, or mass lesion. There are old strokes in the pons. Slight diffuse atrophy. No osseous abnormality.  IMPRESSION: No acute intracranial abnormality.  Old pontine infarcts.   Electronically Signed   By: Geanie Cooley M.D.   On: 07/17/2013 12:32    EKG: Independently reviewed. Sinus rhythm at 91 beats per minute, normal axis T wave inversions in lateral leads without acute changes and no features of hyperkalemia.   Assessment/Plan Principal Problem:   Acute encephalopathy Active Problems:   History of CVA (cerebrovascular accident)/left pontine infarct June 20,2013   History of renal transplant   CVA (cerebral infarction)   Dehydration   Hyperkalemia   FTT (failure to thrive) in adult   Chronic diastolic heart failure, NYHA class 2   Atrial fibrillation   Acute on chronic renal failure   DM (diabetes mellitus) type II uncontrolled with renal manifestation   Acute encephalopathy/visual hallucinations  - Likely metabolic in nature from dehydration, acute on chronic kidney disease and possible medication related.  - CT head 12/12 without acute findings.  - No  clinical focus of obvious infection.  - Admit to telemetry. Check ammonia levels and Prograf levels.  - N.p.o. until patient consistently awake and alert to safely take by mouth medications.  - In the interim continue IV fluids.  - Monitor closely. If does not improve within the next 12-24 hours, may consider further workup-? MRI brain   Dehydration - Secondary to diarrhea and poor  oral intake - IV fluids  Diarrhea - Check stool for C. difficile PCR and GI pathogen panel PCR - Monitor. No antimicrobials at this time.  Acute on stage III chronic kidney disease/mild hyperkalemia - Secondary to dehydration. IV fluid hydration and follow BMP closely. - Follow BMP in a.m. after a dose of Kayexalate. - Due to mental status changes, n.p.o.-both immunosuppressants are on hold. According to family, patient apparently is still taking both. These are to be resumed once he is awake and alert. Please discuss with patient's primary nephrologist regarding immunosuppressant management.  CVA with residual right hemiparesis - PT and OT evaluation.  Type II DM with renal complications - Sensitive SSI.  History of atrial fibrillation - Currently in sinus rhythm. Unable to take oral medications. When necessary IV metoprolol for RVR.     Code Status: Full   Family Communication: Discussed with son and spouse at bedside   Disposition Plan: To be determined    Time spent: 70 minutes   Khristina Janota, MD, FACP, FHM. [facility] Pager [phone]  If 7PM-7AM, please contact night-coverage [email or web address] Password [facility] 07/18/2013, 9:18 PM

## 2013-07-18 NOTE — ED Provider Notes (Signed)
CSN: [id number]     Arrival date & time 07/18/13  1618 History   First MD Initiated Contact with Patient 07/18/13 1626     Chief Complaint  Patient presents with  . Weakness   (Consider location/radiation/quality/duration/timing/severity/associated sxs/prior Treatment) The history is provided by the patient and the spouse. No language interpreter was used.  Evan [name] is a 79 y/o M with PMHx of ESRD leading to kidney transplant - currently on Prograf for renal transplant, leading to continuous shaking that has recently been decreased - stroke in 2012 that resulted in right sided weakness patient is currently in OT, DM, GERD, HTN, arthritis, heart murmur presenting to the ED with visual hallucinations that have been ongoing continuously throughout the week. Wife is concerned, reported that husband has been seeing people, birds, animals - as per wife, stated that patient has been calling 911 and she had to remove the phone from the patient numerous times throughout the night. Wife reported that he has had episodes of hallucinations a couple of months ago, but stated that it was only one instance, stated that this is the longest she has noticed this. Wife reported that husband has been having diarrhea. Reported that only change in medication was a decrease in Tylenol and to stop giving patient Tylenol PM. Denied chest pain, shortness of breath, difficulty breathing, difficulty swallowing, changes appetite, blurred vision, sudden loss of vision. PCP Dr. Chestine Spore  Past Medical History  Diagnosis Date  . H/O kidney transplant     End stage renal disease  . Diabetes mellitus   . Cancer     hx prostate cancer  . ESRD (end stage renal disease) 2011    hemodialysis started Sept 2011, cadaveric (double) renal  transplant Mar 2012 at [facility]  . Stroke   . GERD (gastroesophageal reflux disease)   . Hypertension   . Dysrhythmia     hx of atrial fibrilation  . Shortness of breath     " sometimes "  .  Pneumonia 04/08/2012  . Heart murmur   . Arthritis     hands  . UTI (urinary tract infection)    Past Surgical History  Procedure Laterality Date  . Kidney transplant    . Prostate surgery  approx. 2003    seed implant   Family History  Problem Relation Age of Onset  . Colon cancer Mother    History  Substance Use Topics  . Smoking status: Former Smoker -- 15 years    Types: Cigarettes    Quit date: 01/24/1983  . Smokeless tobacco: Never Used  . Alcohol Use: No    Review of Systems  Respiratory: Negative for chest tightness and shortness of breath.   Cardiovascular: Negative for chest pain.  Gastrointestinal: Positive for diarrhea. Negative for nausea, vomiting and abdominal pain.  Musculoskeletal: Negative for back pain and neck pain.  Neurological: Positive for weakness. Negative for dizziness.  Psychiatric/Behavioral: Positive for hallucinations (visual ).    Allergies  Review of patient's allergies indicates no known allergies.  Home Medications   Current Outpatient Rx  Name  Route  Sig  Dispense  Refill  . baclofen (LIORESAL) 20 MG tablet   Oral   Take 20 mg by mouth at bedtime. For hiccups         . calcium carbonate (TUMS EX) 750 MG chewable tablet   Oral   Chew 1 tablet by mouth 3 (three) times daily.         Marland Kitchen diltiazem (  TIAZAC) 300 MG 24 hr capsule   Oral   Take 300 mg by mouth daily.          . diphenhydramine-acetaminophen (TYLENOL PM EXTRA STRENGTH) 25-500 MG TABS   Oral   Take 2 tablets by mouth at bedtime as needed (sleep).          . furosemide (LASIX) 20 MG tablet   Oral   Take 20 mg by mouth daily.          Marland Kitchen glipiZIDE (GLUCOTROL) 10 MG tablet   Oral   Take 10 mg by mouth 2 (two) times daily before a meal.         . guaiFENesin (MUCINEX) 600 MG 12 hr tablet   Oral   Take 600 mg by mouth 2 (two) times daily as needed for cough.         . insulin aspart (NOVOLOG) 100 UNIT/ML injection   Subcutaneous   Inject 5-10  Units into the skin 3 (three) times daily before meals. cbg 150-300 5 units; 301-400 8 units; 401-500 10 units         . loratadine (CLARITIN) 10 MG tablet   Oral   Take 10 mg by mouth daily as needed. allergies         . megestrol (MEGACE) 400 MG/10ML suspension   Oral   Take 400 mg by mouth daily as needed. For appetite         . mycophenolate (MYFORTIC) 180 MG EC tablet   Oral   Take 180 mg by mouth 2 (two) times daily.         Marland Kitchen omeprazole (PRILOSEC) 40 MG capsule   Oral   Take 40 mg by mouth 2 (two) times daily.         . polyethylene glycol (MIRALAX / GLYCOLAX) packet   Oral   Take 17 g by mouth daily as needed. constipation         . sodium bicarbonate 650 MG tablet   Oral   Take 1,300 mg by mouth 2 (two) times daily.         . tacrolimus (PROGRAF) 1 MG capsule   Oral   Take 4 mg by mouth 2 (two) times daily.          BP 154/77  Pulse 96  Temp(Src) 98.8 F (37.1 C) (Oral)  Resp 16  SpO2 100% Physical Exam  Vitals reviewed. Constitutional: He is oriented to person, place, and time. He appears well-developed and well-nourished. No distress.  HENT:  Head: Normocephalic and atraumatic.  Mouth/Throat: No oropharyngeal exudate.  Dry mucous membranes  Eyes: Conjunctivae and EOM are normal. Pupils are equal, round, and reactive to light. Right eye exhibits no discharge. Left eye exhibits no discharge.  Neck: Normal range of motion. Neck supple.  Negative C-spine tenderness  Cardiovascular: Normal rate and regular rhythm.   Murmur heard. Pulmonary/Chest: Effort normal and breath sounds normal. No respiratory distress. He has no wheezes. He has no rales.  Abdominal: Soft. Bowel sounds are normal. There is no tenderness. There is no guarding.  Musculoskeletal: He exhibits no tenderness.  Please see neurological section   Neurological: He is alert and oriented to person, place, and time. No cranial nerve deficit.  Cranial nerves II through XII grossly  intact Decrease strength localized to the right upper and lower extremity secondary to stroke that occurred in 2012-right-sided weakness identified. Patient still able to move the right side has full function. Strength intact to left  upper and lower extremities without difficulty Negative focal neurological deficits identified Continuous shaking identified the upper extremities bilaterally  Skin: Skin is warm and dry. No rash noted. He is not diaphoretic. No erythema.    ED Course  Procedures (including critical care time)  This provider reviewed patient's chart. Patient was seen yesterday in the emergency department on 07/17/2013 regarding similar symptoms-hallucinations and weakness. EKG was performed with negative findings, patient is in normal sinus rhythm with first-degree AV block identified. CBC, CMP, urinalysis were negative findings. CMP noted elevated BUN and creatinine with BUN of 50 and a creatinine of 1.85. CT head was performed with negative findings for acute infarction, old pontine infarct are identified. Chest x-ray was performed with negative findings for acute cardio pulmonary disease. Patient was discharged yesterday. Provider recommended holding Tylenol PM and Benadryl with close followup as outpatient.  7:50 PM This provider spoke with Dr. Bennie Pierini - discussed history, case, presentation, labs and imaging - patient to be admitted to the hospital for observation Team 8 to Telemetry. As per physician recommended EKG to be performed and to administer Kayexalate.   Results for orders placed during the hospital encounter of 07/18/13  CBC WITH DIFFERENTIAL      Result Value Range   WBC 5.9  4.0 - 10.5 K/uL   RBC 4.43  4.22 - 5.81 MIL/uL   Hemoglobin 12.1 (*) 13.0 - 17.0 g/dL   HCT 78.2 (*) 95.6 - 21.3 %   MCV 80.8  78.0 - 100.0 fL   MCH 27.3  26.0 - 34.0 pg   MCHC 33.8  30.0 - 36.0 g/dL   RDW 08.6  57.8 - 46.9 %   Platelets 222  150 - 400 K/uL   Neutrophils Relative % 75   43 - 77 %   Neutro Abs 4.4  1.7 - 7.7 K/uL   Lymphocytes Relative 15  12 - 46 %   Lymphs Abs 0.9  0.7 - 4.0 K/uL   Monocytes Relative 9  3 - 12 %   Monocytes Absolute 0.5  0.1 - 1.0 K/uL   Eosinophils Relative 1  0 - 5 %   Eosinophils Absolute 0.1  0.0 - 0.7 K/uL   Basophils Relative 0  0 - 1 %   Basophils Absolute 0.0  0.0 - 0.1 K/uL  BASIC METABOLIC PANEL      Result Value Range   Sodium 137  135 - 145 mEq/L   Potassium 5.3 (*) 3.5 - 5.1 mEq/L   Chloride 101  96 - 112 mEq/L   CO2 23  19 - 32 mEq/L   Glucose, Bld 173 (*) 70 - 99 mg/dL   BUN 56 (*) 6 - 23 mg/dL   Creatinine, Ser 6.29 (*) 0.50 - 1.35 mg/dL   Calcium 9.3  8.4 - 52.8 mg/dL   GFR calc non Af Amer 28 (*) >90 mL/min   GFR calc Af Amer 32 (*) >90 mL/min   Dg Chest 2 View  07/17/2013   CLINICAL DATA:  07/17/2013  EXAM: CHEST  2 VIEW  COMPARISON:  04/08/2012  FINDINGS: Low lung volumes. The heart size and mediastinal contours are within normal limits. Both lungs are clear. The visualized skeletal structures are unremarkable.  IMPRESSION: No active cardiopulmonary disease.   Electronically Signed   By: Salome Holmes M.D.   On: 07/17/2013 12:46    Ct Head Wo Contrast (only If Suspected Head Trauma And/or Pt Is On Anticoagulant)  07/17/2013   CLINICAL DATA:  Hallucinations.  Prior strokes.  EXAM: CT HEAD WITHOUT CONTRAST  TECHNIQUE: Contiguous axial images were obtained from the base of the skull through the vertex without intravenous contrast.  COMPARISON:  CT scan dated 03/25/2012  FINDINGS: There is no acute intracranial hemorrhage, infarction, or mass lesion. There are old strokes in the pons. Slight diffuse atrophy. No osseous abnormality.  IMPRESSION: No acute intracranial abnormality.  Old pontine infarcts.   Electronically Signed   By: Geanie Cooley M.D.   On: 07/17/2013 12:32    Labs Review Labs Reviewed  CBC WITH DIFFERENTIAL - Abnormal; Notable for the following:    Hemoglobin 12.1 (*)    HCT 35.8 (*)    All  other components within normal limits  BASIC METABOLIC PANEL - Abnormal; Notable for the following:    Potassium 5.3 (*)    Glucose, Bld 173 (*)    BUN 56 (*)    Creatinine, Ser 2.12 (*)    GFR calc non Af Amer 28 (*)    GFR calc Af Amer 32 (*)    All other components within normal limits   Imaging Review Dg Chest 2 View  07/17/2013   CLINICAL DATA:  07/17/2013  EXAM: CHEST  2 VIEW  COMPARISON:  04/08/2012  FINDINGS: Low lung volumes. The heart size and mediastinal contours are within normal limits. Both lungs are clear. The visualized skeletal structures are unremarkable.  IMPRESSION: No active cardiopulmonary disease.   Electronically Signed   By: Salome Holmes M.D.   On: 07/17/2013 12:46   Ct Head Wo Contrast (only If Suspected Head Trauma And/or Pt Is On Anticoagulant)  07/17/2013   CLINICAL DATA:  Hallucinations.  Prior strokes.  EXAM: CT HEAD WITHOUT CONTRAST  TECHNIQUE: Contiguous axial images were obtained from the base of the skull through the vertex without intravenous contrast.  COMPARISON:  CT scan dated 03/25/2012  FINDINGS: There is no acute intracranial hemorrhage, infarction, or mass lesion. There are old strokes in the pons. Slight diffuse atrophy. No osseous abnormality.  IMPRESSION: No acute intracranial abnormality.  Old pontine infarcts.   Electronically Signed   By: Geanie Cooley M.D.   On: 07/17/2013 12:32    EKG Interpretation   None       MDM   1. Dehydration   2. Hyperkalemia    Medications  tacrolimus (PROGRAF) capsule 4 mg (not administered)  mycophenolate (MYFORTIC) EC tablet 180 mg (not administered)  sodium chloride 0.9 % bolus 500 mL (not administered)  sodium polystyrene (KAYEXALATE) 15 GM/60ML suspension 30 g (not administered)   Filed Vitals:   07/18/13 1631  BP: 154/77  Pulse: 96  Temp: 98.8 F (37.1 C)  TempSrc: Oral  Resp: 16  SpO2: 100%    Patient presenting to the ED with visual hallucinations and worsening weakness that has been  ongoing for the past week. Family is concerned Patient was seen in the ED yesterday, 07/17/2013, where CT head with negative findings was performed. Chest xray negative findings. CMP, CBC, and UA negative findings noted. Patient was discharged yesterday with recommendation to not take Tylenol PM and Benadryl.  Alert and oriented. Heart noted murmur of 4+. Pulses palpable, radial and DP bilaterally. Lungs clear to auscultation. Right sided weakness noted - secondary to stroke that occurred in 2012. Left upper and lower extremities full ROM noted. Shaking and tremors noted to BUE, continuous. Patient has dry mucus membranes. Strength intact, left more so than right.  CBC noted Hgb of 12.1 and Hct of 35.8. BMP  noted change in BUN and Creatinine - both elevated when compared to levels that were taken yesterday - BUN increased from 50 to 56 and Creatinine increased from 1.85 to 2.12.  Patient given fluids IV.  Discussed labs and results with family - explained plan for admission secondary to increased BUN and Cr levels over a 24 hour time span and dehydration with hallucinations - family understood and agreed. Discussed case with Dr. Bennie Pierini [facility] - patient to be admitted to Telemetry, observation for increased BUN and Cr, dehydration and hyperkalemia. Patient stable for transfer.   Raymon Mutton, PA-C 07/19/13 1450

## 2013-07-18 NOTE — ED Notes (Signed)
Dr. Waymon Amato at bedside, to hold scheduled PO meds until pt. Is fully awake to take meds., to prevent aspiration. pt. Appeared so sleepy , unable to keep eyes open longer, falls back to sleep right away.

## 2013-07-19 DIAGNOSIS — G9341 Metabolic encephalopathy: Secondary | ICD-10-CM

## 2013-07-19 DIAGNOSIS — Z940 Kidney transplant status: Secondary | ICD-10-CM

## 2013-07-19 DIAGNOSIS — E119 Type 2 diabetes mellitus without complications: Secondary | ICD-10-CM

## 2013-07-19 LAB — COMPREHENSIVE METABOLIC PANEL
ALT: 51 U/L (ref 0–53)
AST: 34 U/L (ref 0–37)
BUN: 53 mg/dL — ABNORMAL HIGH (ref 6–23)
CO2: 21 mEq/L (ref 19–32)
Calcium: 9.2 mg/dL (ref 8.4–10.5)
Chloride: 108 mEq/L (ref 96–112)
GFR calc Af Amer: 37 mL/min — ABNORMAL LOW (ref >90)
GFR calc non Af Amer: 32 mL/min — ABNORMAL LOW (ref >90)
Glucose, Bld: 82 mg/dL (ref 70–99)
Sodium: 140 mEq/L (ref 135–145)
Total Bilirubin: 0.5 mg/dL (ref 0.3–1.2)
Total Protein: 7.7 g/dL (ref 6.0–8.3)

## 2013-07-19 LAB — CBC
HCT: 34.3 % — ABNORMAL LOW (ref 39.0–52.0)
MCH: 27.1 pg (ref 26.0–34.0)
MCHC: 33.5 g/dL (ref 30.0–36.0)
Platelets: 204 10*3/uL (ref 150–400)
RDW: 13.9 % (ref 11.5–15.5)

## 2013-07-19 LAB — GLUCOSE, CAPILLARY: Glucose-Capillary: 75 mg/dL (ref 70–99)

## 2013-07-19 MED ORDER — DILTIAZEM HCL ER BEADS 300 MG PO CP24
300.0000 mg | ORAL_CAPSULE | Freq: Every day | ORAL | Status: DC
  Administered 2013-07-19 – 2013-07-22 (×4): 300 mg via ORAL
  Filled 2013-07-19 (×4): qty 1

## 2013-07-19 MED ORDER — MYCOPHENOLATE SODIUM 180 MG PO TBEC
180.0000 mg | DELAYED_RELEASE_TABLET | Freq: Two times a day (BID) | ORAL | Status: DC
  Administered 2013-07-19 – 2013-07-22 (×7): 180 mg via ORAL
  Filled 2013-07-19 (×8): qty 1

## 2013-07-19 MED ORDER — SODIUM BICARBONATE 650 MG PO TABS
1300.0000 mg | ORAL_TABLET | Freq: Two times a day (BID) | ORAL | Status: DC
  Administered 2013-07-19 – 2013-07-22 (×7): 1300 mg via ORAL
  Filled 2013-07-19 (×8): qty 2

## 2013-07-19 MED ORDER — TACROLIMUS 1 MG PO CAPS
4.0000 mg | ORAL_CAPSULE | Freq: Two times a day (BID) | ORAL | Status: DC
  Administered 2013-07-19 – 2013-07-22 (×7): 4 mg via ORAL
  Filled 2013-07-19 (×8): qty 4

## 2013-07-19 MED ORDER — PANTOPRAZOLE SODIUM 40 MG PO TBEC
40.0000 mg | DELAYED_RELEASE_TABLET | Freq: Every day | ORAL | Status: DC
  Administered 2013-07-20 – 2013-07-22 (×3): 40 mg via ORAL
  Filled 2013-07-19 (×3): qty 1

## 2013-07-19 MED ORDER — DEXTROSE 50 % IV SOLN
INTRAVENOUS | Status: AC
  Administered 2013-07-19: 13:00:00 50 mL
  Filled 2013-07-19: qty 50

## 2013-07-19 MED ORDER — DEXTROSE 50 % IV SOLN: 25.0000 mL | Freq: Once | INTRAVENOUS | Status: AC | PRN

## 2013-07-19 MED ORDER — MEGESTROL ACETATE 400 MG/10ML PO SUSP
400.0000 mg | Freq: Every day | ORAL | Status: DC
  Administered 2013-07-20 – 2013-07-22 (×3): 400 mg via ORAL
  Filled 2013-07-19 (×3): qty 10

## 2013-07-19 MED ORDER — SODIUM CHLORIDE 0.9 % IV SOLN
INTRAVENOUS | Status: DC
  Administered 2013-07-19 – 2013-07-22 (×5): via INTRAVENOUS

## 2013-07-19 NOTE — ED Provider Notes (Signed)
Medical screening examination/treatment/procedure(s) were conducted as a shared visit with non-physician practitioner(s) and myself.  I personally evaluated the patient during the encounter.  EKG Interpretation    Date/Time:  Saturday July 18 2013 16:36:42 EST Ventricular Rate:  91 PR Interval:  183 QRS Duration: 96 QT Interval:  351 QTC Calculation: 432 R Axis:   19 Text Interpretation:  Sinus rhythm Abnormal T, consider ischemia, lateral leads ST elevation, consider anterior injury No significant change since last tracing Confirmed by Khoi Hamberger  MD, Deni Berti [id number]) on 07/18/2013 8:33:18 PM            Evan [name] is a 79 y.o. male hx of dementia, ESRD s/p kidney transplant, here with visual hallucinations. Visual hallucinations for the last week. Came in yesterday. Labs showed slightly worsening renal function compared to baseline. CT head and labs including prograf level and UA and CXR normal. Patient sent home but has more symptoms and is not eating much. Cr worsened today, now 2.1. Will give IVF and admit for deyhdyration, hallucinations.    Richardean Canal, MD 07/19/13 1520

## 2013-07-19 NOTE — Progress Notes (Signed)
Utilization Review completed.  

## 2013-07-19 NOTE — Progress Notes (Addendum)
[facility] PROGRESS NOTE  Evan [name] ZOX:[id number] DOB: 06-09-1932 DOA: 07/18/2013 PCP: Laurena Slimmer, MD  Assessment/Plan: Acute encephalopathy/visual hallucinations  - Likely metabolic in nature from dehydration, acute on chronic kidney disease and possible medication related.  - CT head 12/12 without acute findings.  - No clinical focus of obvious infection.  - ammonia levels wnl and Prograf levels still pending  - clinically improved- pt awake, lucid and requesting food >>start on diet and resume po meds Dehydration  - Secondary to diarrhea and poor oral intake  - continue IV fluids for now Diarrhea  -resolving, has had no further diarrhea since admission - await C. difficile PCR and GI pathogen panel PCR >>no sample sent so far d/t no further diarrhea - Monitor. No antimicrobials at this time.  Acute on stage III chronic kidney disease/mild hyperkalemia  - Secondary to dehydration.  -continue IV fluid hydration and follow BMP closely.  - cr & k trending down, follow and recheck. CVA with residual right hemiparesis  - await PT and OT evaluation.  Type II DM with renal complications  - Sensitive SSI.  History of atrial fibrillation  - Currently in sinus rhythm.  -resume po cardizem   Code Status: full Family Communication: none at bedside Disposition Plan: pending clinical course   Consultants:  none at bedside  Procedures:  none  Antibiotics:  none  HPI/Subjective: Pt awake & oriented x3. He denies nausea vomiting and no abd pain. Also he has had no further diarrhea  Objective: Filed Vitals:   07/19/13 0414  BP: 177/71  Pulse: 90  Temp: 98.6 F (37 C)  Resp: 18    Intake/Output Summary (Last 24 hours) at 07/19/13 1330 Last data filed at 07/19/13 0500  Gross per 24 hour  Intake      0 ml  Output    250 ml  Net   -250 ml   Filed Weights   07/18/13 2054 07/19/13 0414  Weight: 81.647 kg (180 lb) 81.647 kg (180 lb)    Exam:   General: alert & oriented x 3 In NAD Cardiovascular: RRR, nl S1 s2, 3/ 6 systolic murmur present. Respiratory: CTAB Abdomen: soft +BS NT/ND, no masses palpable Extremities: No cyanosis and no edema    Data Reviewed: Basic Metabolic Panel:  Recent Labs Lab 07/17/13 1150 07/18/13 1835 07/19/13 0441  NA 135 137 140  K 4.9 5.3* 5.2*  CL 99 101 108  CO2 22 23 21   GLUCOSE 148* 173* 82  BUN 50* 56* 53*  CREATININE 1.85* 2.12* 1.89*  CALCIUM 9.5 9.3 9.2   Liver Function Tests:  Recent Labs Lab 07/17/13 1150 07/19/13 0441  AST 25 34  ALT 36 51  ALKPHOS 86 80  BILITOT 0.5 0.5  PROT 8.4* 7.7  ALBUMIN 4.2 3.9   No results found for this basename: LIPASE, AMYLASE,  in the last 168 hours  Recent Labs Lab 07/18/13 2131  AMMONIA 42   CBC:  Recent Labs Lab 07/17/13 1150 07/18/13 1835 07/19/13 0441  WBC 5.0 5.9 5.3  NEUTROABS  --  4.4  --   HGB 11.1* 12.1* 11.5*  HCT 33.8* 35.8* 34.3*  MCV 81.4 80.8 80.9  PLT 219 222 204   Cardiac Enzymes: No results found for this basename: CKTOTAL, CKMB, CKMBINDEX, TROPONINI,  in the last 168 hours BNP (last 3 results) No results found for this basename: PROBNP,  in the last 8760 hours CBG:  Recent Labs Lab 07/17/13 1127 07/19/13 0109 07/19/13 8119  07/19/13 0749 07/19/13 1155  GLUCAP 139* 91 85 75 65*    No results found for this or any previous visit (from the past 240 hour(s)).   Studies: No results found.  Scheduled Meds: . antiseptic oral rinse  15 mL Mouth Rinse q12n4p  . chlorhexidine  15 mL Mouth Rinse BID  . enoxaparin (LOVENOX) injection  30 mg Subcutaneous Q24H  . insulin aspart  0-9 Units Subcutaneous Q4H  . pantoprazole (PROTONIX) IV  40 mg Intravenous Daily  . sodium chloride  3 mL Intravenous Q12H  . sodium polystyrene  30 g Oral Once   Continuous Infusions: . sodium chloride 100 mL/hr at 07/19/13 1113    Principal Problem:   Acute encephalopathy Active Problems:   History of CVA  (cerebrovascular accident)/left pontine infarct June 20,2013   History of renal transplant   CVA (cerebral infarction)   Dehydration   Hyperkalemia   FTT (failure to thrive) in adult   Chronic diastolic heart failure, NYHA class 2   Atrial fibrillation   Acute on chronic renal failure   DM (diabetes mellitus) type II uncontrolled with renal manifestation   Diarrhea    Time spent: 35    [facility]  [facility] Pager [phone]. If 7PM-7AM, please contact night-coverage at [email or web address], password [facility] 07/19/2013, 1:30 PM  LOS: 1 day

## 2013-07-20 DIAGNOSIS — E1129 Type 2 diabetes mellitus with other diabetic kidney complication: Secondary | ICD-10-CM

## 2013-07-20 DIAGNOSIS — N058 Unspecified nephritic syndrome with other morphologic changes: Secondary | ICD-10-CM

## 2013-07-20 LAB — BASIC METABOLIC PANEL
BUN: 41 mg/dL — ABNORMAL HIGH (ref 6–23)
CO2: 20 mEq/L (ref 19–32)
Calcium: 8.5 mg/dL (ref 8.4–10.5)
Creatinine, Ser: 1.63 mg/dL — ABNORMAL HIGH (ref 0.50–1.35)
GFR calc non Af Amer: 38 mL/min — ABNORMAL LOW (ref >90)
Glucose, Bld: 107 mg/dL — ABNORMAL HIGH (ref 70–99)

## 2013-07-20 LAB — GLUCOSE, CAPILLARY
Glucose-Capillary: 101 mg/dL — ABNORMAL HIGH (ref 70–99)
Glucose-Capillary: 118 mg/dL — ABNORMAL HIGH (ref 70–99)
Glucose-Capillary: 144 mg/dL — ABNORMAL HIGH (ref 70–99)
Glucose-Capillary: 209 mg/dL — ABNORMAL HIGH (ref 70–99)
Glucose-Capillary: 58 mg/dL — ABNORMAL LOW (ref 70–99)

## 2013-07-20 MED ORDER — DEXTROSE 50 % IV SOLN: 50.0000 mL | Freq: Once | INTRAVENOUS | Status: AC | PRN

## 2013-07-20 MED ORDER — ENOXAPARIN SODIUM 40 MG/0.4ML ~~LOC~~ SOLN
40.0000 mg | SUBCUTANEOUS | Status: DC
  Administered 2013-07-20 – 2013-07-21 (×2): 40 mg via SUBCUTANEOUS
  Filled 2013-07-20 (×3): qty 0.4

## 2013-07-20 MED ORDER — GLUCOSE 40 % PO GEL
ORAL | Status: AC
  Filled 2013-07-20: qty 1

## 2013-07-20 MED ORDER — DEXTROSE 50 % IV SOLN: 25.0000 mL | Freq: Once | INTRAVENOUS | Status: AC | PRN

## 2013-07-20 MED ORDER — GLUCOSE 40 % PO GEL
1.0000 | ORAL | Status: DC | PRN
  Administered 2013-07-20: 08:00:00 37.5 g via ORAL

## 2013-07-20 NOTE — Evaluation (Signed)
Clinical/Bedside Swallow Evaluation Patient Details  Name: Evan [name] MRN: [id number] Date of Birth: [birth year]  Today's Date: 07/20/2013 Time: 1100-1140 SLP Time Calculation (min): 40 min  Past Medical History:  Past Medical History  Diagnosis Date  . H/O kidney transplant     End stage renal disease  . Diabetes mellitus   . Cancer     hx prostate cancer  . ESRD (end stage renal disease) 2011    hemodialysis started Sept 2011, cadaveric (double) renal  transplant Mar 2012 at [facility]  . Stroke   . GERD (gastroesophageal reflux disease)   . Hypertension   . Dysrhythmia     hx of atrial fibrilation  . Shortness of breath     " sometimes "  . Pneumonia 04/08/2012  . Heart murmur   . Arthritis     hands  . UTI (urinary tract infection)    Past Surgical History:  Past Surgical History  Procedure Laterality Date  . Kidney transplant    . Prostate surgery  approx. 2003    seed implant   HPI:  79 yo male admitted with acute encephalopathy- likely metabolic in nature from dehydration, acute on chronic kidney disease and possible medication related.  PMH + for 2 CVAs with right sided weakness, renal dx s/p organ implant, pna, previous smoker.  Pt reports h/o speech deficits and swallowing disturbance since his CVA.  07/18/2013 CT head negative, old pontine CVAs, 07/18/13 CXR negative.  Swallow evaluation was ordered.    Assessment / Plan / Recommendation Clinical Impression  Pt presents with symptoms of mild oropharyngeal dysphagia with subtle symptoms of possible laryngeal penetration characterized by throat clearing/ delayed cough.  Mildly delayed oral transit suspected likely due to pons CVA.  Pt reports since his stroke, he will occasionally "get strangled" on liquids.  No overt coughing/choking observed during intake observed.  CN exam unremarkable except sluggish palatal elevation.  SLP educated pt to precautions to mitigate dysphagia including using tsp for liquid  administration, cutting hole in cup (for nasal area) to prevent pt from having to use straws or flex head upward.  SLP did not test chin tuck posture on pt due to his arthritis/neck issues.    Pt also admits to issues with excessive saliva production when eating that eases when he masticates gum.  Question if pt has some xerostomia and mastication of gum thins saliva.  Pt also admits to h/o reflux and he currently consumes Tums after meals to manage.  Rec continue diet as ordered.  Encouraged pt to contact physician if his dysphagia worsens or he has recurrent pulmonary infections/weight loss as further swallow testing may be indicated.  All education completed using teach back for assurance to comprehension, SLP to sign off.         Aspiration Risk  Mild (chronic since CVAs)    Diet Recommendation Regular;Thin liquid   Liquid Administration via: Cup Medication Administration: Whole meds with puree (follow with water) Supervision: Patient able to self feed Compensations: Slow rate;Small sips/bites Postural Changes and/or Swallow Maneuvers: Upright 30-60 min after meal;Seated upright 90 degrees    Other  Recommendations Oral Care Recommendations: Oral care BID   Follow Up Recommendations  None    Frequency and Duration   n/a     Pertinent Vitals/Pain Afebrile, decreased    SLP Swallow Goals     Swallow Study Prior Functional Status   h/o dysphagia since cva    General Date of Onset: 07/18/13 HPI: 79  yo male admitted with acute encephalopathy- likely metabolic in nature from dehydration, acute on chronic kidney disease and possible medication related.  PMH + for 2 CVAs with right sided weakness, renal dx s/p organ implant, pna, previous smoker.  Pt reports h/o speech deficits and swallowing disturbance since his CVA.  07/18/2013 CT head negative, old pontine CVAs, 07/18/13 CXR negative.  Swallow evaluation was ordered.  Type of Study: Bedside swallow evaluation Diet Prior to this  Study: Regular;Thin liquids Temperature Spikes Noted: No Respiratory Status: Room air Behavior/Cognition: Alert;Cooperative;Pleasant mood Oral Cavity - Dentition: Adequate natural dentition Self-Feeding Abilities: Able to feed self Patient Positioning: Upright in chair Baseline Vocal Quality: Clear Volitional Cough: Strong Volitional Swallow: Able to elicit    Oral/Motor/Sensory Function Overall Oral Motor/Sensory Function: Appears within functional limits for tasks assessed   Ice Chips Ice chips: Not tested   Thin Liquid Thin Liquid: Impaired Presentation: Cup;Self Fed;Straw Oral Phase Impairments: Reduced lingual movement/coordination Oral Phase Functional Implications: Prolonged oral transit Pharyngeal  Phase Impairments: Throat Clearing - Immediate;Throat Clearing - Delayed;Cough - Delayed    Nectar Thick Nectar Thick Liquid: Not tested   Honey Thick Honey Thick Liquid: Not tested   Puree Puree: Within functional limits Presentation: Self Fed;Spoon   Solid   GO    Solid: Impaired Presentation: Self Fed;Spoon Oral Phase Impairments: Impaired anterior to posterior transit;Reduced lingual movement/coordination Oral Phase Functional Implications: Oral holding Pharyngeal Phase Impairments: Suspected delayed Fredric Mare, MS [facility] SLP [phone]

## 2013-07-20 NOTE — Evaluation (Signed)
Occupational Therapy Evaluation Patient Details Name: Evan [name] MRN: [id number] DOB: June 05, 1932 Today's Date: 07/20/2013 Time: 4098-1191 OT Time Calculation (min): 36 min  OT Assessment / Plan / Recommendation History of present illness 79 yo male admitted with acute encephalopathy. Hx of CVA, HTN, hallucinations, R sided residual weakness.    Clinical Impression   Pt presents to OT with decreased I with ADL activity and will benefit from skilled OT to increase I with ADL activity and return to PLOF    OT Assessment  Patient needs continued OT Services    Follow Up Recommendations  Home health OT;Supervision/Assistance - 24 hour       Equipment Recommendations  None recommended by OT       Frequency  Min 2X/week    Precautions / Restrictions Precautions Precautions: Fall Restrictions Weight Bearing Restrictions: No       ADL  Grooming: Set up Where Assessed - Grooming: Supported sitting Upper Body Bathing: Moderate assistance Where Assessed - Upper Body Bathing: Unsupported sitting Lower Body Bathing: +1 Total assistance Where Assessed - Lower Body Bathing: Supported sit to stand Upper Body Dressing: Minimal assistance Where Assessed - Upper Body Dressing: Supported sitting Lower Body Dressing: +1 Total assistance Where Assessed - Lower Body Dressing: Supported sit to Pharmacist, hospital: +2 Total assistance Toilet Transfer: Patient Percentage: 30% Statistician Method: Sit to stand;Stand pivot Where Assessed - Engineer, mining and Hygiene: Standing Tub/Shower Transfer: +1 Total assistance    OT Diagnosis: Generalized weakness  OT Problem List: Decreased strength OT Treatment Interventions: Self-care/ADL training;DME and/or AE instruction;Patient/family education   OT Goals(Current goals can be found in the care plan section) Acute Rehab OT Goals Patient Stated Goal: return home OT Goal Formulation: With patient Time For Goal  Achievement: 08/03/13  Visit Information  Last OT Received On: 07/20/13 Assistance Needed: +2 History of Present Illness: 79 yo male admitted with acute encephalopathy. Hx of CVA, HTN, hallucinations, R sided residual weakness.        Prior Functioning     Home Living Family/patient expects to be discharged to:: Private residence Living Arrangements: Spouse/significant other Type of Home: House Home Access: Ramped entrance Home Layout: One level Home Equipment: Emergency planning/management officer - 2 wheels;Wheelchair - manual Prior Function Level of Independence: Needs assistance Gait / Transfers Assistance Needed: uses walker for minimal ambulation. Pt states he requires assistance for all tasks/mobility.  ADL's / Homemaking Assistance Needed: needs A Communication / Swallowing Assistance Needed: speaks quietly Communication Communication: No difficulties         Vision/Perception Vision - History Baseline Vision: Wears glasses all the time Patient Visual Report: No change from baseline   Cognition  Cognition Arousal/Alertness: Awake/alert Behavior During Therapy: WFL for tasks assessed/performed Overall Cognitive Status: Within Functional Limits for tasks assessed    Extremity/Trunk Assessment Upper Extremity Assessment Upper Extremity Assessment: Generalized weakness Lower Extremity Assessment Lower Extremity Assessment: RLE deficits/detail;LLE deficits/detail RLE Deficits / Details: Strength at least 3/5 throughout except knee flexion 2+/5, . Decreased DF and knee ext ROM LLE Deficits / Details: Strength at least 4/5 throughout. Decreased DF and knee ext ROM Cervical / Trunk Assessment Cervical / Trunk Assessment: Kyphotic     Mobility Bed Mobility Bed Mobility: Supine to Sit Supine to Sit: 1: +2 Total assist;HOB elevated;With rails Supine to Sit: Patient Percentage: 30% Details for Bed Mobility Assistance: Increased time. Assist for trunk and bil LEs. Utilized bedpad for  scooting, positioning.  Transfers Sit to Stand: 1: +2 Total  assist;From bed;With upper extremity assist Sit to Stand: Patient Percentage: 30% Stand to Sit: To chair/3-in-1;With upper extremity assist;1: +2 Total assist Stand to Sit: Patient Percentage: 30% Details for Transfer Assistance: Assist to rise, stabilize, control descent, maneuver with walker.. Facilitaiton at gluteals, sacrum to encourage extension. Stood EOB for ~1 minute to alllow pt to correct standing posture with assistance.         Balance Balance Balance Assessed: Yes Static Sitting Balance Static Sitting - Balance Support: Bilateral upper extremity supported;Feet supported Static Sitting - Level of Assistance: 4: Min assist Static Sitting - Comment/# of Minutes: Pt has diffiulty keeping R foot flat on floor and R knee flexed. Tends to lean to L side and requires Min assist intermittently to correct.  Static Standing Balance Static Standing - Balance Support: Bilateral upper extremity supported Static Standing - Level of Assistance: 1: +1 Total assist   End of Session OT - End of Session Equipment Utilized During Treatment: Gait belt Activity Tolerance: Patient tolerated treatment well Patient left: in chair Nurse Communication: Mobility status  GO     Evan [name] 07/20/2013, 12:15 PM

## 2013-07-20 NOTE — Progress Notes (Signed)
This patient is receiving pantoprazole. Based on criteria approved by the Pharmacy and Therapeutics Committee, this medication is being converted to the equivalent oral dose form. These criteria include:   . The patient is eating (either orally or per tube) and/or has been taking other orally administered medications for at least 24 hours.  . This patient has no evidence of active gastrointestinal bleeding or impaired GI absorption (gastrectomy, short bowel, patient on TNA or NPO).   If you have questions about this conversion, please contact the pharmacy department.  Dannielle Huh, [facility] 07/20/2013 12:32 PM

## 2013-07-20 NOTE — Progress Notes (Signed)
[facility] PROGRESS NOTE  Ark Agrusa ZOX:[id number] DOB: 08-25-1931 DOA: 07/18/2013 PCP: Laurena Slimmer, MD  Assessment/Plan: Acute encephalopathy/visual hallucinations  - Likely metabolic in nature from dehydration, acute on chronic kidney disease and possible medication related.  - CT head 12/12 without acute findings.  - No clinical focus of obvious infection.  - ammonia levels wnl and Prograf levels still pending  - clinically improved- tolerating by mouth well Dehydration  - Secondary to diarrhea and poor oral intake  - continue IV fluids for now Diarrhea  -resolved, has had no further diarrhea since admission - await C. difficile PCR and GI pathogen panel PCR >>no sample sent so far d/t no further diarrhea - Monitor. No antimicrobials at this time.  Acute on stage III chronic kidney disease/mild hyperkalemia  - Secondary to dehydration.  -continue IV fluid hydration and follow BMP closely.  - cr & k trending down, follow and recheck. CVA with residual right hemiparesis  - await PT and OT evaluation.  Type II DM with renal complications and hypoglycemia -Keep off glipizide for now, monitor and further treat accordingly - Sensitive SSI.  History of atrial fibrillation  - Currently in sinus rhythm.  -Continue cardizem   Code Status: full Family Communication: none at bedside Disposition Plan: pending clinical course   Consultants:  none at bedside  Procedures:  none  Antibiotics:  none  HPI/Subjective: Pt alert & oriented x3. patient states no nausea vomiting or diarrhea today. Per nursing episode of hypoglycemia early this a.m.. Objective: Filed Vitals:   07/20/13 1345  BP: 116/44  Pulse: 75  Temp: 98.2 F (36.8 C)  Resp: 20    Intake/Output Summary (Last 24 hours) at 07/20/13 1453 Last data filed at 07/20/13 1300  Gross per 24 hour  Intake   2650 ml  Output   1750 ml  Net    900 ml   Filed Weights   07/18/13 2054 07/19/13 0414  07/20/13 0445  Weight: 81.647 kg (180 lb) 81.647 kg (180 lb) 81.647 kg (180 lb)    Exam:  General: alert & oriented x 3 In NAD Cardiovascular: RRR, nl S1 s2, 3/ 6 systolic murmur present. Respiratory: CTAB Abdomen: soft +BS NT/ND, no masses palpable Extremities: No cyanosis and no edema    Data Reviewed: Basic Metabolic Panel:  Recent Labs Lab 07/17/13 1150 07/18/13 1835 07/19/13 0441 07/20/13 0500  NA 135 137 140 139  K 4.9 5.3* 5.2* 4.6  CL 99 101 108 107  CO2 22 23 21 20   GLUCOSE 148* 173* 82 107*  BUN 50* 56* 53* 41*  CREATININE 1.85* 2.12* 1.89* 1.63*  CALCIUM 9.5 9.3 9.2 8.5   Liver Function Tests:  Recent Labs Lab 07/17/13 1150 07/19/13 0441  AST 25 34  ALT 36 51  ALKPHOS 86 80  BILITOT 0.5 0.5  PROT 8.4* 7.7  ALBUMIN 4.2 3.9   No results found for this basename: LIPASE, AMYLASE,  in the last 168 hours  Recent Labs Lab 07/18/13 2131  AMMONIA 42   CBC:  Recent Labs Lab 07/17/13 1150 07/18/13 1835 07/19/13 0441  WBC 5.0 5.9 5.3  NEUTROABS  --  4.4  --   HGB 11.1* 12.1* 11.5*  HCT 33.8* 35.8* 34.3*  MCV 81.4 80.8 80.9  PLT 219 222 204   Cardiac Enzymes: No results found for this basename: CKTOTAL, CKMB, CKMBINDEX, TROPONINI,  in the last 168 hours BNP (last 3 results) No results found for this basename: PROBNP,  in the  last 8760 hours CBG:  Recent Labs Lab 07/20/13 0040 07/20/13 0443 07/20/13 0732 07/20/13 0812 07/20/13 1133  GLUCAP 82 101* 58* 118* 144*    No results found for this or any previous visit (from the past 240 hour(s)).   Studies: No results found.  Scheduled Meds: . antiseptic oral rinse  15 mL Mouth Rinse q12n4p  . chlorhexidine  15 mL Mouth Rinse BID  . dextrose      . diltiazem  300 mg Oral Daily  . enoxaparin (LOVENOX) injection  40 mg Subcutaneous Q24H  . insulin aspart  0-9 Units Subcutaneous Q4H  . megestrol  400 mg Oral Daily  . mycophenolate  180 mg Oral BID  . pantoprazole  40 mg Oral Daily  .  sodium bicarbonate  1,300 mg Oral BID  . sodium chloride  3 mL Intravenous Q12H  . sodium polystyrene  30 g Oral Once  . tacrolimus  4 mg Oral BID   Continuous Infusions: . sodium chloride 100 mL/hr at 07/20/13 4098    Principal Problem:   Acute encephalopathy Active Problems:   History of CVA (cerebrovascular accident)/left pontine infarct June 20,2013   History of renal transplant   CVA (cerebral infarction)   Dehydration   Hyperkalemia   FTT (failure to thrive) in adult   Chronic diastolic heart failure, NYHA class 2   Atrial fibrillation   Acute on chronic renal failure   DM (diabetes mellitus) type II uncontrolled with renal manifestation   Diarrhea    Time spent: 25    [facility] C  [facility] Pager [phone]. If 7PM-7AM, please contact night-coverage at [email or web address], password [facility] 07/20/2013, 2:53 PM  LOS: 2 days

## 2013-07-20 NOTE — Evaluation (Signed)
Physical Therapy Evaluation Patient Details Name: Evan [name] MRN: [id number] DOB: 1931/12/21 Today's Date: 07/20/2013 Time: 4098-1191 PT Time Calculation (min): 36 min  PT Assessment / Plan / Recommendation History of Present Illness  79 yo male admitted with acute encephalopathy. Hx of CVA, HTN, hallucinations, R sided residual weakness.   Clinical Impression  On eval, pt required +2 assist for mobility-able to perform stand pivot with walker, bed>recliner. Pt reports he requires significant assist at home for all tasks at baseline. Per pt, he has aides, PT/OT, and family (not his wife) that assist with his ADLs/mobility. He states he will continue to have same level of care when he returns home. Recommend HHPT/HHOT, 24 hour S/A, home health aides, as long as family can continue to provide care. If family cannot, may need to consider SNF placement.     PT Assessment  Patient needs continued PT services    Follow Up Recommendations  Home health PT;Supervision/Assistance - 24 hour; home health aide(s). (May need SNF if family decides they cannot continue to provide care)    Does the patient have the potential to tolerate intense rehabilitation      Barriers to Discharge        Equipment Recommendations  None recommended by PT    Recommendations for Other Services OT consult   Frequency Min 3X/week    Precautions / Restrictions Precautions Precautions: Fall Restrictions Weight Bearing Restrictions: No   Pertinent Vitals/Pain Pt denied pain during session     Mobility  Bed Mobility Bed Mobility: Supine to Sit Supine to Sit: 1: +2 Total assist;HOB elevated;With rails Supine to Sit: Patient Percentage: 30% Details for Bed Mobility Assistance: Increased time. Assist for trunk and bil LEs. Utilized bedpad for scooting, positioning.  Transfers Transfers: Sit to Stand;Stand to Sit;Stand Pivot Transfers Sit to Stand: 1: +2 Total assist;From bed;With upper extremity  assist Sit to Stand: Patient Percentage: 30% Stand to Sit: To chair/3-in-1;With upper extremity assist;1: +2 Total assist Stand to Sit: Patient Percentage: 30% Stand Pivot Transfers: 1: +2 Total assist Stand Pivot Transfers: Patient Percentage: 50% Details for Transfer Assistance: Assist to rise, stabilize, control descent, maneuver with walker.. Facilitation at gluteals, sacrum to encourage extension. Stood EOB for ~1 minute to alllow pt to correct standing posture with assistance.  Ambulation/Gait Ambulation/Gait Assistance: Not tested (comment)    Exercises     PT Diagnosis: Difficulty walking;Abnormality of gait;Generalized weakness  PT Problem List: Decreased strength;Decreased range of motion;Decreased balance;Decreased mobility;Decreased coordination;Decreased knowledge of use of DME PT Treatment Interventions: DME instruction;Gait training;Functional mobility training;Therapeutic activities;Therapeutic exercise;Patient/family education;Balance training     PT Goals(Current goals can be found in the care plan section) Acute Rehab PT Goals Patient Stated Goal: return home PT Goal Formulation: With patient Time For Goal Achievement: 08/03/13 Potential to Achieve Goals: Fair  Visit Information  Last PT Received On: 07/20/13 Assistance Needed: +2 History of Present Illness: 79 yo male admitted with acute encephalopathy. Hx of CVA, HTN, hallucinations, R sided residual weakness.        Prior Functioning  Home Living Family/patient expects to be discharged to:: Private residence Living Arrangements: Spouse/significant other Type of Home: House Home Access: Ramped entrance Home Layout: One level Home Equipment: Emergency planning/management officer - 2 wheels;Wheelchair - manual Prior Function Level of Independence: Needs assistance Gait / Transfers Assistance Needed: uses walker for minimal ambulation. Pt states he requires assistance for all tasks/mobility.  ADL's / Homemaking Assistance  Needed: needs A Communication / Swallowing Assistance Needed: speaks  quietly Communication Communication: No difficulties    Cognition  Cognition Arousal/Alertness: Awake/alert Behavior During Therapy: WFL for tasks assessed/performed Overall Cognitive Status: Within Functional Limits for tasks assessed    Extremity/Trunk Assessment Upper Extremity Assessment Upper Extremity Assessment: Defer to OT evaluation Lower Extremity Assessment Lower Extremity Assessment: RLE deficits/detail;LLE deficits/detail RLE Deficits / Details: Strength at least 3/5 throughout except knee flexion 2+/5, . Decreased DF and knee ext ROM LLE Deficits / Details: Strength at least 4/5 throughout. Decreased DF and knee ext ROM Cervical / Trunk Assessment Cervical / Trunk Assessment: Kyphotic   Balance Balance Balance Assessed: Yes Static Sitting Balance Static Sitting - Balance Support: Bilateral upper extremity supported;Feet supported Static Sitting - Level of Assistance: 4: Min assist Static Sitting - Comment/# of Minutes: Pt has diffiulty keeping R foot flat on floor and R knee flexed. Tends to lean to L side and requires Min assist intermittently to correct.  Static Standing Balance Static Standing - Balance Support: Bilateral upper extremity supported Static Standing - Level of Assistance: 1: +1 Total assist  End of Session PT - End of Session Equipment Utilized During Treatment: Gait belt Activity Tolerance: Patient tolerated treatment well Patient left: in chair;with call bell/phone within reach Nurse Communication: Mobility status  GP     Rebeca Alert, MPT Pager: [phone]

## 2013-07-20 NOTE — Progress Notes (Signed)
Hypoglycemic Event  CBG:58  Treatment: 1 tube instant glucose  Symptoms: None  Follow-up CBG: Time:1812 CBG Result:118  Possible Reasons for Event: Unknown  Comments/MD notified:DR. Viyuoh    Orlando Penner  Remember to initiate Hypoglycemia Order Set & complete

## 2013-07-21 LAB — CBC
HCT: 30.1 % — ABNORMAL LOW (ref 39.0–52.0)
Hemoglobin: 10.1 g/dL — ABNORMAL LOW (ref 13.0–17.0)
MCH: 27.3 pg (ref 26.0–34.0)
MCHC: 33.6 g/dL (ref 30.0–36.0)
Platelets: 156 10*3/uL (ref 150–400)
RDW: 13.7 % (ref 11.5–15.5)
WBC: 4.7 10*3/uL (ref 4.0–10.5)

## 2013-07-21 LAB — GLUCOSE, CAPILLARY
Glucose-Capillary: 64 mg/dL — ABNORMAL LOW (ref 70–99)
Glucose-Capillary: 96 mg/dL (ref 70–99)
Glucose-Capillary: 74 mg/dL (ref 70–99)
Glucose-Capillary: 86 mg/dL (ref 70–99)
Glucose-Capillary: 129 mg/dL — ABNORMAL HIGH (ref 70–99)

## 2013-07-21 LAB — BASIC METABOLIC PANEL
BUN: 40 mg/dL — ABNORMAL HIGH (ref 6–23)
Calcium: 8.2 mg/dL — ABNORMAL LOW (ref 8.4–10.5)
Chloride: 108 mEq/L (ref 96–112)
Creatinine, Ser: 1.67 mg/dL — ABNORMAL HIGH (ref 0.50–1.35)
GFR calc Af Amer: 43 mL/min — ABNORMAL LOW (ref >90)
GFR calc non Af Amer: 37 mL/min — ABNORMAL LOW (ref >90)
Potassium: 4.8 mEq/L (ref 3.5–5.1)
Sodium: 138 mEq/L (ref 135–145)

## 2013-07-21 NOTE — Progress Notes (Signed)
CSW reviewed PT evaluation recommending Home Health PT vs. SNF for patient at discharge. CSW spoke with patient, daughter at bedside - Jamesetta So & grandaughter, Ponciano Ort via phone and they are all agreeable with plan for home and to resume [facility] services. Patient informed CSW that he also has private duty aids that come out to the house as well to assist patient.   Patient is anxious to return home, though family does not want him discharged too soon. RNCM, Cookie made aware of plan for home.   No further CSW needs identified - CSW signing off.   Unice Bailey, LCSW [facility] Clinical Social Worker cell #: [phone]

## 2013-07-21 NOTE — Progress Notes (Signed)
Spoke with pt concerning Home Health, pt states that he is active with [facility], also has private duty. Confirmed with [employer]'s in home rep.

## 2013-07-21 NOTE — Progress Notes (Signed)
Hypoglycemic Event  CBG: 64  Treatment: 15 GM carbohydrate snack  Symptoms: None  Follow-up CBG: Time: 05:57 CBG Result: 96  Possible Reasons for Event: Unknown  Comments/MD notified: Text page to MD on call    Sindy Messing  Remember to initiate Hypoglycemia Order Set & complete

## 2013-07-21 NOTE — Progress Notes (Addendum)
[facility] PROGRESS NOTE  Evan [name] ZOX:[id number] DOB: 11-Sep-1931 DOA: 07/18/2013 PCP: Laurena Slimmer, MD  Assessment/Plan: Acute encephalopathy/visual hallucinations  - Likely metabolic in nature from dehydration, hypoglycemia, acute on chronic kidney disease and possible medication related.  - CT head 12/12 without acute findings.  - No clinical focus of obvious infection.  - ammonia levels wnl and Prograf levels still pending  - clinically improved, remains alert and lucid at this time Dehydration  - Secondary to diarrhea and poor oral intake  - improved with hydration, continue for now Diarrhea  -resolved, has had no further diarrhea since admission - await GI pathogen panel PCR, follow - Monitor. No antimicrobials at this time.  Acute on stage III chronic kidney disease/mild hyperkalemia  - Secondary to dehydration.- cr & k trending down -continue IV fluid hydration and follow BMP closely-recheck in am.   CVA with residual right hemiparesis  - PT recommending home health services.  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  History of atrial fibrillation  - Currently in sinus rhythm.  -Continue cardizem   Code Status: full Family Communication: none at bedside Disposition Plan: pending clinical course, follow up on hypoglycemia   Consultants:  none at bedside  Procedures:  none  Antibiotics:  none  HPI/Subjective: Pt alert & oriented x3. per nursing episode of hypoglycemia again overnight. Objective: Filed Vitals:   07/21/13 1333  BP: 134/53  Pulse: 75  Temp: 98.2 F (36.8 C)  Resp: 18    Intake/Output Summary (Last 24 hours) at 07/21/13 1842 Last data filed at 07/21/13 1816  Gross per 24 hour  Intake   2880 ml  Output   1500 ml  Net   1380 ml    Filed Weights   07/19/13 0414 07/20/13 0445 07/21/13 0600  Weight: 81.647 kg (180 lb) 81.647 kg (180 lb) 74.526 kg (164 lb 4.8 oz)    Exam:  General: alert & oriented x 3 In NAD Cardiovascular: RRR, nl S1 s2, 3/ 6 systolic murmur present. Respiratory: CTAB Abdomen: soft +BS NT/ND, no masses palpable Extremities: No cyanosis and no edema    Data Reviewed: Basic Metabolic Panel:  Recent Labs Lab 07/17/13 1150 07/18/13 1835 07/19/13 0441 07/20/13 0500 07/21/13 0405  NA 135 137 140 139 138  K 4.9 5.3* 5.2* 4.6 4.8  CL 99 101 108 107 108  CO2 22 23 21 20  18*  GLUCOSE 148* 173* 82 107* 68*  BUN 50* 56* 53* 41* 40*  CREATININE 1.85* 2.12* 1.89* 1.63* 1.67*  CALCIUM 9.5 9.3 9.2 8.5 8.2*   Liver Function Tests:  Recent Labs Lab 07/17/13 1150 07/19/13 0441  AST 25 34  ALT 36 51  ALKPHOS 86 80  BILITOT 0.5 0.5  PROT 8.4* 7.7  ALBUMIN 4.2 3.9   No results found for this basename: LIPASE, AMYLASE,  in the last 168 hours  Recent Labs Lab 07/18/13 2131  AMMONIA 42   CBC:  Recent Labs Lab 07/17/13 1150 07/18/13 1835 07/19/13 0441 07/21/13 0405  WBC 5.0 5.9 5.3 4.7  NEUTROABS  --  4.4  --   --   HGB 11.1* 12.1* 11.5* 10.1*  HCT 33.8* 35.8* 34.3* 30.1*  MCV 81.4 80.8 80.9 81.4  PLT 219 222 204 156   Cardiac Enzymes: No results found for this basename: CKTOTAL, CKMB, CKMBINDEX, TROPONINI,  in the last 168 hours BNP (last 3 results) No results found for this basename: PROBNP,  in the last 8760 hours CBG:  Recent Labs Lab 07/21/13 0417 07/21/13 0557 07/21/13 0748 07/21/13 1158 07/21/13 1628  GLUCAP 64* 96 74 86 129*    No results found for this or any previous visit (from the past 240 hour(s)).   Studies: No results found.  Scheduled Meds: . antiseptic oral rinse  15 mL Mouth Rinse q12n4p  . chlorhexidine  15 mL Mouth Rinse BID  . diltiazem  300 mg Oral Daily  . enoxaparin (LOVENOX) injection  40 mg Subcutaneous Q24H  . insulin aspart  0-9  Units Subcutaneous Q4H  . megestrol  400 mg Oral Daily  . mycophenolate  180 mg Oral BID  . pantoprazole  40 mg Oral Daily  . sodium bicarbonate  1,300 mg Oral BID  . sodium chloride  3 mL Intravenous Q12H  . sodium polystyrene  30 g Oral Once  . tacrolimus  4 mg Oral BID   Continuous Infusions: . sodium chloride 100 mL/hr at 07/21/13 0507    Principal Problem:   Acute encephalopathy Active Problems:   History of CVA (cerebrovascular accident)/left pontine infarct June 20,2013   History of renal transplant   CVA (cerebral infarction)   Dehydration   Hyperkalemia   FTT (failure to thrive) in adult   Chronic diastolic heart failure, NYHA class 2   Atrial fibrillation   Acute on chronic renal failure   DM (diabetes mellitus) type II uncontrolled with renal manifestation   Diarrhea    Time spent: 25    [facility] C  [facility] Pager [phone]. If 7PM-7AM, please contact night-coverage at [email or web address], password [facility] 07/21/2013, 6:42 PM  LOS: 3 days

## 2013-07-21 NOTE — Progress Notes (Signed)
Physical Therapy Treatment Patient Details Name: Evan [name] MRN: [id number] DOB: 1932-03-19 Today's Date: 07/21/2013 Time: 4098-1191 PT Time Calculation (min): 25 min  PT Assessment / Plan / Recommendation  History of Present Illness 79 yo male admitted with acute encephalopathy. Hx of CVA, HTN, hallucinations, R sided residual weakness.    PT Comments   Pt tolerated short distance ambulation. Pt  Improved in mobility, posterior lean initially.  Follow Up Recommendations  Home health PT;Supervision/Assistance - 24 hour     Does the patient have the potential to tolerate intense rehabilitation     Barriers to Discharge        Equipment Recommendations  None recommended by PT    Recommendations for Other Services    Frequency Min 3X/week   Progress towards PT Goals Progress towards PT goals: Progressing toward goals  Plan Current plan remains appropriate    Precautions / Restrictions Precautions Precautions: Fall Precaution Comments: incontinence   Pertinent Vitals/Pain     Mobility  Transfers Sit to Stand: 1: +2 Total assist;With upper extremity assist;From chair/3-in-1 Sit to Stand: Patient Percentage: 50% Stand to Sit: To chair/3-in-1;With upper extremity assist;1: +2 Total assist Stand to Sit: Patient Percentage: 50% Stand Pivot Transfers: 1: +2 Total assist Stand Pivot Transfers: Patient Percentage: 50% Details for Transfer Assistance: Assist to rise, stabilize, control descent, maneuver with walker.. Facilitaiton at gluteals, sacrum to encourage extension.   Ambulation/Gait Ambulation/Gait Assistance: 1: +2 Total assist Ambulation/Gait: Patient Percentage: 60% Ambulation Distance (Feet): 20 Feet Assistive device: Rolling walker Ambulation/Gait Assistance Details: cues for posture, head very forward flexed. extra time for turns, assist to turn with RW. Gait Pattern: Step-to pattern;Decreased stride length;Decreased hip/knee flexion - right;Decreased  hip/knee flexion - left    Exercises General Exercises - Lower Extremity Long Arc Quad: AROM;Both;10 reps;Seated Hip Flexion/Marching: AROM;Both;10 reps;Seated   PT Diagnosis:    PT Problem List:   PT Treatment Interventions:     PT Goals (current goals can now be found in the care plan section)    Visit Information  Assistance Needed: +2 History of Present Illness: 79 yo male admitted with acute encephalopathy. Hx of CVA, HTN, hallucinations, R sided residual weakness.     Subjective Data      Cognition  Cognition Arousal/Alertness: Awake/alert Behavior During Therapy: WFL for tasks assessed/performed Overall Cognitive Status: Within Functional Limits for tasks assessed    Balance  Static Standing Balance Static Standing - Balance Support: Bilateral upper extremity supported Static Standing - Level of Assistance: 3: Mod assist Static Standing - Comment/# of Minutes: initially leans posteriorly, improved with walking.  cues for keeping trunk forward.  End of Session PT - End of Session Activity Tolerance: Patient tolerated treatment well Patient left: in chair;with call bell/phone within reach;with family/visitor present Nurse Communication: Mobility status   GP     Rada Hay 07/21/2013, 5:00 PM Blanchard Kelch PT [phone]

## 2013-07-22 LAB — GI PATHOGEN PANEL BY PCR, STOOL
Campylobacter by PCR: NEGATIVE
Cryptosporidium by PCR: NEGATIVE
E coli (ETEC) LT/ST: NEGATIVE
G lamblia by PCR: NEGATIVE
Norovirus GI/GII: NEGATIVE
Rotavirus A by PCR: NEGATIVE
Salmonella by PCR: NEGATIVE

## 2013-07-22 LAB — GLUCOSE, CAPILLARY
Glucose-Capillary: 146 mg/dL — ABNORMAL HIGH (ref 70–99)
Glucose-Capillary: 99 mg/dL (ref 70–99)
Glucose-Capillary: 149 mg/dL — ABNORMAL HIGH (ref 70–99)

## 2013-07-22 MED ORDER — INSULIN ASPART 100 UNIT/ML ~~LOC~~ SOLN: 5.0000 [IU] | Freq: Three times a day (TID) | SUBCUTANEOUS | Status: DC

## 2013-07-22 MED ORDER — GLUCOSE 40 % PO GEL: 1.0000 | ORAL | Status: DC | PRN

## 2013-07-22 MED ORDER — FUROSEMIDE 20 MG PO TABS: 20.0000 mg | ORAL_TABLET | Freq: Every day | ORAL | Status: DC | PRN

## 2013-07-22 NOTE — Progress Notes (Deleted)
Occupational Therapy Treatment Patient Details Name: Evan [name] MRN: [id number] DOB: August 07, 1931 Today's Date: 07/22/2013  OT Assessment / Plan / Recommendation  History of present illness 79 yo male admitted with acute encephalopathy. Hx of CVA, HTN, hallucinations, R sided residual weakness.    OT comments    Follow Up Recommendations  Home health OT       Equipment Recommendations  None recommended by OT       Frequency Min 2X/week      Plan Discharge plan remains appropriate    Precautions / Restrictions Precautions Precautions: Fall Precaution Comments: incontinence       ADL  Toilet Transfer: Performed Toilet Transfer Method: Sit to stand;Stand pivot Acupuncturist: Bedside commode Where Assessed - Toileting Clothing Manipulation and Hygiene: Standing;Sit to stand from 3-in-1 or toilet Tub/Shower Transfer: Maximal assistance      OT Goals(current goals can now be found in the care plan section)    Visit Information  Assistance Needed: +2 History of Present Illness: 79 yo male admitted with acute encephalopathy. Hx of CVA, HTN, hallucinations, R sided residual weakness.           Cognition  Cognition Arousal/Alertness: Awake/alert Behavior During Therapy: WFL for tasks assessed/performed Overall Cognitive Status: Within Functional Limits for tasks assessed    Mobility  Transfers Transfers: Sit to Stand;Stand to Sit Sit to Stand: 4: Min assist;With upper extremity assist;From chair/3-in-1;From toilet;From bed Stand to Sit: 4: Min assist;To chair/3-in-1;With upper extremity assist;To toilet Details for Transfer Assistance: Assist to rise, stabilize, control descent, maneuver with walker.. Facilitaiton at gluteals, sacrum to encourage extension.            End of Session OT - End of Session Activity Tolerance: Patient tolerated treatment well Patient left: in bed Nurse Communication: Mobility status  GO     [name] 07/22/2013, 11:11 AM

## 2013-07-22 NOTE — Discharge Summary (Addendum)
Physician Discharge Summary  Boston Cookson MVH:[id number] DOB: [birth year] DOA: 07/18/2013  PCP: Laurena Slimmer, MD  Admit date: 07/18/2013 Discharge date: 07/22/2013  Recommendations for Outpatient Follow-up:  1. Follow up with primary care doctor within one week of discharge for reevaluation, including review of blood sugars and mentation.  Please follow up pending GI pathogen panel.  Repeat BMP in 1 week to check creatinine, bicarb, and potassium.    Discharge Diagnoses:  Principal Problem:   Acute encephalopathy Active Problems:   History of CVA (cerebrovascular accident)/left pontine infarct June 20,2013   History of renal transplant   CVA (cerebral infarction)   Dehydration   Hyperkalemia   FTT (failure to thrive) in adult   Chronic diastolic heart failure, NYHA class 2   Atrial fibrillation   Acute on chronic renal failure   DM (diabetes mellitus) type II uncontrolled with renal manifestation   Diarrhea   Discharge Condition: stable, improved  Diet recommendation: diabetic diet  Wt Readings from Last 3 Encounters:  07/22/13 78.4 kg (172 lb 13.5 oz)  06/24/13 83.915 kg (185 lb)  08/25/12 78.926 kg (174 lb)    History of present illness:  Evan [name] is a 79 y.o. male with extensive PMH-ESRD, previously on hemodialysis, status post renal transplant on dual immunosuppressants-Prograf and mycophenolate, type II DM, CVA with residual right hemiparesis, hypertension, atrial fibrillation, GERD, presented to the ED with complaints of worsening altered mental status and diarrhea. Patient unable to provide any history secondary to mental status changes. History is obtained from patient's spouse and son at bedside. They gave history of one-2 months of intermittent hallucinations which have lately gotten worse in the last 1 week. He has visual hallucinations where he sees and talks to people, sees animals and things. Apparently his Prograf dose was recently decreased  secondary to tremors. He apparently stays awake all night due to the uncomfortable bed but sleeps in the daytime on his recliner. He also is said to be getting Tylenol PM frequently. Family gives approximately 5 days history of diarrhea 2-3 times per day. Oral intake has apparently decreased. He said to be on a regular consistency diet at home. He has a chronic ulcer on his sacrum which is unchanged and is clean. No history of fever, chills, nausea, vomiting, chest pain, cough, dyspnea. He has complained of some shoulder pains and abdominal pains and the x-rays of his shoulders apparently have shown arthritis. He was seen in the ED yesterday and his mental status changes were felt to be secondary to medication side effect and patient was discharged home with advice to hold Tylenol PM and Benadryl. In the ED, potassium 5.3, creatinine 2.12 which is elevated from baseline of 1.4-1.5, hemoglobin 12.1, chest x-ray and CT head from 07/17/13 without acute findings and urinalysis without features for UTI. Hospitalist admission requested.    Hospital Course:   Acute encephalopathy/visual hallucinations hallucinations were likely secondary to dehydration, hypoglycemia, acute on chronic kidney disease.  CT head was negative.  His ammonia levels were normal and prograf level was 8.3.  His mentation improved with hydration and he has remained alert and lucid for several days prior to discharge.   Dehydration likely secondary to diarrhea and poor oral intake.    Diarrhea.  Resolved, has had no further diarrhea since admission.  May have been related to gastroenteritis.  C. Diff was canceled by lab because diarrhea resolved.  GI pathogen panel PCR is pending at the time of discharge.  Primary  care doctor to follow up  Acute on stage III chronic kidney disease/mild hyperkalemia.  Baseline creatinine is 1.4 to 1.7.  Started around 2 and trended down to 1.6 range with IVF.    CVA with residual right hemiparesis.   Stable.  PT recommending resuming home health services.   Type II DM with renal complications and hypoglycemia.  His glipizide was held and recommend SSI only at home for further management.    History of atrial fibrillation.  Currently in sinus rhythm.  Continued cardizem.     Consultants:  none at bedside Procedures:  none Antibiotics:  none   Discharge Exam: Filed Vitals:   07/22/13 1336  BP: 140/49  Pulse: 73  Temp: 98.3 F (36.8 C)  Resp: 18   Filed Vitals:   07/21/13 1333 07/21/13 2002 07/22/13 0412 07/22/13 1336  BP: 134/53 138/51 142/53 140/49  Pulse: 75 77 77 73  Temp: 98.2 F (36.8 C) 97.7 F (36.5 C) 98.3 F (36.8 C) 98.3 F (36.8 C)  TempSrc: Oral Oral Oral Oral  Resp: 18 18 18 18   Height:      Weight:   78.4 kg (172 lb 13.5 oz)   SpO2: 100% 100% 100% 100%    General: Thin male, NAD HEENT:  NCAT, MMM Cardiovascular: RRR, 3/6 systolic murmur at the left and right sternal borders Respiratory: CTAB ABD:  NABS, soft, NT/ND MSK:  No LEE  Discharge Instructions      Discharge Orders   Future Appointments Provider Department Dept Phone   07/23/2013 10:30 AM [facility] [phone]   09/23/2013 10:30 AM Santiago Bumpers, DPM [facility] [phone]   11/05/2013 2:30 PM Lesleigh Noe, MD [facility] [phone]   Future Orders Complete By Expires   Call MD for:  difficulty breathing, headache or visual disturbances  As directed    Call MD for:  extreme fatigue  As directed    Call MD for:  hives  As directed    Call MD for:  persistant dizziness or light-headedness  As directed    Call MD for:  persistant nausea and vomiting  As directed    Call MD for:  severe uncontrolled pain  As directed    Call MD for:  temperature >100.4  As directed    Diet Carb Modified  As directed    Discharge instructions  As directed    Comments:     You were hospitalized  with confusion due to dehydration.  You were given IV fluids and your dehydration and thinking improved.  You had some mild kidney injury (elevated creatinine) which was probably from your dehydration and this improved with your IV fluids.  Your prograf level was 8.3.  Please do not restart your lasix for now, but weigh yourself daily and if you gain more than 3-lbs in one day or 5-lbs in one week, restart your lasix.  You had some low blood sugars.  Please do NOT take your glipizide.  I have also reduced your insulin sliding scale.  Administer insulin only for blood sugar of 200 or greater.  Please see your primary care doctor in one week for repeat blood work and to follow up on the pending test for diarrhea.  Seek immediate medical attention if you have worsening or concerning symptoms.   Increase activity slowly  As directed        Medication List    STOP  taking these medications       glipiZIDE 10 MG tablet  Commonly known as:  GLUCOTROL      TAKE these medications       baclofen 20 MG tablet  Commonly known as:  LIORESAL  Take 20 mg by mouth at bedtime. For hiccups     calcium carbonate 750 MG chewable tablet  Commonly known as:  TUMS EX  Chew 1 tablet by mouth 3 (three) times daily.     dextrose 40 % Gel  Commonly known as:  GLUTOSE  Take 37.5 g by mouth as needed for low blood sugar (CBG less than 70 or CBG greater than 70 and next meal is more than 1 hours away).     diltiazem 300 MG 24 hr capsule  Commonly known as:  TIAZAC  Take 300 mg by mouth daily.     furosemide 20 MG tablet  Commonly known as:  LASIX  Take 1 tablet (20 mg total) by mouth daily as needed for fluid or edema.     guaiFENesin 600 MG 12 hr tablet  Commonly known as:  MUCINEX  Take 600 mg by mouth 2 (two) times daily as needed for cough.     insulin aspart 100 UNIT/ML injection  Commonly known as:  novoLOG  Inject 5-10 Units into the skin 3 (three) times daily before meals. cbg 200-300 5 units;  301-400 8 units; 401-500 10 units     loratadine 10 MG tablet  Commonly known as:  CLARITIN  Take 10 mg by mouth daily as needed. allergies     megestrol 400 MG/10ML suspension  Commonly known as:  MEGACE  Take 400 mg by mouth daily as needed. For appetite     mycophenolate 180 MG EC tablet  Commonly known as:  MYFORTIC  Take 180 mg by mouth 2 (two) times daily.     omeprazole 40 MG capsule  Commonly known as:  PRILOSEC  Take 40 mg by mouth 2 (two) times daily.     polyethylene glycol packet  Commonly known as:  MIRALAX / GLYCOLAX  Take 17 g by mouth daily as needed. constipation     sodium bicarbonate 650 MG tablet  Take 1,300 mg by mouth 2 (two) times daily.     tacrolimus 1 MG capsule  Commonly known as:  PROGRAF  Take 4 mg by mouth 2 (two) times daily.     TYLENOL PM EXTRA STRENGTH 25-500 MG Tabs  Generic drug:  diphenhydramine-acetaminophen  Take 2 tablets by mouth at bedtime as needed (sleep).       Follow-up Information   Follow up with Laurena Slimmer, MD. Schedule an appointment as soon as possible for a visit in 1 week.   Specialty:  Internal Medicine   Contact information:   [address] [place] [address] [phone]        The results of significant diagnostics from this hospitalization (including imaging, microbiology, ancillary and laboratory) are listed below for reference.    Significant Diagnostic Studies: Dg Chest 2 View  07/17/2013   CLINICAL DATA:  07/17/2013  EXAM: CHEST  2 VIEW  COMPARISON:  04/08/2012  FINDINGS: Low lung volumes. The heart size and mediastinal contours are within normal limits. Both lungs are clear. The visualized skeletal structures are unremarkable.  IMPRESSION: No active cardiopulmonary disease.   Electronically Signed   By: Salome Holmes M.D.   On: 07/17/2013 12:46   Dg Cervical Spine Complete  06/24/2013  CLINICAL DATA:  Neck pain  EXAM: CERVICAL SPINE  4+ VIEWS  COMPARISON:  03/18/2012   FINDINGS: There is reversal of normal cervical lordosis. The C5 and C6 vertebra appear fused. Multi level disc space narrowing and ventral endplate spurring is noted. This is most notable at C3-4 and C4-C5. The C7 an T1 vertebra are not well seen. The prevertebral soft tissue space appears normal. No fracture or subluxation identified.  IMPRESSION: 1. Reversal of normal cervical lordosis which is likely secondary to thoracic kyphosis.   Electronically Signed   By: Signa Kell M.D.   On: 06/24/2013 13:56   Dg Abd 1 View  06/24/2013   CLINICAL DATA:  Pain  EXAM: ABDOMEN - 1 VIEW  COMPARISON:  None.  FINDINGS: The bowel gas pattern is normal. No radio-opaque calculi or other significant radiographic abnormality are seen. There are seed implants identified within the prostate gland.  IMPRESSION: 1. Nonobstructive bowel gas pattern.   Electronically Signed   By: Signa Kell M.D.   On: 06/24/2013 14:14   Ct Head Wo Contrast (only If Suspected Head Trauma And/or Pt Is On Anticoagulant)  07/17/2013   CLINICAL DATA:  Hallucinations.  Prior strokes.  EXAM: CT HEAD WITHOUT CONTRAST  TECHNIQUE: Contiguous axial images were obtained from the base of the skull through the vertex without intravenous contrast.  COMPARISON:  CT scan dated 03/25/2012  FINDINGS: There is no acute intracranial hemorrhage, infarction, or mass lesion. There are old strokes in the pons. Slight diffuse atrophy. No osseous abnormality.  IMPRESSION: No acute intracranial abnormality.  Old pontine infarcts.   Electronically Signed   By: Geanie Cooley M.D.   On: 07/17/2013 12:32   Dg Shoulder Left  06/24/2013   CLINICAL DATA:  Shoulder pain  EXAM: LEFT SHOULDER - 2+ VIEW  COMPARISON:  None.  FINDINGS: There is no evidence of fracture or dislocation. Mild degenerative changes are noted at the [facility] joint and glenohumeral joint. Soft tissues are unremarkable.  IMPRESSION: 1. No acute findings. 2. AC joint and glenohumeral joint osteoarthritis.    Electronically Signed   By: Signa Kell M.D.   On: 06/24/2013 14:04    Microbiology: No results found for this or any previous visit (from the past 240 hour(s)).   Labs: Basic Metabolic Panel:  Recent Labs Lab 07/17/13 1150 07/18/13 1835 07/19/13 0441 07/20/13 0500 07/21/13 0405  NA 135 137 140 139 138  K 4.9 5.3* 5.2* 4.6 4.8  CL 99 101 108 107 108  CO2 22 23 21 20  18*  GLUCOSE 148* 173* 82 107* 68*  BUN 50* 56* 53* 41* 40*  CREATININE 1.85* 2.12* 1.89* 1.63* 1.67*  CALCIUM 9.5 9.3 9.2 8.5 8.2*   Liver Function Tests:  Recent Labs Lab 07/17/13 1150 07/19/13 0441  AST 25 34  ALT 36 51  ALKPHOS 86 80  BILITOT 0.5 0.5  PROT 8.4* 7.7  ALBUMIN 4.2 3.9   No results found for this basename: LIPASE, AMYLASE,  in the last 168 hours  Recent Labs Lab 07/18/13 2131  AMMONIA 42   CBC:  Recent Labs Lab 07/17/13 1150 07/18/13 1835 07/19/13 0441 07/21/13 0405  WBC 5.0 5.9 5.3 4.7  NEUTROABS  --  4.4  --   --   HGB 11.1* 12.1* 11.5* 10.1*  HCT 33.8* 35.8* 34.3* 30.1*  MCV 81.4 80.8 80.9 81.4  PLT 219 222 204 156   Cardiac Enzymes: No results found for this basename: CKTOTAL, CKMB, CKMBINDEX, TROPONINI,  in the last  168 hours BNP: BNP (last 3 results) No results found for this basename: PROBNP,  in the last 8760 hours CBG:  Recent Labs Lab 07/21/13 2000 07/22/13 0003 07/22/13 0410 07/22/13 0735 07/22/13 1117  GLUCAP 179* 146* 134* 99 149*    Time coordinating discharge: 45 minutes  Signed:  Galadriel Shroff  [facility] 07/22/2013, 2:23 PM

## 2013-07-22 NOTE — Progress Notes (Signed)
Occupational Therapy Treatment Patient Details Name: Estus Krakowski MRN: [id number] DOB: 1931/12/23 Today's Date: 07/22/2013 Time: 1191-4782 OT Time Calculation (min): 25 min  OT Assessment / Plan / Recommendation  History of present illness 79 yo male admitted with acute encephalopathy. Hx of CVA, HTN, hallucinations, R sided residual weakness.    OT comments  Pt with increased strength this visit  Follow Up Recommendations  Home health OT       Equipment Recommendations  None recommended by OT       Frequency Min 2X/week      Plan Discharge plan remains appropriate    Precautions / Restrictions Precautions Precautions: Fall Precaution Comments: incontinence       ADL  Toilet Transfer: Performed Toilet Transfer Method: Sit to stand;Stand Wellsite geologist: Bedside commode Where Assessed - Toileting Clothing Manipulation and Hygiene: Standing;Sit to stand from 3-in-1 or toilet Tub/Shower Transfer: Maximal assistance      OT Goals(current goals can now be found in the care plan section)    Visit Information  Assistance Needed: +2 History of Present Illness: 79 yo male admitted with acute encephalopathy. Hx of CVA, HTN, hallucinations, R sided residual weakness.           Cognition  Cognition Arousal/Alertness: Awake/alert Behavior During Therapy: WFL for tasks assessed/performed Overall Cognitive Status: Within Functional Limits for tasks assessed    Mobility  Transfers Transfers: Sit to Stand;Stand to Sit Sit to Stand: 4: Min assist;With upper extremity assist;From chair/3-in-1;From toilet;From bed Stand to Sit: 4: Min assist;To chair/3-in-1;With upper extremity assist;To toilet Details for Transfer Assistance: Assist to rise, stabilize, control descent, maneuver with walker.. Facilitaiton at gluteals, sacrum to encourage extension.            End of Session OT - End of Session Activity Tolerance: Patient tolerated treatment  well Patient left: in bed Nurse Communication: Mobility status  GO     [name] 07/22/2013, 11:13 AM

## 2013-07-23 ENCOUNTER — Encounter (HOSPITAL_COMMUNITY)
Admission: RE | Admit: 2013-07-23 | Discharge: 2013-07-23 | Disposition: A | Discharge status: Home / Self Care | Payer: Medicare Other | Source: Ambulatory Visit | Attending: Nephrology | Admitting: Nephrology

## 2013-07-23 DIAGNOSIS — D649 Anemia, unspecified: Secondary | ICD-10-CM | POA: Diagnosis present

## 2013-07-23 DIAGNOSIS — N182 Chronic kidney disease, stage 2 (mild): Secondary | ICD-10-CM | POA: Diagnosis not present

## 2013-07-23 DIAGNOSIS — N2581 Secondary hyperparathyroidism of renal origin: Secondary | ICD-10-CM | POA: Insufficient documentation

## 2013-07-23 LAB — POCT HEMOGLOBIN-HEMACUE: Hemoglobin: 11.7 g/dL — ABNORMAL LOW (ref 13.0–17.0)

## 2013-07-23 LAB — FERRITIN: Ferritin: 1140 ng/mL — ABNORMAL HIGH (ref 22–322)

## 2013-07-23 LAB — RENAL FUNCTION PANEL
Albumin: 3.4 g/dL — ABNORMAL LOW (ref 3.5–5.2)
CO2: 20 mEq/L (ref 19–32)
Calcium: 8.5 mg/dL (ref 8.4–10.5)
Creatinine, Ser: 1.15 mg/dL (ref 0.50–1.35)
GFR calc Af Amer: 68 mL/min — ABNORMAL LOW (ref >90)
Glucose, Bld: 204 mg/dL — ABNORMAL HIGH (ref 70–99)
Phosphorus: 2.2 mg/dL — ABNORMAL LOW (ref 2.3–4.6)
Sodium: 138 mEq/L (ref 135–145)

## 2013-07-23 LAB — IRON AND TIBC
Saturation Ratios: 50 % (ref 20–55)
TIBC: 156 ug/dL — ABNORMAL LOW (ref 215–435)
UIBC: 78 ug/dL — ABNORMAL LOW (ref 125–400)

## 2013-07-23 MED ORDER — DARBEPOETIN ALFA-POLYSORBATE 150 MCG/0.3ML IJ SOLN: 150.0000 ug | INTRAMUSCULAR | Status: DC

## 2013-07-23 MED ORDER — DARBEPOETIN ALFA-POLYSORBATE 150 MCG/0.3ML IJ SOLN
INTRAMUSCULAR | Status: AC
  Administered 2013-07-23: 150 ug via SUBCUTANEOUS
  Filled 2013-07-23: qty 0.3

## 2013-07-25 LAB — CYTOMEGALOVIRUS PCR, QUALITATIVE: Cytomegalovirus DNA: NOT DETECTED

## 2013-08-20 ENCOUNTER — Encounter (HOSPITAL_COMMUNITY)
Admission: RE | Admit: 2013-08-20 | Discharge: 2013-08-20 | Disposition: A | Discharge status: Home / Self Care | Payer: Medicare Other | Source: Ambulatory Visit | Attending: Nephrology | Admitting: Nephrology

## 2013-08-20 DIAGNOSIS — N2581 Secondary hyperparathyroidism of renal origin: Secondary | ICD-10-CM | POA: Diagnosis not present

## 2013-08-20 DIAGNOSIS — N182 Chronic kidney disease, stage 2 (mild): Secondary | ICD-10-CM | POA: Diagnosis not present

## 2013-08-20 DIAGNOSIS — D649 Anemia, unspecified: Secondary | ICD-10-CM | POA: Diagnosis present

## 2013-08-20 LAB — CBC
HCT: 37.8 % — ABNORMAL LOW (ref 39.0–52.0)
HEMOGLOBIN: 12.9 g/dL — AB (ref 13.0–17.0)
MCH: 28 pg (ref 26.0–34.0)
MCHC: 34.1 g/dL (ref 30.0–36.0)
MCV: 82 fL (ref 78.0–100.0)
PLATELETS: 217 10*3/uL (ref 150–400)
RBC: 4.61 MIL/uL (ref 4.22–5.81)
RDW: 14.1 % (ref 11.5–15.5)
WBC: 4.2 10*3/uL (ref 4.0–10.5)

## 2013-08-20 LAB — RENAL FUNCTION PANEL
ALBUMIN: 3.9 g/dL (ref 3.5–5.2)
BUN: 32 mg/dL — ABNORMAL HIGH (ref 6–23)
CHLORIDE: 101 meq/L (ref 96–112)
CO2: 19 meq/L (ref 19–32)
Calcium: 9.5 mg/dL (ref 8.4–10.5)
Creatinine, Ser: 1.47 mg/dL — ABNORMAL HIGH (ref 0.50–1.35)
GFR calc non Af Amer: 44 mL/min — ABNORMAL LOW (ref >90)
GFR, EST AFRICAN AMERICAN: 51 mL/min — AB (ref >90)
Glucose, Bld: 220 mg/dL — ABNORMAL HIGH (ref 70–99)
Phosphorus: 3.3 mg/dL (ref 2.3–4.6)
Potassium: 4.9 mEq/L (ref 3.7–5.3)
SODIUM: 136 meq/L — AB (ref 137–147)

## 2013-08-20 MED ORDER — DARBEPOETIN ALFA-POLYSORBATE 150 MCG/0.3ML IJ SOLN: 150.0000 ug | INTRAMUSCULAR | Status: DC

## 2013-08-21 LAB — IRON AND TIBC
IRON: 45 ug/dL (ref 42–135)
Saturation Ratios: 21 % (ref 20–55)
TIBC: 216 ug/dL (ref 215–435)
UIBC: 171 ug/dL (ref 125–400)

## 2013-08-21 LAB — FERRITIN: Ferritin: 999 ng/mL — ABNORMAL HIGH (ref 22–322)

## 2013-08-22 LAB — TACROLIMUS LEVEL: Tacrolimus (FK506) - LabCorp: 5.2 ng/mL

## 2013-08-25 LAB — CMV (CYTOMEGALOVIRUS) DNA ULTRAQUANT, PCR: CMV DNA Quant: <200 copies/mL (ref <200)

## 2013-09-11 ENCOUNTER — Ambulatory Visit (INDEPENDENT_AMBULATORY_CARE_PROVIDER_SITE_OTHER): Payer: Medicare Other | Admitting: *Deleted

## 2013-09-11 DIAGNOSIS — M79609 Pain in unspecified limb: Secondary | ICD-10-CM

## 2013-09-14 NOTE — Patient Instructions (Signed)
Our office will notify you once your diabetic shoes and insoles arrive. At that time an appointment will be needed to pick them up.  

## 2013-09-14 NOTE — Progress Notes (Signed)
Measured for diabetic shoes and insoles. 

## 2013-09-17 ENCOUNTER — Encounter (HOSPITAL_COMMUNITY)
Admission: RE | Admit: 2013-09-17 | Discharge: 2013-09-17 | Disposition: A | Discharge status: Home / Self Care | Payer: No Typology Code available for payment source | Source: Ambulatory Visit | Attending: Nephrology | Admitting: Nephrology

## 2013-09-17 DIAGNOSIS — N182 Chronic kidney disease, stage 2 (mild): Secondary | ICD-10-CM | POA: Insufficient documentation

## 2013-09-17 DIAGNOSIS — D649 Anemia, unspecified: Secondary | ICD-10-CM | POA: Insufficient documentation

## 2013-09-17 DIAGNOSIS — N2581 Secondary hyperparathyroidism of renal origin: Secondary | ICD-10-CM | POA: Insufficient documentation

## 2013-09-17 LAB — CBC
HCT: 30.7 % — ABNORMAL LOW (ref 39.0–52.0)
HEMOGLOBIN: 10.4 g/dL — AB (ref 13.0–17.0)
MCH: 27.8 pg (ref 26.0–34.0)
MCHC: 33.9 g/dL (ref 30.0–36.0)
MCV: 82.1 fL (ref 78.0–100.0)
Platelets: 126 10*3/uL — ABNORMAL LOW (ref 150–400)
RBC: 3.74 MIL/uL — ABNORMAL LOW (ref 4.22–5.81)
RDW: 13.1 % (ref 11.5–15.5)
WBC: 8.4 10*3/uL (ref 4.0–10.5)

## 2013-09-17 LAB — RENAL FUNCTION PANEL
Albumin: 3.8 g/dL (ref 3.5–5.2)
BUN: 40 mg/dL — ABNORMAL HIGH (ref 6–23)
CO2: 19 meq/L (ref 19–32)
Calcium: 9.4 mg/dL (ref 8.4–10.5)
Chloride: 99 mEq/L (ref 96–112)
Creatinine, Ser: 1.74 mg/dL — ABNORMAL HIGH (ref 0.50–1.35)
GFR calc non Af Amer: 36 mL/min — ABNORMAL LOW (ref >90)
GFR, EST AFRICAN AMERICAN: 41 mL/min — AB (ref >90)
Glucose, Bld: 179 mg/dL — ABNORMAL HIGH (ref 70–99)
POTASSIUM: 5.8 meq/L — AB (ref 3.7–5.3)
Phosphorus: 3.6 mg/dL (ref 2.3–4.6)
Sodium: 135 mEq/L — ABNORMAL LOW (ref 137–147)

## 2013-09-17 LAB — IRON AND TIBC
IRON: 89 ug/dL (ref 42–135)
SATURATION RATIOS: 56 % — AB (ref 20–55)
TIBC: 158 ug/dL — AB (ref 215–435)
UIBC: 69 ug/dL — ABNORMAL LOW (ref 125–400)

## 2013-09-17 LAB — FERRITIN: FERRITIN: 1303 ng/mL — AB (ref 22–322)

## 2013-09-17 MED ORDER — DARBEPOETIN ALFA-POLYSORBATE 150 MCG/0.3ML IJ SOLN
150.0000 ug | INTRAMUSCULAR | Status: DC
  Administered 2013-09-17: 11:00:00 150 ug via SUBCUTANEOUS

## 2013-09-17 MED ORDER — DARBEPOETIN ALFA-POLYSORBATE 150 MCG/0.3ML IJ SOLN
INTRAMUSCULAR | Status: AC
  Filled 2013-09-17: qty 0.3

## 2013-09-19 LAB — TACROLIMUS LEVEL: TACROLIMUS (FK506) - LABCORP: 5.1 ng/mL

## 2013-09-23 ENCOUNTER — Ambulatory Visit: Payer: Medicare Other | Admitting: Podiatry

## 2013-09-29 ENCOUNTER — Encounter: Payer: Self-pay | Admitting: Interventional Cardiology

## 2013-09-30 ENCOUNTER — Ambulatory Visit (INDEPENDENT_AMBULATORY_CARE_PROVIDER_SITE_OTHER): Payer: Medicare Other | Admitting: Podiatry

## 2013-09-30 ENCOUNTER — Encounter: Payer: Self-pay | Admitting: Podiatry

## 2013-09-30 VITALS — BP 138/68 | HR 70 | Resp 12

## 2013-09-30 DIAGNOSIS — M79609 Pain in unspecified limb: Secondary | ICD-10-CM

## 2013-09-30 DIAGNOSIS — B351 Tinea unguium: Secondary | ICD-10-CM

## 2013-10-01 NOTE — Progress Notes (Signed)
Patient ID: Evan [name], male   DOB: March 08, 1932, 80 y.o.   MRN: [id number]  Subjective: This patient presents with caregiver today requesting debridement of painful toenails an evaluation of a previous blister in the posterior right heel.  Objective: Pleasant orientated x3 black male  Dermatological: Posterior right heel has residual several millimeter eschar, without any erythema, edema, or drainage surrounding the site. All the toenails are brittle, discolored, hypertrophic and tender to palpation  Assessment: Eschar posterior right heel without any clinical sign of infection and healing well. Symptomatic onychomycoses x10  Plan: Advised patient and caregiver to apply all-purpose skin lotion on the feet including the posterior right heel on a daily basis. All 10 toenails are debrided without any bleeding.  Reappoint at three-month intervals or sooner if patient has concern

## 2013-10-15 ENCOUNTER — Encounter (HOSPITAL_COMMUNITY)
Admission: RE | Admit: 2013-10-15 | Discharge: 2013-10-15 | Disposition: A | Discharge status: Home / Self Care | Payer: No Typology Code available for payment source | Source: Ambulatory Visit | Attending: Nephrology | Admitting: Nephrology

## 2013-10-15 DIAGNOSIS — D649 Anemia, unspecified: Secondary | ICD-10-CM | POA: Insufficient documentation

## 2013-10-15 DIAGNOSIS — N2581 Secondary hyperparathyroidism of renal origin: Secondary | ICD-10-CM | POA: Insufficient documentation

## 2013-10-15 DIAGNOSIS — N182 Chronic kidney disease, stage 2 (mild): Secondary | ICD-10-CM | POA: Insufficient documentation

## 2013-10-15 LAB — RENAL FUNCTION PANEL
Albumin: 4.1 g/dL (ref 3.5–5.2)
BUN: 31 mg/dL — AB (ref 6–23)
CALCIUM: 9.3 mg/dL (ref 8.4–10.5)
CHLORIDE: 101 meq/L (ref 96–112)
CO2: 24 mEq/L (ref 19–32)
CREATININE: 1.54 mg/dL — AB (ref 0.50–1.35)
GFR calc Af Amer: 48 mL/min — ABNORMAL LOW (ref >90)
GFR, EST NON AFRICAN AMERICAN: 41 mL/min — AB (ref >90)
Glucose, Bld: 104 mg/dL — ABNORMAL HIGH (ref 70–99)
Phosphorus: 3.4 mg/dL (ref 2.3–4.6)
Potassium: 4.6 mEq/L (ref 3.7–5.3)
Sodium: 139 mEq/L (ref 137–147)

## 2013-10-15 LAB — CBC
HCT: 35.4 % — ABNORMAL LOW (ref 39.0–52.0)
Hemoglobin: 12 g/dL — ABNORMAL LOW (ref 13.0–17.0)
MCH: 28 pg (ref 26.0–34.0)
MCHC: 33.9 g/dL (ref 30.0–36.0)
MCV: 82.5 fL (ref 78.0–100.0)
Platelets: 222 10*3/uL (ref 150–400)
RBC: 4.29 MIL/uL (ref 4.22–5.81)
RDW: 13.8 % (ref 11.5–15.5)
WBC: 6.5 10*3/uL (ref 4.0–10.5)

## 2013-10-15 LAB — IRON AND TIBC
IRON: 87 ug/dL (ref 42–135)
SATURATION RATIOS: 40 % (ref 20–55)
TIBC: 217 ug/dL (ref 215–435)
UIBC: 130 ug/dL (ref 125–400)

## 2013-10-15 LAB — FERRITIN: Ferritin: 1092 ng/mL — ABNORMAL HIGH (ref 22–322)

## 2013-10-15 MED ORDER — DARBEPOETIN ALFA-POLYSORBATE 150 MCG/0.3ML IJ SOLN: 150.0000 ug | INTRAMUSCULAR | Status: DC

## 2013-10-18 LAB — TACROLIMUS LEVEL: TACROLIMUS (FK506) - LABCORP: 6.4 ng/mL

## 2013-11-04 ENCOUNTER — Ambulatory Visit (INDEPENDENT_AMBULATORY_CARE_PROVIDER_SITE_OTHER): Payer: Medicare Other | Admitting: Podiatry

## 2013-11-04 DIAGNOSIS — E1049 Type 1 diabetes mellitus with other diabetic neurological complication: Secondary | ICD-10-CM

## 2013-11-04 DIAGNOSIS — L97409 Non-pressure chronic ulcer of unspecified heel and midfoot with unspecified severity: Secondary | ICD-10-CM

## 2013-11-04 NOTE — Patient Instructions (Signed)
Follow written instructions for the diabetic shoes that were dispensed today

## 2013-11-04 NOTE — Progress Notes (Signed)
   Subjective:    Patient ID: Evan [name], male    DOB: [birth year], 80 y.o.   MRN: [id number]  HPI SIZE 11 -Men - [employer] Single Velcro Closure - HPGilBlack (item# [id number] WITH 3 PAIR CUSTOM INSERTS.   Review of Systems     Objective:   Physical Exam Diabetic shoes with custom insoles contour satisfactorily       Assessment & Plan:  Assessment: Satisfactory fit of diabetic shoes and custom shoe insoles Diabetic with neurological manifestations, type I History of diabetic skin ulcers heel  Plan: Diabetic shoes and custom insoles dispensed with where instructions provided and oral and written form. I emphasized the need to wear the shoes for repairs of time initially and gradually increase wearing time.  Reappoint at next scheduled visit for nail debridement or sooner if patient has concern.

## 2013-11-05 ENCOUNTER — Ambulatory Visit: Payer: Medicare Other | Admitting: Interventional Cardiology

## 2013-11-11 ENCOUNTER — Encounter: Payer: Self-pay | Admitting: Interventional Cardiology

## 2013-11-12 ENCOUNTER — Encounter (HOSPITAL_COMMUNITY)
Admission: RE | Admit: 2013-11-12 | Discharge: 2013-11-12 | Disposition: A | Discharge status: Home / Self Care | Payer: No Typology Code available for payment source | Source: Ambulatory Visit | Attending: Nephrology | Admitting: Nephrology

## 2013-11-12 DIAGNOSIS — D649 Anemia, unspecified: Secondary | ICD-10-CM | POA: Insufficient documentation

## 2013-11-12 DIAGNOSIS — N182 Chronic kidney disease, stage 2 (mild): Secondary | ICD-10-CM | POA: Insufficient documentation

## 2013-11-12 DIAGNOSIS — N2581 Secondary hyperparathyroidism of renal origin: Secondary | ICD-10-CM | POA: Insufficient documentation

## 2013-11-12 LAB — RENAL FUNCTION PANEL
Albumin: 3.9 g/dL (ref 3.5–5.2)
BUN: 41 mg/dL — ABNORMAL HIGH (ref 6–23)
CALCIUM: 9.4 mg/dL (ref 8.4–10.5)
CHLORIDE: 105 meq/L (ref 96–112)
CO2: 23 meq/L (ref 19–32)
CREATININE: 1.53 mg/dL — AB (ref 0.50–1.35)
GFR calc Af Amer: 48 mL/min — ABNORMAL LOW (ref >90)
GFR calc non Af Amer: 42 mL/min — ABNORMAL LOW (ref >90)
GLUCOSE: 151 mg/dL — AB (ref 70–99)
Phosphorus: 3.7 mg/dL (ref 2.3–4.6)
Potassium: 4.8 mEq/L (ref 3.7–5.3)
Sodium: 142 mEq/L (ref 137–147)

## 2013-11-12 LAB — CBC
HEMATOCRIT: 27.4 % — AB (ref 39.0–52.0)
HEMOGLOBIN: 9.3 g/dL — AB (ref 13.0–17.0)
MCH: 27.4 pg (ref 26.0–34.0)
MCHC: 33.9 g/dL (ref 30.0–36.0)
MCV: 80.6 fL (ref 78.0–100.0)
Platelets: 198 10*3/uL (ref 150–400)
RBC: 3.4 MIL/uL — ABNORMAL LOW (ref 4.22–5.81)
RDW: 13.8 % (ref 11.5–15.5)
WBC: 4 10*3/uL (ref 4.0–10.5)

## 2013-11-12 LAB — IRON AND TIBC
Iron: 77 ug/dL (ref 42–135)
Saturation Ratios: 38 % (ref 20–55)
TIBC: 201 ug/dL — AB (ref 215–435)
UIBC: 124 ug/dL — ABNORMAL LOW (ref 125–400)

## 2013-11-12 LAB — FERRITIN: FERRITIN: 734 ng/mL — AB (ref 22–322)

## 2013-11-12 MED ORDER — DARBEPOETIN ALFA-POLYSORBATE 150 MCG/0.3ML IJ SOLN
150.0000 ug | INTRAMUSCULAR | Status: DC
  Administered 2013-11-12: 150 ug via SUBCUTANEOUS

## 2013-11-12 MED ORDER — DARBEPOETIN ALFA-POLYSORBATE 150 MCG/0.3ML IJ SOLN
INTRAMUSCULAR | Status: AC
  Administered 2013-11-12: 150 ug via SUBCUTANEOUS
  Filled 2013-11-12: qty 0.3

## 2013-11-14 LAB — TACROLIMUS LEVEL: TACROLIMUS (FK506) - LABCORP: 3.6 ng/mL

## 2013-11-16 LAB — CMV (CYTOMEGALOVIRUS) DNA ULTRAQUANT, PCR: CMV DNA Quant: <200 copies/mL

## 2013-11-17 ENCOUNTER — Ambulatory Visit: Payer: Medicare Other | Admitting: Interventional Cardiology

## 2013-12-10 ENCOUNTER — Encounter (HOSPITAL_COMMUNITY)
Admission: RE | Admit: 2013-12-10 | Discharge: 2013-12-10 | Disposition: A | Discharge status: Home / Self Care | Payer: No Typology Code available for payment source | Source: Ambulatory Visit | Attending: Nephrology | Admitting: Nephrology

## 2013-12-10 DIAGNOSIS — N2581 Secondary hyperparathyroidism of renal origin: Secondary | ICD-10-CM | POA: Insufficient documentation

## 2013-12-10 DIAGNOSIS — N182 Chronic kidney disease, stage 2 (mild): Secondary | ICD-10-CM | POA: Insufficient documentation

## 2013-12-10 DIAGNOSIS — D649 Anemia, unspecified: Secondary | ICD-10-CM | POA: Insufficient documentation

## 2013-12-10 LAB — CBC
HCT: 27.6 % — ABNORMAL LOW (ref 39.0–52.0)
HEMOGLOBIN: 9.1 g/dL — AB (ref 13.0–17.0)
MCH: 26.8 pg (ref 26.0–34.0)
MCHC: 33 g/dL (ref 30.0–36.0)
MCV: 81.4 fL (ref 78.0–100.0)
Platelets: 183 10*3/uL (ref 150–400)
RBC: 3.39 MIL/uL — ABNORMAL LOW (ref 4.22–5.81)
RDW: 13.6 % (ref 11.5–15.5)
WBC: 3.8 10*3/uL — ABNORMAL LOW (ref 4.0–10.5)

## 2013-12-10 LAB — RENAL FUNCTION PANEL
Albumin: 3.7 g/dL (ref 3.5–5.2)
BUN: 38 mg/dL — ABNORMAL HIGH (ref 6–23)
CO2: 22 meq/L (ref 19–32)
CREATININE: 1.42 mg/dL — AB (ref 0.50–1.35)
Calcium: 9.3 mg/dL (ref 8.4–10.5)
Chloride: 105 mEq/L (ref 96–112)
GFR calc Af Amer: 53 mL/min — ABNORMAL LOW (ref >90)
GFR calc non Af Amer: 45 mL/min — ABNORMAL LOW (ref >90)
GLUCOSE: 179 mg/dL — AB (ref 70–99)
PHOSPHORUS: 3.6 mg/dL (ref 2.3–4.6)
Potassium: 4.9 mEq/L (ref 3.7–5.3)
Sodium: 139 mEq/L (ref 137–147)

## 2013-12-10 LAB — IRON AND TIBC
Iron: 55 ug/dL (ref 42–135)
Saturation Ratios: 28 % (ref 20–55)
TIBC: 193 ug/dL — ABNORMAL LOW (ref 215–435)
UIBC: 138 ug/dL (ref 125–400)

## 2013-12-10 LAB — FERRITIN: FERRITIN: 552 ng/mL — AB (ref 22–322)

## 2013-12-10 MED ORDER — DARBEPOETIN ALFA-POLYSORBATE 150 MCG/0.3ML IJ SOLN: 150.0000 ug | INTRAMUSCULAR | Status: DC

## 2013-12-10 MED ORDER — DARBEPOETIN ALFA-POLYSORBATE 150 MCG/0.3ML IJ SOLN
INTRAMUSCULAR | Status: AC
  Administered 2013-12-10: 150 ug via SUBCUTANEOUS
  Filled 2013-12-10: qty 0.3

## 2013-12-12 LAB — TACROLIMUS LEVEL: TACROLIMUS (FK506) - LABCORP: 3.6 ng/mL

## 2013-12-30 ENCOUNTER — Encounter (HOSPITAL_COMMUNITY)
Admission: RE | Admit: 2013-12-30 | Discharge: 2013-12-30 | Disposition: A | Discharge status: Home / Self Care | Payer: Medicare Other | Source: Ambulatory Visit | Attending: Nephrology | Admitting: Nephrology

## 2013-12-30 ENCOUNTER — Ambulatory Visit: Payer: Medicare Other | Admitting: Podiatry

## 2013-12-30 DIAGNOSIS — N182 Chronic kidney disease, stage 2 (mild): Secondary | ICD-10-CM | POA: Insufficient documentation

## 2013-12-30 DIAGNOSIS — D638 Anemia in other chronic diseases classified elsewhere: Secondary | ICD-10-CM | POA: Insufficient documentation

## 2013-12-30 LAB — POCT HEMOGLOBIN-HEMACUE: Hemoglobin: 8.9 g/dL — ABNORMAL LOW (ref 13.0–17.0)

## 2013-12-30 MED ORDER — DARBEPOETIN ALFA-POLYSORBATE 200 MCG/0.4ML IJ SOLN
200.0000 ug | INTRAMUSCULAR | Status: DC
  Administered 2013-12-30: 200 ug via SUBCUTANEOUS

## 2013-12-30 MED ORDER — DARBEPOETIN ALFA-POLYSORBATE 200 MCG/0.4ML IJ SOLN
INTRAMUSCULAR | Status: AC
  Filled 2013-12-30: qty 0.4

## 2013-12-31 ENCOUNTER — Encounter: Payer: Self-pay | Admitting: Interventional Cardiology

## 2013-12-31 ENCOUNTER — Ambulatory Visit (INDEPENDENT_AMBULATORY_CARE_PROVIDER_SITE_OTHER): Payer: Medicare Other | Admitting: Interventional Cardiology

## 2013-12-31 ENCOUNTER — Encounter (HOSPITAL_COMMUNITY): Payer: Medicare Other

## 2013-12-31 VITALS — BP 132/56 | HR 72 | Ht 71.0 in | Wt 180.0 lb

## 2013-12-31 DIAGNOSIS — I359 Nonrheumatic aortic valve disorder, unspecified: Secondary | ICD-10-CM

## 2013-12-31 DIAGNOSIS — I10 Essential (primary) hypertension: Secondary | ICD-10-CM

## 2013-12-31 DIAGNOSIS — T8610 Unspecified complication of kidney transplant: Secondary | ICD-10-CM

## 2013-12-31 DIAGNOSIS — I350 Nonrheumatic aortic (valve) stenosis: Secondary | ICD-10-CM

## 2013-12-31 DIAGNOSIS — I480 Paroxysmal atrial fibrillation: Secondary | ICD-10-CM

## 2013-12-31 DIAGNOSIS — I4891 Unspecified atrial fibrillation: Secondary | ICD-10-CM

## 2013-12-31 DIAGNOSIS — E119 Type 2 diabetes mellitus without complications: Secondary | ICD-10-CM

## 2013-12-31 NOTE — Progress Notes (Signed)
Patient ID: Evan [name], male   DOB: 1932-03-11, 80 y.o.   MRN: [id number]    [address], [address] [place], [place]  [address] Phone: [phone] Fax:  [phone]  Date:  12/31/2013   ID:  Evan [name], DOB 02/11/1932, MRN [id number]  PCP:  Foye Spurling, MD   ASSESSMENT:  1. Paroxysmal atrial fibrillation, without clinical recurrence 2. Aortic stenosis, asymptomatic 3. Renal transplantation 4. Hypertension 5. Neurological dysfunction  PLAN:  1. No cardiac evaluation or change in therapy at this time 2. As cardiovascular processes progress, we need to line expectations with anticipated clinical outcome. At this point I do not believe that they'll be a candidate for aortic valve replacement or procedures. We did not discuss  over. 3. Clinical followup in one year. Cautioned to call if chest pain, syncope, or dyspnea.   SUBJECTIVE: Evan [name] is a 80 y.o. male who has had occasional right parasternal chest discomfort that can last anywhere from seconds to 30 minutes.. Low grade intensity. His spontaneous. There is no associated dyspnea or palpitations. He has not had syncope. He denies neurological complaints although there is progressive reduction in fluid in the of speech since his recent stroke. He is back at home. His wife Romie Minus recently had low back surgery and was in a facility for several months until she could return.   Wt Readings from Last 3 Encounters:  12/31/13 180 lb (81.647 kg)  07/22/13 172 lb 13.5 oz (78.4 kg)  06/24/13 185 lb (83.915 kg)     Past Medical History  Diagnosis Date  . H/O kidney transplant     End stage renal disease  . Diabetes mellitus   . Cancer     hx prostate cancer  . ESRD (end stage renal disease) 2011    hemodialysis started Sept 2011, cadaveric (double) renal  transplant Mar 2012 at [facility]  . Stroke   . GERD (gastroesophageal reflux disease)   . Hypertension   . Dysrhythmia     hx of atrial fibrilation  .  Shortness of breath     " sometimes "  . Pneumonia 04/08/2012  . Heart murmur   . Arthritis     hands  . UTI (urinary tract infection)     Current Outpatient Prescriptions  Medication Sig Dispense Refill  . aspirin 81 MG tablet Take 81 mg by mouth daily.      . baclofen (LIORESAL) 20 MG tablet Take 20 mg by mouth at bedtime. For hiccups      . calcium carbonate (TUMS EX) 750 MG chewable tablet Chew 1 tablet by mouth 3 (three) times daily.      Marland Kitchen dextrose (GLUTOSE) 40 % GEL Take 37.5 g by mouth as needed for low blood sugar (CBG less than 70 or CBG greater than 70 and next meal is more than 1 hours away).  30 Tube  0  . diltiazem (TIAZAC) 300 MG 24 hr capsule Take 300 mg by mouth daily.       . diphenhydramine-acetaminophen (TYLENOL PM EXTRA STRENGTH) 25-500 MG TABS Take 2 tablets by mouth at bedtime as needed (sleep).       . gabapentin (NEURONTIN) 100 MG capsule Take 100 mg by mouth 3 (three) times daily. Take 2 pills 3 times a day      . glipiZIDE (GLUCOTROL) 5 MG tablet Take by mouth daily before breakfast.      . guaiFENesin (MUCINEX) 600 MG 12 hr  tablet Take 600 mg by mouth 2 (two) times daily as needed for cough.      . hydrALAZINE (APRESOLINE) 100 MG tablet Take 50 mg by mouth 2 (two) times daily. 1/2 tab twice a day      . insulin aspart (NOVOLOG) 100 UNIT/ML injection Inject 5-10 Units into the skin 3 (three) times daily before meals. cbg 200-300 5 units; 301-400 8 units; 401-500 10 units  1 vial  12  . loratadine (CLARITIN) 10 MG tablet Take 10 mg by mouth daily as needed. allergies      . megestrol (MEGACE) 400 MG/10ML suspension Take 400 mg by mouth daily as needed. For appetite      . mycophenolate (MYFORTIC) 180 MG EC tablet Take 180 mg by mouth 2 (two) times daily.      . polyethylene glycol (MIRALAX / GLYCOLAX) packet Take 17 g by mouth daily as needed. constipation      . ranitidine (ZANTAC) 300 MG capsule Take 300 mg by mouth daily.      . sodium bicarbonate 650 MG tablet  Take 1,300 mg by mouth 2 (two) times daily.      . tacrolimus (PROGRAF) 5 MG capsule Take 20 mg by mouth 2 (two) times daily. (4 TABLETS TWICE DAILY)       No current facility-administered medications for this visit.    Allergies:   No Known Allergies  Social History:  The patient  reports that he quit smoking about 30 years ago. His smoking use included Cigarettes. He smoked 0.00 packs per day for 15 years. He has never used smokeless tobacco. He reports that he does not drink alcohol or use illicit drugs.   ROS:  Please see the history of present illness.   Denies chills, fever, transient neurological symptoms   All other systems reviewed and negative.   OBJECTIVE: VS:  BP 132/56  Pulse 72  Ht 5\' 11"  (1.803 m)  Wt 180 lb (81.647 kg)  BMI 25.12 kg/m2 Well nourished, well developed, in no acute distress, elderly, impaired expressive speech HEENT: normal Neck: JVD flat. Carotid bruit transmitted bilateral bruits  Cardiac:  normal S1, S2; RRR;  3/6 crescendo decrescendo systolic murmur compatible with aortic stenosis Lungs:  clear to auscultation bilaterally, no wheezing, rhonchi or rales Abd: soft, nontender, no hepatomegaly Ext: Edema absent. Pulses trace bilateral Skin: warm and dry Neuro:  CNs 2-12 intact, no focal abnormalities noted  EKG:  Not performed       Signed, Illene Labrador III, MD 12/31/2013 11:58 AM

## 2013-12-31 NOTE — Patient Instructions (Addendum)
Your physician recommends that you continue on your current medications as directed. Please refer to the Current Medication list given to you today.  Call the office if you are experiencing chest pain, shortness of breath, syncope. [phone]  Your physician wants you to follow-up in: 1 year You will receive a reminder letter in the mail two months in advance. If you don't receive a letter, please call our office to schedule the follow-up appointment.

## 2014-01-06 ENCOUNTER — Encounter: Payer: Self-pay | Admitting: Podiatry

## 2014-01-06 ENCOUNTER — Ambulatory Visit (INDEPENDENT_AMBULATORY_CARE_PROVIDER_SITE_OTHER): Payer: Medicare Other | Admitting: Podiatry

## 2014-01-06 VITALS — BP 166/79 | HR 71 | Resp 16 | Ht 71.0 in | Wt 175.0 lb

## 2014-01-06 DIAGNOSIS — B351 Tinea unguium: Secondary | ICD-10-CM

## 2014-01-06 DIAGNOSIS — M79609 Pain in unspecified limb: Secondary | ICD-10-CM

## 2014-01-06 NOTE — Progress Notes (Signed)
Patient ID: Evan [name], male   DOB: 03-Jun-1932, 80 y.o.   MRN: [id number]  Subjective: 80 year old black male orientated x3, known diabetic presents with care giver  Objective: Patient transferred wheelchair the treatment chair Elongated, brittle, hypertrophic toenails with texture and color changes x10 Mild scaling noted lateral right heel area  Assessment: Symptomatic onychomycoses x10  Plan: Debrided toenails x10 without a bleeding Patient was complaining of occasional pain foot pain, I suggested he talked his internist about possible pain management. Reappoint at three-month intervals

## 2014-01-07 ENCOUNTER — Encounter (HOSPITAL_COMMUNITY): Payer: Medicare Other

## 2014-01-12 ENCOUNTER — Other Ambulatory Visit (HOSPITAL_COMMUNITY): Payer: Self-pay | Admitting: *Deleted

## 2014-01-13 ENCOUNTER — Encounter (HOSPITAL_COMMUNITY)
Admission: RE | Admit: 2014-01-13 | Discharge: 2014-01-13 | Disposition: A | Discharge status: Home / Self Care | Payer: Medicare Other | Source: Ambulatory Visit | Attending: Nephrology | Admitting: Nephrology

## 2014-01-13 DIAGNOSIS — N182 Chronic kidney disease, stage 2 (mild): Secondary | ICD-10-CM | POA: Insufficient documentation

## 2014-01-13 DIAGNOSIS — N2581 Secondary hyperparathyroidism of renal origin: Secondary | ICD-10-CM | POA: Insufficient documentation

## 2014-01-13 DIAGNOSIS — D649 Anemia, unspecified: Secondary | ICD-10-CM | POA: Insufficient documentation

## 2014-01-13 LAB — RENAL FUNCTION PANEL
Albumin: 4.1 g/dL (ref 3.5–5.2)
BUN: 36 mg/dL — AB (ref 6–23)
CHLORIDE: 101 meq/L (ref 96–112)
CO2: 21 mEq/L (ref 19–32)
CREATININE: 1.41 mg/dL — AB (ref 0.50–1.35)
Calcium: 9.4 mg/dL (ref 8.4–10.5)
GFR calc Af Amer: 53 mL/min — ABNORMAL LOW (ref >90)
GFR calc non Af Amer: 46 mL/min — ABNORMAL LOW (ref >90)
Glucose, Bld: 180 mg/dL — ABNORMAL HIGH (ref 70–99)
Phosphorus: 3.8 mg/dL (ref 2.3–4.6)
Potassium: 6 mEq/L — ABNORMAL HIGH (ref 3.7–5.3)
Sodium: 139 mEq/L (ref 137–147)

## 2014-01-13 LAB — CBC
HCT: 34.2 % — ABNORMAL LOW (ref 39.0–52.0)
HEMOGLOBIN: 11.3 g/dL — AB (ref 13.0–17.0)
MCH: 26.5 pg (ref 26.0–34.0)
MCHC: 33 g/dL (ref 30.0–36.0)
MCV: 80.1 fL (ref 78.0–100.0)
Platelets: 236 10*3/uL (ref 150–400)
RBC: 4.27 MIL/uL (ref 4.22–5.81)
RDW: 14.4 % (ref 11.5–15.5)
WBC: 4.2 10*3/uL (ref 4.0–10.5)

## 2014-01-13 LAB — IRON AND TIBC
Iron: 33 ug/dL — ABNORMAL LOW (ref 42–135)
SATURATION RATIOS: 20 % (ref 20–55)
TIBC: 164 ug/dL — ABNORMAL LOW (ref 215–435)
UIBC: 131 ug/dL (ref 125–400)

## 2014-01-13 LAB — FERRITIN: FERRITIN: 320 ng/mL (ref 22–322)

## 2014-01-13 MED ORDER — DARBEPOETIN ALFA-POLYSORBATE 200 MCG/0.4ML IJ SOLN
INTRAMUSCULAR | Status: AC
  Filled 2014-01-13: qty 0.4

## 2014-01-13 MED ORDER — SODIUM CHLORIDE 0.9 % IV SOLN
510.0000 mg | INTRAVENOUS | Status: DC
  Administered 2014-01-13: 510 mg via INTRAVENOUS
  Filled 2014-01-13: qty 17

## 2014-01-13 MED ORDER — DARBEPOETIN ALFA-POLYSORBATE 200 MCG/0.4ML IJ SOLN
200.0000 ug | INTRAMUSCULAR | Status: DC
  Administered 2014-01-13: 200 ug via SUBCUTANEOUS

## 2014-01-15 LAB — TACROLIMUS LEVEL: Tacrolimus (FK506) - LabCorp: 6.3 ng/mL

## 2014-01-20 ENCOUNTER — Encounter (HOSPITAL_COMMUNITY)
Admission: RE | Admit: 2014-01-20 | Discharge: 2014-01-20 | Disposition: A | Discharge status: Home / Self Care | Payer: Medicare Other | Source: Ambulatory Visit | Attending: Nephrology | Admitting: Nephrology

## 2014-01-20 DIAGNOSIS — D649 Anemia, unspecified: Secondary | ICD-10-CM | POA: Diagnosis not present

## 2014-01-20 MED ORDER — SODIUM CHLORIDE 0.9 % IV SOLN
510.0000 mg | INTRAVENOUS | Status: AC
  Administered 2014-01-20: 510 mg via INTRAVENOUS
  Filled 2014-01-20: qty 17

## 2014-01-20 MED ORDER — DARBEPOETIN ALFA-POLYSORBATE 200 MCG/0.4ML IJ SOLN: 200.0000 ug | INTRAMUSCULAR | Status: DC

## 2014-01-26 ENCOUNTER — Encounter (HOSPITAL_COMMUNITY)
Admission: RE | Admit: 2014-01-26 | Discharge: 2014-01-26 | Disposition: A | Discharge status: Home / Self Care | Payer: Medicare Other | Source: Ambulatory Visit | Attending: Nephrology | Admitting: Nephrology

## 2014-01-26 DIAGNOSIS — D649 Anemia, unspecified: Secondary | ICD-10-CM | POA: Diagnosis not present

## 2014-01-26 LAB — POCT HEMOGLOBIN-HEMACUE: Hemoglobin: 12.4 g/dL — ABNORMAL LOW (ref 13.0–17.0)

## 2014-01-26 MED ORDER — DARBEPOETIN ALFA-POLYSORBATE 200 MCG/0.4ML IJ SOLN: 200.0000 ug | INTRAMUSCULAR | Status: DC

## 2014-02-11 ENCOUNTER — Encounter (HOSPITAL_COMMUNITY)
Admission: RE | Admit: 2014-02-11 | Discharge: 2014-02-11 | Disposition: A | Discharge status: Home / Self Care | Payer: No Typology Code available for payment source | Source: Ambulatory Visit | Attending: Nephrology | Admitting: Nephrology

## 2014-02-11 DIAGNOSIS — D638 Anemia in other chronic diseases classified elsewhere: Secondary | ICD-10-CM | POA: Diagnosis present

## 2014-02-11 DIAGNOSIS — N182 Chronic kidney disease, stage 2 (mild): Secondary | ICD-10-CM | POA: Insufficient documentation

## 2014-02-11 LAB — RENAL FUNCTION PANEL
ALBUMIN: 4 g/dL (ref 3.5–5.2)
Anion gap: 11 (ref 5–15)
BUN: 35 mg/dL — ABNORMAL HIGH (ref 6–23)
CALCIUM: 9.4 mg/dL (ref 8.4–10.5)
CO2: 24 mEq/L (ref 19–32)
Chloride: 104 mEq/L (ref 96–112)
Creatinine, Ser: 1.36 mg/dL — ABNORMAL HIGH (ref 0.50–1.35)
GFR calc Af Amer: 55 mL/min — ABNORMAL LOW (ref >90)
GFR, EST NON AFRICAN AMERICAN: 48 mL/min — AB (ref >90)
Glucose, Bld: 167 mg/dL — ABNORMAL HIGH (ref 70–99)
POTASSIUM: 5.1 meq/L (ref 3.7–5.3)
Phosphorus: 3.7 mg/dL (ref 2.3–4.6)
Sodium: 139 mEq/L (ref 137–147)

## 2014-02-11 LAB — CBC
HCT: 37.4 % — ABNORMAL LOW (ref 39.0–52.0)
Hemoglobin: 12.1 g/dL — ABNORMAL LOW (ref 13.0–17.0)
MCH: 26.8 pg (ref 26.0–34.0)
MCHC: 32.4 g/dL (ref 30.0–36.0)
MCV: 82.7 fL (ref 78.0–100.0)
Platelets: 170 10*3/uL (ref 150–400)
RBC: 4.52 MIL/uL (ref 4.22–5.81)
RDW: 14.5 % (ref 11.5–15.5)
WBC: 4.1 10*3/uL (ref 4.0–10.5)

## 2014-02-11 LAB — IRON AND TIBC
Iron: 64 ug/dL (ref 42–135)
Saturation Ratios: 36 % (ref 20–55)
TIBC: 180 ug/dL — ABNORMAL LOW (ref 215–435)
UIBC: 116 ug/dL — ABNORMAL LOW (ref 125–400)

## 2014-02-11 LAB — FERRITIN: FERRITIN: 626 ng/mL — AB (ref 22–322)

## 2014-02-11 MED ORDER — DARBEPOETIN ALFA-POLYSORBATE 200 MCG/0.4ML IJ SOLN: 200.0000 ug | INTRAMUSCULAR | Status: DC

## 2014-02-13 LAB — TACROLIMUS LEVEL: TACROLIMUS (FK506) - LABCORP: 5.1 ng/mL

## 2014-02-16 LAB — CMV (CYTOMEGALOVIRUS) DNA ULTRAQUANT, PCR: CMV DNA Quant: <200 copies/mL

## 2014-02-23 ENCOUNTER — Encounter (HOSPITAL_COMMUNITY)
Admission: RE | Admit: 2014-02-23 | Discharge: 2014-02-23 | Disposition: A | Discharge status: Home / Self Care | Payer: No Typology Code available for payment source | Source: Ambulatory Visit | Attending: Nephrology | Admitting: Nephrology

## 2014-02-23 DIAGNOSIS — D638 Anemia in other chronic diseases classified elsewhere: Secondary | ICD-10-CM | POA: Diagnosis not present

## 2014-02-23 LAB — POCT HEMOGLOBIN-HEMACUE: HEMOGLOBIN: 11 g/dL — AB (ref 13.0–17.0)

## 2014-02-23 MED ORDER — DARBEPOETIN ALFA-POLYSORBATE 200 MCG/0.4ML IJ SOLN
200.0000 ug | INTRAMUSCULAR | Status: DC
  Administered 2014-02-23: 200 ug via SUBCUTANEOUS

## 2014-02-23 MED ORDER — DARBEPOETIN ALFA-POLYSORBATE 200 MCG/0.4ML IJ SOLN
INTRAMUSCULAR | Status: AC
  Filled 2014-02-23: qty 0.4

## 2014-02-25 ENCOUNTER — Encounter (HOSPITAL_COMMUNITY): Payer: Medicare Other

## 2014-03-09 ENCOUNTER — Encounter (HOSPITAL_COMMUNITY)
Admission: RE | Admit: 2014-03-09 | Discharge: 2014-03-09 | Disposition: A | Discharge status: Home / Self Care | Payer: Medicare Other | Source: Ambulatory Visit | Attending: Nephrology | Admitting: Nephrology

## 2014-03-09 DIAGNOSIS — D649 Anemia, unspecified: Secondary | ICD-10-CM | POA: Insufficient documentation

## 2014-03-09 DIAGNOSIS — N2581 Secondary hyperparathyroidism of renal origin: Secondary | ICD-10-CM | POA: Insufficient documentation

## 2014-03-09 DIAGNOSIS — N182 Chronic kidney disease, stage 2 (mild): Secondary | ICD-10-CM | POA: Insufficient documentation

## 2014-03-09 LAB — RENAL FUNCTION PANEL
ANION GAP: 12 (ref 5–15)
Albumin: 4 g/dL (ref 3.5–5.2)
BUN: 34 mg/dL — AB (ref 6–23)
CHLORIDE: 106 meq/L (ref 96–112)
CO2: 22 mEq/L (ref 19–32)
Calcium: 9.4 mg/dL (ref 8.4–10.5)
Creatinine, Ser: 1.5 mg/dL — ABNORMAL HIGH (ref 0.50–1.35)
GFR calc Af Amer: 49 mL/min — ABNORMAL LOW (ref >90)
GFR calc non Af Amer: 43 mL/min — ABNORMAL LOW (ref >90)
Glucose, Bld: 113 mg/dL — ABNORMAL HIGH (ref 70–99)
PHOSPHORUS: 4.2 mg/dL (ref 2.3–4.6)
Potassium: 5.3 mEq/L (ref 3.7–5.3)
Sodium: 140 mEq/L (ref 137–147)

## 2014-03-09 LAB — CBC
HEMATOCRIT: 35.7 % — AB (ref 39.0–52.0)
HEMOGLOBIN: 11.6 g/dL — AB (ref 13.0–17.0)
MCH: 26.8 pg (ref 26.0–34.0)
MCHC: 32.5 g/dL (ref 30.0–36.0)
MCV: 82.4 fL (ref 78.0–100.0)
Platelets: 186 10*3/uL (ref 150–400)
RBC: 4.33 MIL/uL (ref 4.22–5.81)
RDW: 15.6 % — ABNORMAL HIGH (ref 11.5–15.5)
WBC: 4.6 10*3/uL (ref 4.0–10.5)

## 2014-03-09 LAB — IRON AND TIBC
IRON: 40 ug/dL — AB (ref 42–135)
SATURATION RATIOS: 21 % (ref 20–55)
TIBC: 195 ug/dL — AB (ref 215–435)
UIBC: 155 ug/dL (ref 125–400)

## 2014-03-09 LAB — FERRITIN: FERRITIN: 524 ng/mL — AB (ref 22–322)

## 2014-03-09 MED ORDER — DARBEPOETIN ALFA-POLYSORBATE 200 MCG/0.4ML IJ SOLN
200.0000 ug | INTRAMUSCULAR | Status: DC
  Administered 2014-03-09: 200 ug via SUBCUTANEOUS

## 2014-03-09 MED ORDER — DARBEPOETIN ALFA-POLYSORBATE 200 MCG/0.4ML IJ SOLN
INTRAMUSCULAR | Status: AC
  Filled 2014-03-09: qty 0.4

## 2014-03-11 LAB — TACROLIMUS LEVEL: TACROLIMUS (FK506) - LABCORP: 6.6 ng/mL

## 2014-03-22 ENCOUNTER — Other Ambulatory Visit (HOSPITAL_COMMUNITY): Payer: Self-pay | Admitting: *Deleted

## 2014-03-23 ENCOUNTER — Encounter (HOSPITAL_COMMUNITY)
Admission: RE | Admit: 2014-03-23 | Discharge: 2014-03-23 | Disposition: A | Discharge status: Home / Self Care | Payer: Medicare Other | Source: Ambulatory Visit | Attending: Nephrology | Admitting: Nephrology

## 2014-03-23 DIAGNOSIS — D649 Anemia, unspecified: Secondary | ICD-10-CM | POA: Diagnosis not present

## 2014-03-23 LAB — POCT HEMOGLOBIN-HEMACUE: HEMOGLOBIN: 10.5 g/dL — AB (ref 13.0–17.0)

## 2014-03-23 MED ORDER — SODIUM CHLORIDE 0.9 % IV SOLN
510.0000 mg | Freq: Once | INTRAVENOUS | Status: AC
  Administered 2014-03-23: 510 mg via INTRAVENOUS
  Filled 2014-03-23: qty 17

## 2014-03-23 MED ORDER — DARBEPOETIN ALFA-POLYSORBATE 200 MCG/0.4ML IJ SOLN
INTRAMUSCULAR | Status: AC
  Filled 2014-03-23: qty 0.4

## 2014-03-23 MED ORDER — DARBEPOETIN ALFA-POLYSORBATE 200 MCG/0.4ML IJ SOLN
200.0000 ug | INTRAMUSCULAR | Status: DC
  Administered 2014-03-23: 200 ug via SUBCUTANEOUS

## 2014-04-14 ENCOUNTER — Encounter: Payer: Self-pay | Admitting: Podiatry

## 2014-04-14 ENCOUNTER — Ambulatory Visit (INDEPENDENT_AMBULATORY_CARE_PROVIDER_SITE_OTHER): Payer: Medicare Other | Admitting: Podiatry

## 2014-04-14 VITALS — BP 146/86 | HR 72 | Resp 12

## 2014-04-14 DIAGNOSIS — B351 Tinea unguium: Secondary | ICD-10-CM

## 2014-04-14 DIAGNOSIS — M79676 Pain in unspecified toe(s): Secondary | ICD-10-CM

## 2014-04-14 DIAGNOSIS — M79609 Pain in unspecified limb: Secondary | ICD-10-CM

## 2014-04-15 NOTE — Progress Notes (Signed)
Patient ID: Evan [name], male   DOB: 09-28-1931, 80 y.o.   MRN: [id number]  Subjective: Orientated x3 black male presents with caregiver complaining of painful toenails  Objective: Patient transfers from wheelchair to treatment chair The toenails are elongated, discolored, hypertrophic 6-10  Assessment: Symptomatic onychomycoses 6-10  Plan: Nails x10 are debrided without a bleeding  Reappoint at three-month intervals

## 2014-04-20 ENCOUNTER — Encounter (HOSPITAL_COMMUNITY)
Admission: RE | Admit: 2014-04-20 | Discharge: 2014-04-20 | Disposition: A | Discharge status: Home / Self Care | Payer: Medicare Other | Source: Ambulatory Visit | Attending: Nephrology | Admitting: Nephrology

## 2014-04-20 DIAGNOSIS — D509 Iron deficiency anemia, unspecified: Secondary | ICD-10-CM | POA: Diagnosis not present

## 2014-04-20 LAB — CBC
HEMATOCRIT: 40.4 % (ref 39.0–52.0)
HEMOGLOBIN: 13.6 g/dL (ref 13.0–17.0)
MCH: 26.4 pg (ref 26.0–34.0)
MCHC: 33.7 g/dL (ref 30.0–36.0)
MCV: 78.3 fL (ref 78.0–100.0)
Platelets: 140 10*3/uL — ABNORMAL LOW (ref 150–400)
RBC: 5.16 MIL/uL (ref 4.22–5.81)
RDW: 15 % (ref 11.5–15.5)
WBC: 5.5 10*3/uL (ref 4.0–10.5)

## 2014-04-20 LAB — IRON AND TIBC
Iron: 74 ug/dL (ref 42–135)
Saturation Ratios: 41 % (ref 20–55)
TIBC: 181 ug/dL — AB (ref 215–435)
UIBC: 107 ug/dL — ABNORMAL LOW (ref 125–400)

## 2014-04-20 LAB — RENAL FUNCTION PANEL
ALBUMIN: 4 g/dL (ref 3.5–5.2)
ANION GAP: 13 (ref 5–15)
BUN: 34 mg/dL — AB (ref 6–23)
CO2: 22 mEq/L (ref 19–32)
CREATININE: 1.48 mg/dL — AB (ref 0.50–1.35)
Calcium: 9.2 mg/dL (ref 8.4–10.5)
Chloride: 103 mEq/L (ref 96–112)
GFR calc Af Amer: 50 mL/min — ABNORMAL LOW (ref >90)
GFR calc non Af Amer: 43 mL/min — ABNORMAL LOW (ref >90)
GLUCOSE: 176 mg/dL — AB (ref 70–99)
Phosphorus: 3.5 mg/dL (ref 2.3–4.6)
Potassium: 5 mEq/L (ref 3.7–5.3)
Sodium: 138 mEq/L (ref 137–147)

## 2014-04-20 LAB — FERRITIN: FERRITIN: 486 ng/mL — AB (ref 22–322)

## 2014-04-20 MED ORDER — DARBEPOETIN ALFA-POLYSORBATE 200 MCG/0.4ML IJ SOLN: 200.0000 ug | INTRAMUSCULAR | Status: DC

## 2014-04-22 LAB — TACROLIMUS LEVEL: Tacrolimus (FK506) - LabCorp: 5.6 ng/mL

## 2014-05-18 ENCOUNTER — Encounter (HOSPITAL_COMMUNITY)
Admission: RE | Admit: 2014-05-18 | Discharge: 2014-05-18 | Disposition: A | Discharge status: Home / Self Care | Payer: Medicare Other | Source: Ambulatory Visit | Attending: Nephrology | Admitting: Nephrology

## 2014-05-18 DIAGNOSIS — D509 Iron deficiency anemia, unspecified: Secondary | ICD-10-CM | POA: Insufficient documentation

## 2014-05-18 LAB — CBC
HCT: 36.5 % — ABNORMAL LOW (ref 39.0–52.0)
Hemoglobin: 12.2 g/dL — ABNORMAL LOW (ref 13.0–17.0)
MCH: 26 pg (ref 26.0–34.0)
MCHC: 33.4 g/dL (ref 30.0–36.0)
MCV: 77.7 fL — ABNORMAL LOW (ref 78.0–100.0)
PLATELETS: 141 10*3/uL — AB (ref 150–400)
RBC: 4.7 MIL/uL (ref 4.22–5.81)
RDW: 14.8 % (ref 11.5–15.5)
WBC: 6.1 10*3/uL (ref 4.0–10.5)

## 2014-05-18 LAB — IRON AND TIBC
IRON: 127 ug/dL (ref 42–135)
SATURATION RATIOS: 70 % — AB (ref 20–55)
TIBC: 182 ug/dL — AB (ref 215–435)
UIBC: 55 ug/dL — ABNORMAL LOW (ref 125–400)

## 2014-05-18 LAB — RENAL FUNCTION PANEL
ALBUMIN: 4.1 g/dL (ref 3.5–5.2)
Anion gap: 11 (ref 5–15)
BUN: 39 mg/dL — ABNORMAL HIGH (ref 6–23)
CO2: 23 mEq/L (ref 19–32)
Calcium: 9.6 mg/dL (ref 8.4–10.5)
Chloride: 106 mEq/L (ref 96–112)
Creatinine, Ser: 1.48 mg/dL — ABNORMAL HIGH (ref 0.50–1.35)
GFR, EST AFRICAN AMERICAN: 50 mL/min — AB (ref >90)
GFR, EST NON AFRICAN AMERICAN: 43 mL/min — AB (ref >90)
Glucose, Bld: 159 mg/dL — ABNORMAL HIGH (ref 70–99)
Phosphorus: 3.5 mg/dL (ref 2.3–4.6)
Potassium: 5.2 mEq/L (ref 3.7–5.3)
Sodium: 140 mEq/L (ref 137–147)

## 2014-05-18 LAB — FERRITIN: Ferritin: 746 ng/mL — ABNORMAL HIGH (ref 22–322)

## 2014-05-18 MED ORDER — DARBEPOETIN ALFA-POLYSORBATE 200 MCG/0.4ML IJ SOLN: 200.0000 ug | INTRAMUSCULAR | Status: DC

## 2014-05-20 LAB — TACROLIMUS LEVEL: TACROLIMUS (FK506) - LABCORP: 6.2 ng/mL

## 2014-06-01 ENCOUNTER — Encounter (HOSPITAL_COMMUNITY): Payer: Medicare Other

## 2014-06-03 ENCOUNTER — Encounter (HOSPITAL_COMMUNITY)
Admission: RE | Admit: 2014-06-03 | Discharge: 2014-06-03 | Disposition: A | Discharge status: Home / Self Care | Payer: Medicare Other | Source: Ambulatory Visit | Attending: Nephrology | Admitting: Nephrology

## 2014-06-03 DIAGNOSIS — D509 Iron deficiency anemia, unspecified: Secondary | ICD-10-CM | POA: Diagnosis not present

## 2014-06-03 LAB — POCT HEMOGLOBIN-HEMACUE: Hemoglobin: 13.3 g/dL (ref 13.0–17.0)

## 2014-06-03 MED ORDER — DARBEPOETIN ALFA-POLYSORBATE 200 MCG/0.4ML IJ SOLN: 200.0000 ug | INTRAMUSCULAR | Status: DC

## 2014-06-17 ENCOUNTER — Encounter (HOSPITAL_COMMUNITY): Payer: Medicare Other

## 2014-06-17 ENCOUNTER — Other Ambulatory Visit (HOSPITAL_COMMUNITY): Payer: Self-pay

## 2014-06-18 ENCOUNTER — Encounter (HOSPITAL_COMMUNITY)
Admission: RE | Admit: 2014-06-18 | Discharge: 2014-06-18 | Disposition: A | Discharge status: Home / Self Care | Payer: No Typology Code available for payment source | Source: Ambulatory Visit | Attending: Nephrology | Admitting: Nephrology

## 2014-06-18 DIAGNOSIS — D638 Anemia in other chronic diseases classified elsewhere: Secondary | ICD-10-CM | POA: Insufficient documentation

## 2014-06-18 DIAGNOSIS — N182 Chronic kidney disease, stage 2 (mild): Secondary | ICD-10-CM | POA: Diagnosis not present

## 2014-06-18 DIAGNOSIS — Z940 Kidney transplant status: Secondary | ICD-10-CM | POA: Diagnosis not present

## 2014-06-18 LAB — RENAL FUNCTION PANEL
Albumin: 3.9 g/dL (ref 3.5–5.2)
Anion gap: 11 (ref 5–15)
BUN: 31 mg/dL — AB (ref 6–23)
CHLORIDE: 105 meq/L (ref 96–112)
CO2: 22 meq/L (ref 19–32)
Calcium: 9.4 mg/dL (ref 8.4–10.5)
Creatinine, Ser: 1.36 mg/dL — ABNORMAL HIGH (ref 0.50–1.35)
GFR calc Af Amer: 55 mL/min — ABNORMAL LOW (ref >90)
GFR, EST NON AFRICAN AMERICAN: 48 mL/min — AB (ref >90)
Glucose, Bld: 171 mg/dL — ABNORMAL HIGH (ref 70–99)
Phosphorus: 3.3 mg/dL (ref 2.3–4.6)
Potassium: 4.9 mEq/L (ref 3.7–5.3)
Sodium: 138 mEq/L (ref 137–147)

## 2014-06-18 LAB — IRON AND TIBC
IRON: 104 ug/dL (ref 42–135)
SATURATION RATIOS: 55 % (ref 20–55)
TIBC: 188 ug/dL — ABNORMAL LOW (ref 215–435)
UIBC: 84 ug/dL — AB (ref 125–400)

## 2014-06-18 LAB — CBC
HCT: 31.4 % — ABNORMAL LOW (ref 39.0–52.0)
Hemoglobin: 10.7 g/dL — ABNORMAL LOW (ref 13.0–17.0)
MCH: 26 pg (ref 26.0–34.0)
MCHC: 34.1 g/dL (ref 30.0–36.0)
MCV: 76.4 fL — AB (ref 78.0–100.0)
PLATELETS: 154 10*3/uL (ref 150–400)
RBC: 4.11 MIL/uL — AB (ref 4.22–5.81)
RDW: 15.6 % — ABNORMAL HIGH (ref 11.5–15.5)
WBC: 5.8 10*3/uL (ref 4.0–10.5)

## 2014-06-18 LAB — FERRITIN: Ferritin: 678 ng/mL — ABNORMAL HIGH (ref 22–322)

## 2014-06-18 MED ORDER — DARBEPOETIN ALFA 100 MCG/0.5ML IJ SOSY
PREFILLED_SYRINGE | INTRAMUSCULAR | Status: AC
Start: 2014-06-18 — End: 2014-06-18
  Filled 2014-06-18: qty 0.5

## 2014-06-18 MED ORDER — DARBEPOETIN ALFA 100 MCG/0.5ML IJ SOSY
100.0000 ug | PREFILLED_SYRINGE | INTRAMUSCULAR | Status: DC
  Administered 2014-06-18: 100 ug via SUBCUTANEOUS

## 2014-06-21 LAB — TACROLIMUS LEVEL: TACROLIMUS (FK506) - LABCORP: 5 ng/mL

## 2014-07-13 ENCOUNTER — Inpatient Hospital Stay (HOSPITAL_COMMUNITY): Admission: RE | Admit: 2014-07-13 | Payer: Medicare Other | Source: Ambulatory Visit

## 2014-07-14 ENCOUNTER — Ambulatory Visit: Payer: Medicare Other | Admitting: Podiatry

## 2014-07-15 ENCOUNTER — Emergency Department (HOSPITAL_COMMUNITY): Payer: Medicare Other

## 2014-07-15 ENCOUNTER — Encounter (HOSPITAL_COMMUNITY): Payer: Self-pay | Admitting: Cardiology

## 2014-07-15 ENCOUNTER — Encounter (HOSPITAL_COMMUNITY): Payer: Medicare Other

## 2014-07-15 ENCOUNTER — Emergency Department (HOSPITAL_COMMUNITY)
Admission: EM | Admit: 2014-07-15 | Discharge: 2014-07-15 | Disposition: A | Discharge status: Home / Self Care | Payer: Medicare Other | Attending: Emergency Medicine | Admitting: Emergency Medicine

## 2014-07-15 DIAGNOSIS — M542 Cervicalgia: Secondary | ICD-10-CM | POA: Diagnosis not present

## 2014-07-15 DIAGNOSIS — K219 Gastro-esophageal reflux disease without esophagitis: Secondary | ICD-10-CM | POA: Diagnosis not present

## 2014-07-15 DIAGNOSIS — R1310 Dysphagia, unspecified: Secondary | ICD-10-CM

## 2014-07-15 DIAGNOSIS — N390 Urinary tract infection, site not specified: Secondary | ICD-10-CM | POA: Diagnosis not present

## 2014-07-15 DIAGNOSIS — Z992 Dependence on renal dialysis: Secondary | ICD-10-CM | POA: Diagnosis not present

## 2014-07-15 DIAGNOSIS — Z794 Long term (current) use of insulin: Secondary | ICD-10-CM | POA: Insufficient documentation

## 2014-07-15 DIAGNOSIS — Z8546 Personal history of malignant neoplasm of prostate: Secondary | ICD-10-CM | POA: Insufficient documentation

## 2014-07-15 DIAGNOSIS — R011 Cardiac murmur, unspecified: Secondary | ICD-10-CM | POA: Insufficient documentation

## 2014-07-15 DIAGNOSIS — E119 Type 2 diabetes mellitus without complications: Secondary | ICD-10-CM | POA: Diagnosis not present

## 2014-07-15 DIAGNOSIS — Z79899 Other long term (current) drug therapy: Secondary | ICD-10-CM | POA: Diagnosis not present

## 2014-07-15 DIAGNOSIS — N186 End stage renal disease: Secondary | ICD-10-CM | POA: Insufficient documentation

## 2014-07-15 DIAGNOSIS — Z940 Kidney transplant status: Secondary | ICD-10-CM | POA: Diagnosis not present

## 2014-07-15 DIAGNOSIS — Z7982 Long term (current) use of aspirin: Secondary | ICD-10-CM | POA: Diagnosis not present

## 2014-07-15 DIAGNOSIS — Z8673 Personal history of transient ischemic attack (TIA), and cerebral infarction without residual deficits: Secondary | ICD-10-CM | POA: Diagnosis not present

## 2014-07-15 DIAGNOSIS — Z8701 Personal history of pneumonia (recurrent): Secondary | ICD-10-CM | POA: Diagnosis not present

## 2014-07-15 DIAGNOSIS — Z87891 Personal history of nicotine dependence: Secondary | ICD-10-CM | POA: Diagnosis not present

## 2014-07-15 DIAGNOSIS — Z87440 Personal history of urinary (tract) infections: Secondary | ICD-10-CM | POA: Insufficient documentation

## 2014-07-15 DIAGNOSIS — M549 Dorsalgia, unspecified: Secondary | ICD-10-CM

## 2014-07-15 LAB — COMPREHENSIVE METABOLIC PANEL
ALBUMIN: 4.3 g/dL (ref 3.5–5.2)
ALT: 17 U/L (ref 0–53)
AST: 17 U/L (ref 0–37)
Alkaline Phosphatase: 60 U/L (ref 39–117)
Anion gap: 12 (ref 5–15)
BUN: 40 mg/dL — ABNORMAL HIGH (ref 6–23)
CALCIUM: 9.8 mg/dL (ref 8.4–10.5)
CO2: 23 mEq/L (ref 19–32)
Chloride: 102 mEq/L (ref 96–112)
Creatinine, Ser: 1.48 mg/dL — ABNORMAL HIGH (ref 0.50–1.35)
GFR calc Af Amer: 50 mL/min — ABNORMAL LOW (ref >90)
GFR, EST NON AFRICAN AMERICAN: 43 mL/min — AB (ref >90)
Glucose, Bld: 98 mg/dL (ref 70–99)
Potassium: 5.5 mEq/L — ABNORMAL HIGH (ref 3.7–5.3)
Sodium: 137 mEq/L (ref 137–147)
TOTAL PROTEIN: 8 g/dL (ref 6.0–8.3)
Total Bilirubin: 0.3 mg/dL (ref 0.3–1.2)

## 2014-07-15 LAB — URINALYSIS, ROUTINE W REFLEX MICROSCOPIC
Bilirubin Urine: NEGATIVE
GLUCOSE, UA: NEGATIVE mg/dL
Hgb urine dipstick: NEGATIVE
Ketones, ur: NEGATIVE mg/dL
Nitrite: NEGATIVE
PROTEIN: NEGATIVE mg/dL
SPECIFIC GRAVITY, URINE: 1.014 (ref 1.005–1.030)
Urobilinogen, UA: 0.2 mg/dL (ref 0.0–1.0)
pH: 6.5 (ref 5.0–8.0)

## 2014-07-15 LAB — CBC WITH DIFFERENTIAL/PLATELET
BASOS ABS: 0 10*3/uL (ref 0.0–0.1)
BASOS PCT: 0 % (ref 0–1)
EOS PCT: 2 % (ref 0–5)
Eosinophils Absolute: 0.1 10*3/uL (ref 0.0–0.7)
HEMATOCRIT: 32.3 % — AB (ref 39.0–52.0)
Hemoglobin: 10.7 g/dL — ABNORMAL LOW (ref 13.0–17.0)
Lymphocytes Relative: 18 % (ref 12–46)
Lymphs Abs: 1.2 10*3/uL (ref 0.7–4.0)
MCH: 26 pg (ref 26.0–34.0)
MCHC: 33.1 g/dL (ref 30.0–36.0)
MCV: 78.4 fL (ref 78.0–100.0)
Monocytes Absolute: 0.4 10*3/uL (ref 0.1–1.0)
Monocytes Relative: 7 % (ref 3–12)
Neutro Abs: 4.8 10*3/uL (ref 1.7–7.7)
Neutrophils Relative %: 73 % (ref 43–77)
PLATELETS: 195 10*3/uL (ref 150–400)
RBC: 4.12 MIL/uL — ABNORMAL LOW (ref 4.22–5.81)
RDW: 15.7 % — AB (ref 11.5–15.5)
WBC: 6.5 10*3/uL (ref 4.0–10.5)

## 2014-07-15 LAB — TROPONIN I: Troponin I: <0.3 ng/mL (ref <0.30)

## 2014-07-15 LAB — URINE MICROSCOPIC-ADD ON

## 2014-07-15 LAB — PROTIME-INR
INR: 1.07 (ref 0.00–1.49)
Prothrombin Time: 14 seconds (ref 11.6–15.2)

## 2014-07-15 MED ORDER — OXYCODONE-ACETAMINOPHEN 5-325 MG PO TABS
1.0000 | ORAL_TABLET | Freq: Once | ORAL | Status: AC
  Administered 2014-07-15: 1 via ORAL
  Filled 2014-07-15: qty 1

## 2014-07-15 MED ORDER — DOCUSATE SODIUM 100 MG PO CAPS: 100.0000 mg | ORAL_CAPSULE | Freq: Two times a day (BID) | ORAL | Status: DC | PRN

## 2014-07-15 MED ORDER — CEPHALEXIN 500 MG PO CAPS: 500.0000 mg | ORAL_CAPSULE | Freq: Three times a day (TID) | ORAL | Status: AC

## 2014-07-15 MED ORDER — CEPHALEXIN 250 MG PO CAPS
500.0000 mg | ORAL_CAPSULE | Freq: Once | ORAL | Status: AC
  Administered 2014-07-15: 500 mg via ORAL
  Filled 2014-07-15: qty 2

## 2014-07-15 MED ORDER — OXYCODONE-ACETAMINOPHEN 5-325 MG PO TABS: 1.0000 | ORAL_TABLET | Freq: Three times a day (TID) | ORAL | Status: DC | PRN

## 2014-07-15 NOTE — ED Notes (Signed)
Returned from xray

## 2014-07-15 NOTE — ED Notes (Signed)
Pt reports that neck pain for the past 4 days with increasing pain today. States that he has been having a hard time swallowing food and feels like his throat is tight. Denies any chest pain or SOB.

## 2014-07-15 NOTE — ED Provider Notes (Signed)
CSN: [id number]     Arrival date & time 07/15/14  1536 History   First MD Initiated Contact with Patient 07/15/14 1625     Chief Complaint  Patient presents with  . Neck Pain     (Consider location/radiation/quality/duration/timing/severity/associated sxs/prior Treatment) Patient is a 80 y.o. male presenting with neck pain. The history is provided by the patient.  Neck Pain Pain location:  R side Quality:  Aching Radiates to: occiput. Pain severity:  Moderate Pain is:  Same all the time Onset quality:  Gradual Duration:  4 days Timing:  Constant Progression:  Worsening Chronicity:  Chronic Context comment:  Sleeping in a recliner more recently. Relieved by:  NSAIDs Worsened by:  Nothing tried Ineffective treatments:  Muscle relaxants Associated symptoms: no chest pain, no fever, no headaches and no numbness     Past Medical History  Diagnosis Date  . H/O kidney transplant     End stage renal disease  . Diabetes mellitus   . Cancer     hx prostate cancer  . ESRD (end stage renal disease) 2011    hemodialysis started Sept 2011, cadaveric (double) renal  transplant Mar 2012 at [facility]  . Stroke   . GERD (gastroesophageal reflux disease)   . Hypertension   . Dysrhythmia     hx of atrial fibrilation  . Shortness of breath     " sometimes "  . Pneumonia 04/08/2012  . Heart murmur   . Arthritis     hands  . UTI (urinary tract infection)    Past Surgical History  Procedure Laterality Date  . Kidney transplant    . Prostate surgery  approx. 2003    seed implant   Family History  Problem Relation Age of Onset  . Colon cancer Mother    History  Substance Use Topics  . Smoking status: Former Smoker -- 15 years    Types: Cigarettes    Quit date: 01/24/1983  . Smokeless tobacco: Never Used  . Alcohol Use: No    Review of Systems  Constitutional: Negative for fever.       Neck pain  HENT: Negative for drooling and rhinorrhea.   Eyes: Negative for pain.   Respiratory: Negative for cough and shortness of breath.   Cardiovascular: Negative for chest pain and leg swelling.  Gastrointestinal: Negative for nausea, vomiting, abdominal pain and diarrhea.  Genitourinary: Negative for dysuria and hematuria.  Musculoskeletal: Negative for gait problem and neck pain.  Skin: Negative for color change.  Neurological: Negative for numbness and headaches.  Hematological: Negative for adenopathy.  Psychiatric/Behavioral: Negative for behavioral problems.  All other systems reviewed and are negative.     Allergies  Review of patient's allergies indicates no known allergies.  Home Medications   Prior to Admission medications   Medication Sig Start Date End Date Taking? Authorizing Provider  aspirin 81 MG tablet Take 81 mg by mouth daily.    Historical Provider, MD  baclofen (LIORESAL) 20 MG tablet Take 20 mg by mouth at bedtime. For hiccups    Historical Provider, MD  calcium carbonate (TUMS EX) 750 MG chewable tablet Chew 1 tablet by mouth 3 (three) times daily.    Historical Provider, MD  dextrose (GLUTOSE) 40 % GEL Take 37.5 g by mouth as needed for low blood sugar (CBG less than 70 or CBG greater than 70 and next meal is more than 1 hours away). 07/22/13   Janece Canterbury, MD  diltiazem (TIAZAC) 300 MG 24 hr capsule  Take 300 mg by mouth daily.     Historical Provider, MD  diphenhydramine-acetaminophen (TYLENOL PM EXTRA STRENGTH) 25-500 MG TABS Take 2 tablets by mouth at bedtime as needed (sleep).     Historical Provider, MD  gabapentin (NEURONTIN) 100 MG capsule Take 100 mg by mouth 3 (three) times daily. Take 2 pills 3 times a day    Historical Provider, MD  glipiZIDE (GLUCOTROL) 5 MG tablet Take by mouth daily before breakfast.    Historical Provider, MD  guaiFENesin (MUCINEX) 600 MG 12 hr tablet Take 600 mg by mouth 2 (two) times daily as needed for cough.    Historical Provider, MD  hydrALAZINE (APRESOLINE) 100 MG tablet Take 50 mg by mouth 2  (two) times daily. 1/2 tab twice a day    Historical Provider, MD  insulin aspart (NOVOLOG) 100 UNIT/ML injection Inject 5-10 Units into the skin 3 (three) times daily before meals. cbg 200-300 5 units; 301-400 8 units; 401-500 10 units 07/22/13   Janece Canterbury, MD  loratadine (CLARITIN) 10 MG tablet Take 10 mg by mouth daily as needed. allergies    Historical Provider, MD  megestrol (MEGACE) 400 MG/10ML suspension Take 400 mg by mouth daily as needed. For appetite    Historical Provider, MD  mycophenolate (MYFORTIC) 180 MG EC tablet Take 180 mg by mouth 2 (two) times daily.    Historical Provider, MD  polyethylene glycol (MIRALAX / GLYCOLAX) packet Take 17 g by mouth daily as needed. constipation    Historical Provider, MD  ranitidine (ZANTAC) 300 MG capsule Take 300 mg by mouth daily.    Historical Provider, MD  sodium bicarbonate 650 MG tablet Take 1,300 mg by mouth 2 (two) times daily.    Historical Provider, MD  tacrolimus (PROGRAF) 5 MG capsule Take 20 mg by mouth 2 (two) times daily. (4 TABLETS TWICE DAILY)    Historical Provider, MD   BP 153/65 mmHg  Pulse 86  Temp(Src) 98.1 F (36.7 C) (Oral)  Resp 21  Ht 5\' 8"  (1.727 m)  Wt 180 lb (81.647 kg)  BMI 27.38 kg/m2  SpO2 100% Physical Exam  Constitutional: He is oriented to person, place, and time. He appears well-developed and well-nourished.  HENT:  Head: Normocephalic and atraumatic.  Right Ear: External ear normal.  Left Ear: External ear normal.  Nose: Nose normal.  Mouth/Throat: Oropharynx is clear and moist. No oropharyngeal exudate.  Eyes: Conjunctivae and EOM are normal. Pupils are equal, round, and reactive to light.  Neck:  The posterior muscles of the neck appear tense. There is no focal tenderness of the cervical spine. Mild upper thoracic spine tenderness to palpation.  The patient does have range of motion of his head to the left and right which appears to be limited.  Neck positioning is severely kyphotic and to  the right.   Cardiovascular: Normal rate, regular rhythm and intact distal pulses.  Exam reveals no gallop and no friction rub.   Murmur heard. Pulmonary/Chest: Effort normal and breath sounds normal. No respiratory distress. He has no wheezes.  Abdominal: Soft. Bowel sounds are normal. He exhibits no distension. There is no tenderness. There is no rebound and no guarding.  Musculoskeletal: Normal range of motion. He exhibits no edema or tenderness.  Neurological: He is alert and oriented to person, place, and time.  alert, oriented x3 speech: normal in context and clarity memory: intact grossly cranial nerves II-XII: intact motor strength: 3/5 in RLE, 4/5 in bilateral UE's, 5/5 in LLE Tremor  noted in hands bilaterally sensation: intact to light touch diffusely  cerebellar: finger-to-nose intact gait: pt ambulates w/ a walker at baseline, deferred gait testing  Skin: Skin is warm and dry.  Psychiatric: He has a normal mood and affect. His behavior is normal.  Nursing note and vitals reviewed.   ED Course  Procedures (including critical care time) Labs Review Labs Reviewed  CBC WITH DIFFERENTIAL - Abnormal; Notable for the following:    RBC 4.12 (*)    Hemoglobin 10.7 (*)    HCT 32.3 (*)    RDW 15.7 (*)    All other components within normal limits  COMPREHENSIVE METABOLIC PANEL - Abnormal; Notable for the following:    Potassium 5.5 (*)    BUN 40 (*)    Creatinine, Ser 1.48 (*)    GFR calc non Af Amer 43 (*)    GFR calc Af Amer 50 (*)    All other components within normal limits  URINALYSIS, ROUTINE W REFLEX MICROSCOPIC - Abnormal; Notable for the following:    Leukocytes, UA SMALL (*)    All other components within normal limits  URINE MICROSCOPIC-ADD ON - Abnormal; Notable for the following:    Bacteria, UA MANY (*)    All other components within normal limits  URINE CULTURE  PROTIME-INR  TROPONIN I    Imaging Review Dg Thoracic Spine 2 View  07/15/2014   CLINICAL  DATA:  Mid upper back pain for 4 days.  No known injury.  EXAM: THORACIC SPINE - 2 VIEW  COMPARISON:  Chest x-ray 07/17/2013  FINDINGS: Normal alignment of the lumbar vertebral bodies. No acute fracture is identified. Mild to moderate degenerative changes. No abnormal paraspinal soft tissue swelling. The visualized posterior ribs are intact. Extensive costochondral cartilage calcifications. Distended air-filled small bowel loops noted in the upper abdomen. This could suggest an ileus or small-bowel obstruction. Recommend correlation with clinical findings.  IMPRESSION: Normal alignment and no acute thoracic spine fracture.  Distended appearing air-filled bowel loops in the upper abdomen.   Electronically Signed   By: Kalman Jewels M.D.   On: 07/15/2014 19:07   Ct Head Wo Contrast  07/15/2014   CLINICAL DATA:  Initial encounter. Headache and neck pain for 4 days.  EXAM: CT HEAD WITHOUT CONTRAST  CT CERVICAL SPINE WITHOUT CONTRAST  TECHNIQUE: Multidetector CT imaging of the head and cervical spine was performed following the standard protocol without intravenous contrast. Multiplanar CT image reconstructions of the cervical spine were also generated.  COMPARISON:  None.  FINDINGS: CT HEAD FINDINGS  No mass lesion, mass effect, midline shift, hydrocephalus, hemorrhage. No acute territorial cortical ischemia/infarct. Atrophy and chronic ischemic white matter disease is present. Scan angles are nonstandard due to cervical kyphosis. Calvarium intact. Tiny amount of RIGHT mastoid fluid. Paranasal sinuses are within normal limits. LEFT nasal septal spur.  CT CERVICAL SPINE FINDINGS  Alignment: Reversal of the normal cervical lordosis with kyphosis of the cervicothoracic spine.  Craniocervical junction: The odontoid is intact. Both occipital condyles also appear intact. C1 ring appears intact. Rotation of the head produces asymmetry at the atlantoaxial junction.  Vertebrae: Negative for fracture.  Central canal:  Grossly patent.  Paraspinal soft tissues: Prevertebral soft tissues are normal.  Lung apices: Normal.  Patulous thoracic esophagus. Carotid atherosclerosis. Ankylosis at C5-C6.  IMPRESSION: 1. Atrophy and chronic ischemic white matter disease. 2. Cervical kyphosis. 3. No acute cervical spine abnormality.   Electronically Signed   By: Dereck Ligas M.D.   On: 07/15/2014  19:29   Ct Cervical Spine Wo Contrast  07/15/2014   CLINICAL DATA:  Initial encounter. Headache and neck pain for 4 days.  EXAM: CT HEAD WITHOUT CONTRAST  CT CERVICAL SPINE WITHOUT CONTRAST  TECHNIQUE: Multidetector CT imaging of the head and cervical spine was performed following the standard protocol without intravenous contrast. Multiplanar CT image reconstructions of the cervical spine were also generated.  COMPARISON:  None.  FINDINGS: CT HEAD FINDINGS  No mass lesion, mass effect, midline shift, hydrocephalus, hemorrhage. No acute territorial cortical ischemia/infarct. Atrophy and chronic ischemic white matter disease is present. Scan angles are nonstandard due to cervical kyphosis. Calvarium intact. Tiny amount of RIGHT mastoid fluid. Paranasal sinuses are within normal limits. LEFT nasal septal spur.  CT CERVICAL SPINE FINDINGS  Alignment: Reversal of the normal cervical lordosis with kyphosis of the cervicothoracic spine.  Craniocervical junction: The odontoid is intact. Both occipital condyles also appear intact. C1 ring appears intact. Rotation of the head produces asymmetry at the atlantoaxial junction.  Vertebrae: Negative for fracture.  Central canal: Grossly patent.  Paraspinal soft tissues: Prevertebral soft tissues are normal.  Lung apices: Normal.  Patulous thoracic esophagus. Carotid atherosclerosis. Ankylosis at C5-C6.  IMPRESSION: 1. Atrophy and chronic ischemic white matter disease. 2. Cervical kyphosis. 3. No acute cervical spine abnormality.   Electronically Signed   By: Dereck Ligas M.D.   On: 07/15/2014 19:29      EKG Interpretation   Date/Time:  Thursday July 15 2014 17:14:48 EST Ventricular Rate:  67 PR Interval:  227 QRS Duration: 102 QT Interval:  412 QTC Calculation: 435 R Axis:   4 Text Interpretation:  Sinus rhythm Prolonged PR interval Baseline wander  in lead(s) V2 V5 More prominent t wave inversions in V5-V6 as compared to  last tracing Confirmed by Madox Corkins  MD, Yann Biehn ([id number]) on 07/15/2014  5:18:57 PM      MDM   Final diagnoses:  Difficulty swallowing  Neck pain  Upper back pain  UTI (lower urinary tract infection)    5:08 PM 80 y.o. male w hx of DM, CVA w/ right sided weakness, GERD, HTN, ESRD s/p kidney txplt on prograf who presents with neck pain. He is here with his caretaker who stays with him. She states that he has been falling asleep sitting up in a recliner frequently. He has no pillows to support his head. She notes that this has worsened over the last 5 years since he had a stroke. She states that he has been becoming increasingly kyphotic and complaining of neck pain. It seems particularly worse over the last 4 days. He had typically taken Tylenol as needed for pain but had been trying a muscle relaxant without relief. He has not had any pain control today. He describes a chronic right sided neck pain radiating to the right occipital area of his head. The caretaker denies any fevers. He is afebrile and vital signs are unremarkable here. Likely pain due to chronically worsening kyphosis with no head support when he is falling asleep sitting up in a recliner. Ddx also includes torticollis but I would have expected muscle relaxants to provide more relief. Will get pain control screening labs and imaging. The patient is also concerned about difficulty swallowing. He states this is a new development over the last couple of months. His caretaker notes he has had no problem eating for her.   RN swallow screen was normal. Do not think swallowing issue is related to CVA. Will rec  pt f/u  w/ pcp for this.   8:54 PM: UA w/ small leuk and many bact, will cover for UTI. Only mildly elev K w/ Cr at baseline. Labs/imaging otherwise non-contrib.  I have discussed the diagnosis/risks/treatment options with the patient and caregiver and believe the pt to be eligible for discharge home to follow-up with his pcp next week. Will prescribe stronger pain medicine for his neck pain in the short term, also colace to prevent constipation. We also discussed returning to the ED immediately if new or worsening sx occur. We discussed the sx which are most concerning (e.g., fever, worsening pain) that necessitate immediate return. Medications administered to the patient during their visit and any new prescriptions provided to the patient are listed below.  Medications given during this visit Medications  oxyCODONE-acetaminophen (PERCOCET/ROXICET) 5-325 MG per tablet 1 tablet (1 tablet Oral Given 07/15/14 1722)  cephALEXin (KEFLEX) capsule 500 mg (500 mg Oral Given 07/15/14 2053)    New Prescriptions   CEPHALEXIN (KEFLEX) 500 MG CAPSULE    Take 1 capsule (500 mg total) by mouth 3 (three) times daily.   DOCUSATE SODIUM (COLACE) 100 MG CAPSULE    Take 1 capsule (100 mg total) by mouth 2 (two) times daily as needed for mild constipation.   OXYCODONE-ACETAMINOPHEN (PERCOCET) 5-325 MG PER TABLET    Take 1 tablet by mouth every 8 (eight) hours as needed for moderate pain.     Pamella Pert, MD 07/16/14 0001

## 2014-07-16 ENCOUNTER — Encounter (HOSPITAL_COMMUNITY)
Admission: RE | Admit: 2014-07-16 | Discharge: 2014-07-16 | Disposition: A | Discharge status: Home / Self Care | Payer: Medicare Other | Source: Ambulatory Visit | Attending: Nephrology | Admitting: Nephrology

## 2014-07-16 DIAGNOSIS — D509 Iron deficiency anemia, unspecified: Secondary | ICD-10-CM | POA: Insufficient documentation

## 2014-07-16 LAB — URINE CULTURE
COLONY COUNT: NO GROWTH
Culture: NO GROWTH

## 2014-07-16 LAB — CBC
HEMATOCRIT: 33 % — AB (ref 39.0–52.0)
HEMOGLOBIN: 10.9 g/dL — AB (ref 13.0–17.0)
MCH: 26 pg (ref 26.0–34.0)
MCHC: 33 g/dL (ref 30.0–36.0)
MCV: 78.8 fL (ref 78.0–100.0)
Platelets: 209 10*3/uL (ref 150–400)
RBC: 4.19 MIL/uL — ABNORMAL LOW (ref 4.22–5.81)
RDW: 15.8 % — ABNORMAL HIGH (ref 11.5–15.5)
WBC: 6.2 10*3/uL (ref 4.0–10.5)

## 2014-07-16 LAB — RENAL FUNCTION PANEL
ALBUMIN: 4.3 g/dL (ref 3.5–5.2)
Anion gap: 12 (ref 5–15)
BUN: 41 mg/dL — AB (ref 6–23)
CO2: 23 mEq/L (ref 19–32)
Calcium: 9.7 mg/dL (ref 8.4–10.5)
Chloride: 102 mEq/L (ref 96–112)
Creatinine, Ser: 1.64 mg/dL — ABNORMAL HIGH (ref 0.50–1.35)
GFR calc Af Amer: 44 mL/min — ABNORMAL LOW (ref >90)
GFR calc non Af Amer: 38 mL/min — ABNORMAL LOW (ref >90)
GLUCOSE: 182 mg/dL — AB (ref 70–99)
PHOSPHORUS: 4 mg/dL (ref 2.3–4.6)
Potassium: 5.4 mEq/L — ABNORMAL HIGH (ref 3.7–5.3)
Sodium: 137 mEq/L (ref 137–147)

## 2014-07-16 LAB — IRON AND TIBC
Iron: 87 ug/dL (ref 42–135)
Saturation Ratios: 42 % (ref 20–55)
TIBC: 205 ug/dL — ABNORMAL LOW (ref 215–435)
UIBC: 118 ug/dL — ABNORMAL LOW (ref 125–400)

## 2014-07-16 LAB — FERRITIN: Ferritin: 803 ng/mL — ABNORMAL HIGH (ref 22–322)

## 2014-07-16 MED ORDER — DARBEPOETIN ALFA 100 MCG/0.5ML IJ SOSY
100.0000 ug | PREFILLED_SYRINGE | INTRAMUSCULAR | Status: DC
  Administered 2014-07-16: 100 ug via SUBCUTANEOUS

## 2014-07-16 MED ORDER — DARBEPOETIN ALFA 100 MCG/0.5ML IJ SOSY
PREFILLED_SYRINGE | INTRAMUSCULAR | Status: AC
  Filled 2014-07-16: qty 0.5

## 2014-07-20 LAB — TACROLIMUS LEVEL: Tacrolimus (FK506) - LabCorp: 6.9 ng/mL (ref 2.0–20.0)

## 2014-07-28 ENCOUNTER — Ambulatory Visit: Payer: Medicare Other | Admitting: Podiatry

## 2014-08-11 ENCOUNTER — Encounter: Payer: Self-pay | Admitting: Podiatry

## 2014-08-11 ENCOUNTER — Ambulatory Visit (INDEPENDENT_AMBULATORY_CARE_PROVIDER_SITE_OTHER): Payer: Medicare Other | Admitting: Podiatry

## 2014-08-11 DIAGNOSIS — M79676 Pain in unspecified toe(s): Secondary | ICD-10-CM

## 2014-08-11 DIAGNOSIS — B351 Tinea unguium: Secondary | ICD-10-CM

## 2014-08-11 NOTE — Progress Notes (Signed)
Patient ID: Evan [name], male   DOB: [birth year], 81 y.o.   MRN: [id number] Patient transfers from wheelchair to treatment chair   Subjective: This patient presents again complaining of painful toenails. His caregiver is present in the treatment room today.  Objective: Patient is orientated 3 Patient transfers from wheelchair to treatment chair The toenails are brittle, hypertrophic, deformed, incurvated, discolored and tender to palpation 6-10  Assessment: Symptomatic onychomycosis 6-10  Plan: Debridement of toenails 10 without a bleeding  Reappoint 3 months

## 2014-08-13 ENCOUNTER — Encounter (HOSPITAL_COMMUNITY)
Admission: RE | Admit: 2014-08-13 | Discharge: 2014-08-13 | Disposition: A | Discharge status: Home / Self Care | Payer: Medicare Other | Source: Ambulatory Visit | Attending: Nephrology | Admitting: Nephrology

## 2014-08-13 DIAGNOSIS — D638 Anemia in other chronic diseases classified elsewhere: Secondary | ICD-10-CM | POA: Insufficient documentation

## 2014-08-13 DIAGNOSIS — Z940 Kidney transplant status: Secondary | ICD-10-CM | POA: Insufficient documentation

## 2014-08-13 DIAGNOSIS — D509 Iron deficiency anemia, unspecified: Secondary | ICD-10-CM | POA: Diagnosis present

## 2014-08-13 DIAGNOSIS — N182 Chronic kidney disease, stage 2 (mild): Secondary | ICD-10-CM | POA: Insufficient documentation

## 2014-08-13 LAB — RENAL FUNCTION PANEL
ALBUMIN: 4.1 g/dL (ref 3.5–5.2)
ANION GAP: 4 — AB (ref 5–15)
BUN: 32 mg/dL — ABNORMAL HIGH (ref 6–23)
CALCIUM: 9.3 mg/dL (ref 8.4–10.5)
CHLORIDE: 110 meq/L (ref 96–112)
CO2: 26 mmol/L (ref 19–32)
CREATININE: 1.5 mg/dL — AB (ref 0.50–1.35)
GFR calc Af Amer: 49 mL/min — ABNORMAL LOW (ref >90)
GFR calc non Af Amer: 42 mL/min — ABNORMAL LOW (ref >90)
GLUCOSE: 172 mg/dL — AB (ref 70–99)
PHOSPHORUS: 3.7 mg/dL (ref 2.3–4.6)
Potassium: 4.8 mmol/L (ref 3.5–5.1)
SODIUM: 140 mmol/L (ref 135–145)

## 2014-08-13 LAB — IRON AND TIBC
Iron: 91 ug/dL (ref 42–165)
Saturation Ratios: 51 % (ref 20–55)
TIBC: 177 ug/dL — ABNORMAL LOW (ref 215–435)
UIBC: 86 ug/dL — AB (ref 125–400)

## 2014-08-13 LAB — CBC
HCT: 32.9 % — ABNORMAL LOW (ref 39.0–52.0)
HEMOGLOBIN: 10.6 g/dL — AB (ref 13.0–17.0)
MCH: 26.5 pg (ref 26.0–34.0)
MCHC: 32.2 g/dL (ref 30.0–36.0)
MCV: 82.3 fL (ref 78.0–100.0)
PLATELETS: 174 10*3/uL (ref 150–400)
RBC: 4 MIL/uL — ABNORMAL LOW (ref 4.22–5.81)
RDW: 13.5 % (ref 11.5–15.5)
WBC: 5.1 10*3/uL (ref 4.0–10.5)

## 2014-08-13 LAB — FERRITIN: Ferritin: 680 ng/mL — ABNORMAL HIGH (ref 22–322)

## 2014-08-13 MED ORDER — DARBEPOETIN ALFA 100 MCG/0.5ML IJ SOSY
PREFILLED_SYRINGE | INTRAMUSCULAR | Status: AC
  Filled 2014-08-13: qty 0.5

## 2014-08-13 MED ORDER — DARBEPOETIN ALFA 100 MCG/0.5ML IJ SOSY
100.0000 ug | PREFILLED_SYRINGE | INTRAMUSCULAR | Status: DC
  Administered 2014-08-13: 100 ug via SUBCUTANEOUS

## 2014-08-16 LAB — TACROLIMUS LEVEL: Tacrolimus (FK506) - LabCorp: 5.9 ng/mL (ref 2.0–20.0)

## 2014-08-30 IMAGING — CT CT HEAD W/O CM
2 series · 16 of 30 positions shown, 20 images · non-contrast
Comparison: CT scan dated 03/25/2012

CLINICAL DATA: Hallucinations.  Prior strokes.

EXAM:
CT HEAD WITHOUT CONTRAST
TECHNIQUE: Contiguous axial images were obtained from the base of the skull
through the vertex without intravenous contrast.

[Series 3: head w/o · axial · non-contrast · 0.42mm/px · z∈[-300,-160]mm · 13 of 34 slices shown, 17 images]
[im 3/34  brain]
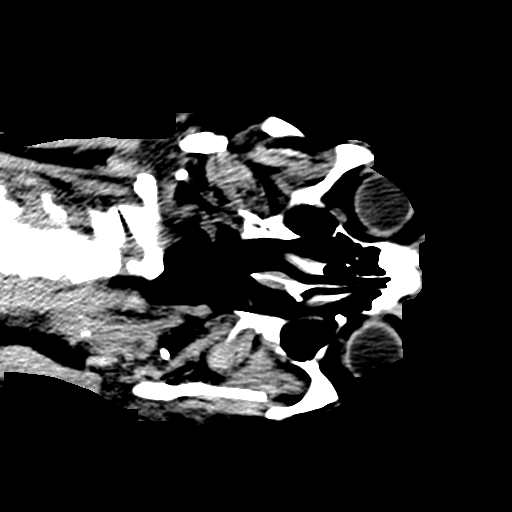
[im 3/34  bone]
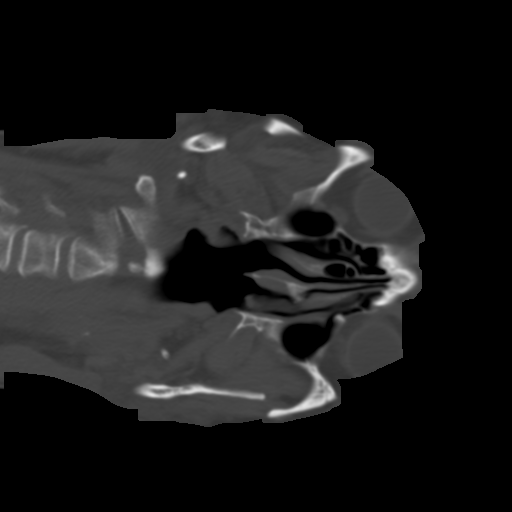
[im 5/34  brain]
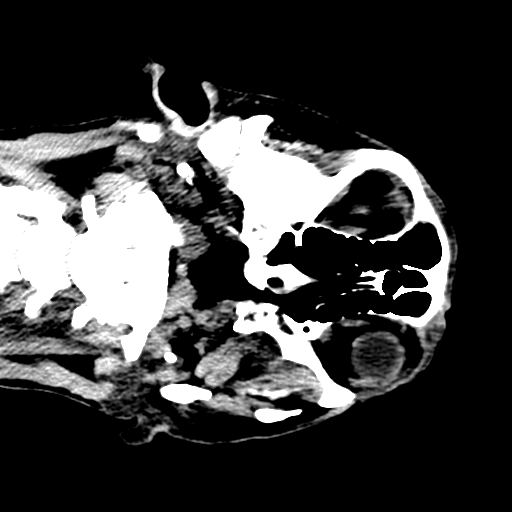
[im 8/34  brain]
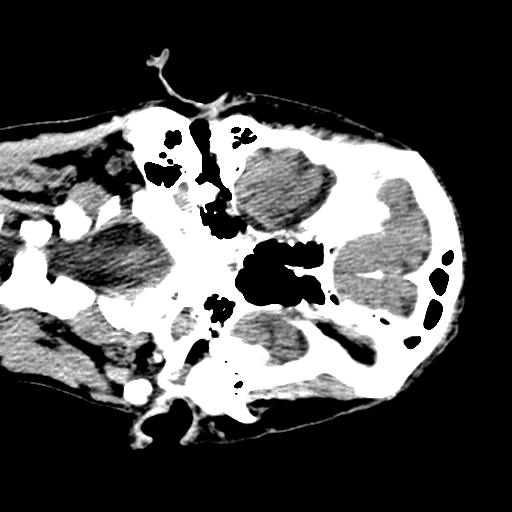
[im 10/34  brain]
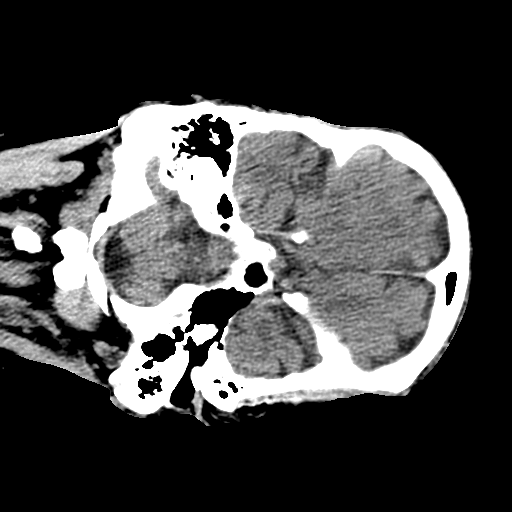
[im 12/34  brain]
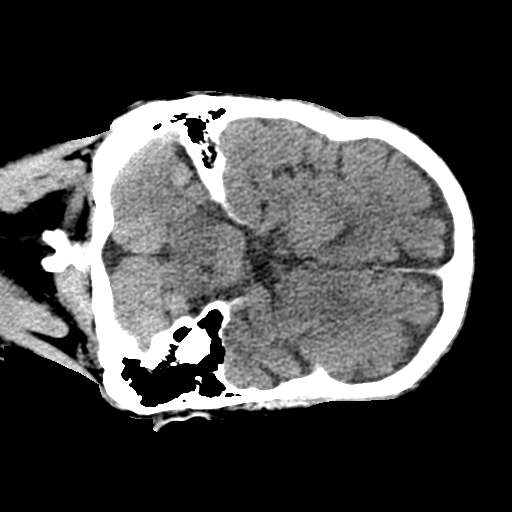
[im 12/34  bone]
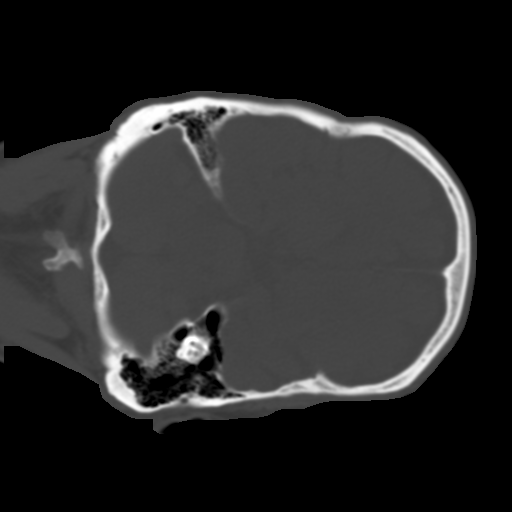
[im 15/34  brain]
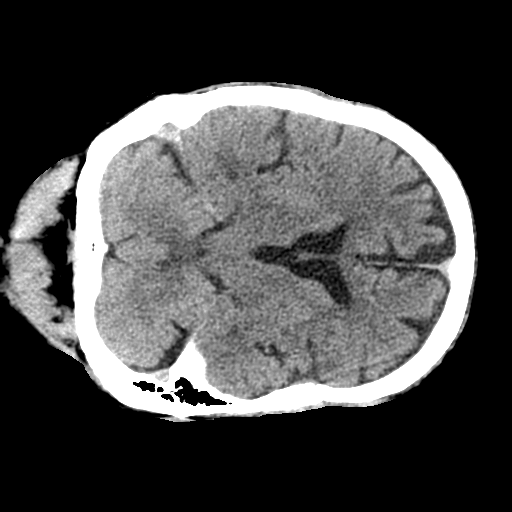
[im 17/34  brain]
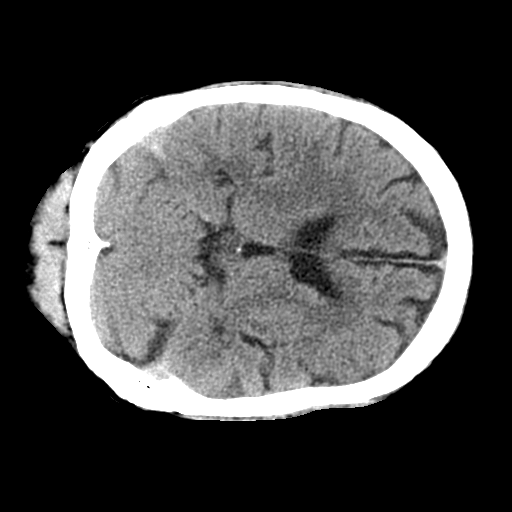
[im 19/34  brain]
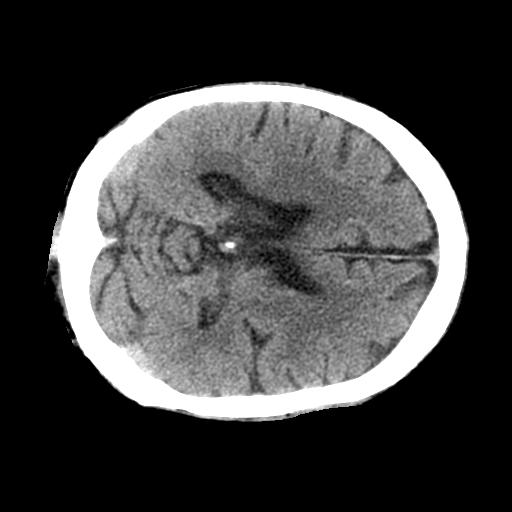
[im 22/34  brain]
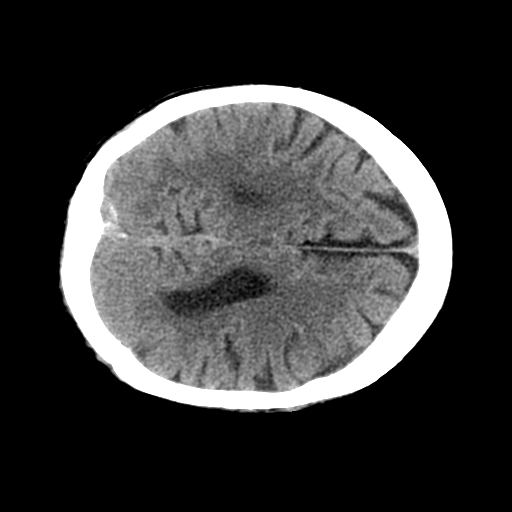
[im 22/34  bone]
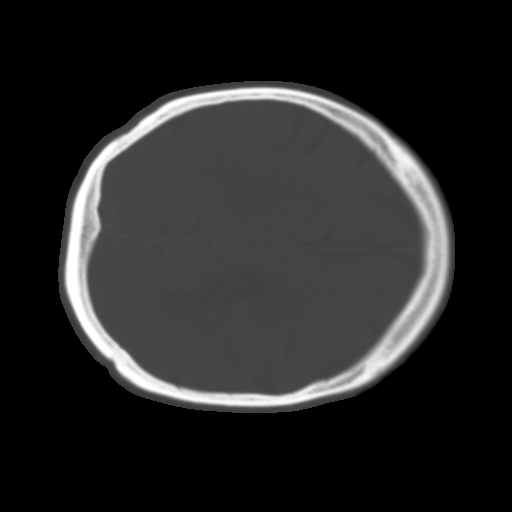
[im 24/34  brain]
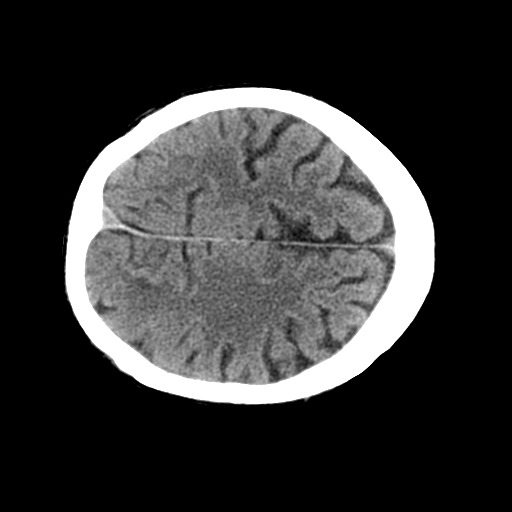
[im 26/34  brain]
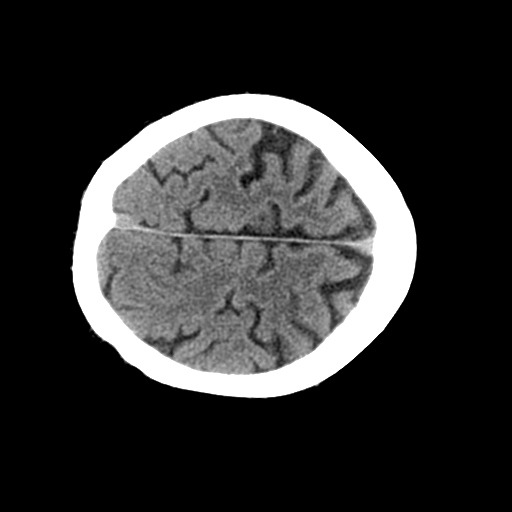
[im 29/34  brain]
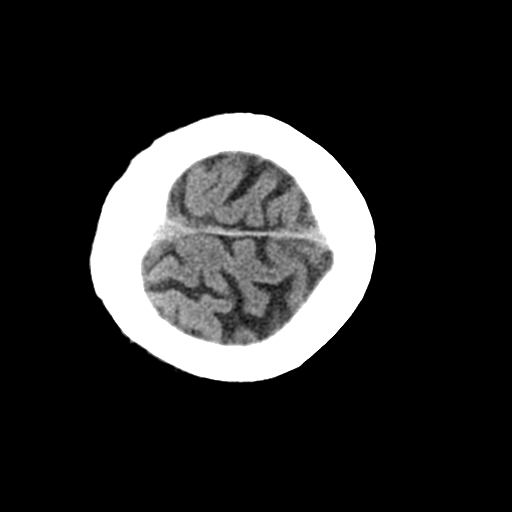
[im 31/34  brain]
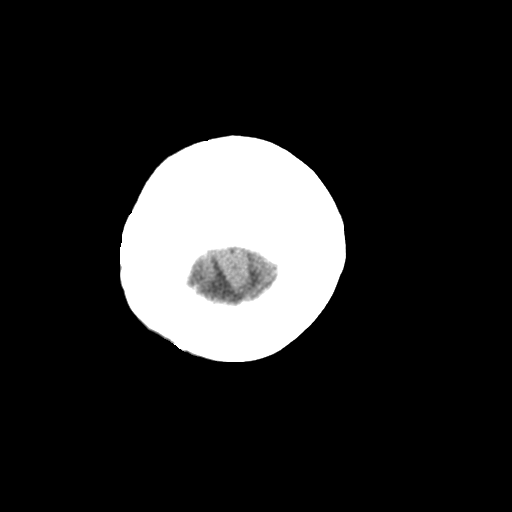
[im 31/34  bone]
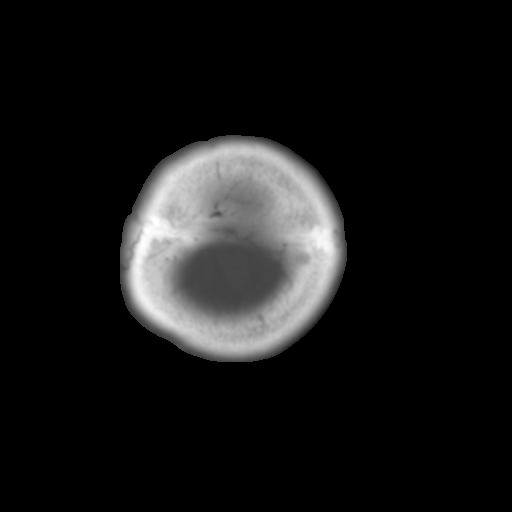

[Series 4: bone windows · axial · 0.42mm/px · z∈[-300,-255]mm · 3 of 34 slices shown]
[im 3/34  bone]
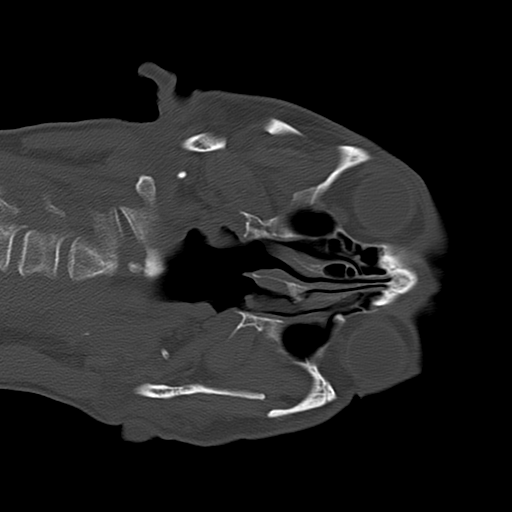
[im 8/34  bone]
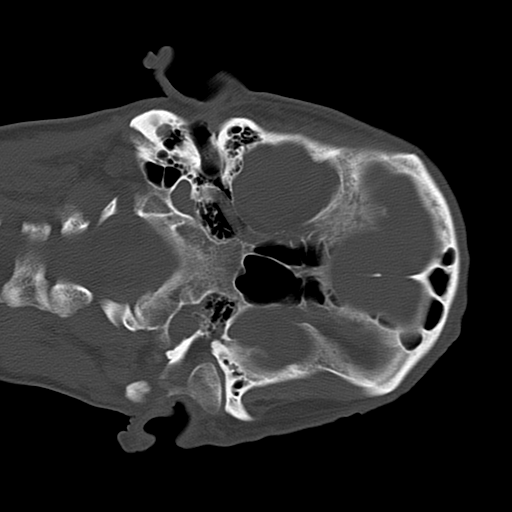
[im 12/34  bone]
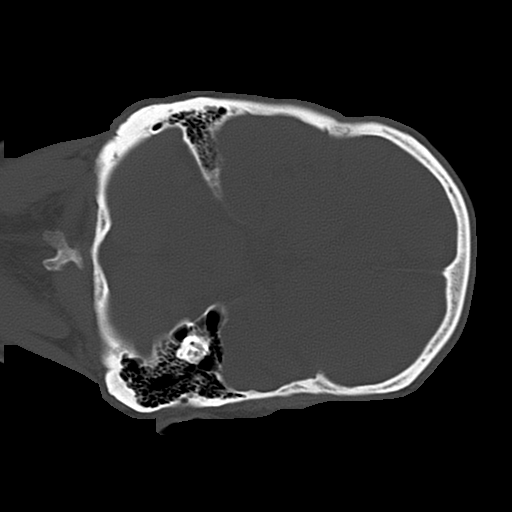

[16 of 30 positions shown; findings below may reference images not displayed]

FINDINGS: There is no acute intracranial hemorrhage, infarction, or mass
lesion. There are old strokes in the pons. Slight diffuse atrophy.
No osseous abnormality.
IMPRESSION: No acute intracranial abnormality.  Old pontine infarcts.

## 2014-08-30 IMAGING — CR DG CHEST 2V
2 series · 2 of 2 positions shown · non-contrast
Comparison: 04/08/2012

CLINICAL DATA: 07/17/2013

EXAM:
CHEST  2 VIEW

[w chest lat]
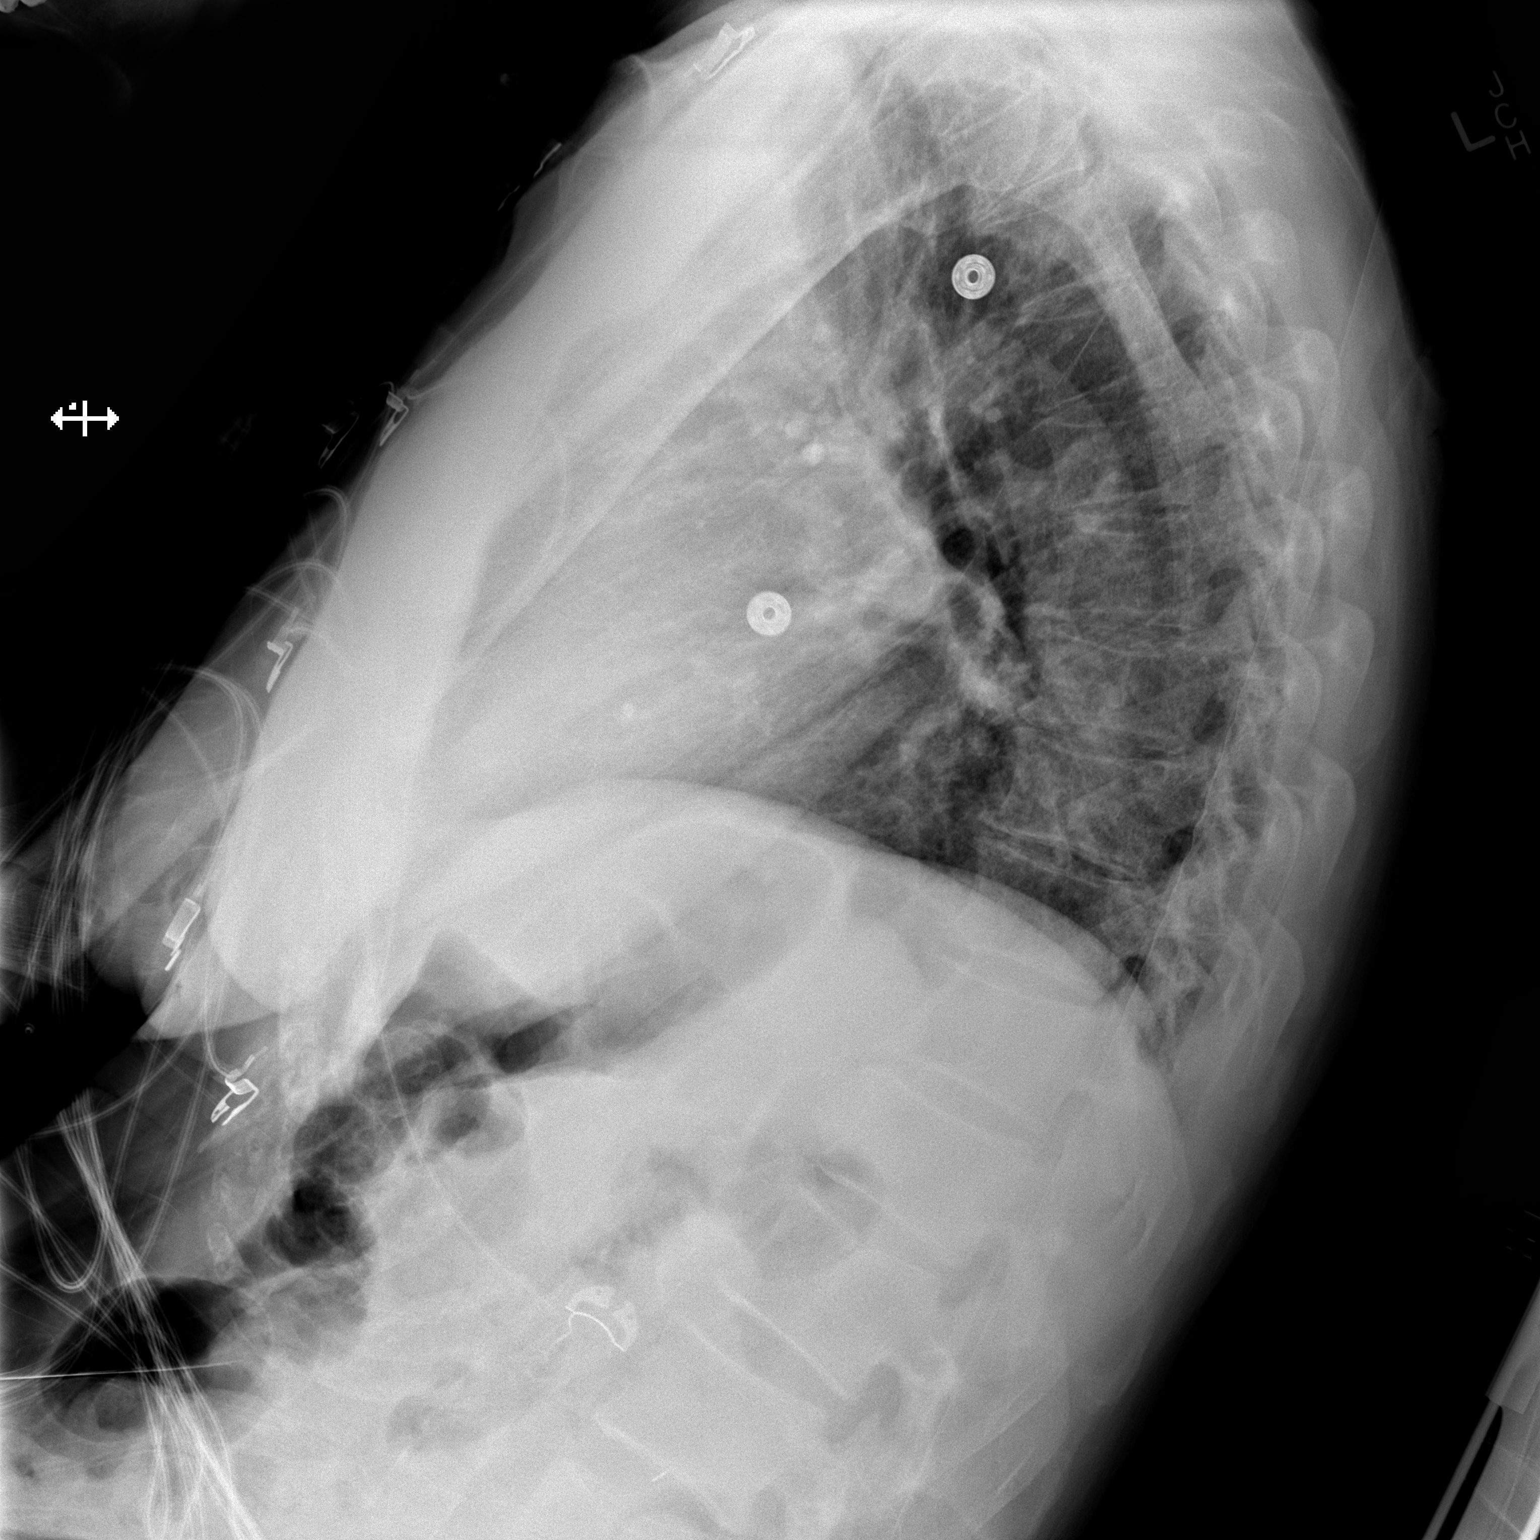

[x chest ap]
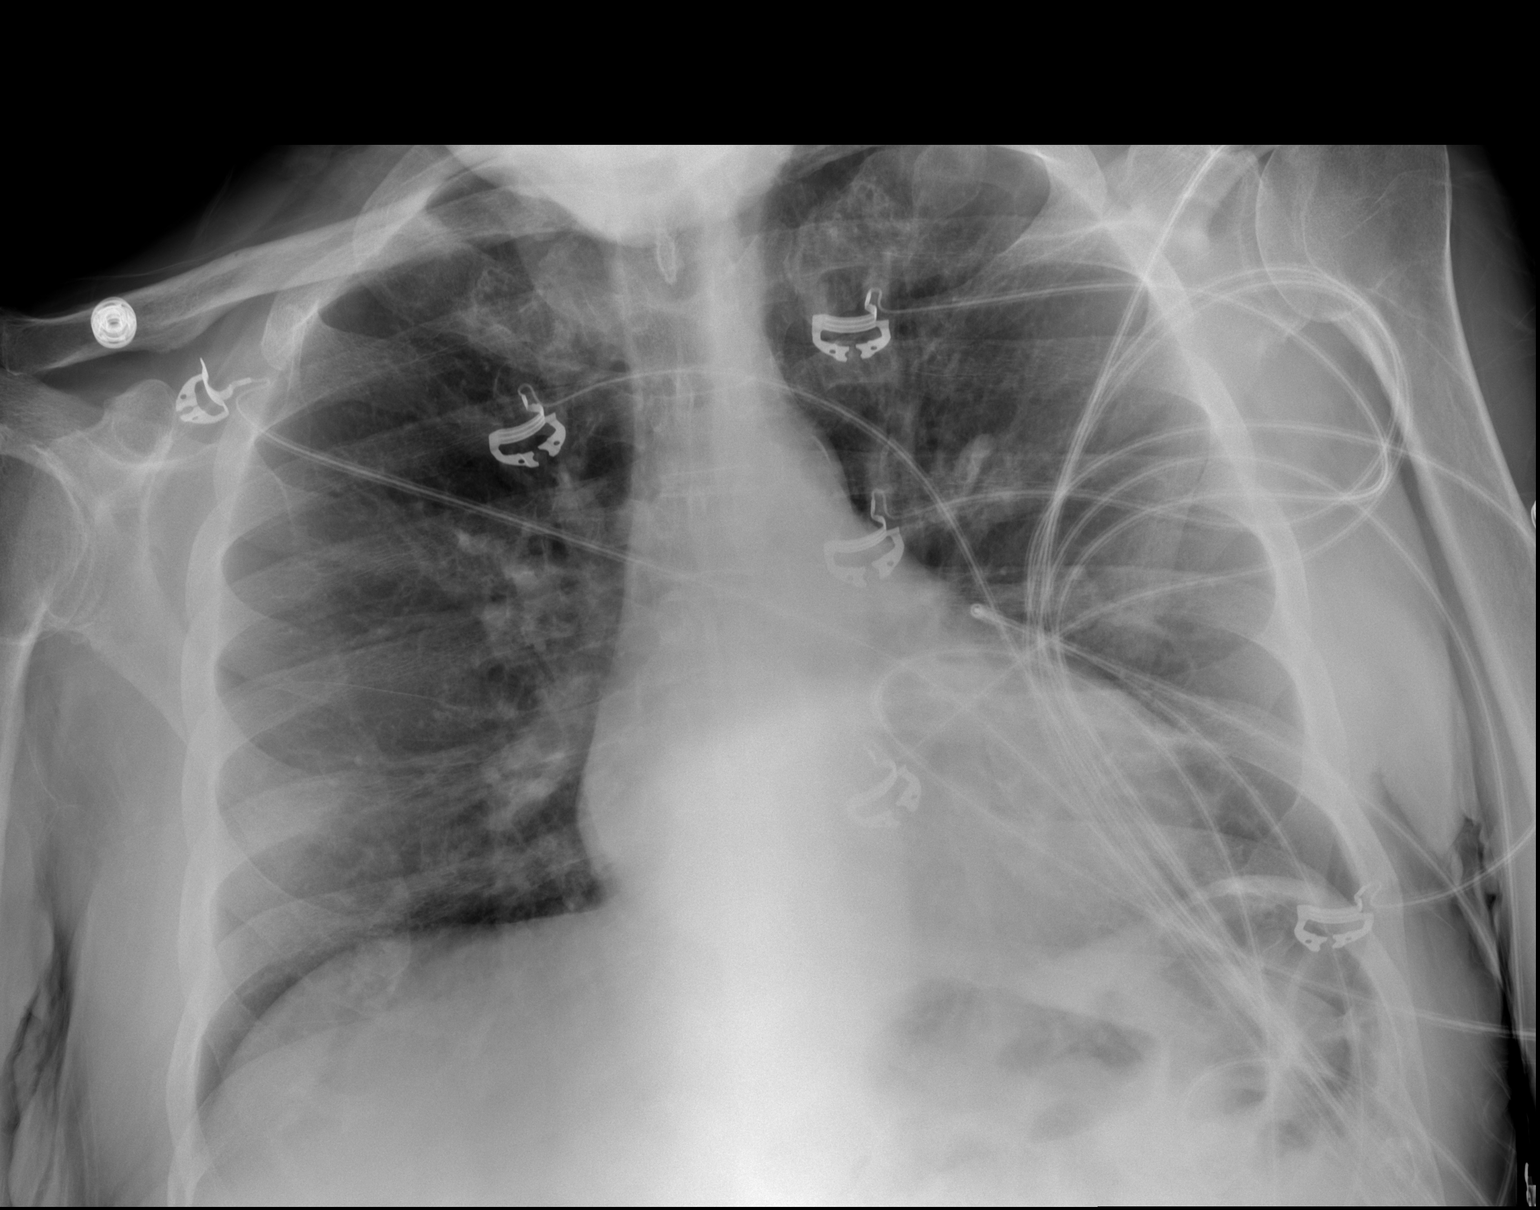

[2 of 2 positions shown; findings below may reference images not displayed]

FINDINGS: Low lung volumes. The heart size and mediastinal contours are within
normal limits. Both lungs are clear. The visualized skeletal
structures are unremarkable.
IMPRESSION: No active cardiopulmonary disease.

## 2014-09-09 ENCOUNTER — Other Ambulatory Visit (HOSPITAL_COMMUNITY): Payer: Self-pay | Admitting: *Deleted

## 2014-09-10 ENCOUNTER — Encounter (HOSPITAL_COMMUNITY)
Admission: RE | Admit: 2014-09-10 | Discharge: 2014-09-10 | Disposition: A | Discharge status: Home / Self Care | Payer: Medicare Other | Source: Ambulatory Visit | Attending: Nephrology | Admitting: Nephrology

## 2014-09-10 DIAGNOSIS — Z940 Kidney transplant status: Secondary | ICD-10-CM | POA: Diagnosis not present

## 2014-09-10 DIAGNOSIS — N182 Chronic kidney disease, stage 2 (mild): Secondary | ICD-10-CM | POA: Insufficient documentation

## 2014-09-10 DIAGNOSIS — D638 Anemia in other chronic diseases classified elsewhere: Secondary | ICD-10-CM | POA: Diagnosis not present

## 2014-09-10 LAB — RENAL FUNCTION PANEL
Albumin: 4.4 g/dL (ref 3.5–5.2)
Anion gap: 5 (ref 5–15)
BUN: 35 mg/dL — AB (ref 6–23)
CO2: 25 mmol/L (ref 19–32)
CREATININE: 1.47 mg/dL — AB (ref 0.50–1.35)
Calcium: 9.4 mg/dL (ref 8.4–10.5)
Chloride: 108 mmol/L (ref 96–112)
GFR, EST AFRICAN AMERICAN: 50 mL/min — AB (ref >90)
GFR, EST NON AFRICAN AMERICAN: 43 mL/min — AB (ref >90)
Glucose, Bld: 218 mg/dL — ABNORMAL HIGH (ref 70–99)
Phosphorus: 3.4 mg/dL (ref 2.3–4.6)
Potassium: 4.9 mmol/L (ref 3.5–5.1)
SODIUM: 138 mmol/L (ref 135–145)

## 2014-09-10 LAB — CBC
HEMATOCRIT: 36.2 % — AB (ref 39.0–52.0)
Hemoglobin: 12.2 g/dL — ABNORMAL LOW (ref 13.0–17.0)
MCH: 27.4 pg (ref 26.0–34.0)
MCHC: 33.7 g/dL (ref 30.0–36.0)
MCV: 81.2 fL (ref 78.0–100.0)
Platelets: 174 10*3/uL (ref 150–400)
RBC: 4.46 MIL/uL (ref 4.22–5.81)
RDW: 12.8 % (ref 11.5–15.5)
WBC: 5.8 10*3/uL (ref 4.0–10.5)

## 2014-09-10 LAB — IRON AND TIBC
IRON: 64 ug/dL (ref 42–165)
Saturation Ratios: 33 % (ref 20–55)
TIBC: 193 ug/dL — AB (ref 215–435)
UIBC: 129 ug/dL (ref 125–400)

## 2014-09-10 MED ORDER — DARBEPOETIN ALFA 100 MCG/0.5ML IJ SOSY: 100.0000 ug | PREFILLED_SYRINGE | INTRAMUSCULAR | Status: DC

## 2014-09-11 LAB — FERRITIN: Ferritin: 604 ng/mL — ABNORMAL HIGH (ref 22–322)

## 2014-09-11 LAB — TACROLIMUS LEVEL: TACROLIMUS (FK506) - LABCORP: 5.4 ng/mL (ref 2.0–20.0)

## 2014-09-24 ENCOUNTER — Encounter (HOSPITAL_COMMUNITY)
Admission: RE | Admit: 2014-09-24 | Discharge: 2014-09-24 | Disposition: A | Discharge status: Home / Self Care | Payer: Medicare Other | Source: Ambulatory Visit | Attending: Nephrology | Admitting: Nephrology

## 2014-09-24 DIAGNOSIS — D638 Anemia in other chronic diseases classified elsewhere: Secondary | ICD-10-CM | POA: Diagnosis not present

## 2014-09-24 LAB — POCT HEMOGLOBIN-HEMACUE: Hemoglobin: 10.4 g/dL — ABNORMAL LOW (ref 13.0–17.0)

## 2014-09-24 MED ORDER — DARBEPOETIN ALFA 100 MCG/0.5ML IJ SOSY
100.0000 ug | PREFILLED_SYRINGE | INTRAMUSCULAR | Status: DC
  Administered 2014-09-24: 100 ug via SUBCUTANEOUS

## 2014-09-25 MED ORDER — DARBEPOETIN ALFA 100 MCG/0.5ML IJ SOSY
PREFILLED_SYRINGE | INTRAMUSCULAR | Status: AC
  Filled 2014-09-25: qty 0.5

## 2014-10-22 ENCOUNTER — Encounter (HOSPITAL_COMMUNITY)
Admission: RE | Admit: 2014-10-22 | Discharge: 2014-10-22 | Disposition: A | Discharge status: Home / Self Care | Payer: Medicare Other | Source: Ambulatory Visit | Attending: Nephrology | Admitting: Nephrology

## 2014-10-22 DIAGNOSIS — N182 Chronic kidney disease, stage 2 (mild): Secondary | ICD-10-CM | POA: Insufficient documentation

## 2014-10-22 DIAGNOSIS — D638 Anemia in other chronic diseases classified elsewhere: Secondary | ICD-10-CM | POA: Diagnosis not present

## 2014-10-22 DIAGNOSIS — Z940 Kidney transplant status: Secondary | ICD-10-CM | POA: Insufficient documentation

## 2014-10-22 LAB — RENAL FUNCTION PANEL
Albumin: 4.2 g/dL (ref 3.5–5.2)
Anion gap: 8 (ref 5–15)
BUN: 30 mg/dL — AB (ref 6–23)
CO2: 25 mmol/L (ref 19–32)
CREATININE: 1.71 mg/dL — AB (ref 0.50–1.35)
Calcium: 9.7 mg/dL (ref 8.4–10.5)
Chloride: 104 mmol/L (ref 96–112)
GFR calc Af Amer: 42 mL/min — ABNORMAL LOW (ref >90)
GFR, EST NON AFRICAN AMERICAN: 36 mL/min — AB (ref >90)
Glucose, Bld: 165 mg/dL — ABNORMAL HIGH (ref 70–99)
POTASSIUM: 5.2 mmol/L — AB (ref 3.5–5.1)
Phosphorus: 3.8 mg/dL (ref 2.3–4.6)
Sodium: 137 mmol/L (ref 135–145)

## 2014-10-22 LAB — CBC
HEMATOCRIT: 37 % — AB (ref 39.0–52.0)
Hemoglobin: 12.4 g/dL — ABNORMAL LOW (ref 13.0–17.0)
MCH: 26.6 pg (ref 26.0–34.0)
MCHC: 33.5 g/dL (ref 30.0–36.0)
MCV: 79.4 fL (ref 78.0–100.0)
PLATELETS: 173 10*3/uL (ref 150–400)
RBC: 4.66 MIL/uL (ref 4.22–5.81)
RDW: 13.2 % (ref 11.5–15.5)
WBC: 5.7 10*3/uL (ref 4.0–10.5)

## 2014-10-22 LAB — FERRITIN: FERRITIN: 590 ng/mL — AB (ref 22–322)

## 2014-10-22 LAB — IRON AND TIBC
Iron: 74 ug/dL (ref 42–165)
Saturation Ratios: 36 % (ref 20–55)
TIBC: 203 ug/dL — AB (ref 215–435)
UIBC: 129 ug/dL (ref 125–400)

## 2014-10-22 MED ORDER — DARBEPOETIN ALFA 100 MCG/0.5ML IJ SOSY: 100.0000 ug | PREFILLED_SYRINGE | INTRAMUSCULAR | Status: DC

## 2014-10-23 LAB — TACROLIMUS LEVEL: Tacrolimus (FK506) - LabCorp: 6.3 ng/mL (ref 2.0–20.0)

## 2014-10-29 ENCOUNTER — Encounter (HOSPITAL_COMMUNITY): Payer: Self-pay | Admitting: Emergency Medicine

## 2014-10-29 ENCOUNTER — Emergency Department (HOSPITAL_COMMUNITY)
Admission: EM | Admit: 2014-10-29 | Discharge: 2014-10-30 | Disposition: A | Discharge status: Home / Self Care | Payer: Medicare Other | Attending: Emergency Medicine | Admitting: Emergency Medicine

## 2014-10-29 ENCOUNTER — Emergency Department (HOSPITAL_COMMUNITY): Payer: Medicare Other

## 2014-10-29 DIAGNOSIS — Z79899 Other long term (current) drug therapy: Secondary | ICD-10-CM | POA: Insufficient documentation

## 2014-10-29 DIAGNOSIS — Z87440 Personal history of urinary (tract) infections: Secondary | ICD-10-CM | POA: Insufficient documentation

## 2014-10-29 DIAGNOSIS — Z8701 Personal history of pneumonia (recurrent): Secondary | ICD-10-CM | POA: Insufficient documentation

## 2014-10-29 DIAGNOSIS — E119 Type 2 diabetes mellitus without complications: Secondary | ICD-10-CM | POA: Diagnosis not present

## 2014-10-29 DIAGNOSIS — R4182 Altered mental status, unspecified: Secondary | ICD-10-CM | POA: Insufficient documentation

## 2014-10-29 DIAGNOSIS — Z940 Kidney transplant status: Secondary | ICD-10-CM | POA: Insufficient documentation

## 2014-10-29 DIAGNOSIS — Z7982 Long term (current) use of aspirin: Secondary | ICD-10-CM | POA: Diagnosis not present

## 2014-10-29 DIAGNOSIS — Z992 Dependence on renal dialysis: Secondary | ICD-10-CM | POA: Insufficient documentation

## 2014-10-29 DIAGNOSIS — Z8673 Personal history of transient ischemic attack (TIA), and cerebral infarction without residual deficits: Secondary | ICD-10-CM | POA: Diagnosis not present

## 2014-10-29 DIAGNOSIS — Z8546 Personal history of malignant neoplasm of prostate: Secondary | ICD-10-CM | POA: Diagnosis not present

## 2014-10-29 DIAGNOSIS — M542 Cervicalgia: Secondary | ICD-10-CM | POA: Diagnosis not present

## 2014-10-29 DIAGNOSIS — M1990 Unspecified osteoarthritis, unspecified site: Secondary | ICD-10-CM | POA: Diagnosis not present

## 2014-10-29 DIAGNOSIS — Z87891 Personal history of nicotine dependence: Secondary | ICD-10-CM | POA: Diagnosis not present

## 2014-10-29 DIAGNOSIS — J029 Acute pharyngitis, unspecified: Secondary | ICD-10-CM | POA: Insufficient documentation

## 2014-10-29 DIAGNOSIS — I120 Hypertensive chronic kidney disease with stage 5 chronic kidney disease or end stage renal disease: Secondary | ICD-10-CM | POA: Diagnosis not present

## 2014-10-29 DIAGNOSIS — N186 End stage renal disease: Secondary | ICD-10-CM | POA: Insufficient documentation

## 2014-10-29 DIAGNOSIS — R011 Cardiac murmur, unspecified: Secondary | ICD-10-CM | POA: Insufficient documentation

## 2014-10-29 DIAGNOSIS — M549 Dorsalgia, unspecified: Secondary | ICD-10-CM | POA: Insufficient documentation

## 2014-10-29 DIAGNOSIS — K219 Gastro-esophageal reflux disease without esophagitis: Secondary | ICD-10-CM | POA: Diagnosis not present

## 2014-10-29 DIAGNOSIS — R109 Unspecified abdominal pain: Secondary | ICD-10-CM | POA: Insufficient documentation

## 2014-10-29 DIAGNOSIS — Z794 Long term (current) use of insulin: Secondary | ICD-10-CM | POA: Insufficient documentation

## 2014-10-29 LAB — CBC WITH DIFFERENTIAL/PLATELET
Basophils Absolute: 0 10*3/uL (ref 0.0–0.1)
Basophils Relative: 0 % (ref 0–1)
EOS ABS: 0.2 10*3/uL (ref 0.0–0.7)
EOS PCT: 3 % (ref 0–5)
HCT: 36.1 % — ABNORMAL LOW (ref 39.0–52.0)
Hemoglobin: 12.2 g/dL — ABNORMAL LOW (ref 13.0–17.0)
LYMPHS PCT: 27 % (ref 12–46)
Lymphs Abs: 1.5 10*3/uL (ref 0.7–4.0)
MCH: 26.9 pg (ref 26.0–34.0)
MCHC: 33.8 g/dL (ref 30.0–36.0)
MCV: 79.5 fL (ref 78.0–100.0)
MONO ABS: 0.3 10*3/uL (ref 0.1–1.0)
MONOS PCT: 6 % (ref 3–12)
NEUTROS ABS: 3.5 10*3/uL (ref 1.7–7.7)
Neutrophils Relative %: 64 % (ref 43–77)
Platelets: 177 10*3/uL (ref 150–400)
RBC: 4.54 MIL/uL (ref 4.22–5.81)
RDW: 13.3 % (ref 11.5–15.5)
WBC: 5.5 10*3/uL (ref 4.0–10.5)

## 2014-10-29 LAB — I-STAT TROPONIN, ED: Troponin i, poc: 0.03 ng/mL (ref 0.00–0.08)

## 2014-10-29 MED ORDER — OXYCODONE-ACETAMINOPHEN 5-325 MG PO TABS
1.0000 | ORAL_TABLET | Freq: Once | ORAL | Status: AC
  Administered 2014-10-29: 1 via ORAL
  Filled 2014-10-29: qty 1

## 2014-10-29 NOTE — ED Notes (Signed)
Pt remains in scan, phlebotomy attempted to come by to draw blood.

## 2014-10-29 NOTE — ED Provider Notes (Signed)
CSN: [id number]     Arrival date & time 10/29/14  2104 History   First MD Initiated Contact with Patient 10/29/14 2120     Chief Complaint  Patient presents with  . Altered Mental Status     (Consider location/radiation/quality/duration/timing/severity/associated sxs/prior Treatment) HPI Evan [name] is a 81 y.o. male with hx of DM, HTN, CVA, ESRD with hx of kidney transplant, afib, presents to ED with complaint of altered mental status. Patient is coming from home, where he lives with his elderly wife and has home health care taker. Apparently tonight, one of his care takers asked him if he was ready for his dinner and pt did not respond. Pt was sitting in his recliner, awake. She asked him several more questions and he would not respond so EMS was called. Upon EMS arrival, pt is alert oriented, responding appropriately. According to pt's granddaughter, pt has done this with this care taker that "he is not very fond of" in the past, and had an episode of where he ignored her and then talked to another care taker when she came on shift. Pt has complaint of neck pain and back pain which is chronic, throat pain, and abdominal discomfort, which he describes as "tight band around my abdomen." No recent illnesses. No nausea, vomiting. No urinary symptoms.   Past Medical History  Diagnosis Date  . H/O kidney transplant     End stage renal disease  . Diabetes mellitus   . Cancer     hx prostate cancer  . ESRD (end stage renal disease) 2011    hemodialysis started Sept 2011, cadaveric (double) renal  transplant Mar 2012 at [facility]  . Stroke   . GERD (gastroesophageal reflux disease)   . Hypertension   . Dysrhythmia     hx of atrial fibrilation  . Shortness of breath     " sometimes "  . Pneumonia 04/08/2012  . Heart murmur   . Arthritis     hands  . UTI (urinary tract infection)    Past Surgical History  Procedure Laterality Date  . Kidney transplant    . Prostate surgery  approx. 2003     seed implant   Family History  Problem Relation Age of Onset  . Colon cancer Mother    History  Substance Use Topics  . Smoking status: Former Smoker -- 15 years    Types: Cigarettes    Quit date: 01/24/1983  . Smokeless tobacco: Never Used  . Alcohol Use: No    Review of Systems  Constitutional: Negative for fever and chills.  HENT: Positive for sore throat.   Respiratory: Negative for cough, chest tightness and shortness of breath.   Cardiovascular: Negative for chest pain, palpitations and leg swelling.  Gastrointestinal: Positive for abdominal pain. Negative for nausea, vomiting, diarrhea and abdominal distention.  Genitourinary: Negative for dysuria, urgency, frequency and hematuria.  Musculoskeletal: Positive for back pain, arthralgias and neck pain. Negative for myalgias and neck stiffness.  Skin: Negative for rash.  Allergic/Immunologic: Negative for immunocompromised state.  Neurological: Negative for dizziness, weakness, light-headedness, numbness and headaches.  Psychiatric/Behavioral: Positive for confusion.  All other systems reviewed and are negative.     Allergies  Review of patient's allergies indicates no known allergies.  Home Medications   Prior to Admission medications   Medication Sig Start Date End Date Taking? Authorizing Provider  aspirin 81 MG tablet Take 81 mg by mouth daily.    Historical Provider, MD  baclofen (LIORESAL)  20 MG tablet Take 20 mg by mouth at bedtime. For hiccups    Historical Provider, MD  calcium carbonate (TUMS EX) 750 MG chewable tablet Chew 1 tablet by mouth 3 (three) times daily.    Historical Provider, MD  dextrose (GLUTOSE) 40 % GEL Take 37.5 g by mouth as needed for low blood sugar (CBG less than 70 or CBG greater than 70 and next meal is more than 1 hours away). 07/22/13   Janece Canterbury, MD  diltiazem (TIAZAC) 300 MG 24 hr capsule Take 300 mg by mouth daily.     Historical Provider, MD  diphenhydramine-acetaminophen  (TYLENOL PM EXTRA STRENGTH) 25-500 MG TABS Take 2 tablets by mouth at bedtime as needed (sleep).     Historical Provider, MD  docusate sodium (COLACE) 100 MG capsule Take 1 capsule (100 mg total) by mouth 2 (two) times daily as needed for mild constipation. 07/15/14   Pamella Pert, MD  gabapentin (NEURONTIN) 100 MG capsule Take 100 mg by mouth 3 (three) times daily. Take 2 pills 3 times a day    Historical Provider, MD  glipiZIDE (GLUCOTROL) 5 MG tablet Take by mouth daily before breakfast.    Historical Provider, MD  guaiFENesin (MUCINEX) 600 MG 12 hr tablet Take 600 mg by mouth 2 (two) times daily as needed for cough.    Historical Provider, MD  hydrALAZINE (APRESOLINE) 100 MG tablet Take 50 mg by mouth 2 (two) times daily.     Historical Provider, MD  insulin aspart (NOVOLOG) 100 UNIT/ML injection Inject 5-10 Units into the skin 3 (three) times daily before meals. cbg 200-300 5 units; 301-400 8 units; 401-500 10 units 07/22/13   Janece Canterbury, MD  loratadine (CLARITIN) 10 MG tablet Take 10 mg by mouth daily as needed. allergies    Historical Provider, MD  megestrol (MEGACE) 400 MG/10ML suspension Take 400 mg by mouth daily as needed. For appetite    Historical Provider, MD  mycophenolate (MYFORTIC) 180 MG EC tablet Take 180 mg by mouth 2 (two) times daily.    Historical Provider, MD  oxyCODONE-acetaminophen (PERCOCET) 5-325 MG per tablet Take 1 tablet by mouth every 8 (eight) hours as needed for moderate pain. 07/15/14   Pamella Pert, MD  polyethylene glycol (MIRALAX / GLYCOLAX) packet Take 17 g by mouth daily as needed. constipation    Historical Provider, MD  ranitidine (ZANTAC) 300 MG capsule Take 300 mg by mouth daily.    Historical Provider, MD  sodium bicarbonate 650 MG tablet Take 1,300 mg by mouth 2 (two) times daily.    Historical Provider, MD  tacrolimus (PROGRAF) 5 MG capsule Take 1 mg by mouth 2 (two) times daily. (4 TABLETS TWICE DAILY)    Historical Provider, MD   BP 162/62  mmHg  Pulse 77  Temp(Src) 98.4 F (36.9 C)  Resp 25  SpO2 96% Physical Exam  Constitutional: He is oriented to person, place, and time. He appears well-developed and well-nourished. No distress.  HENT:  Head: Normocephalic and atraumatic.  Eyes: Conjunctivae and EOM are normal. Pupils are equal, round, and reactive to light.  Neck: Neck supple.  Cardiovascular: Normal rate, regular rhythm and normal heart sounds.   Pulmonary/Chest: Effort normal. No respiratory distress. He has no wheezes. He has no rales.  Abdominal: Soft. Bowel sounds are normal. He exhibits no distension. There is no tenderness. There is no rebound and no guarding.  Musculoskeletal: He exhibits no edema.  Neurological: He is alert and oriented to person, place, and  time.  Right facial droop. Speech slurred. Right grip weakness compared to left. Pt following simple instructions.   Skin: Skin is warm and dry.  Nursing note and vitals reviewed.   ED Course  Procedures (including critical care time) Labs Review Labs Reviewed  CBC WITH DIFFERENTIAL/PLATELET - Abnormal; Notable for the following:    Hemoglobin 12.2 (*)    HCT 36.1 (*)    All other components within normal limits  COMPREHENSIVE METABOLIC PANEL - Abnormal; Notable for the following:    BUN 40 (*)    Creatinine, Ser 1.79 (*)    GFR calc non Af Amer 34 (*)    GFR calc Af Amer 40 (*)    All other components within normal limits  URINALYSIS, ROUTINE W REFLEX MICROSCOPIC - Abnormal; Notable for the following:    Protein, ur 30 (*)    All other components within normal limits  URINE MICROSCOPIC-ADD ON - Abnormal; Notable for the following:    Casts HYALINE CASTS (*)    All other components within normal limits  I-STAT TROPOININ, ED    Imaging Review Dg Chest 2 View  10/29/2014   CLINICAL DATA:  Decreased level of L scratch head decreased level of consciousness. Difficulty swallowing. Abdominal pain. Slurred speech, shortness of breath.  EXAM:  CHEST  2 VIEW  COMPARISON:  07/17/2013  FINDINGS: Mild cardiomegaly. No confluent opacities, effusions or edema. No acute bony abnormality.  IMPRESSION: No active cardiopulmonary disease.   Electronically Signed   By: Rolm Baptise M.D.   On: 10/29/2014 22:42   Dg Abd 1 View  10/29/2014   CLINICAL DATA:  Abdominal pain.  EXAM: ABDOMEN - 1 VIEW  COMPARISON:  06/24/2013  FINDINGS: Gas within mildly prominent large and small bowel loops. This may reflect mild ileus. No evidence of bowel obstruction. No free air organomegaly. No acute bony abnormality.  IMPRESSION: Mild gaseous distention of bowel, question mild ileus.   Electronically Signed   By: Rolm Baptise M.D.   On: 10/29/2014 23:26   Ct Head Wo Contrast  10/29/2014   CLINICAL DATA:  Altered mental status. Decreased level of consciousness. Slurred speech for 2 days.  EXAM: CT HEAD WITHOUT CONTRAST  TECHNIQUE: Contiguous axial images were obtained from the base of the skull through the vertex without intravenous contrast.  COMPARISON:  07/15/2014  FINDINGS: No acute intracranial abnormality. Specifically, no hemorrhage, hydrocephalus, mass lesion, acute infarction, or significant intracranial injury. No acute calvarial abnormality. Visualized paranasal sinuses and mastoids clear. Orbital soft tissues unremarkable.  IMPRESSION: No acute intracranial abnormality.   Electronically Signed   By: Rolm Baptise M.D.   On: 10/29/2014 23:37     EKG Interpretation   Date/Time:  Friday October 29 2014 21:05:54 EDT Ventricular Rate:  76 PR Interval:  195 QRS Duration: 98 QT Interval:  380 QTC Calculation: 427 R Axis:   -5 Text Interpretation:  Sinus rhythm Probable left ventricular hypertrophy  Abnormal T, consider ischemia, lateral leads Anterior ST elevation,  probably due to LVH Confirmed by COOK  MD, BRIAN ([id number]) on 10/29/2014  10:32:15 PM      MDM   Final diagnoses:  Abdominal pain  Altered mental status, unspecified altered mental status type     Pt with prior CVAs, multiple other medical problems, here after episode of not verbally responding to his care taker. Grand daughter at bedside, pt at baseline. She is concerned that he didn't respond bc "he doesn't like that care taker." Pt complaining of chronic  back and neck pain, some abdominal pain. Will get labs, CT head, ua.   Patient's workup is unremarkable. He is mildly hypertensive otherwise normal vital signs. He is resting comfortably with no complaints at this time. Creatinine is elevated but at baseline. Discussed with the granddaughter, patient okay to be discharged home he will follow with primary care doctor.  Filed Vitals:   10/29/14 2341 10/29/14 2345 10/30/14 0126 10/30/14 0130  BP: 161/61 162/77 165/57 153/53  Pulse: 70 70 71 71  Temp:      Resp: 13 15 12 11   SpO2: 100% 100% 99% 96%       Jeannett Senior, PA-C 10/31/14 2201  Nat Christen, MD 11/03/14 [phone]

## 2014-10-29 NOTE — ED Notes (Addendum)
Per EMS: Pt from home reports decreased LOC by homehealth nurse and hard to arouse, pt alert and oriented at this time, answers questions appropriately.  Pt c/o abdominal pain, slurred speech x2 days, and has chronic neck pain, sob.  Pt has previous stroke and right sided facial droop from stroke.  Pt has had kidney transplant.

## 2014-10-29 NOTE — ED Notes (Signed)
Patient Returned from [place].    Phlebotomy now at Bedside.

## 2014-10-30 DIAGNOSIS — R4182 Altered mental status, unspecified: Secondary | ICD-10-CM | POA: Diagnosis not present

## 2014-10-30 LAB — COMPREHENSIVE METABOLIC PANEL
ALBUMIN: 4.2 g/dL (ref 3.5–5.2)
ALK PHOS: 54 U/L (ref 39–117)
ALT: 23 U/L (ref 0–53)
AST: 24 U/L (ref 0–37)
Anion gap: 9 (ref 5–15)
BILIRUBIN TOTAL: 0.6 mg/dL (ref 0.3–1.2)
BUN: 40 mg/dL — ABNORMAL HIGH (ref 6–23)
CO2: 25 mmol/L (ref 19–32)
Calcium: 9.5 mg/dL (ref 8.4–10.5)
Chloride: 105 mmol/L (ref 96–112)
Creatinine, Ser: 1.79 mg/dL — ABNORMAL HIGH (ref 0.50–1.35)
GFR calc Af Amer: 40 mL/min — ABNORMAL LOW (ref >90)
GFR, EST NON AFRICAN AMERICAN: 34 mL/min — AB (ref >90)
Glucose, Bld: 83 mg/dL (ref 70–99)
Potassium: 5.1 mmol/L (ref 3.5–5.1)
SODIUM: 139 mmol/L (ref 135–145)
TOTAL PROTEIN: 7.8 g/dL (ref 6.0–8.3)

## 2014-10-30 LAB — URINE MICROSCOPIC-ADD ON

## 2014-10-30 LAB — URINALYSIS, ROUTINE W REFLEX MICROSCOPIC
Bilirubin Urine: NEGATIVE
Glucose, UA: NEGATIVE mg/dL
HGB URINE DIPSTICK: NEGATIVE
KETONES UR: NEGATIVE mg/dL
LEUKOCYTES UA: NEGATIVE
NITRITE: NEGATIVE
Protein, ur: 30 mg/dL — AB
Specific Gravity, Urine: 1.015 (ref 1.005–1.030)
UROBILINOGEN UA: 0.2 mg/dL (ref 0.0–1.0)
pH: 7.5 (ref 5.0–8.0)

## 2014-10-30 NOTE — ED Notes (Signed)
Pt made aware to return if symptoms worsen or if any life threatening symptoms occur.   

## 2014-10-30 NOTE — Discharge Instructions (Signed)
Your CT scans, xrays, lab work did not show any concerning findings. Please follow up with primary care doctor in two days. Continue all regular medications as prescribed.    Confusion Confusion is the inability to think with your usual speed or clarity. Confusion may come on quickly or slowly over time. How quickly the confusion comes on depends on the cause. Confusion can be due to any number of causes. CAUSES   Concussion, head injury, or head trauma.  Seizures.  Stroke.  Fever.  Brain tumor.  Age related decreased brain function (dementia).  Heightened emotional states like rage or terror.  Mental illness in which the person loses the ability to determine what is real and what is not (hallucinations).  Infections such as a urinary tract infection (UTI).  Toxic effects from alcohol, drugs, or prescription medicines.  Dehydration and an imbalance of salts in the body (electrolytes).  Lack of sleep.  Low blood sugar (diabetes).  Low levels of oxygen from conditions such as chronic lung disorders.  Drug interactions or other medicine side effects.  Nutritional deficiencies, especially niacin, thiamine, vitamin C, or vitamin B.  Sudden drop in body temperature (hypothermia).  Change in routine, such as when traveling or hospitalized. SIGNS AND SYMPTOMS  People often describe their thinking as cloudy or unclear when they are confused. Confusion can also include feeling disoriented. That means you are unaware of where or who you are. You may also not know what the date or time is. If confused, you may also have difficulty paying attention, remembering, and making decisions. Some people also act aggressively when they are confused.  DIAGNOSIS  The medical evaluation of confusion may include:  Blood and urine tests.  X-rays.  Brain and nervous system tests.  Analyzing your brain waves (electroencephalogram or EEG).  Magnetic resonance imaging (MRI) of your  head.  Computed tomography (CT) scan of your head.  Mental status tests in which your health care provider may ask many questions. Some of these questions may seem silly or strange, but they are a very important test to help diagnose and treat confusion. TREATMENT  An admission to the hospital may not be needed, but a person with confusion should not be left alone. Stay with a family member or friend until the confusion clears. Avoid alcohol, pain relievers, or sedative drugs until you have fully recovered. Do not drive until directed by your health care provider. HOME CARE INSTRUCTIONS  What family and friends can do:  To find out if someone is confused, ask the person to state his or her name, age, and the date. If the person is unsure or answers incorrectly, he or she is confused.  Always introduce yourself, no matter how well the person knows you.  Often remind the person of his or her location.  Place a calendar and clock near the confused person.  Help the person with his or her medicines. You may want to use a pill box, an alarm as a reminder, or give the person each dose as prescribed.  Talk about current events and plans for the day.  Try to keep the environment calm, quiet, and peaceful.  Make sure the person keeps follow-up visits with his or her health care provider. PREVENTION  Ways to prevent confusion:  Avoid alcohol.  Eat a balanced diet.  Get enough sleep.  Take medicine only as directed by your health care provider.  Do not become isolated. Spend time with other people and make plans for  your days.  Keep careful watch on your blood sugar levels if you are diabetic. SEEK IMMEDIATE MEDICAL CARE IF:   You develop severe headaches, repeated vomiting, seizures, blackouts, or slurred speech.  There is increasing confusion, weakness, numbness, restlessness, or personality changes.  You develop a loss of balance, have marked dizziness, feel uncoordinated, or  fall.  You have delusions, hallucinations, or develop severe anxiety.  Your family members think you need to be rechecked. Document Released: 08/30/2004 Document Revised: 12/07/2013 Document Reviewed: 08/28/2013 [facility] Patient Information 2015 [place], [place]. This information is not intended to replace advice given to you by your health care provider. Make sure you discuss any questions you have with your health care provider.

## 2014-10-30 NOTE — ED Notes (Signed)
PTAR at bedside to transport patient 

## 2014-11-09 ENCOUNTER — Ambulatory Visit (INDEPENDENT_AMBULATORY_CARE_PROVIDER_SITE_OTHER): Payer: Medicare Other | Admitting: Diagnostic Neuroimaging

## 2014-11-09 ENCOUNTER — Encounter: Payer: Self-pay | Admitting: Diagnostic Neuroimaging

## 2014-11-09 VITALS — Ht 71.0 in

## 2014-11-09 DIAGNOSIS — G20 Parkinson's disease: Secondary | ICD-10-CM | POA: Diagnosis not present

## 2014-11-09 DIAGNOSIS — R269 Unspecified abnormalities of gait and mobility: Secondary | ICD-10-CM | POA: Diagnosis not present

## 2014-11-09 DIAGNOSIS — Z940 Kidney transplant status: Secondary | ICD-10-CM

## 2014-11-09 DIAGNOSIS — M542 Cervicalgia: Secondary | ICD-10-CM | POA: Diagnosis not present

## 2014-11-09 DIAGNOSIS — M5489 Other dorsalgia: Secondary | ICD-10-CM

## 2014-11-09 DIAGNOSIS — I350 Nonrheumatic aortic (valve) stenosis: Secondary | ICD-10-CM | POA: Diagnosis not present

## 2014-11-09 DIAGNOSIS — G20C Parkinsonism, unspecified: Secondary | ICD-10-CM

## 2014-11-09 NOTE — Progress Notes (Addendum)
[facility]  PATIENT: Evan [name] DOB: 1932/02/25  REFERRING CLINICIAN: Naomie Dean HISTORY FROM: patient, wife and caregivers x 2 REASON FOR VISIT: new consult    HISTORICAL  CHIEF COMPLAINT:  Chief Complaint  Patient presents with  . New Evaluation    altered mental status; shakiness and weakness with fatigue     HISTORY OF PRESENT ILLNESS:   81 year old right-handed male with history of hypertension, diabetes, end-stage renal disease status post kidney transplant, prostate cancer, severe aortic stenosis, here for evaluation of general physical and cognitive decline, weakness, tremor, loss of appetite, shortness of breath, fatigue, increasing falls. Symptoms have been gradually progressive over several years, especially over the last 1-2 months.  Patient wife and family have noted some memory loss. Patient denies any memory issues. He states that people talk too quickly around him and he is not able to get his words out and time to express his thoughts. Wife is noted some confusion and hallucination in the nighttime and evening. Patient denies this. Patient having more mobility problems, now essentially wheelchair bound. Patient has had several emergency room and hospital visits over the past 1-2 years. Patient having some intermittent shaking and tremor in his hands, which fluctuates throughout the day.   REVIEW OF SYSTEMS: Full 14 system review of systems performed and notable only for depression anxiety to much sleep not asleep decreased energy disinterest in activities hallucinations difficulty swallowing tremor slurred speech weakness numbness confusion insomnia sleepiness feeling cold joint pain aching muscles constipation incontinence trouble swallowing hearing loss soreness of breath cough blurred vision fatigue murmur.  ALLERGIES: No Known Allergies  HOME MEDICATIONS: Outpatient Prescriptions Prior to Visit  Medication Sig Dispense Refill  .  aspirin 81 MG tablet Take 81 mg by mouth daily.    . calcium carbonate (TUMS EX) 750 MG chewable tablet Chew 1 tablet by mouth 3 (three) times daily.    Marland Kitchen dextrose (GLUTOSE) 40 % GEL Take 37.5 g by mouth as needed for low blood sugar (CBG less than 70 or CBG greater than 70 and next meal is more than 1 hours away). 30 Tube 0  . diltiazem (TIAZAC) 300 MG 24 hr capsule Take 300 mg by mouth daily.     . diphenhydramine-acetaminophen (TYLENOL PM EXTRA STRENGTH) 25-500 MG TABS Take 2 tablets by mouth at bedtime as needed (sleep).     Marland Kitchen glipiZIDE (GLUCOTROL) 5 MG tablet Take 5 mg by mouth daily before breakfast.     . guaiFENesin (MUCINEX) 600 MG 12 hr tablet Take 600 mg by mouth 2 (two) times daily as needed for cough.    . hydrALAZINE (APRESOLINE) 100 MG tablet Take 50 mg by mouth 2 (two) times daily.     . insulin aspart (NOVOLOG) 100 UNIT/ML injection Inject 5-10 Units into the skin 3 (three) times daily before meals. cbg 200-300 5 units; 301-400 8 units; 401-500 10 units 1 vial 12  . loratadine (CLARITIN) 10 MG tablet Take 10 mg by mouth daily as needed. allergies    . megestrol (MEGACE) 400 MG/10ML suspension Take 400 mg by mouth daily as needed. For appetite    . mycophenolate (MYFORTIC) 180 MG EC tablet Take 180 mg by mouth 2 (two) times daily.    Marland Kitchen oxyCODONE-acetaminophen (PERCOCET) 5-325 MG per tablet Take 1 tablet by mouth every 8 (eight) hours as needed for moderate pain. 20 tablet 0  . polyethylene glycol (MIRALAX / GLYCOLAX) packet Take 17 g by mouth daily as needed.  M/W/F for constipation    . ranitidine (ZANTAC) 300 MG capsule Take 300 mg by mouth daily.    . sodium bicarbonate 650 MG tablet Take 1,300 mg by mouth 2 (two) times daily.    . tacrolimus (PROGRAF) 1 MG capsule Take 3 mg by mouth 2 (two) times daily.     . baclofen (LIORESAL) 20 MG tablet Take 20 mg by mouth at bedtime. For hiccups    . docusate sodium (COLACE) 100 MG capsule Take 1 capsule (100 mg total) by mouth 2 (two)  times daily as needed for mild constipation. 60 capsule 0  . gabapentin (NEURONTIN) 100 MG capsule Take 100 mg by mouth 3 (three) times daily. Take 2 pills 3 times a day    . tacrolimus (PROGRAF) 5 MG capsule Take 1 mg by mouth 2 (two) times daily. (4 TABLETS TWICE DAILY)     No facility-administered medications prior to visit.    PAST MEDICAL HISTORY: Past Medical History  Diagnosis Date  . H/O kidney transplant     End stage renal disease  . Diabetes mellitus   . Cancer     hx prostate cancer  . ESRD (end stage renal disease) 2011    hemodialysis started Sept 2011, cadaveric (double) renal  transplant Mar 2012 at [facility]  . Stroke   . GERD (gastroesophageal reflux disease)   . Hypertension   . Dysrhythmia     hx of atrial fibrilation  . Shortness of breath     " sometimes "  . Pneumonia 04/08/2012  . Heart murmur   . Arthritis     hands  . UTI (urinary tract infection)   . Osteoarthritis   . Iron deficiency     PAST SURGICAL HISTORY: Past Surgical History  Procedure Laterality Date  . Kidney transplant      had both kidneys replaced at once   . Prostate surgery  approx. 2003    seed implant    FAMILY HISTORY: Family History  Problem Relation Age of Onset  . Colon cancer Mother     SOCIAL HISTORY:  History   Social History  . Marital Status: Married    Spouse Name: [name]  . Number of Children: 1  . Years of Education: BA, MS   Occupational History  .     Social History Main Topics  . Smoking status: Former Smoker -- 15 years    Types: Cigarettes    Quit date: 01/24/1983  . Smokeless tobacco: Never Used  . Alcohol Use: No  . Drug Use: No  . Sexual Activity: Not Currently   Other Topics Concern  . Not on file   Social History Narrative   Pt lives at home with his spouse.   Caffeine Use: Drinks coffee occasionally.      PHYSICAL EXAM  Filed Vitals:   11/09/14 1129  Height: 5\' 11"  (1.803 m)    Body mass index is 0.00 kg/(m^2). --> cannot  stand to take weight measurements  No exam data present  No flowsheet data found.   GENERAL EXAM: Patient is in no distress; well developed, nourished and groomed; neck is supple  CARDIOVASCULAR: Regular rate and rhythm, SIGNIFICANT SYSTOLIC MURMUR, no carotid bruits  NEUROLOGIC: MENTAL STATUS: awake, alert, oriented to person, place and time, ("November 09, 2014"), REGISTERS 2/3 ON FIRST TRY, 3/3 ON SECOND TRY; RECALLS 3/3; recent and remote memory intact, normal attention and concentration, language fluent, comprehension intact, naming intact, fund of knowledge appropriate CRANIAL NERVE: no  papilledema on fundoscopic exam, pupils equal and reactive to light, visual fields full to confrontation, extraocular muscles intact, no nystagmus, facial sensation and strength symmetric, hearing intact, palate elevates symmetrically, uvula midline, shoulder shrug symmetric, tongue midline. SOFT SPOKEN, HYPOPHONIA, HOARSE VOICE MOTOR: ATROPHY; FRAIL APPEARING; COGWHEELING IN LUE > RUE; SIG BRADYKINESIA IN BUE AND BLE; BUE STRENGTRH 3-4; BLE 2-3; INTERMITTENT TREMOR IN LUE > RUE SENSORY: normal and symmetric to light touch COORDINATION: finger-nose-finger, fine finger movements SLOW REFLEXES: deep tendon reflexes TRACE IN BUE; ABSENT IN BLE GAIT/STATION: IN WHEELCHAIR; CANNOT STAND; SEVERE KYPHOSCOLIOSIS OF CERVICAL SPINE, WITH HEAD TILTED TO RIGHT AND FLEXED FORWARD     DIAGNOSTIC DATA (LABS, IMAGING, TESTING) - I reviewed patient records, labs, notes, testing and imaging myself where available.  Lab Results  Component Value Date   WBC 5.5 10/29/2014   HGB 12.2* 10/29/2014   HCT 36.1* 10/29/2014   MCV 79.5 10/29/2014   PLT 177 10/29/2014      Component Value Date/Time   NA 139 10/29/2014 2321   K 5.1 10/29/2014 2321   CL 105 10/29/2014 2321   CO2 25 10/29/2014 2321   GLUCOSE 83 10/29/2014 2321   BUN 40* 10/29/2014 2321   CREATININE 1.79* 10/29/2014 2321   CALCIUM 9.5 10/29/2014 2321    CALCIUM 9.2 04/02/2013 1007   PROT 7.8 10/29/2014 2321   ALBUMIN 4.2 10/29/2014 2321   AST 24 10/29/2014 2321   ALT 23 10/29/2014 2321   ALKPHOS 54 10/29/2014 2321   BILITOT 0.6 10/29/2014 2321   GFRNONAA 34* 10/29/2014 2321   GFRAA 40* 10/29/2014 2321   Lab Results  Component Value Date   CHOL 115 01/25/2012   HDL 35* 01/25/2012   LDLCALC 62 01/25/2012   TRIG 91 01/25/2012   CHOLHDL 3.3 01/25/2012   Lab Results  Component Value Date   HGBA1C 5.8* 01/25/2012   No results found for: FKCLEXNT70 Lab Results  Component Value Date   TSH 2.819 05/17/2010    01/25/12 TTE   - Left ventricle: The cavity size was normal. There was severe concentric hypertrophy. Systolic function was normal. The estimated ejection fraction was in the range of 55% to 65%. Wall motion was normal; there were noregional wall motion abnormalities. Features areconsistent with a pseudonormal left ventricular fillingpattern, with concomitant abnormal relaxation andincreased filling pressure (grade 2 diastolicdysfunction). - Aortic valve: Severe diffuse thickening and calcification. There was severe stenosis. Valve area: 1.27cm^2(VTI).Valve area: 1.17cm^2 (Vmax). - Mitral valve: Severely calcified annulus. Mildly thickened leaflets . Mild regurgitation. - Left atrium: The atrium was mildly dilated.  10/29/14 CT HEAD - No acute intracranial abnormality. [I reviewed images myself and agree with interpretation. -VRP]     ASSESSMENT AND PLAN  81 y.o. year old male here with history of hypertension, diabetes, end-stage renal disease status post kidney transplant, prostate cancer, severe aortic stenosis, here for evaluation of general physical and cognitive decline, weakness, tremor, loss of appetite, shortness of breath, fatigue, increasing falls. Symptoms have been gradually progressive over several years, especially over the last 1-2 months. Patient's generalized decline is likely multifactorial. There is  possibility of superimposed parkinsonism, Parkinson's disease versus dementia with Lewy bodies, that may be a factor. However given his severe other chronic medical conditions and generalized debility, with recent reports of hallucinations and confusion at night, I do not think to be a good candidate for additional medical therapy. His main complaint today is pain in his neck and spine. I would favor conservative approach, palliative approach, with  focus on pain management and symptom control.  Ddx: dementia with lewy bodies vs parkinson's disease + chronic medical conditions / debility (ESRD --> kidney transplant, severe aortic stenosis, afib)  PLAN: - recommend palliative care for pain mgmt, symptom control, advanced care planning - I do not recommend levodopa at this time, due to reports of confusion and hallucinations at night  Return for return to PCP.  I spent 60 minutes of face to face time with patient. Greater than 50% of time was spent in counseling and coordination of care with patient. In summary we discussed diagnoses, possible superimposed parkinsonism vs dementia with lewy bodies, treatment options, advanced care planning and palliative care.     Penni Bombard, MD 0/01/2375, 2:83 PM Certified in Neurology, Neurophysiology and Neuroimaging  [facility] [address] [place], [place] [address] [phone]

## 2014-11-10 ENCOUNTER — Ambulatory Visit: Payer: Medicare Other | Admitting: Podiatry

## 2014-11-11 ENCOUNTER — Encounter (HOSPITAL_COMMUNITY): Payer: PRIVATE HEALTH INSURANCE

## 2014-11-12 ENCOUNTER — Emergency Department (HOSPITAL_COMMUNITY)
Admission: EM | Admit: 2014-11-12 | Discharge: 2014-11-13 | Disposition: A | Discharge status: Home / Self Care | Payer: Medicare Other | Attending: Emergency Medicine | Admitting: Emergency Medicine

## 2014-11-12 ENCOUNTER — Encounter (HOSPITAL_COMMUNITY): Payer: Self-pay | Admitting: Emergency Medicine

## 2014-11-12 DIAGNOSIS — N186 End stage renal disease: Secondary | ICD-10-CM | POA: Insufficient documentation

## 2014-11-12 DIAGNOSIS — Z8546 Personal history of malignant neoplasm of prostate: Secondary | ICD-10-CM | POA: Insufficient documentation

## 2014-11-12 DIAGNOSIS — Z7409 Other reduced mobility: Secondary | ICD-10-CM

## 2014-11-12 DIAGNOSIS — N508 Other specified disorders of male genital organs: Secondary | ICD-10-CM | POA: Diagnosis not present

## 2014-11-12 DIAGNOSIS — N390 Urinary tract infection, site not specified: Secondary | ICD-10-CM | POA: Diagnosis not present

## 2014-11-12 DIAGNOSIS — Z87891 Personal history of nicotine dependence: Secondary | ICD-10-CM | POA: Diagnosis not present

## 2014-11-12 DIAGNOSIS — Z7982 Long term (current) use of aspirin: Secondary | ICD-10-CM | POA: Insufficient documentation

## 2014-11-12 DIAGNOSIS — R011 Cardiac murmur, unspecified: Secondary | ICD-10-CM | POA: Insufficient documentation

## 2014-11-12 DIAGNOSIS — M1990 Unspecified osteoarthritis, unspecified site: Secondary | ICD-10-CM | POA: Diagnosis not present

## 2014-11-12 DIAGNOSIS — Z8673 Personal history of transient ischemic attack (TIA), and cerebral infarction without residual deficits: Secondary | ICD-10-CM | POA: Insufficient documentation

## 2014-11-12 DIAGNOSIS — Z4801 Encounter for change or removal of surgical wound dressing: Secondary | ICD-10-CM | POA: Diagnosis present

## 2014-11-12 DIAGNOSIS — Z79899 Other long term (current) drug therapy: Secondary | ICD-10-CM | POA: Diagnosis not present

## 2014-11-12 DIAGNOSIS — Z794 Long term (current) use of insulin: Secondary | ICD-10-CM | POA: Diagnosis not present

## 2014-11-12 DIAGNOSIS — K219 Gastro-esophageal reflux disease without esophagitis: Secondary | ICD-10-CM | POA: Diagnosis not present

## 2014-11-12 DIAGNOSIS — I120 Hypertensive chronic kidney disease with stage 5 chronic kidney disease or end stage renal disease: Secondary | ICD-10-CM | POA: Insufficient documentation

## 2014-11-12 DIAGNOSIS — Z940 Kidney transplant status: Secondary | ICD-10-CM | POA: Diagnosis not present

## 2014-11-12 DIAGNOSIS — Z8701 Personal history of pneumonia (recurrent): Secondary | ICD-10-CM | POA: Diagnosis not present

## 2014-11-12 DIAGNOSIS — E119 Type 2 diabetes mellitus without complications: Secondary | ICD-10-CM | POA: Diagnosis not present

## 2014-11-12 DIAGNOSIS — L89151 Pressure ulcer of sacral region, stage 1: Secondary | ICD-10-CM

## 2014-11-12 LAB — CBC WITH DIFFERENTIAL/PLATELET
BASOS ABS: 0 10*3/uL (ref 0.0–0.1)
BASOS PCT: 0 % (ref 0–1)
Eosinophils Absolute: 0.1 10*3/uL (ref 0.0–0.7)
Eosinophils Relative: 2 % (ref 0–5)
HEMATOCRIT: 35.7 % — AB (ref 39.0–52.0)
Hemoglobin: 11.7 g/dL — ABNORMAL LOW (ref 13.0–17.0)
Lymphocytes Relative: 24 % (ref 12–46)
Lymphs Abs: 1.7 10*3/uL (ref 0.7–4.0)
MCH: 26.5 pg (ref 26.0–34.0)
MCHC: 32.8 g/dL (ref 30.0–36.0)
MCV: 80.8 fL (ref 78.0–100.0)
MONO ABS: 0.4 10*3/uL (ref 0.1–1.0)
Monocytes Relative: 6 % (ref 3–12)
NEUTROS ABS: 4.6 10*3/uL (ref 1.7–7.7)
NEUTROS PCT: 68 % (ref 43–77)
PLATELETS: 177 10*3/uL (ref 150–400)
RBC: 4.42 MIL/uL (ref 4.22–5.81)
RDW: 13.7 % (ref 11.5–15.5)
WBC: 6.8 10*3/uL (ref 4.0–10.5)

## 2014-11-12 LAB — COMPREHENSIVE METABOLIC PANEL
ALT: 23 U/L (ref 0–53)
AST: 20 U/L (ref 0–37)
Albumin: 4.2 g/dL (ref 3.5–5.2)
Alkaline Phosphatase: 56 U/L (ref 39–117)
Anion gap: 7 (ref 5–15)
BUN: 33 mg/dL — AB (ref 6–23)
CO2: 26 mmol/L (ref 19–32)
CREATININE: 1.64 mg/dL — AB (ref 0.50–1.35)
Calcium: 9.5 mg/dL (ref 8.4–10.5)
Chloride: 106 mmol/L (ref 96–112)
GFR calc non Af Amer: 38 mL/min — ABNORMAL LOW (ref >90)
GFR, EST AFRICAN AMERICAN: 44 mL/min — AB (ref >90)
Glucose, Bld: 103 mg/dL — ABNORMAL HIGH (ref 70–99)
POTASSIUM: 4.6 mmol/L (ref 3.5–5.1)
Sodium: 139 mmol/L (ref 135–145)
TOTAL PROTEIN: 7.5 g/dL (ref 6.0–8.3)
Total Bilirubin: 0.5 mg/dL (ref 0.3–1.2)

## 2014-11-12 MED ORDER — BACITRACIN ZINC 500 UNIT/GM EX OINT
TOPICAL_OINTMENT | Freq: Two times a day (BID) | CUTANEOUS | Status: DC
  Administered 2014-11-12: 1 via TOPICAL
  Filled 2014-11-12: qty 0.9

## 2014-11-12 NOTE — Discharge Instructions (Signed)
If you were given medicines take as directed.  If you are on coumadin or contraceptives realize their levels and effectiveness is altered by many different medicines.  If you have any reaction (rash, tongues swelling, other) to the medicines stop taking and see a physician.   Discuss nursing home with primary doctor.  Please follow up as directed and return to the ER or see a physician for new or worsening symptoms.  Thank you. Filed Vitals:   11/12/14 1928 11/12/14 2130 11/12/14 2152 11/12/14 2200  BP: 135/46 147/55 133/51 137/40  Pulse: 84 80 65 73  Temp: 97.7 F (36.5 C)     TempSrc: Oral     Resp: 16  18   SpO2: 100% 100% 93% 97%

## 2014-11-12 NOTE — ED Notes (Addendum)
The patient said he was brought in by his caregiver, Melisa Dalton due to sores on his sacrum.  The patient says they hurt and rates his pain 10/10.  According to Melisa, the sores have a bad smell and he is a diabetic.  She was also told by the patient's PCP to bring him in and make sure to get the infection treated.

## 2014-11-12 NOTE — ED Provider Notes (Signed)
CSN: [id number]     Arrival date & time 11/12/14  1921 History   First MD Initiated Contact with Patient 11/12/14 2059     Chief Complaint  Patient presents with  . Wound Check    The patient said he was brought in by his cargiver due to sores on his sacrum.  The patient says they hurt and rates his pain 10/10.     (Consider location/radiation/quality/duration/timing/severity/associated sxs/prior Treatment) HPI Comments: Patient presents with worsening pressure sores in the sacrum for the past week, history of pressure sores over these are multiple and more severe. Patient has home health and family to help care for him however patient refuses to roll over and to use appropriate bed. Family was out of town recently thus last care. Patient has been refusing nursing home however is slowly changing his mind. Family at bedside. No fevers or chills. Tender to palpation.  Patient is a 81 y.o. male presenting with wound check. The history is provided by the patient and a relative.  Wound Check Pertinent negatives include no chest pain, no abdominal pain, no headaches and no shortness of breath.    Past Medical History  Diagnosis Date  . H/O kidney transplant     End stage renal disease  . Diabetes mellitus   . Cancer     hx prostate cancer  . ESRD (end stage renal disease) 2011    hemodialysis started Sept 2011, cadaveric (double) renal  transplant Mar 2012 at [facility]  . Stroke   . GERD (gastroesophageal reflux disease)   . Hypertension   . Dysrhythmia     hx of atrial fibrilation  . Shortness of breath     " sometimes "  . Pneumonia 04/08/2012  . Heart murmur   . Arthritis     hands  . UTI (urinary tract infection)   . Osteoarthritis   . Iron deficiency    Past Surgical History  Procedure Laterality Date  . Kidney transplant      had both kidneys replaced at once   . Prostate surgery  approx. 2003    seed implant   Family History  Problem Relation Age of Onset  . Colon cancer  Mother    History  Substance Use Topics  . Smoking status: Former Smoker -- 15 years    Types: Cigarettes    Quit date: 01/24/1983  . Smokeless tobacco: Never Used  . Alcohol Use: No    Review of Systems  Constitutional: Positive for fatigue (chronic). Negative for fever and chills.  Respiratory: Negative for shortness of breath.   Cardiovascular: Negative for chest pain.  Gastrointestinal: Negative for vomiting and abdominal pain.  Genitourinary: Negative for dysuria and flank pain.  Musculoskeletal: Negative for back pain, neck pain and neck stiffness.  Skin: Positive for wound.  Neurological: Positive for weakness (chronic). Negative for light-headedness and headaches.      Allergies  Review of patient's allergies indicates no known allergies.  Home Medications   Prior to Admission medications   Medication Sig Start Date End Date Taking? Authorizing Provider  aspirin 81 MG tablet Take 81 mg by mouth daily.    Historical Provider, MD  calcium carbonate (TUMS EX) 750 MG chewable tablet Chew 1 tablet by mouth 3 (three) times daily.    Historical Provider, MD  dextrose (GLUTOSE) 40 % GEL Take 37.5 g by mouth as needed for low blood sugar (CBG less than 70 or CBG greater than 70 and next meal is more  than 1 hours away). 07/22/13   Janece Canterbury, MD  diltiazem (TIAZAC) 300 MG 24 hr capsule Take 300 mg by mouth daily.     Historical Provider, MD  diphenhydramine-acetaminophen (TYLENOL PM EXTRA STRENGTH) 25-500 MG TABS Take 2 tablets by mouth at bedtime as needed (sleep).     Historical Provider, MD  glipiZIDE (GLUCOTROL) 5 MG tablet Take 5 mg by mouth daily before breakfast.     Historical Provider, MD  guaiFENesin (MUCINEX) 600 MG 12 hr tablet Take 600 mg by mouth 2 (two) times daily as needed for cough.    Historical Provider, MD  hydrALAZINE (APRESOLINE) 100 MG tablet Take 50 mg by mouth 2 (two) times daily.     Historical Provider, MD  insulin aspart (NOVOLOG) 100 UNIT/ML  injection Inject 5-10 Units into the skin 3 (three) times daily before meals. cbg 200-300 5 units; 301-400 8 units; 401-500 10 units 07/22/13   Janece Canterbury, MD  loratadine (CLARITIN) 10 MG tablet Take 10 mg by mouth daily as needed. allergies    Historical Provider, MD  lubiprostone (AMITIZA) 24 MCG capsule Take 24 mcg by mouth 2 (two) times daily with a meal.    Historical Provider, MD  megestrol (MEGACE) 400 MG/10ML suspension Take 400 mg by mouth daily as needed. For appetite    Historical Provider, MD  mycophenolate (MYFORTIC) 180 MG EC tablet Take 180 mg by mouth 2 (two) times daily.    Historical Provider, MD  oxyCODONE-acetaminophen (PERCOCET) 5-325 MG per tablet Take 1 tablet by mouth every 8 (eight) hours as needed for moderate pain. 07/15/14   Pamella Pert, MD  polyethylene glycol (MIRALAX / GLYCOLAX) packet Take 17 g by mouth daily as needed. M/W/F for constipation    Historical Provider, MD  ranitidine (ZANTAC) 300 MG capsule Take 300 mg by mouth daily.    Historical Provider, MD  sodium bicarbonate 650 MG tablet Take 1,300 mg by mouth 2 (two) times daily.    Historical Provider, MD  tacrolimus (PROGRAF) 1 MG capsule Take 3 mg by mouth 2 (two) times daily.     Historical Provider, MD   BP 137/40 mmHg  Pulse 73  Temp(Src) 97.7 F (36.5 C) (Oral)  Resp 18  SpO2 97% Physical Exam  Constitutional: He is oriented to person, place, and time. He appears well-developed and well-nourished.  HENT:  Head: Normocephalic and atraumatic.  Eyes: Conjunctivae are normal. Right eye exhibits no discharge. Left eye exhibits no discharge.  Neck: Normal range of motion. Neck supple. No tracheal deviation present.  Cardiovascular: Normal rate and regular rhythm.   Pulmonary/Chest: Effort normal and breath sounds normal.  Abdominal: Soft. He exhibits no distension. There is no tenderness. There is no guarding.  Musculoskeletal: He exhibits no edema.  Neurological: He is alert and oriented to  person, place, and time.  Patient has general weakness on exam, mild slow to respond verbal which is at space line from a stroke history. Pupils equal bilateral. Neck supple.  Skin: Skin is warm. No rash noted.  Patient has 2 superficial skin ulcerations approximately 2-3 cm diameter no significant drainage, no surrounding erythema or induration, mild tender to palpation, no bone palpated or visualized.  Psychiatric: He has a normal mood and affect.  Nursing note and vitals reviewed.   ED Course  Procedures (including critical care time) Labs Review Labs Reviewed  CBC WITH DIFFERENTIAL/PLATELET - Abnormal; Notable for the following:    Hemoglobin 11.7 (*)    HCT 35.7 (*)  All other components within normal limits  COMPREHENSIVE METABOLIC PANEL - Abnormal; Notable for the following:    Glucose, Bld 103 (*)    BUN 33 (*)    Creatinine, Ser 1.64 (*)    GFR calc non Af Amer 38 (*)    GFR calc Af Amer 44 (*)    All other components within normal limits    Imaging Review No results found.   EKG Interpretation None      MDM   Final diagnoses:  Pressure ulcer, sacrum, stage I  Mobility poor   Patient presents with worsening sacral ulcers, no fever, baseline. Social Financial controller assisted with outpatient follow-up and discuss nursing home and further recommendations. Topical antibiotics at this time. Discussed regular roll over and supportive care for ulcers.  Results and differential diagnosis were discussed with the patient/parent/guardian. Close follow up outpatient was discussed, comfortable with the plan.   Medications - No data to display  Filed Vitals:   11/12/14 1928 11/12/14 2130 11/12/14 2152 11/12/14 2200  BP: 135/46 147/55 133/51 137/40  Pulse: 84 80 65 73  Temp: 97.7 F (36.5 C)     TempSrc: Oral     Resp: 16  18   SpO2: 100% 100% 93% 97%    Final diagnoses:  Pressure ulcer, sacrum, stage I  Mobility poor       Elnora Morrison, MD 11/12/14  2236

## 2014-11-13 NOTE — ED Notes (Signed)
Pt verbalized understanding and has no further questions.  

## 2014-11-14 NOTE — Progress Notes (Signed)
Late Entry: ED CM received consult from Dr. Reather Converse concerning possible [place] services. Patient presents to [facility] ED with complaints of generalized weakness. Reviewed patient record, Met with patient and granddaughter Sander Nephew [phone]  at bedside patient A&Ox3. Verbal consent given to discuss care in the presence of grand daughter Verified information with patient and family. Patient reports he  lives at home at home with elderly wife  as the primary care giver with private HHA for 12 hours per day as per grand daughter. Grand daugter's concerns is that patient continues to become weaker, she states, he is sitting for most of the day in an easy chair. She states, that in the past he would ambulate around the house with his walker. ED CM discussed the recommendation for Home Health services, RN/PT patient and family agreeable with the recommendations disposition plans for [facility] RN/PT.  Offered choice patient's grand daughter selected [facility]. Verified demographics, informed patient that a nurse from [facility] will contact patient 24-48 hours post discharge to arrange the initial assessment visit, patient agreeable with discharge plan. Updated Dr. Reather Converse and Rennert RN on Pod E on discharge plan they agree. Referral called to [place] [facility]ia[facility] 4/9 start of care date 4/11. No further ED CM needs identified.

## 2014-11-17 ENCOUNTER — Other Ambulatory Visit (HOSPITAL_COMMUNITY): Payer: Self-pay | Admitting: *Deleted

## 2014-11-18 ENCOUNTER — Encounter (HOSPITAL_COMMUNITY)
Admission: RE | Admit: 2014-11-18 | Discharge: 2014-11-18 | Disposition: A | Discharge status: Home / Self Care | Payer: Medicare Other | Source: Ambulatory Visit | Attending: Nephrology | Admitting: Nephrology

## 2014-11-18 DIAGNOSIS — Z940 Kidney transplant status: Secondary | ICD-10-CM | POA: Diagnosis not present

## 2014-11-18 DIAGNOSIS — N182 Chronic kidney disease, stage 2 (mild): Secondary | ICD-10-CM | POA: Insufficient documentation

## 2014-11-18 DIAGNOSIS — D638 Anemia in other chronic diseases classified elsewhere: Secondary | ICD-10-CM | POA: Diagnosis not present

## 2014-11-18 LAB — CBC
HCT: 32.1 % — ABNORMAL LOW (ref 39.0–52.0)
HEMOGLOBIN: 10.6 g/dL — AB (ref 13.0–17.0)
MCH: 26.5 pg (ref 26.0–34.0)
MCHC: 33 g/dL (ref 30.0–36.0)
MCV: 80.3 fL (ref 78.0–100.0)
PLATELETS: 208 10*3/uL (ref 150–400)
RBC: 4 MIL/uL — ABNORMAL LOW (ref 4.22–5.81)
RDW: 13.8 % (ref 11.5–15.5)
WBC: 6.1 10*3/uL (ref 4.0–10.5)

## 2014-11-18 LAB — RENAL FUNCTION PANEL
ALBUMIN: 4 g/dL (ref 3.5–5.2)
Anion gap: 9 (ref 5–15)
BUN: 33 mg/dL — ABNORMAL HIGH (ref 6–23)
CO2: 23 mmol/L (ref 19–32)
Calcium: 9.5 mg/dL (ref 8.4–10.5)
Chloride: 104 mmol/L (ref 96–112)
Creatinine, Ser: 1.55 mg/dL — ABNORMAL HIGH (ref 0.50–1.35)
GFR calc Af Amer: 47 mL/min — ABNORMAL LOW (ref >90)
GFR, EST NON AFRICAN AMERICAN: 41 mL/min — AB (ref >90)
Glucose, Bld: 191 mg/dL — ABNORMAL HIGH (ref 70–99)
Phosphorus: 3.1 mg/dL (ref 2.3–4.6)
Potassium: 4.9 mmol/L (ref 3.5–5.1)
Sodium: 136 mmol/L (ref 135–145)

## 2014-11-18 LAB — IRON AND TIBC
Iron: 72 ug/dL (ref 42–165)
SATURATION RATIOS: 39 % (ref 20–55)
TIBC: 187 ug/dL — ABNORMAL LOW (ref 215–435)
UIBC: 115 ug/dL — ABNORMAL LOW (ref 125–400)

## 2014-11-18 LAB — FERRITIN: FERRITIN: 1502 ng/mL — AB (ref 22–322)

## 2014-11-18 MED ORDER — DARBEPOETIN ALFA 100 MCG/0.5ML IJ SOSY
PREFILLED_SYRINGE | INTRAMUSCULAR | Status: AC
  Filled 2014-11-18: qty 0.5

## 2014-11-18 MED ORDER — DARBEPOETIN ALFA 100 MCG/0.5ML IJ SOSY
75.0000 ug | PREFILLED_SYRINGE | INTRAMUSCULAR | Status: DC
  Administered 2014-11-18: 75 ug via SUBCUTANEOUS

## 2014-11-19 LAB — CMV (CYTOMEGALOVIRUS) DNA ULTRAQUANT, PCR
CMV DNA Quant: NEGATIVE IU/mL
LOG10 CMV QN DNA PL: UNDETERMINED {Log_IU}/mL

## 2014-11-19 LAB — TACROLIMUS LEVEL: TACROLIMUS (FK506) - LABCORP: 4.2 ng/mL (ref 2.0–20.0)

## 2014-11-19 MED FILL — Darbepoetin Alfa Soln Prefilled Syringe 100 MCG/0.5ML: INTRAMUSCULAR | Qty: 0.5 | Status: AC

## 2014-11-24 ENCOUNTER — Ambulatory Visit: Payer: Medicare Other | Admitting: Podiatry

## 2014-12-02 ENCOUNTER — Ambulatory Visit: Payer: Medicare Other

## 2014-12-02 DIAGNOSIS — M79673 Pain in unspecified foot: Secondary | ICD-10-CM

## 2014-12-02 DIAGNOSIS — B351 Tinea unguium: Secondary | ICD-10-CM

## 2014-12-07 NOTE — Progress Notes (Signed)
Patient ID: Evan [name], male   DOB: 1931/10/06, 81 y.o.   MRN: [id number] Patient transfers from wheelchair to treatment chair   Subjective: This patient presents again complaining of painful toenails.   Objective: Patient is orientated 3 Patient transfers from wheelchair to treatment chair The toenails are brittle, hypertrophic, deformed, incurvated, discolored and tender to palpation 6-10  Assessment: Symptomatic onychomycosis 6-10  Plan: Debridement of toenails 10 without a bleeding  Reappoint 3 months

## 2014-12-16 ENCOUNTER — Encounter (HOSPITAL_COMMUNITY): Payer: PRIVATE HEALTH INSURANCE

## 2014-12-30 ENCOUNTER — Inpatient Hospital Stay (HOSPITAL_COMMUNITY): Payer: Medicare Other

## 2014-12-30 ENCOUNTER — Emergency Department (HOSPITAL_COMMUNITY): Payer: Medicare Other

## 2014-12-30 ENCOUNTER — Encounter (HOSPITAL_COMMUNITY): Payer: Self-pay

## 2014-12-30 ENCOUNTER — Inpatient Hospital Stay (HOSPITAL_COMMUNITY)
Admission: EM | Admit: 2014-12-30 | Discharge: 2015-01-04 | DRG: 291 | Disposition: A | Discharge status: Hospice | Payer: Medicare Other | Attending: Internal Medicine | Admitting: Internal Medicine

## 2014-12-30 DIAGNOSIS — I509 Heart failure, unspecified: Secondary | ICD-10-CM

## 2014-12-30 DIAGNOSIS — R627 Adult failure to thrive: Secondary | ICD-10-CM | POA: Diagnosis present

## 2014-12-30 DIAGNOSIS — R0602 Shortness of breath: Secondary | ICD-10-CM | POA: Diagnosis present

## 2014-12-30 DIAGNOSIS — R0689 Other abnormalities of breathing: Secondary | ICD-10-CM | POA: Diagnosis not present

## 2014-12-30 DIAGNOSIS — I69351 Hemiplegia and hemiparesis following cerebral infarction affecting right dominant side: Secondary | ICD-10-CM

## 2014-12-30 DIAGNOSIS — N183 Chronic kidney disease, stage 3 unspecified: Secondary | ICD-10-CM | POA: Diagnosis present

## 2014-12-30 DIAGNOSIS — Z8546 Personal history of malignant neoplasm of prostate: Secondary | ICD-10-CM | POA: Diagnosis not present

## 2014-12-30 DIAGNOSIS — J9601 Acute respiratory failure with hypoxia: Secondary | ICD-10-CM | POA: Diagnosis present

## 2014-12-30 DIAGNOSIS — Z794 Long term (current) use of insulin: Secondary | ICD-10-CM

## 2014-12-30 DIAGNOSIS — L89152 Pressure ulcer of sacral region, stage 2: Secondary | ICD-10-CM | POA: Diagnosis present

## 2014-12-30 DIAGNOSIS — I5031 Acute diastolic (congestive) heart failure: Secondary | ICD-10-CM | POA: Diagnosis not present

## 2014-12-30 DIAGNOSIS — Z8679 Personal history of other diseases of the circulatory system: Secondary | ICD-10-CM

## 2014-12-30 DIAGNOSIS — D649 Anemia, unspecified: Secondary | ICD-10-CM | POA: Diagnosis present

## 2014-12-30 DIAGNOSIS — Z7982 Long term (current) use of aspirin: Secondary | ICD-10-CM

## 2014-12-30 DIAGNOSIS — Z66 Do not resuscitate: Secondary | ICD-10-CM | POA: Diagnosis not present

## 2014-12-30 DIAGNOSIS — I129 Hypertensive chronic kidney disease with stage 1 through stage 4 chronic kidney disease, or unspecified chronic kidney disease: Secondary | ICD-10-CM | POA: Diagnosis present

## 2014-12-30 DIAGNOSIS — E1122 Type 2 diabetes mellitus with diabetic chronic kidney disease: Secondary | ICD-10-CM | POA: Diagnosis present

## 2014-12-30 DIAGNOSIS — Z87891 Personal history of nicotine dependence: Secondary | ICD-10-CM | POA: Diagnosis not present

## 2014-12-30 DIAGNOSIS — I350 Nonrheumatic aortic (valve) stenosis: Secondary | ICD-10-CM | POA: Diagnosis present

## 2014-12-30 DIAGNOSIS — Z79899 Other long term (current) drug therapy: Secondary | ICD-10-CM | POA: Diagnosis not present

## 2014-12-30 DIAGNOSIS — Z515 Encounter for palliative care: Secondary | ICD-10-CM | POA: Insufficient documentation

## 2014-12-30 DIAGNOSIS — R7989 Other specified abnormal findings of blood chemistry: Secondary | ICD-10-CM | POA: Diagnosis present

## 2014-12-30 DIAGNOSIS — Z940 Kidney transplant status: Secondary | ICD-10-CM

## 2014-12-30 DIAGNOSIS — I209 Angina pectoris, unspecified: Secondary | ICD-10-CM | POA: Insufficient documentation

## 2014-12-30 DIAGNOSIS — R0600 Dyspnea, unspecified: Secondary | ICD-10-CM | POA: Insufficient documentation

## 2014-12-30 DIAGNOSIS — R5381 Other malaise: Secondary | ICD-10-CM | POA: Diagnosis present

## 2014-12-30 DIAGNOSIS — I5033 Acute on chronic diastolic (congestive) heart failure: Principal | ICD-10-CM | POA: Diagnosis present

## 2014-12-30 DIAGNOSIS — Z8673 Personal history of transient ischemic attack (TIA), and cerebral infarction without residual deficits: Secondary | ICD-10-CM

## 2014-12-30 DIAGNOSIS — R0902 Hypoxemia: Secondary | ICD-10-CM

## 2014-12-30 DIAGNOSIS — I248 Other forms of acute ischemic heart disease: Secondary | ICD-10-CM | POA: Diagnosis present

## 2014-12-30 DIAGNOSIS — R778 Other specified abnormalities of plasma proteins: Secondary | ICD-10-CM

## 2014-12-30 DIAGNOSIS — E119 Type 2 diabetes mellitus without complications: Secondary | ICD-10-CM | POA: Diagnosis not present

## 2014-12-30 DIAGNOSIS — K219 Gastro-esophageal reflux disease without esophagitis: Secondary | ICD-10-CM | POA: Diagnosis present

## 2014-12-30 LAB — CBC
HEMATOCRIT: 25.9 % — AB (ref 39.0–52.0)
HEMOGLOBIN: 8.7 g/dL — AB (ref 13.0–17.0)
MCH: 26.8 pg (ref 26.0–34.0)
MCHC: 33.6 g/dL (ref 30.0–36.0)
MCV: 79.7 fL (ref 78.0–100.0)
Platelets: 219 10*3/uL (ref 150–400)
RBC: 3.25 MIL/uL — AB (ref 4.22–5.81)
RDW: 14.2 % (ref 11.5–15.5)
WBC: 9.4 10*3/uL (ref 4.0–10.5)

## 2014-12-30 LAB — RETICULOCYTES
RBC.: 3.04 MIL/uL — ABNORMAL LOW (ref 4.22–5.81)
RETIC CT PCT: 1.6 % (ref 0.4–3.1)
Retic Count, Absolute: 48.6 10*3/uL (ref 19.0–186.0)

## 2014-12-30 LAB — BASIC METABOLIC PANEL
ANION GAP: 8 (ref 5–15)
BUN: 33 mg/dL — AB (ref 6–20)
CO2: 22 mmol/L (ref 22–32)
Calcium: 8.6 mg/dL — ABNORMAL LOW (ref 8.9–10.3)
Chloride: 108 mmol/L (ref 101–111)
Creatinine, Ser: 1.48 mg/dL — ABNORMAL HIGH (ref 0.61–1.24)
GFR calc Af Amer: 50 mL/min — ABNORMAL LOW (ref >60)
GFR calc non Af Amer: 43 mL/min — ABNORMAL LOW (ref >60)
GLUCOSE: 189 mg/dL — AB (ref 65–99)
Potassium: 4.3 mmol/L (ref 3.5–5.1)
SODIUM: 138 mmol/L (ref 135–145)

## 2014-12-30 LAB — GLUCOSE, CAPILLARY
Glucose-Capillary: 112 mg/dL — ABNORMAL HIGH (ref 65–99)
Glucose-Capillary: 126 mg/dL — ABNORMAL HIGH (ref 65–99)
Glucose-Capillary: 101 mg/dL — ABNORMAL HIGH (ref 65–99)
Glucose-Capillary: 139 mg/dL — ABNORMAL HIGH (ref 65–99)

## 2014-12-30 LAB — TSH: TSH: 2.981 u[IU]/mL (ref 0.350–4.500)

## 2014-12-30 LAB — I-STAT TROPONIN, ED
Troponin i, poc: 0.35 ng/mL (ref 0.00–0.08)
Troponin i, poc: 0.3 ng/mL (ref 0.00–0.08)

## 2014-12-30 LAB — VITAMIN B12: Vitamin B-12: 523 pg/mL (ref 180–914)

## 2014-12-30 LAB — D-DIMER, QUANTITATIVE: D-Dimer, Quant: 1.15 ug/mL-FEU — ABNORMAL HIGH (ref 0.00–0.48)

## 2014-12-30 LAB — IRON AND TIBC
IRON: 16 ug/dL — AB (ref 45–182)
Saturation Ratios: 8 % — ABNORMAL LOW (ref 17.9–39.5)
TIBC: 190 ug/dL — AB (ref 250–450)
UIBC: 174 ug/dL

## 2014-12-30 LAB — ABO/RH: ABO/RH(D): O NEG

## 2014-12-30 LAB — POC OCCULT BLOOD, ED: Fecal Occult Bld: POSITIVE — AB

## 2014-12-30 LAB — FERRITIN: FERRITIN: 336 ng/mL (ref 24–336)

## 2014-12-30 LAB — TROPONIN I: Troponin I: 0.27 ng/mL — ABNORMAL HIGH (ref <0.031)

## 2014-12-30 LAB — PREPARE RBC (CROSSMATCH)

## 2014-12-30 LAB — FOLATE: Folate: 5.7 ng/mL — ABNORMAL LOW (ref >5.9)

## 2014-12-30 LAB — BRAIN NATRIURETIC PEPTIDE: B Natriuretic Peptide: 629.4 pg/mL — ABNORMAL HIGH (ref 0.0–100.0)

## 2014-12-30 MED ORDER — HEPARIN SODIUM (PORCINE) 5000 UNIT/ML IJ SOLN
5000.0000 [IU] | Freq: Three times a day (TID) | INTRAMUSCULAR | Status: DC
  Administered 2014-12-30 – 2015-01-03 (×13): 5000 [IU] via SUBCUTANEOUS
  Filled 2014-12-30 (×17): qty 1

## 2014-12-30 MED ORDER — SODIUM BICARBONATE 650 MG PO TABS
650.0000 mg | ORAL_TABLET | Freq: Two times a day (BID) | ORAL | Status: DC
  Administered 2014-12-30 – 2015-01-04 (×10): 650 mg via ORAL
  Filled 2014-12-30 (×13): qty 1

## 2014-12-30 MED ORDER — ASPIRIN 81 MG PO CHEW
324.0000 mg | CHEWABLE_TABLET | Freq: Once | ORAL | Status: AC
  Administered 2014-12-30: 324 mg via ORAL
  Filled 2014-12-30: qty 4

## 2014-12-30 MED ORDER — FAMOTIDINE 10 MG PO TABS
10.0000 mg | ORAL_TABLET | Freq: Every day | ORAL | Status: DC
  Administered 2014-12-30 – 2015-01-04 (×5): 10 mg via ORAL
  Filled 2014-12-30 (×6): qty 1

## 2014-12-30 MED ORDER — TACROLIMUS 1 MG PO CAPS
1.0000 mg | ORAL_CAPSULE | Freq: Two times a day (BID) | ORAL | Status: DC
  Administered 2014-12-30 – 2015-01-04 (×10): 1 mg via ORAL
  Filled 2014-12-30 (×12): qty 1

## 2014-12-30 MED ORDER — INSULIN ASPART 100 UNIT/ML ~~LOC~~ SOLN
0.0000 [IU] | Freq: Every day | SUBCUTANEOUS | Status: DC
  Administered 2015-01-03: 2 [IU] via SUBCUTANEOUS

## 2014-12-30 MED ORDER — SODIUM CHLORIDE 0.9 % IV SOLN: Freq: Once | INTRAVENOUS | Status: DC

## 2014-12-30 MED ORDER — MYCOPHENOLATE SODIUM 180 MG PO TBEC
180.0000 mg | DELAYED_RELEASE_TABLET | Freq: Two times a day (BID) | ORAL | Status: DC
  Administered 2014-12-30 – 2015-01-04 (×10): 180 mg via ORAL
  Filled 2014-12-30 (×12): qty 1

## 2014-12-30 MED ORDER — OXYCODONE-ACETAMINOPHEN 5-325 MG PO TABS
1.0000 | ORAL_TABLET | Freq: Three times a day (TID) | ORAL | Status: DC | PRN
  Administered 2014-12-30 (×2): 1 via ORAL
  Filled 2014-12-30 (×2): qty 1

## 2014-12-30 MED ORDER — SODIUM CHLORIDE 0.9 % IJ SOLN: 3.0000 mL | INTRAMUSCULAR | Status: DC | PRN

## 2014-12-30 MED ORDER — FUROSEMIDE 10 MG/ML IJ SOLN
60.0000 mg | Freq: Once | INTRAMUSCULAR | Status: AC
  Administered 2014-12-30: 60 mg via INTRAVENOUS
  Filled 2014-12-30: qty 6

## 2014-12-30 MED ORDER — MEGESTROL ACETATE 400 MG/10ML PO SUSP
400.0000 mg | Freq: Every day | ORAL | Status: DC
  Administered 2014-12-30 – 2015-01-04 (×5): 400 mg via ORAL
  Filled 2014-12-30 (×7): qty 10

## 2014-12-30 MED ORDER — INSULIN ASPART 100 UNIT/ML ~~LOC~~ SOLN
0.0000 [IU] | Freq: Three times a day (TID) | SUBCUTANEOUS | Status: DC
Start: 2014-12-30 — End: 2015-01-04
  Administered 2014-12-30: 1 [IU] via SUBCUTANEOUS
  Administered 2014-12-31: 2 [IU] via SUBCUTANEOUS
  Administered 2014-12-31: 1 [IU] via SUBCUTANEOUS
  Administered 2015-01-01 – 2015-01-02 (×6): 2 [IU] via SUBCUTANEOUS
  Administered 2015-01-03: 3 [IU] via SUBCUTANEOUS
  Administered 2015-01-03 – 2015-01-04 (×3): 2 [IU] via SUBCUTANEOUS

## 2014-12-30 MED ORDER — ALUM & MAG HYDROXIDE-SIMETH 200-200-20 MG/5ML PO SUSP
30.0000 mL | Freq: Once | ORAL | Status: AC
  Administered 2014-12-30: 30 mL via ORAL
  Filled 2014-12-30: qty 30

## 2014-12-30 MED ORDER — SODIUM CHLORIDE 0.9 % IV SOLN: 250.0000 mL | INTRAVENOUS | Status: DC | PRN

## 2014-12-30 MED ORDER — SODIUM CHLORIDE 0.9 % IJ SOLN
3.0000 mL | Freq: Two times a day (BID) | INTRAMUSCULAR | Status: DC
  Administered 2014-12-30 – 2015-01-04 (×11): 3 mL via INTRAVENOUS

## 2014-12-30 MED ORDER — TECHNETIUM TO 99M ALBUMIN AGGREGATED
6.0000 | Freq: Once | INTRAVENOUS | Status: AC | PRN
  Administered 2014-12-30: 6 via INTRAVENOUS

## 2014-12-30 MED ORDER — DILTIAZEM HCL ER BEADS 300 MG PO CP24
300.0000 mg | ORAL_CAPSULE | Freq: Every day | ORAL | Status: DC
Start: 2014-12-30 — End: 2015-01-04
  Administered 2014-12-30 – 2015-01-04 (×5): 300 mg via ORAL
  Filled 2014-12-30 (×6): qty 1

## 2014-12-30 MED ORDER — TECHNETIUM TC 99M DIETHYLENETRIAME-PENTAACETIC ACID: 40.0000 | Freq: Once | INTRAVENOUS | Status: AC | PRN

## 2014-12-30 MED ORDER — HYDRALAZINE HCL 25 MG PO TABS
25.0000 mg | ORAL_TABLET | Freq: Two times a day (BID) | ORAL | Status: DC
Start: 2014-12-30 — End: 2015-01-03
  Administered 2014-12-30 – 2015-01-03 (×4): 25 mg via ORAL
  Filled 2014-12-30 (×10): qty 1

## 2014-12-30 MED ORDER — ACETAMINOPHEN 325 MG PO TABS: 650.0000 mg | ORAL_TABLET | ORAL | Status: DC | PRN

## 2014-12-30 MED ORDER — ASPIRIN 81 MG PO CHEW
81.0000 mg | CHEWABLE_TABLET | Freq: Every day | ORAL | Status: DC
  Administered 2014-12-30 – 2015-01-04 (×5): 81 mg via ORAL
  Filled 2014-12-30 (×8): qty 1

## 2014-12-30 MED ORDER — LORATADINE 10 MG PO TABS: 10.0000 mg | ORAL_TABLET | Freq: Every day | ORAL | Status: DC | PRN

## 2014-12-30 MED ORDER — ONDANSETRON HCL 4 MG/2ML IJ SOLN: 4.0000 mg | Freq: Four times a day (QID) | INTRAMUSCULAR | Status: DC | PRN

## 2014-12-30 NOTE — Consult Note (Signed)
CARDIOLOGY CONSULT NOTE       Patient ID: Evan [name] MRN: [id number] DOB/AGE: 79-Mar-1933 79 y.o.  Admit date: 12/30/2014 Referring Physician:  Hospitalist Primary Physician: Evan Spurling, MD Primary Cardiologist:  Evan [name] Reason for Consultation:  Dyspnea   Active Problems:   Type 2 diabetes mellitus   History of CVA (cerebrovascular accident)/left pontine infarct June 20,2013   History of renal transplant   H/O atrial fibrillation without current medication   Aortic stenosis, severe   FTT (failure to thrive) in adult   Acute on chronic diastolic CHF (congestive heart failure), NYHA class 2   Diabetes mellitus type 2, controlled   CKD (chronic kidney disease), stage III   Symptomatic anemia   Acute respiratory failure with hypoxia   Decubitus ulcer of sacral region, stage 2   HPI:   81 y.o. debilitated black male currently in nursing home.  Admitted with dyspnea.  "[name]  History of dialysis with renal transplant and Cr now 1.48  CXR with likely pulmonary edema and BNP in 600 range  No chest pain Noted to have severe AS 3 years ago but not thought to be a candidate for surgical intervention back then.  Hct 25.9 and troponin .27   ROS All other systems reviewed and negative except as noted above  Past Medical History  Diagnosis Date  . H/O kidney transplant     End stage renal disease  . Diabetes mellitus   . Cancer     hx prostate cancer  . ESRD (end stage renal disease) 2011    hemodialysis started Sept 2011, cadaveric (double) renal  transplant Mar 2012 at [facility]  . Stroke   . GERD (gastroesophageal reflux disease)   . Hypertension   . Dysrhythmia     hx of atrial fibrilation  . Shortness of breath     " sometimes "  . Pneumonia 04/08/2012  . Heart  murmur   . Arthritis     hands  . UTI (urinary tract infection)   . Osteoarthritis   . Iron deficiency     Family History  Problem Relation Age of Onset  . Colon cancer Mother     History   Social History  . Marital Status: Married    Spouse Name: Evan [name]  . Number of Children: 1  . Years of Education: BA, MS   Occupational History  .     Social History Main Topics  . Smoking status: Former Smoker -- 15 years    Types: Cigarettes    Quit date: 01/24/1983  . Smokeless tobacco: Never Used  . Alcohol Use: No  . Drug Use: No  . Sexual Activity: Not Currently   Other Topics Concern  . Not on file   Social History Narrative   Pt lives at home with his spouse.   Caffeine Use: Drinks coffee occasionally.     Past Surgical History  Procedure Laterality Date  . Kidney transplant      had both kidneys replaced at once   . Prostate surgery  approx. 2003    seed implant     . sodium chloride   Intravenous Once  . furosemide  60 mg Intravenous Once  . insulin aspart  0-5 Units Subcutaneous QHS  . insulin aspart  0-9 Units Subcutaneous TID WC      Physical Exam: Blood pressure 114/39, pulse 80, temperature 99.9 F (37.7 C), temperature source Rectal, resp. rate 22, height 5' 11.5" (1.816 m), weight 81.647 kg (180 lb), SpO2 99 %.   Affect appropriate Chronically ill black male  HEENT: normal Neck supple with no adenopathy JVP normal no bruits no thyromegaly Lungs clear with no wheezing and good diaphragmatic motion Heart:  S1/S2 absent severe AS murmur, no rub, gallop or click PMI normal Abdomen: benighn, BS positve, no tenderness, no AAA no bruit.  No HSM or HJR Distal pulses intact with no bruits Plus one bilateral edema Neuro right hemiparesis  Skin warm and dry Generalized weakness   [name]:   Lab Results  Component Value Date   WBC 9.4 12/30/2014   HGB 8.7* 12/30/2014   HCT 25.9* 12/30/2014   MCV 79.7 12/30/2014   PLT 219 12/30/2014    Recent  [name] Lab 12/30/14 0404  NA 138  K 4.3  CL 108  CO2 22  BUN 33*  CREATININE 1.48*  CALCIUM 8.6*  GLUCOSE 189*   Lab Results  Component Value Date   CKTOTAL 94 01/24/2012   CKMB 3.1 01/24/2012   TROPONINI 0.27* 12/30/2014    Lab Results  Component Value Date   CHOL 115 01/25/2012   Lab Results  Component Value Date   HDL 35* 01/25/2012   Lab Results  Component Value Date   LDLCALC 62 01/25/2012   Lab Results  Component Value Date   TRIG 91 01/25/2012   Lab Results  Component Value Date   CHOLHDL 3.3 01/25/2012   No results found for: LDLDIRECT    Radiology: Dg Chest Port 1 View  12/30/2014   CLINICAL DATA:  Shortness of breath  EXAM: PORTABLE CHEST - 1 VIEW  COMPARISON:  10/29/2014  FINDINGS: There is bilateral fairly symmetric airspace disease, not seen previously. Possible small pleural effusions. No cardiomegaly. Stable mediastinal contours.  Limited evaluation the apices due to patient positioning.  No acute osseous findings.  IMPRESSION: Bilateral airspace disease, favor edema over multi focal infection.   Electronically Signed   By: Evan [name] M.D.   On: 12/30/2014 03:55    EKG:  SR rate 91 nonspecific ST changes    ASSESSMENT AND PLAN:  Aortic Stenosis:  I suspect biggest issue leading to CHF and dyspnea is critical AS  Echo in 2013 showed mean gradient of 49 mmHg and peak of 80 mmHg  This is undoubetly Worse now.  Dr Evan Julian is on vacation Patient does not really look like a TAVR candidate either  W/U for this would involve cath and cardiac CT with dye loads that would make CRF with old transplanted kidneys at risk.  For now would transfuse one unit with lasix.  W/U any potential GI bleed.  Update echo since its been 3 years.  May ask Dr Evan [name] To review chart.  Suspect he will be a palliative care consult.  SignedJenkins [name] 12/30/2014, 8:00 AM

## 2014-12-30 NOTE — Progress Notes (Signed)
PT Cancellation Note  Patient Details Name: Drayce Tawil MRN: [id number] DOB: 10-26-1931   Cancelled Treatment:    Reason Eval/Treat Not Completed: Medical issues which prohibited therapy (just admitted this AM,   D -Dimer elevated, to have  VQ scan. will return in AM for eval.)   Claretha Cooper 12/30/2014, 12:55 PM Tresa Endo PT [phone]

## 2014-12-30 NOTE — Consult Note (Addendum)
WOC wound consult note Reason for Consult: Consult requested for buttocks.  Pt does not have an open wound or drainage; pink dry scar tissue intact where previous wound has healed across bilat buttocks. This can breakdown easily if it has constant moisture or pressure. Dressing procedure/placement/frequency: Barrier cream to protect and repel moisture. Discussed plan of care with patient and he verbalizes understanding. Please re-consult if further assistance is needed.  Thank-you,  Julien Girt MSN, [place], [place], [place], [place]

## 2014-12-30 NOTE — Progress Notes (Signed)
Utilization review completed.  

## 2014-12-30 NOTE — ED Notes (Signed)
Pt here for SOB, onset this am when he woke up, from [facility], pt denies pain. Informed EMS he just couldn't cough hard enough to get anything up, pt is warm to touch, alert and oriented, upper airway congestion noted.

## 2014-12-30 NOTE — Consult Note (Signed)
Reason for Consult: Guaiac positivity Referring Physician: Hospital team  Evan [name] is an 81 y.o. male.  HPI: Patient known to me from previous GI workup although not seen in some time and he has seen some blood in his underwear but it sounds like it's from his decubitus and not his rectum and I wonder if this guaiac positivity might be from that although he is on an aspirin a day but supposedly no other blood thinners or arthritis pills and he occasionally has dysphagia to pills and came to the hospital because he felt he was choking but he has not been having a swallowing problem otherwise until last night and he does not have any abdominal pain and his chronic constipation is unchanged and MiraLAX seems to help and his colonoscopy in 2013 and endoscopy in 2012 were reviewed as well as the hospital computer chart and our office computer chart and the case was discussed with the medical team and he is feeling better than when he was admitted and his main complaint right now is dry lips although I offered him some water and he did not want any  Past Medical History  Diagnosis Date  . H/O kidney transplant     End stage renal disease  . Diabetes mellitus   . Cancer     hx prostate cancer  . ESRD (end stage renal disease) 2011    hemodialysis started Sept 2011, cadaveric (double) renal  transplant Mar 2012 at [facility]  . Stroke   . GERD (gastroesophageal reflux disease)   . Hypertension   . Dysrhythmia     hx of atrial fibrilation  . Shortness of breath     " sometimes "  . Pneumonia 04/08/2012  . Heart murmur   . Arthritis     hands  . UTI (urinary tract infection)   . Osteoarthritis   . Iron deficiency     Past Surgical History  Procedure Laterality Date  . Kidney transplant      had both kidneys replaced at once   . Prostate surgery  approx. 2003    seed implant  . Av fistula placement Left     Family History  Problem Relation Age of Onset  . Colon cancer Mother      Social History:  reports that he quit smoking about 31 years ago. His smoking use included Cigarettes. He quit after 15 years of use. He has never used smokeless tobacco. He reports that he does not drink alcohol or use illicit drugs.  Allergies: No Known Allergies  Medications: I have reviewed the patient's current medications.  Results for orders placed or performed during the hospital encounter of 12/30/14 (from the past 48 hour(s))  CBC     Status: Abnormal   Collection Time: 12/30/14  4:04 AM  Result Value Ref Range   WBC 9.4 4.0 - 10.5 K/uL   RBC 3.25 (L) 4.22 - 5.81 MIL/uL   Hemoglobin 8.7 (L) 13.0 - 17.0 g/dL   HCT 25.9 (L) 39.0 - 52.0 %   MCV 79.7 78.0 - 100.0 fL   MCH 26.8 26.0 - 34.0 pg   MCHC 33.6 30.0 - 36.0 g/dL   RDW 14.2 11.5 - 15.5 %   Platelets 219 150 - 400 K/uL  Basic metabolic panel     Status: Abnormal   Collection Time: 12/30/14  4:04 AM  Result Value Ref Range   Sodium 138 135 - 145 mmol/L   Potassium 4.3 3.5 -  5.1 mmol/L   Chloride 108 101 - 111 mmol/L   CO2 22 22 - 32 mmol/L   Glucose, Bld 189 (H) 65 - 99 mg/dL   BUN 33 (H) 6 - 20 mg/dL   Creatinine, Ser 1.48 (H) 0.61 - 1.24 mg/dL   Calcium 8.6 (L) 8.9 - 10.3 mg/dL   GFR calc non Af Amer 43 (L) >60 mL/min   GFR calc Af Amer 50 (L) >60 mL/min    Comment: (NOTE) The eGFR has been calculated using the CKD EPI equation. This calculation has not been validated in all clinical situations. eGFR's persistently <60 mL/min signify possible Chronic Kidney Disease.    Anion gap 8 5 - 15  BNP (order ONLY if patient complains of dyspnea/SOB AND you have documented it for THIS visit)     Status: Abnormal   Collection Time: 12/30/14  4:04 AM  Result Value Ref Range   B Natriuretic Peptide 629.4 (H) 0.0 - 100.0 pg/mL  Troponin I     Status: Abnormal   Collection Time: 12/30/14  4:04 AM  Result Value Ref Range   Troponin I 0.27 (H) <0.031 ng/mL    Comment:        PERSISTENTLY INCREASED TROPONIN VALUES IN  THE RANGE OF 0.04-0.49 ng/mL CAN BE SEEN IN:       -UNSTABLE ANGINA       -CONGESTIVE HEART FAILURE       -MYOCARDITIS       -CHEST TRAUMA       -ARRYHTHMIAS       -LATE PRESENTING MYOCARDIAL INFARCTION       -COPD   CLINICAL FOLLOW-UP RECOMMENDED.   D-dimer, quantitative     Status: Abnormal   Collection Time: 12/30/14  4:04 AM  Result Value Ref Range   D-Dimer, Quant 1.15 (H) 0.00 - 0.48 ug/mL-FEU    Comment:        AT THE INHOUSE ESTABLISHED CUTOFF VALUE OF 0.48 ug/mL FEU, THIS ASSAY HAS BEEN DOCUMENTED IN THE LITERATURE TO HAVE A SENSITIVITY AND NEGATIVE PREDICTIVE VALUE OF AT LEAST 98 TO 99%.  THE TEST RESULT SHOULD BE CORRELATED WITH AN ASSESSMENT OF THE CLINICAL PROBABILITY OF DVT / VTE.   I-stat troponin, ED  (not at [facility], [facility])     Status: Abnormal   Collection Time: 12/30/14  4:08 AM  Result Value Ref Range   Troponin i, poc 0.35 (HH) 0.00 - 0.08 ng/mL   Comment NOTIFIED PHYSICIAN    Comment 3            Comment: Due to the release kinetics of cTnI, a negative result within the first hours of the onset of symptoms does not rule out myocardial infarction with certainty. If myocardial infarction is still suspected, repeat the test at appropriate intervals.   I-stat troponin, ED     Status: Abnormal   Collection Time: 12/30/14  4:22 AM  Result Value Ref Range   Troponin i, poc 0.30 (HH) 0.00 - 0.08 ng/mL   Comment NOTIFIED PHYSICIAN    Comment 3            Comment: Due to the release kinetics of cTnI, a negative result within the first hours of the onset of symptoms does not rule out myocardial infarction with certainty. If myocardial infarction is still suspected, repeat the test at appropriate intervals.   POC occult blood, ED     Status: Abnormal   Collection Time: 12/30/14  5:24 AM  Result Value Ref Range  Fecal Occult Bld POSITIVE (A) NEGATIVE  Type and screen     Status: None (Preliminary result)   Collection Time: 12/30/14  6:11 AM  Result  Value Ref Range   ABO/RH(D) O NEG    Antibody Screen NEG    Sample Expiration 01/02/2015    Unit Number [id number]    Blood Component Type RBC LR PHER2    Unit division 00    Status of Unit ALLOCATED    Transfusion Status OK TO TRANSFUSE    Crossmatch Result Compatible   ABO/Rh     Status: None   Collection Time: 12/30/14  6:11 AM  Result Value Ref Range   ABO/RH(D) O NEG   Prepare RBC     Status: None   Collection Time: 12/30/14  7:49 AM  Result Value Ref Range   Order Confirmation ORDER PROCESSED BY BLOOD BANK   Glucose, capillary     Status: Abnormal   Collection Time: 12/30/14  9:14 AM  Result Value Ref Range   Glucose-Capillary 112 (H) 65 - 99 mg/dL  Vitamin B12     Status: None   Collection Time: 12/30/14  9:22 AM  Result Value Ref Range   Vitamin B-12 523 180 - 914 pg/mL    Comment: (NOTE) This assay is not validated for testing neonatal or myeloproliferative syndrome specimens for Vitamin B12 levels.   Folate     Status: Abnormal   Collection Time: 12/30/14  9:22 AM  Result Value Ref Range   Folate 5.7 (L) >5.9 ng/mL  Iron and TIBC     Status: Abnormal   Collection Time: 12/30/14  9:22 AM  Result Value Ref Range   Iron 16 (L) 45 - 182 ug/dL   TIBC 190 (L) 250 - 450 ug/dL   Saturation Ratios 8 (L) 17.9 - 39.5 %   UIBC 174 ug/dL  Ferritin     Status: None   Collection Time: 12/30/14  9:22 AM  Result Value Ref Range   Ferritin 336 24 - 336 ng/mL  Reticulocytes     Status: Abnormal   Collection Time: 12/30/14  9:22 AM  Result Value Ref Range   Retic Ct Pct 1.6 0.4 - 3.1 %   RBC. 3.04 (L) 4.22 - 5.81 MIL/uL   Retic Count, Manual 48.6 19.0 - 186.0 K/uL  TSH     Status: None   Collection Time: 12/30/14  9:22 AM  Result Value Ref Range   TSH 2.981 0.350 - 4.500 uIU/mL  Glucose, capillary     Status: Abnormal   Collection Time: 12/30/14 12:53 PM  Result Value Ref Range   Glucose-Capillary 126 (H) 65 - 99 mg/dL  Glucose, capillary     Status: Abnormal    Collection Time: 12/30/14  5:15 PM  Result Value Ref Range   Glucose-Capillary 101 (H) 65 - 99 mg/dL    Dg Chest Port 1 View  12/30/2014   CLINICAL DATA:  Shortness of breath  EXAM: PORTABLE CHEST - 1 VIEW  COMPARISON:  10/29/2014  FINDINGS: There is bilateral fairly symmetric airspace disease, not seen previously. Possible small pleural effusions. No cardiomegaly. Stable mediastinal contours.  Limited evaluation the apices due to patient positioning.  No acute osseous findings.  IMPRESSION: Bilateral airspace disease, favor edema over multi focal infection.   Electronically Signed   By: Monte Fantasia M.D.   On: 12/30/2014 03:55    ROS currently negative except above and in the past he and his wife and I had talked  about follow-up colonoscopy but have elected to hold off because of his multiple other medical issues Blood pressure 133/46, pulse 80, temperature 97.8 F (36.6 C), temperature source Oral, resp. rate 18, height _0  (1.803 m), weight 84.369 kg (186 lb), SpO2 100 %. Physical Exam pleasant gentleman in no acute distress obviously multiple medical problems very stiff from stroke and Parkinson's lungs are pertinent for decreased breath sounds and decreased heart sounds abdomen is soft nontender good bowel sounds labs reviewed anemia pertinent for chronic disease and not iron deficiency and stools guaiac positive but no signs of active bleeding  Assessment/Plan: Multiple medical problems possibly some dysphagia guaiac positivity anemia of chronic disease Plan: He might need to speech therapy people to reevaluate his swallowing and call us back if there is an esophageal component otherwise might consider a CT of the abdomen and pelvis just to rule out anything significant might need attention and only proceed with GI workup if signs of ongoing bleeding or a significant abnormality that requires biopsy and I or my partner will check on tomorrow  [facility] 12/30/2014, 5:23 PM

## 2014-12-30 NOTE — H&P (Signed)
[facility] History and Physical                                                                                    Evan [name], is a 81 y.o. male  MRN: [id number]   DOB - 09-17-1931  Admit Date - 12/30/2014  Outpatient Primary MD for the patient is Foye Spurling, MD  Referring MD: Dina Rich / ER  Consulting MDs: Johnsie Cancel / Cardiology; [facility] / Gastroenterology  With History of -  Past Medical History  Diagnosis Date  . H/O kidney transplant     End stage renal disease  . Diabetes mellitus   . Cancer     hx prostate cancer  . ESRD (end stage renal disease) 2011    hemodialysis started Sept 2011, cadaveric (double) renal  transplant Mar 2012 at [facility]  . Stroke   . GERD (gastroesophageal reflux disease)   . Hypertension   . Dysrhythmia     hx of atrial fibrilation  . Shortness of breath     " sometimes "  . Pneumonia 04/08/2012  . Heart murmur   . Arthritis     hands  . UTI (urinary tract infection)   . Osteoarthritis   . Iron deficiency       Past Surgical History  Procedure Laterality Date  . Kidney transplant      had both kidneys replaced at once   . Prostate surgery  approx. 2003    seed implant    in for   Chief Complaint  Patient presents with  . Shortness of Breath     HPI This is a very pleasant and soft-spoken 81 year old male patient with history of diabetes, chronic kidney disease stage III status post renal transplant 2012, paroxysmal atrial fibrillation on rate control agents only, remote CVA with residual right-sided hemiparesis, hypertension, grade 2 diastolic dysfunction as well as severe aortic stenosis (based on echocardiogram in 2013), and sacral decubitus. He was sent over from the skilled nursing facility due to acute onset of shortness of breath. Patient reports what he describes as a coughing spell overnight but was unable to produce any phlegm. After this he developed difficulty breathing. Patient is minimally ambulatory at baseline  and has to walk with assistive devices as well as people helping him because of his residual right-sided hemiparesis. He does report some chronic upper back and neck pain since his CVA. He typically has to sleep upright in a chair at the nursing facility because of the neck and back pain stating that the hospital bed is uncomfortable. He reports he does have chronic anemia and periodically has to come to short stay to receive transfusions. He also is aware of a decubitus on his sacrum and states he is not getting up as much as he did at home at the nursing facility. He denies any chest pain or orthopnea.  Upon initial presentation to the ER patient was afebrile but was quite tachypnea with a respiratory rate of 30, heart rate was in the 90s but was sinus, BP was 141/47, room air saturations were less than 92% so oxygen was applied and saturations were 93% on 2 L.  Chest x-ray completed in the ER revealed bilateral airspace disease consistent with edema. BNP was 629, troponins have been measured several times that have ranged between 0.27 and 0.35. EKG without any acute ischemic changes and unchanged from prior EKGs in the past 12 months. D-dimer was elevated at 1.15. BUN 33 and creatinine 1.48 which was around the patient on, glucose 189, hemoglobin has drifted down to 8.7 from a previous baseline of 12.2 in March 2016. If you have serial hemoglobins in April demonstrated hemoglobin was down to 10.6 on 11/18/14. Stool itself was brown but was Hemoccult-positive. Cardiology has been consulted.   Review of Systems   In addition to the HPI above,  No Fever-chills, myalgias or other constitutional symptoms No Headache, changes with Vision or hearing, new weakness, tingling, numbness in any extremity, No reported problems swallowing food or Liquids, indigestion/reflux No Chest pain, palpitations, orthopnea or DOE No Abdominal pain, N/V; no melena or hematochezia, no dark tarry stools,  No dysuria, hematuria or  flank pain No new skin rashes, lesions, masses or bruises, No new joints pains-aches No recent weight gain or loss No polyuria, polydypsia or polyphagia,  *A full 10 point Review of Systems was done, except as stated above, all other Review of Systems were negative.  Social History History  Substance Use Topics  . Smoking status: Former Smoker -- 15 years    Types: Cigarettes    Quit date: 01/24/1983  . Smokeless tobacco: Never Used  . Alcohol Use: No    [facility]  Lives with: N/A  Ambulatory status: With assistive devices and the assistance of at least 1 person at times   Family History Family History  Problem Relation Age of Onset  . Colon cancer Mother      Prior to Admission medications   Medication Sig Start Date End Date Taking? Authorizing Provider  calcium carbonate (TUMS - DOSED IN MG ELEMENTAL CALCIUM) 500 MG chewable tablet Chew 1 tablet by mouth 3 (three) times daily as needed for indigestion or heartburn.   Yes Historical Provider, MD  dextrose (GLUTOSE) 40 % GEL Take 37.5 g by mouth as needed for low blood sugar (CBG less than 70 or CBG greater than 70 and next meal is more than 1 hours away). 07/22/13  Yes Janece Canterbury, MD  diltiazem (TIAZAC) 300 MG 24 hr capsule Take 300 mg by mouth daily.    Yes Historical Provider, MD  diphenhydramine-acetaminophen (TYLENOL PM EXTRA STRENGTH) 25-500 MG TABS Take 2 tablets by mouth at bedtime.    Yes Historical Provider, MD  geriatric multivitamins-minerals (ELDERTONIC/GEVRABON) ELIX Take 15 mLs by mouth 2 (two) times daily. 15 ml before breakfast   Yes Historical Provider, MD  glipiZIDE (GLUCOTROL) 5 MG tablet Take 5 mg by mouth daily before breakfast.    Yes Historical Provider, MD  guaiFENesin (MUCINEX) 600 MG 12 hr tablet Take 600 mg by mouth 2 (two) times daily as needed for cough.   Yes Historical Provider, MD  hydrALAZINE (APRESOLINE) 100 MG tablet Take 50 mg by mouth 2 (two)  times daily.    Yes Historical Provider, MD  insulin aspart (NOVOLOG) 100 UNIT/ML injection Inject 5-10 Units into the skin 3 (three) times daily before meals. cbg 200-300 5 units; 301-400 8 units; 401-500 10 units Patient taking differently: Inject 5-10 Units into the skin 3 (three) times daily before meals. cbg <80 juice or glutose 15, 150-199=5 units, 200-299=8 units, 300or > 12units 07/22/13  Yes Noah Delaine  Short, MD  loratadine (CLARITIN) 10 MG tablet Take 10 mg by mouth daily as needed. allergies   Yes Historical Provider, MD  lubiprostone (AMITIZA) 24 MCG capsule Take 24 mcg by mouth 2 (two) times daily with a meal.   Yes Historical Provider, MD  mycophenolate (MYFORTIC) 180 MG EC tablet Take 180 mg by mouth 2 (two) times daily.   Yes Historical Provider, MD  oxyCODONE-acetaminophen (PERCOCET) 5-325 MG per tablet Take 1 tablet by mouth every 8 (eight) hours as needed for moderate pain. 07/15/14  Yes Pamella Pert, MD  polyethylene glycol (MIRALAX / GLYCOLAX) packet Take 17 g by mouth daily. M/W/F for constipation   Yes Historical Provider, MD  ranitidine (ZANTAC) 300 MG capsule Take 300 mg by mouth daily.   Yes Historical Provider, MD  sodium bicarbonate 650 MG tablet Take 650 mg by mouth 2 (two) times daily.    Yes Historical Provider, MD  tacrolimus (PROGRAF) 1 MG capsule Take 1 mg by mouth 2 (two) times daily.    Yes Historical Provider, MD  aspirin 81 MG tablet Take 81 mg by mouth daily.    Historical Provider, MD  megestrol (MEGACE) 400 MG/10ML suspension Take 400 mg by mouth daily as needed. For appetite    Historical Provider, MD    No Known Allergies  Physical Exam  Vitals  Blood pressure 114/39, pulse 80, temperature 99.9 F (37.7 C), temperature source Rectal, resp. rate 22, height 5' 11.5" (1.816 m), weight 180 lb (81.647 kg), SpO2 99 %.   General:  In no acute distress, appears chronically ill and markedly debilitated  Psych:  Normal affect,  Awake Alert, Oriented X 3.  Speech and thought patterns are clear and appropriate  Neuro: CN II through XII intact, Strength 5/5 all 4 extremities, Sensation intact all 4 extremities. Occasional resting tremor left arm noted  ENT:  Ears and Eyes appear Normal, Conjunctivae clear, PER. Dry oral mucosa without erythema or exudates.  Neck:  Supple, No lymphadenopathy appreciated  Respiratory:  Symmetrical chest wall movement, fair air movement bilaterally, bilateral crackles prominently in mid fields to bases. 2 L  Cardiac:  RRR, tight sounding grade 5/6 systolic murmur best auscultated right sternal border second intercostal space, no LE edema noted, no JVD, No carotid bruits, peripheral pulses palpable at 2+  Abdomen:  Positive bowel sounds, Soft, Non tender, Non distended,  No masses appreciated, no obvious hepatosplenomegaly  Skin:  No Cyanosis, Normal Skin Turgor, No Skin Rash or Bruise. Did not turn patient to inspect sacral decubitus  Extremities: Symmetrical without obvious trauma or injury,  no effusions.  Data Review  CBC  Recent Labs Lab 12/30/14 0404  WBC 9.4  HGB 8.7*  HCT 25.9*  PLT 219  MCV 79.7  MCH 26.8  MCHC 33.6  RDW 14.2    Chemistries   Recent Labs Lab 12/30/14 0404  NA 138  K 4.3  CL 108  CO2 22  GLUCOSE 189*  BUN 33*  CREATININE 1.48*  CALCIUM 8.6*    estimated creatinine clearance is 43.1 mL/min (by C-G formula based on Cr of 1.48).  No results for input(s): TSH, T4TOTAL, T3FREE, THYROIDAB in the last 72 hours.  Invalid input(s): FREET3  Coagulation profile No results for input(s): INR, PROTIME in the last 168 hours.   Recent Labs  12/30/14 0404  DDIMER 1.15*    Cardiac Enzymes  Recent Labs Lab 12/30/14 0404  TROPONINI 0.27*    Invalid input(s): POCBNP  Urinalysis    Component  Value Date/Time   COLORURINE YELLOW 10/30/2014 0040   APPEARANCEUR CLEAR 10/30/2014 0040   LABSPEC 1.015 10/30/2014 0040   PHURINE 7.5 10/30/2014 0040   GLUCOSEU  NEGATIVE 10/30/2014 0040   HGBUR NEGATIVE 10/30/2014 0040   BILIRUBINUR NEGATIVE 10/30/2014 0040   KETONESUR NEGATIVE 10/30/2014 0040   PROTEINUR 30* 10/30/2014 0040   UROBILINOGEN 0.2 10/30/2014 0040   NITRITE NEGATIVE 10/30/2014 0040   LEUKOCYTESUR NEGATIVE 10/30/2014 0040    Imaging results:   Dg Chest Port 1 View  12/30/2014   CLINICAL DATA:  Shortness of breath  EXAM: PORTABLE CHEST - 1 VIEW  COMPARISON:  10/29/2014  FINDINGS: There is bilateral fairly symmetric airspace disease, not seen previously. Possible small pleural effusions. No cardiomegaly. Stable mediastinal contours.  Limited evaluation the apices due to patient positioning.  No acute osseous findings.  IMPRESSION: Bilateral airspace disease, favor edema over multi focal infection.   Electronically Signed   By: Monte Fantasia M.D.   On: 12/30/2014 03:55     EKG: (Independently reviewed) sinus rhythm ventricular rate 93, QTC 456 ms, chronic prominent T waves in V2 and V3 as well as downsloping of ST segment in V5 and V6. No acute ischemic changes   Assessment & Plan  Active Problems:   Acute respiratory failure with hypoxia:   A) Aortic stenosis, severe   B) Acute on chronic diastolic CHF (congestive heart failure), NYHA class 2 -Admit to telemetry -Appreciate cardiology assistance -Suspect severe aortic stenosis contributing to cardiac remodeling and associated CHF symptoms -Discussed with cardiologist on-call who suspects patient likely not candidate for TAVR procedure given history of renal transplant and underlying stage III chronic kidney disease since procedure would require cardiac catheterization and intravenous contrast-patient's primary outpatient cardiologist Dr. Tamala Julian is primary OP cardiologist -Currently stable on oxygen so we'll give Lasix 60 mg IV 1 after blood transfusion and defer additional diuretic dosing to the cardiology team -Continue preadmission and presently but decreased dose from 50 mg to 25  mg twice a day since most recent systolic blood pressure was less than 120 -Check 2-D echocardiogram; this may be beneficial in adjusting medical therapy and to see if has had significant cardiac remodeling and changes in ejection fraction or progression otherwise in heart failure which may lead to discussions regarding goals of care- patient has had progressive decline in function noting at recent visit to neurologist 11/09/14 recommendation was given for palliative care for pain management, symptom control and advanced care planning.    Symptomatic anemia -Transfuse 1 unit packed red cells only at this point -Fecal occult positive so consult GI although given multiple medical problems aggressive workup likely limited (and suspect not indicated) since no gross active bleeding seen  (see below)-underwent endoscopy with Dr. Watt Climes in 2013 -ck anemia panel and TSH     Type 2 diabetes mellitus -Hold control acutely -Provide sliding scale insulin -Check hemoglobin A1c    H/O atrial fibrillation -Currently maintaining sinus rhythm -Continue Cardizem but may need to decrease dosage while diuresing to minimize hypotension    CKD (chronic kidney disease), stage III/History of renal transplant -Renal function stable and at baseline -Continue tacrolimus and Myfortic -Tacrolimus level normal    Positive D dimer -Likely more a marker of renal dysfunction which is chronic -Since did have abrupt onset of symptoms we'll check VQ scan; underlying renal disease unable to check CT of the chest with contrast   Elevated troponin -More reflective of renal dysfunction and heart failure with a likely component of  demand ischemia -Patient not having chest pain or other classic ischemic symptoms    History of CVA/ FTT -Recent outpatient visit with neurologist -Differential at that time included the possibility of superimposed parkinsonism, Parkinson's disease versus dementia with Lewy bodies this in regards to  patient's progressive symptoms of general physical and cognitive decline with weakness tremor loss of appetite shortness of breath fatigue and increasing falls patient was also reporting hallucinations and confusion at night and therefore was felt not to be a good candidate for additional medical therapy. -As noted above palliative care was recommended at that time. -Speech therapy evaluation for dietary appropriateness with consistency due to suspected underlying dysphagia      Decubitus ulcer of sacral region, stage 2 -Consult WOC RN -Air mattress to bed an air cushion pad to chair    DVT Prophylaxis: Subcutaneous heparin  Family Communication:   No family at bedside  Code Status:  Full code-suspect may benefit from palliative medicine evaluation for goals of care  Condition:  Stable  Discharge disposition: Anticipate return to skilled nursing facility when above symptoms improved/resolved and workup completed  Time spent in minutes : 60      Germani [name] L. ANP on 12/30/2014 at 8:09 AM  Between 7am to 7pm - Pager - [phone]  After 7pm go to [email or web address] - password [email or web address]  And look for the night coverage person covering me after hours  [facility]

## 2014-12-30 NOTE — Progress Notes (Signed)
Echocardiogram 2D Echocardiogram has been performed.  Joelene Millin 12/30/2014, 12:08 PM

## 2014-12-30 NOTE — ED Provider Notes (Signed)
CSN: [id number]     Arrival date & time 12/30/14  0308 History  This chart was scribed for Merryl Hacker, MD by Delphia Grates, ED Scribe. This patient was seen in room D35C/D35C and the patient's care was started at 3:36 AM.   Chief Complaint  Patient presents with  . Shortness of Breath    The history is provided by the patient. No language interpreter was used.     HPI Comments: Evan [name] is a 81 y.o. male, with history of DM, stroke, and pneumonia, brought in by ambulance, who presents to the Emergency Department complaining of sudden onset, moderate SOB that began this morning. Patient is from [facility] and felt short of breath. He has been trying to cough but feels that that has been difficult. He states that he spent several hours trying to cough up sputum. No known fevers at home. He is not on home O2 at this time. Patient has been eating and drinking normally. He reports right-sided hemiparesis due to a stroke. He denies fever, chills, or pain at this time. Denies chest pain.    Past Medical History  Diagnosis Date  . H/O kidney transplant     End stage renal disease  . Diabetes mellitus   . Cancer     hx prostate cancer  . ESRD (end stage renal disease) 2011    hemodialysis started Sept 2011, cadaveric (double) renal  transplant Mar 2012 at [facility]  . Stroke   . GERD (gastroesophageal reflux disease)   . Hypertension   . Dysrhythmia     hx of atrial fibrilation  . Shortness of breath     " sometimes "  . Pneumonia 04/08/2012  . Heart murmur   . Arthritis     hands  . UTI (urinary tract infection)   . Osteoarthritis   . Iron deficiency    Past Surgical History  Procedure Laterality Date  . Kidney transplant      had both kidneys replaced at once   . Prostate surgery  approx. 2003    seed implant   Family History  Problem Relation Age of Onset  . Colon cancer Mother    History  Substance Use Topics  . Smoking status: Former Smoker -- 15 years     Types: Cigarettes    Quit date: 01/24/1983  . Smokeless tobacco: Never Used  . Alcohol Use: No    Review of Systems  Constitutional: Negative.  Negative for fever and chills.  Respiratory: Positive for cough and shortness of breath. Negative for chest tightness.   Cardiovascular: Negative.  Negative for chest pain and leg swelling.  Gastrointestinal: Negative.  Negative for nausea, vomiting, abdominal pain and blood in stool.  Genitourinary: Negative.  Negative for dysuria.  Musculoskeletal: Negative for back pain.  Skin: Negative for rash.  Neurological: Negative for headaches.  All other systems reviewed and are negative.     Allergies  Review of patient's allergies indicates no known allergies.  Home Medications   Prior to Admission medications   Medication Sig Start Date End Date Taking? Authorizing Provider  calcium carbonate (TUMS - DOSED IN MG ELEMENTAL CALCIUM) 500 MG chewable tablet Chew 1 tablet by mouth 3 (three) times daily as needed for indigestion or heartburn.   Yes Historical Provider, MD  dextrose (GLUTOSE) 40 % GEL Take 37.5 g by mouth as needed for low blood sugar (CBG less than 70 or CBG greater than 70 and next meal is more  than 1 hours away). 07/22/13  Yes Janece Canterbury, MD  diltiazem (TIAZAC) 300 MG 24 hr capsule Take 300 mg by mouth daily.    Yes Historical Provider, MD  diphenhydramine-acetaminophen (TYLENOL PM EXTRA STRENGTH) 25-500 MG TABS Take 2 tablets by mouth at bedtime.    Yes Historical Provider, MD  geriatric multivitamins-minerals (ELDERTONIC/GEVRABON) ELIX Take 15 mLs by mouth 2 (two) times daily. 15 ml before breakfast   Yes Historical Provider, MD  glipiZIDE (GLUCOTROL) 5 MG tablet Take 5 mg by mouth daily before breakfast.    Yes Historical Provider, MD  guaiFENesin (MUCINEX) 600 MG 12 hr tablet Take 600 mg by mouth 2 (two) times daily as needed for cough.   Yes Historical Provider, MD  hydrALAZINE (APRESOLINE) 100 MG tablet Take 50 mg by  mouth 2 (two) times daily.    Yes Historical Provider, MD  insulin aspart (NOVOLOG) 100 UNIT/ML injection Inject 5-10 Units into the skin 3 (three) times daily before meals. cbg 200-300 5 units; 301-400 8 units; 401-500 10 units Patient taking differently: Inject 5-10 Units into the skin 3 (three) times daily before meals. cbg <80 juice or glutose 15, 150-199=5 units, 200-299=8 units, 300or > 12units 07/22/13  Yes Janece Canterbury, MD  loratadine (CLARITIN) 10 MG tablet Take 10 mg by mouth daily as needed. allergies   Yes Historical Provider, MD  lubiprostone (AMITIZA) 24 MCG capsule Take 24 mcg by mouth 2 (two) times daily with a meal.   Yes Historical Provider, MD  mycophenolate (MYFORTIC) 180 MG EC tablet Take 180 mg by mouth 2 (two) times daily.   Yes Historical Provider, MD  oxyCODONE-acetaminophen (PERCOCET) 5-325 MG per tablet Take 1 tablet by mouth every 8 (eight) hours as needed for moderate pain. 07/15/14  Yes Pamella Pert, MD  polyethylene glycol (MIRALAX / GLYCOLAX) packet Take 17 g by mouth daily. M/W/F for constipation   Yes Historical Provider, MD  ranitidine (ZANTAC) 300 MG capsule Take 300 mg by mouth daily.   Yes Historical Provider, MD  sodium bicarbonate 650 MG tablet Take 650 mg by mouth 2 (two) times daily.    Yes Historical Provider, MD  tacrolimus (PROGRAF) 1 MG capsule Take 1 mg by mouth 2 (two) times daily.    Yes Historical Provider, MD  aspirin 81 MG tablet Take 81 mg by mouth daily.    Historical Provider, MD  megestrol (MEGACE) 400 MG/10ML suspension Take 400 mg by mouth daily as needed. For appetite    Historical Provider, MD   Triage Vitals: BP 127/44 mmHg  Pulse 93  Temp(Src) 98.8 F (37.1 C) (Oral)  Resp 30  Ht 5' 11.5" (1.816 m)  Wt 180 lb (81.647 kg)  BMI 24.76 kg/m2  SpO2 93%  Physical Exam  Constitutional: He is oriented to person, place, and time. No distress.  Chronically ill-appearing, kyphotic  HENT:  Head: Normocephalic and atraumatic.   Mucous membranes dry  Eyes: Pupils are equal, round, and reactive to light.  Cardiovascular: Normal rate and regular rhythm.   Murmur heard. Harsh systolic ejection murmur  Pulmonary/Chest: Effort normal and breath sounds normal. No respiratory distress. He has no wheezes.  Distant breath sounds, no appreciable abnormalities  Abdominal: Soft. Bowel sounds are normal. There is no tenderness. There is no rebound.  Genitourinary: Rectum normal.  Brown stool  Musculoskeletal: He exhibits no edema.  No calf tenderness  Neurological: He is alert and oriented to person, place, and time.  Facial droop and right-sided weakness noted, patient is dysarthric  Skin: Skin is warm and dry.  Skin breakdown over the sacrum, likely stage II  Psychiatric: He has a normal mood and affect.  Nursing note and vitals reviewed.   ED Course  Procedures (including critical care time)  DIAGNOSTIC STUDIES: Oxygen Saturation is 93% on Dustin (2L/min), adequate by my interpretation.    COORDINATION OF CARE: At 32 Discussed treatment plan with patient. Patient agrees.   Labs Review Labs Reviewed  CBC - Abnormal; Notable for the following:    RBC 3.25 (*)    Hemoglobin 8.7 (*)    HCT 25.9 (*)    All other components within normal limits  BASIC METABOLIC PANEL - Abnormal; Notable for the following:    Glucose, Bld 189 (*)    BUN 33 (*)    Creatinine, Ser 1.48 (*)    Calcium 8.6 (*)    GFR calc non Af Amer 43 (*)    GFR calc Af Amer 50 (*)    All other components within normal limits  TROPONIN I - Abnormal; Notable for the following:    Troponin I 0.27 (*)    All other components within normal limits  I-STAT TROPOININ, ED - Abnormal; Notable for the following:    Troponin i, poc 0.35 (*)    All other components within normal limits  I-STAT TROPOININ, ED - Abnormal; Notable for the following:    Troponin i, poc 0.30 (*)    All other components within normal limits  POC OCCULT BLOOD, ED - Abnormal;  Notable for the following:    Fecal Occult Bld POSITIVE (*)    All other components within normal limits  BRAIN NATRIURETIC PEPTIDE  OCCULT BLOOD X 1 CARD TO LAB, STOOL  D-DIMER, QUANTITATIVE (NOT AT [facility])  TYPE AND SCREEN    Imaging Review Dg Chest Port 1 View  12/30/2014   CLINICAL DATA:  Shortness of breath  EXAM: PORTABLE CHEST - 1 VIEW  COMPARISON:  10/29/2014  FINDINGS: There is bilateral fairly symmetric airspace disease, not seen previously. Possible small pleural effusions. No cardiomegaly. Stable mediastinal contours.  Limited evaluation the apices due to patient positioning.  No acute osseous findings.  IMPRESSION: Bilateral airspace disease, favor edema over multi focal infection.   Electronically Signed   By: Monte Fantasia M.D.   On: 12/30/2014 03:55     EKG Interpretation   Date/Time:  Thursday Dec 30 2014 03:18:20 EDT Ventricular Rate:  93 PR Interval:  66 QRS Duration: 102 QT Interval:  367 QTC Calculation: 456 R Axis:   22 Text Interpretation:  Sinus rhythm Short PR interval Repol abnrm suggests  ischemia, lateral leads Similar to prior Confirmed by Wilver Tignor  MD, [place]  ([id number]) on 12/30/2014 4:41:36 AM      MDM   Final diagnoses:  Elevated troponin  Shortness of breath  Anemia, unspecified anemia type    Patient presents with shortness of breath. Initially started with a coughing fit. Denies fevers. Afebrile here. Vital signs notable for mild tachypnea. Patient is on 2 L of oxygen. Initial room air sats 93%. Does not appear volume overloaded. No history of CHF.  EKG is unchanged.  Chest x-ray shows bilateral airspace disease favorable for edema over infection.  No evidence of peripheral volume overload. Of note, troponin elevated to 0.3. Patient is currently chest pain-free. Also notable is a new hematocrit of 25.9, down from 32. Patient denies blood in stool. He is Hemoccult-positive.  No gross blood on exam.    Patient has multiple  things going on.  Troponin is elevated but he is chest pain-free and EKG is unchanged. He also appears to have some pulmonary edema on his chest x-ray. Not volume overloaded peripherally. BNP is pending. Discussed with cardiology, they will consult. Given anemia, no intervention at this time. Patient was given a full dose aspirin.  Shortness of breath may be related to pulmonary edema. Patient is also a risk for aspiration but at this time does not appear infected. No calf tenderness or evidence of blood clot but this is certainly a consideration. Given patient's renal function, he is not a candidate for CTA. At the request of hospitalist, d-dimer was sent.  Patient will be admitted to the hospitalist service. Urology consulting.  I personally performed the services described in this documentation, which was scribed in my presence. The recorded information has been reviewed and is accurate.   Merryl Hacker, MD 12/30/14 [phone]

## 2014-12-30 NOTE — ED Notes (Signed)
MD at bedside. 

## 2014-12-31 DIAGNOSIS — R0602 Shortness of breath: Secondary | ICD-10-CM

## 2014-12-31 DIAGNOSIS — I209 Angina pectoris, unspecified: Secondary | ICD-10-CM

## 2014-12-31 DIAGNOSIS — N183 Chronic kidney disease, stage 3 (moderate): Secondary | ICD-10-CM

## 2014-12-31 DIAGNOSIS — I5033 Acute on chronic diastolic (congestive) heart failure: Principal | ICD-10-CM

## 2014-12-31 DIAGNOSIS — L89152 Pressure ulcer of sacral region, stage 2: Secondary | ICD-10-CM

## 2014-12-31 DIAGNOSIS — Z515 Encounter for palliative care: Secondary | ICD-10-CM | POA: Insufficient documentation

## 2014-12-31 DIAGNOSIS — I5031 Acute diastolic (congestive) heart failure: Secondary | ICD-10-CM

## 2014-12-31 LAB — GLUCOSE, CAPILLARY
GLUCOSE-CAPILLARY: 154 mg/dL — AB (ref 65–99)
Glucose-Capillary: 128 mg/dL — ABNORMAL HIGH (ref 65–99)
Glucose-Capillary: 129 mg/dL — ABNORMAL HIGH (ref 65–99)
Glucose-Capillary: 126 mg/dL — ABNORMAL HIGH (ref 65–99)

## 2014-12-31 LAB — CBC
HEMATOCRIT: 26.8 % — AB (ref 39.0–52.0)
Hemoglobin: 9.1 g/dL — ABNORMAL LOW (ref 13.0–17.0)
MCH: 26.9 pg (ref 26.0–34.0)
MCHC: 34 g/dL (ref 30.0–36.0)
MCV: 79.3 fL (ref 78.0–100.0)
Platelets: 216 10*3/uL (ref 150–400)
RBC: 3.38 MIL/uL — ABNORMAL LOW (ref 4.22–5.81)
RDW: 14.2 % (ref 11.5–15.5)
WBC: 10.6 10*3/uL — AB (ref 4.0–10.5)

## 2014-12-31 LAB — TYPE AND SCREEN
ABO/RH(D): O NEG
Antibody Screen: NEGATIVE
Unit division: 0

## 2014-12-31 LAB — BASIC METABOLIC PANEL
ANION GAP: 12 (ref 5–15)
BUN: 40 mg/dL — ABNORMAL HIGH (ref 6–20)
CO2: 21 mmol/L — ABNORMAL LOW (ref 22–32)
CREATININE: 1.75 mg/dL — AB (ref 0.61–1.24)
Calcium: 8.7 mg/dL — ABNORMAL LOW (ref 8.9–10.3)
Chloride: 105 mmol/L (ref 101–111)
GFR calc non Af Amer: 35 mL/min — ABNORMAL LOW (ref >60)
GFR, EST AFRICAN AMERICAN: 41 mL/min — AB (ref >60)
GLUCOSE: 134 mg/dL — AB (ref 65–99)
Potassium: 4.2 mmol/L (ref 3.5–5.1)
SODIUM: 138 mmol/L (ref 135–145)

## 2014-12-31 LAB — HEMOGLOBIN A1C
Hgb A1c MFr Bld: 6.1 % — ABNORMAL HIGH (ref 4.8–5.6)
Mean Plasma Glucose: 128 mg/dL

## 2014-12-31 MED ORDER — SENNOSIDES-DOCUSATE SODIUM 8.6-50 MG PO TABS
1.0000 | ORAL_TABLET | Freq: Every day | ORAL | Status: DC
  Administered 2014-12-31 – 2015-01-02 (×3): 1 via ORAL
  Filled 2014-12-31 (×3): qty 1

## 2014-12-31 MED ORDER — OXYCODONE-ACETAMINOPHEN 5-325 MG PO TABS
1.0000 | ORAL_TABLET | ORAL | Status: DC | PRN
  Administered 2015-01-01: 1 via ORAL
  Filled 2014-12-31: qty 1

## 2014-12-31 MED ORDER — MORPHINE SULFATE 2 MG/ML IJ SOLN
2.0000 mg | INTRAMUSCULAR | Status: DC | PRN
  Administered 2015-01-02 – 2015-01-03 (×3): 2 mg via INTRAVENOUS
  Filled 2014-12-31 (×3): qty 1

## 2014-12-31 MED ORDER — GI COCKTAIL ~~LOC~~
30.0000 mL | Freq: Once | ORAL | Status: AC
  Administered 2014-12-31: 30 mL via ORAL
  Filled 2014-12-31: qty 30

## 2014-12-31 MED ORDER — MORPHINE SULFATE 2 MG/ML IJ SOLN
1.0000 mg | Freq: Once | INTRAMUSCULAR | Status: AC
  Administered 2014-12-31: 1 mg via INTRAVENOUS
  Filled 2014-12-31: qty 1

## 2014-12-31 MED ORDER — MORPHINE SULFATE 2 MG/ML IJ SOLN: 2.0000 mg | INTRAMUSCULAR | Status: DC | PRN

## 2014-12-31 MED ORDER — POLYETHYLENE GLYCOL 3350 17 G PO PACK
17.0000 g | PACK | Freq: Every day | ORAL | Status: DC
  Administered 2014-12-31 – 2015-01-03 (×3): 17 g via ORAL
  Filled 2014-12-31 (×4): qty 1

## 2014-12-31 NOTE — Progress Notes (Signed)
[facility]  Patient Status: Active (receiving services up to time of hospitalization)  [facility] is providing the following services: RN and PT  If patient discharges after hours, please call [phone].   Consepcion Hearing 12/31/2014, 11:59 AM

## 2014-12-31 NOTE — Evaluation (Signed)
Physical Therapy Evaluation Patient Details Name: Evan [name] MRN: [id number] DOB: 1932-03-06 Today's Date: 12/31/2014   History of Present Illness  This is a very pleasant and soft-spoken 81 year old male patient with history of diabetes, chronic kidney disease stage III status post renal transplant 2012, paroxysmal atrial fibrillation on rate control agents only, remote CVA with residual right-sided hemiparesis, hypertension, grade 2 diastolic dysfunction as well as severe aortic stenosis (based on echocardiogram in 2013), and sacral decubitus. He was sent over from the skilled nursing facility due to acute onset of shortness of breath.   Clinical Impression  I am very familiar with this pt - i have been his therapist for last 2 months with [facility].  Pt was walking 30 feet with RW and chair following.  Pt mod assist for most mobilty and making small steady improvements.  Pt had no chest pain. Pt did have significant dirrhea earlier this week which was unusual for him.   Today pt stood EOB and was dizzy with 10/10 chest pain.  Cardiology notified and tests ordered.  Pt unable to tolerate therapy today    Follow Up Recommendations SNF;Supervision/Assistance - 24 hour (Pt wanting to go home but SNF would offer more assist and more therapy)    Equipment Recommendations  None recommended by PT    Recommendations for Other Services       Precautions / Restrictions Precautions Precautions: Fall Restrictions Other Position/Activity Restrictions: Pt has severe forward head and kyphosis which causes difficulty iwth positioning.  Pt has all equipment at  home (hospital bed etc) but chooses to sleep in his lift chair with significant pillows to position in forward head.    this has caused some skin breakdown - but it has recently resolved with [facility]      Mobility  Bed Mobility Overal bed mobility: +2 for physical assistance             General bed mobility comments: pt is not used to laying  in bed - needed max assist to get out of bed.  pt with sacral sitting on EOB - he always has back support when sitting at home  Transfers Overall transfer level: Needs assistance Equipment used: Rolling walker (2 wheeled) Transfers: Sit to/from Stand Sit to Stand: Mod assist            Ambulation/Gait Ambulation/Gait assistance: Mod assist Ambulation Distance (Feet):  (3 steps in place) Assistive device: Rolling walker (2 wheeled)       General Gait Details: Pt tends to walk with severly flexed posture and a lot of weight on UEs.  pt takes short shuffling steps  Stairs            Wheelchair Mobility    Modified Rankin (Stroke Patients Only)       Balance Overall balance assessment: Needs assistance                                           Pertinent Vitals/Pain Pain Assessment: 0-10 Pain Score: 10-Worst pain ever Pain Location:  (chest pain - decreased to 6/10.  Pt has chronic neck and upper back pain)    Home Living Family/patient expects to be discharged to:: Private residence Living Arrangements: Spouse/significant other Available Help at Discharge: Family;Personal care attendant;Available PRN/intermittently Type of Home: House Home Access: Ramped entrance     Home Layout: One level Home Equipment:  Walker - 2 wheels;Bedside commode;Shower seat (lift chair) Additional Comments: Pt lives in his home with wife and private pay caregivers.  Pt was in [facility] [facility] for last week and half while wife recovering from hip replacement and then pt was to return home.  pt has granddaughter that is local and involved in pt care  pt hires private sitters that help him with all positioning and toileting - he must wait for them to come for any toileting.  pt is heavy mod assist transfer from wheelchair and some of his caregivers have given their notice as pt hard to handle and hard on their backs.  Pt spends all his ime in lift chair (this is the only way he  can get his neck comfortable) and ipresssure issues have been a problem - but pt fully healed as of last week.    Prior Function Level of Independence: Needs assistance   Gait / Transfers Assistance Needed: As of last week - pt able to walk with RW for 30 feet distances - with wheelchiar following him.  min assist for transfers from lift cahir and mod assist from wheelchair and BSC     Comments: P thas been at [facility] for week and half - private pay - while wife recovering from surgery.  Granddaughter [name] has been his contact person - organizing his care     Hand Dominance        Extremity/Trunk Assessment   Upper Extremity Assessment: Defer to OT evaluation           Lower Extremity Assessment:  (grossly hamstrings 2/5 and quads 3-/5.  tight heelcords and hamsttrings)      Cervical / Trunk Assessment: Kyphotic (severe forward head)  Communication   Communication:  (soft spoken but no difficulties)  Cognition Arousal/Alertness: Awake/alert Behavior During Therapy: WFL for tasks assessed/performed Overall Cognitive Status: Within Functional Limits for tasks assessed                      General Comments General comments (skin integrity, edema, etc.): Pt often lilkes to use soft cervical collar in standing for comfort - to help give his head/neck some support    Exercises        Assessment/Plan    PT Assessment Patient needs continued PT services  PT Diagnosis Difficulty walking;Generalized weakness;Acute pain   PT Problem List    PT Treatment Interventions Gait training;DME instruction;Therapeutic activities;Functional mobility training;Patient/family education   PT Goals (Current goals can be found in the Care Plan section) Acute Rehab PT Goals Patient Stated Goal: Pt wants to get back home with his wife PT Goal Formulation: With patient Time For Goal Achievement: 01/14/15 Potential to Achieve Goals: Fair    Frequency Min 3X/week   Barriers to  discharge Decreased caregiver support Pt has occasional paid caregivers - he needs 24/7 help    Co-evaluation               End of Session   Activity Tolerance: Patient limited by pain (Pt complained of 10/10 chest pain and dizziness) Patient left: in bed;with call bell/phone within reach Nurse Communication: Mobility status (cardiology PA came in and was told about chest pain - orders given)         Time: 1220-1310 PT Time Calculation (min) (ACUTE ONLY): 50 min   Charges:   PT Evaluation $Initial PT Evaluation Tier I: 1 Procedure PT Treatments $Therapeutic Activity: 23-37 mins   PT G Codes:  Loyal Buba 12/31/2014, 1:54 PM 12/31/2014   Rande Lawman, PT

## 2014-12-31 NOTE — Evaluation (Signed)
Clinical/Bedside Swallow Evaluation Patient Details  Name: Evan [name] MRN: [id number] Date of Birth: 28-Dec-1931  Today's Date: 12/31/2014 Time: SLP Start Time (ACUTE ONLY): 0959 SLP Stop Time (ACUTE ONLY): 1010 SLP Time Calculation (min) (ACUTE ONLY): 11 min  Past Medical History:  Past Medical History  Diagnosis Date  . H/O kidney transplant     End stage renal disease  . Diabetes mellitus   . Cancer     hx prostate cancer  . ESRD (end stage renal disease) 2011    hemodialysis started Sept 2011, cadaveric (double) renal  transplant Mar 2012 at [facility]  . Stroke   . GERD (gastroesophageal reflux disease)   . Hypertension   . Dysrhythmia     hx of atrial fibrilation  . Shortness of breath     " sometimes "  . Pneumonia 04/08/2012  . Heart murmur   . Arthritis     hands  . UTI (urinary tract infection)   . Osteoarthritis   . Iron deficiency    Past Surgical History:  Past Surgical History  Procedure Laterality Date  . Kidney transplant      had both kidneys replaced at once   . Prostate surgery  approx. 2003    seed implant  . Av fistula placement Left    HPI:  81 year-old with extensive PMH-ESRD, previously on hemodialysis, status post renal transplant on Immunosuppressants, type II DM, CVA with residual right hemiparesis, hypertension, atrial fibrillation, GERD, presents with complaints of shortness of breath, cough, secondary to volume overload/CHF, anemia(in the setting of Hemoccult-positive stools), and aortic stenosis. Pt has previously been seen by speech therapy for swallowing assessment, with most recent bedside evaluation in 2014 recommending regular diet and thin liquids.    Assessment / Plan / Recommendation Clinical Impression  Pt compensates for reduced mastication and A/P transit with Mod I by means of lingual sweep, slow rate, and liquid wash. No overt signs of aspiration observed. Will initiate Dys 3 diet (chopped meats per pt request) and thin  liquids. No further SLP f/u needed.    Aspiration Risk  Mild    Diet Recommendation Dysphagia 3 (Mech soft);Thin;Other (Comment) (chopped meats)   Medication Administration: Whole meds with puree Compensations: Slow rate;Small sips/bites    Other  Recommendations Oral Care Recommendations: Oral care BID   Pertinent Vitals/Pain n/a    SLP Swallow Goals     Swallow Study Prior Functional Status       General Other Pertinent Information: 81 year-old with extensive PMH-ESRD, previously on hemodialysis, status post renal transplant on Immunosuppressants, type II DM, CVA with residual right hemiparesis, hypertension, atrial fibrillation, GERD, presents with complaints of shortness of breath, cough, secondary to volume overload/CHF, anemia(in the setting of Hemoccult-positive stools), and aortic stenosis. Pt has previously been seen by speech therapy for swallowing assessment, with most recent bedside evaluation in 2014 recommending regular diet and thin liquids.  Type of Study: Bedside swallow evaluation Previous Swallow Assessment: see HPI Diet Prior to this Study: NPO Temperature Spikes Noted: No Respiratory Status: Room air History of Recent Intubation: No Behavior/Cognition: Alert;Cooperative;Requires cueing Self-Feeding Abilities: Able to feed self Patient Positioning: Upright in bed Baseline Vocal Quality: Low vocal intensity    Oral/Motor/Sensory Function Overall Oral Motor/Sensory Function: Appears within functional limits for tasks assessed   Ice Chips Ice chips: Not tested   Thin Liquid Thin Liquid: Within functional limits Presentation: Self Fed;Straw    Nectar Thick Nectar Thick Liquid: Not tested   Honey  Thick Honey Thick Liquid: Not tested   Puree Puree: Within functional limits Presentation: Self Fed;Spoon   Solid    Solid: Impaired Presentation: Self Fed Oral Phase Impairments: Impaired mastication;Impaired anterior to posterior transit      Germain Osgood, M.A. CCC-SLP [phone]  Germain Osgood 12/31/2014,10:43 AM

## 2014-12-31 NOTE — Consult Note (Signed)
Consultation Note Date: 12/31/2014   Patient Name: Evan [name]  DOB: May 19, 1932  MRN: [id number]  Age / Sex: 81 y.o., male   PCP: Foye Spurling, MD Referring Physician: Geradine Girt, DO  Reason for Consultation: Establishing goals of care  Palliative Care Assessment and Plan Summary of Established Goals of Care and Medical Treatment Preferences    Palliative Care Discussion Held Today Contacts/Participants in Discussion: Primary Decision Maker: Patient  HCPOA: not documented  Met with Evan [name] today.  He is somewhat difficult to understand (likely 2/2 his previous CVA history).  He was able to tell me he heard about problems with his valve and even expressed that he heard he was "in a tough spot".  He came in from SNF, and his wife is currently at [facility] after falling and having hip fx.  He tells me that family member he is closest with his his grand-daughter Evan [name] and asked that I call her.  I think it likely best to have conversation with family present especially given noted dementia concerns (parkinsons, lewy-body, vascular).  It sounds as if options extremely limited and appreciate Cardiology evaluation. Unfortunately he appears to be in a difficult spot with limited available interventions. Evan [name] will be in tomorrow at noon and I will plan on discussing case further with them tomorrow and attempt establishing goals of care. It appears even non-invasive options limited as he had rise in creatinine with single lasix dose.    Code Status/Advance Care Planning:  Full  Symptom Management:   Dysnnea/Chest Pain- almost certainly from severe AS and ? Of underlying CAD.  Percocet q8h is likely too infrequent.  Will increase percocet frequency and allow IV morphine as backup for pain control.    Palliative Prophylaxis:  Will add doc/senna with addition of opiods  Psycho-social/Spiritual:   Support System: his daughter is deceased. Wife at [facility]. Granddaughter Evan [name] is his  closest family as both she and he confirm.     Prognosis: < 6 months  Discharge Planning:  to be determined       Chief Complaint: Dyspnea  History of Present Illness:  81 yo male with PMHx of DM, CKD III s/p renal transplant, Adib, remote CVA with R hemiparesis, severe aortic stenosis, dementia, sacral decubs who was admitted yesterday from SNF with complaint of cough and acute onset of dyspnea over 24hr period.  He has chronic neck/back pain and is minimally ambulatory at baseline with his h/o CVA and also dementia.  On arrival to ED, he was noted to have tachypnea and hypoxia as well as anemia with guaiac positive stool.  CXR c/w edema. He received one dose of lasix as well as blood transfusion. Unfortunately, his creatinine rose even with 1 dose of lasix.  History of severe AS and repeat echo here demonstrated critical aortic stenosis.  His presentation is certainly consistent with symptomatic AS. He has also had some chest pains and elevated troponin concnerning for demand ischemia.  He did have V/Q scan which was low probability for PE.  Cardiology has evaluated and deemed to be poor candidate for TAVR or other invasive procedures given his renal transplant and CKD.  Currently he has heard some of this information as noted above. His biggest complaints currently are ongoing dyspnea and pain in his chest.  Hard for him to describe pain but describes severe substernal chest pain/pressure.  This was reportedly exacerbated when attempting to work with PT.  Swallow eval also performed today and dysphagia  diet recommended. GI eval but no further workup planned as certainly seems it would be hard for him to tolerate any GI procedures.     Primary Diagnoses  Present on Admission:  . (Resolved) Shortness of breath . FTT (failure to thrive) in adult . Acute on chronic diastolic CHF (congestive heart failure), NYHA class 2 . CKD (chronic kidney disease), stage III . Symptomatic anemia . Acute  respiratory failure with hypoxia . Decubitus ulcer of sacral region, stage 2 . Positive D dimer . Acute diastolic heart failure, NYHA class 2  I have reviewed the medical record, interviewed the patient and family, and examined the patient. The following aspects are pertinent.  Past Medical History  Diagnosis Date  . H/O kidney transplant     End stage renal disease  . Diabetes mellitus   . Cancer     hx prostate cancer  . ESRD (end stage renal disease) 2011    hemodialysis started Sept 2011, cadaveric (double) renal  transplant Mar 2012 at [facility]  . Stroke   . GERD (gastroesophageal reflux disease)   . Hypertension   . Dysrhythmia     hx of atrial fibrilation  . Shortness of breath     " sometimes "  . Pneumonia 04/08/2012  . Heart murmur   . Arthritis     hands  . UTI (urinary tract infection)   . Osteoarthritis   . Iron deficiency    History   Social History  . Marital Status: Married    Spouse Name: Evan [name]  . Number of Children: 1  . Years of Education: BA, MS   Occupational History  .     Social History Main Topics  . Smoking status: Former Smoker -- 15 years    Types: Cigarettes    Quit date: 01/24/1983  . Smokeless tobacco: Never Used  . Alcohol Use: No  . Drug Use: No  . Sexual Activity: Not Currently   Other Topics Concern  . None   Social History Narrative   Pt lives at home with his spouse.   Caffeine Use: Drinks coffee occasionally.    Family History  Problem Relation Age of Onset  . Colon cancer Mother    Scheduled Meds: . sodium chloride   Intravenous Once  . aspirin  81 mg Oral Daily  . diltiazem  300 mg Oral Daily  . famotidine  10 mg Oral Daily  . heparin  5,000 Units Subcutaneous 3 times per day  . hydrALAZINE  25 mg Oral BID  . insulin aspart  0-5 Units Subcutaneous QHS  . insulin aspart  0-9 Units Subcutaneous TID WC  . megestrol  400 mg Oral Daily  .  morphine injection  1 mg Intravenous Once  . mycophenolate  180 mg Oral BID    . polyethylene glycol  17 g Oral Daily  . sodium bicarbonate  650 mg Oral BID  . sodium chloride  3 mL Intravenous Q12H  . tacrolimus  1 mg Oral BID   Continuous Infusions:  PRN Meds:.sodium chloride, acetaminophen, loratadine, morphine injection, ondansetron (ZOFRAN) IV, oxyCODONE-acetaminophen, sodium chloride Medications Prior to Admission:  Prior to Admission medications   Medication Sig Start Date End Date Taking? Authorizing Provider  calcium carbonate (TUMS - DOSED IN MG ELEMENTAL CALCIUM) 500 MG chewable tablet Chew 1 tablet by mouth 3 (three) times daily as needed for indigestion or heartburn.   Yes Historical Provider, MD  dextrose (GLUTOSE) 40 % GEL Take 37.5 g by mouth as  needed for low blood sugar (CBG less than 70 or CBG greater than 70 and next meal is more than 1 hours away). 07/22/13  Yes Janece Canterbury, MD  diltiazem (TIAZAC) 300 MG 24 hr capsule Take 300 mg by mouth daily.    Yes Historical Provider, MD  diphenhydramine-acetaminophen (TYLENOL PM EXTRA STRENGTH) 25-500 MG TABS Take 2 tablets by mouth at bedtime.    Yes Historical Provider, MD  geriatric multivitamins-minerals (ELDERTONIC/GEVRABON) ELIX Take 15 mLs by mouth 2 (two) times daily. 15 ml before breakfast   Yes Historical Provider, MD  glipiZIDE (GLUCOTROL) 5 MG tablet Take 5 mg by mouth daily before breakfast.    Yes Historical Provider, MD  guaiFENesin (MUCINEX) 600 MG 12 hr tablet Take 600 mg by mouth 2 (two) times daily as needed for cough.   Yes Historical Provider, MD  hydrALAZINE (APRESOLINE) 100 MG tablet Take 50 mg by mouth 2 (two) times daily.    Yes Historical Provider, MD  insulin aspart (NOVOLOG) 100 UNIT/ML injection Inject 5-10 Units into the skin 3 (three) times daily before meals. cbg 200-300 5 units; 301-400 8 units; 401-500 10 units Patient taking differently: Inject 5-10 Units into the skin 3 (three) times daily before meals. cbg <80 juice or glutose 15, 150-199=5 units, 200-299=8 units, 300or >  12units 07/22/13  Yes Janece Canterbury, MD  loratadine (CLARITIN) 10 MG tablet Take 10 mg by mouth daily as needed. allergies   Yes Historical Provider, MD  lubiprostone (AMITIZA) 24 MCG capsule Take 24 mcg by mouth 2 (two) times daily with a meal.   Yes Historical Provider, MD  mycophenolate (MYFORTIC) 180 MG EC tablet Take 180 mg by mouth 2 (two) times daily.   Yes Historical Provider, MD  oxyCODONE-acetaminophen (PERCOCET) 5-325 MG per tablet Take 1 tablet by mouth every 8 (eight) hours as needed for moderate pain. 07/15/14  Yes Pamella Pert, MD  polyethylene glycol (MIRALAX / GLYCOLAX) packet Take 17 g by mouth daily. M/W/F for constipation   Yes Historical Provider, MD  ranitidine (ZANTAC) 300 MG capsule Take 300 mg by mouth daily.   Yes Historical Provider, MD  sodium bicarbonate 650 MG tablet Take 650 mg by mouth 2 (two) times daily.    Yes Historical Provider, MD  tacrolimus (PROGRAF) 1 MG capsule Take 1 mg by mouth 2 (two) times daily.    Yes Historical Provider, MD  aspirin 81 MG tablet Take 81 mg by mouth daily.    Historical Provider, MD  megestrol (MEGACE) 400 MG/10ML suspension Take 400 mg by mouth daily as needed. For appetite    Historical Provider, MD   No Known Allergies CBC:    Component Value Date/Time   WBC 10.6* 12/31/2014 0700   HGB 9.1* 12/31/2014 0700   HCT 26.8* 12/31/2014 0700   PLT 216 12/31/2014 0700   MCV 79.3 12/31/2014 0700   NEUTROABS 4.6 11/12/2014 1948   LYMPHSABS 1.7 11/12/2014 1948   MONOABS 0.4 11/12/2014 1948   EOSABS 0.1 11/12/2014 1948   BASOSABS 0.0 11/12/2014 1948   Comprehensive Metabolic Panel:    Component Value Date/Time   NA 138 12/31/2014 0649   K 4.2 12/31/2014 0649   CL 105 12/31/2014 0649   CO2 21* 12/31/2014 0649   BUN 40* 12/31/2014 0649   CREATININE 1.75* 12/31/2014 0649   GLUCOSE 134* 12/31/2014 0649   CALCIUM 8.7* 12/31/2014 0649   CALCIUM 9.2 04/02/2013 1007   AST 20 11/12/2014 1948   ALT 23 11/12/2014 1948  ALKPHOS 56 11/12/2014 1948   BILITOT 0.5 11/12/2014 1948   PROT 7.5 11/12/2014 1948   ALBUMIN 4.0 11/18/2014 1051    Physical Exam: Vital Signs: BP 112/68 mmHg  Pulse 97  Temp(Src) 98.2 F (36.8 C) (Oral)  Resp 16  Ht _0  (1.803 m)  Wt 84.42 kg (186 lb 1.8 oz)  BMI 25.97 kg/m2  SpO2 90% SpO2: SpO2: 90 % O2 Device: O2 Device: Not Delivered O2 Flow Rate: O2 Flow Rate (L/min): 2 L/min Intake/output summary:  Intake/Output Summary (Last 24 hours) at 12/31/14 1530 Last data filed at 12/31/14 1514  Gross per 24 hour  Intake    913 ml  Output    800 ml  Net    113 ml   LBM:   Baseline Weight: Weight: 81.647 kg (180 lb) Most recent weight: Weight: 84.42 kg (186 lb 1.8 oz)  Exam Findings:  GEN: alert, NAD HEENT: [place], sclera anicteric, mmm ABD: soft, ND EXT: warm  Palliative Performance Scale: 30             Additional Data Reviewed: Recent Labs     12/30/14  0404  12/31/14  0649  12/31/14  0700  WBC  9.4   --   10.6*  HGB  8.7*   --   9.1*  PLT  219   --   216  NA  138  138   --   BUN  33*  40*   --   CREATININE  1.48*  1.75*   --      Time Total:  50 minutes  Greater than 50%  of this time was spent counseling and coordinating care related to the above assessment and plan.  Signed by: Isaac Laud, DO  Doran Clay, DO  12/31/2014, 3:30 PM  Please contact Palliative Medicine Team phone at [phone] for questions and concerns.

## 2014-12-31 NOTE — Progress Notes (Signed)
Pt given morphine for chest discomfort. Pt stated that it did help but did not take it all away. Franchot Erichsen

## 2014-12-31 NOTE — Progress Notes (Signed)
PROGRESS NOTE  Evan [name] VQQ:[id number] DOB: [birth year] DOA: 12/30/2014 PCP: Foye Spurling, MD  Assessment/Plan: Acute respiratory failure with hypoxia:  A) Aortic stenosis, severe  B) Acute on chronic diastolic CHF (congestive heart failure), NYHA class 2 -Appreciate cardiology assistance -Suspect severe aortic stenosis contributing to cardiac remodeling and associated CHF symptoms -Discussed with cardiologist on-call who suspects patient likely not candidate for TAVR procedure given history of renal transplant and underlying stage III chronic kidney disease since procedure would require cardiac catheterization and intravenous contrast-patient's primary outpatient cardiologist Dr. Tamala Julian is primary OP cardiologist -2-D echocardiogram: see below   Symptomatic anemia -Transfuse 1 unit packed red cells only at this point -Fecal occult positive so consult GI although given multiple medical problems aggressive workup likely limited (and suspect not indicated) since no gross active bleeding seen (see below)-underwent endoscopy with Dr. Watt Climes in 2013 -TSH ok -appears to be Fe deficient   Type 2 diabetes mellitus -Hold control acutely -Provide sliding scale insulin -Check hemoglobin A1c   H/O atrial fibrillation -Currently maintaining sinus rhythm -Continue Cardizem but may need to decrease dosage while diuresing to minimize hypotension   CKD (chronic kidney disease), stage III/History of renal transplant -Renal function stable and at baseline -Continue tacrolimus and Myfortic -Tacrolimus level normal   Positive D dimer -V/Q scan negative for PE  Elevated troponin -More reflective of renal dysfunction and heart failure with a likely component of demand ischemia -Patient not having chest pain or other classic ischemic symptoms   History of CVA/ FTT -Recent outpatient visit with neurologist -Differential at that time included the possibility of superimposed  parkinsonism, Parkinson's disease versus dementia with Lewy bodies this in regards to patient's progressive symptoms of general physical and cognitive decline with weakness tremor loss of appetite shortness of breath fatigue and increasing falls patient was also reporting hallucinations and confusion at night and therefore was felt not to be a good candidate for additional medical therapy. -As noted above palliative care was recommended at that time. -SLP    Decubitus ulcer of sacral region, stage 2 -Consult WOC RN -Air mattress to bed an air cushion pad to chair   patient has had progressive decline in function noting at recent visit to neurologist 11/09/14 recommendation was given for palliative care for pain management, symptom control and advanced care planning. -spoke with granddaughter and called palliative care consult   Code Status: full Family Communication: called granddaughter and discussed palliative care consult Disposition Plan:    Consultants:  Cards  GI  Palliative care  Procedures:  Echo: Study Conclusions  - Left ventricle: The cavity size was normal. Wall thickness was increased in a pattern of mild LVH. Systolic function was mildly reduced. The estimated ejection fraction was in the range of 45% to 50%. Diffuse hypokinesis. Doppler parameters are consistent with high ventricular filling pressure. - Aortic valve: A bicuspid morphology cannot be excluded; severely thickened, severely calcified leaflets. There was critical stenosis. There was mild regurgitation. Valve area (VTI): 0.65 cm^2. Valve area (Vmax): 0.73 cm^2. Valve area (Vmean): 0.6 cm^2. - Mitral valve: Calcified annulus. There was mild regurgitation. - Left atrium: The atrium was mildly dilated. - Pulmonary arteries: PA peak pressure: 38 mm Hg (S). - Pericardium, extracardiac: A trivial pericardial effusion was identified.    HPI/Subjective: Asking to go  home  Objective: Filed Vitals:   12/31/14 0608  BP: 122/43  Pulse: 92  Temp: 98.3 F (36.8 C)  Resp: 18    Intake/Output Summary (Last 24  hours) at 12/31/14 0956 Last data filed at 12/31/14 0951  Gross per 24 hour  Intake    673 ml  Output   1050 ml  Net   -377 ml   Filed Weights   12/30/14 0318 12/30/14 0810 12/31/14 0608  Weight: 81.647 kg (180 lb) 84.369 kg (186 lb) 84.42 kg (186 lb 1.8 oz)    Exam:   General:  Voice difficult to understand  Cardiovascular: rrr  Respiratory: diminshed  Abdomen: +BS, soft  Musculoskeletal: weak  Data Reviewed: Basic Metabolic Panel:  Recent Labs Lab 12/30/14 0404 12/31/14 0649  NA 138 138  K 4.3 4.2  CL 108 105  CO2 22 21*  GLUCOSE 189* 134*  BUN 33* 40*  CREATININE 1.48* 1.75*  CALCIUM 8.6* 8.7*   Liver Function Tests: No results for input(s): AST, ALT, ALKPHOS, BILITOT, PROT, ALBUMIN in the last 168 hours. No results for input(s): LIPASE, AMYLASE in the last 168 hours. No results for input(s): AMMONIA in the last 168 hours. CBC:  Recent Labs Lab 12/30/14 0404 12/31/14 0700  WBC 9.4 10.6*  HGB 8.7* 9.1*  HCT 25.9* 26.8*  MCV 79.7 79.3  PLT 219 216   Cardiac Enzymes:  Recent Labs Lab 12/30/14 0404  TROPONINI 0.27*   BNP (last 3 results)  Recent Labs  12/30/14 0404  BNP 629.4*    ProBNP (last 3 results) No results for input(s): PROBNP in the last 8760 hours.  CBG:  Recent Labs Lab 12/30/14 0914 12/30/14 1253 12/30/14 1715 12/30/14 2224 12/31/14 0755  GLUCAP 112* 126* 101* 139* 128*    No results found for this or any previous visit (from the past 240 hour(s)).   Studies: Nm Pulmonary Perf And Vent  12/30/2014   CLINICAL DATA:  Shortness of breath.  History of kidney transplant.  EXAM: NUCLEAR MEDICINE VENTILATION - PERFUSION LUNG SCAN  TECHNIQUE: Ventilation images were obtained in multiple projections using inhaled aerosol Tc-55m DTPA. Perfusion images were obtained in multiple  projections after intravenous injection of Tc-14m MAA.  RADIOPHARMACEUTICALS:  40 Technetium-70m DTPA aerosol inhalation and 6 Technetium-50m MAA IV  COMPARISON:  Chest radiographs obtained earlier today.  FINDINGS: Ventilation: Anterior artifact, possibly related to the ventilation mask. Taking this into account, normal ventilation of both lungs.  Perfusion: Anterior artifact, possibly related to the ventilation mask. Taking this into account, normal perfusion of both lungs with no perfusion defects.  IMPRESSION: No evidence of pulmonary embolism.   Electronically Signed   By: Claudie Revering M.D.   On: 12/30/2014 17:55   Dg Chest Port 1 View  12/30/2014   CLINICAL DATA:  Shortness of breath  EXAM: PORTABLE CHEST - 1 VIEW  COMPARISON:  10/29/2014  FINDINGS: There is bilateral fairly symmetric airspace disease, not seen previously. Possible small pleural effusions. No cardiomegaly. Stable mediastinal contours.  Limited evaluation the apices due to patient positioning.  No acute osseous findings.  IMPRESSION: Bilateral airspace disease, favor edema over multi focal infection.   Electronically Signed   By: Monte Fantasia M.D.   On: 12/30/2014 03:55    Scheduled Meds: . sodium chloride   Intravenous Once  . aspirin  81 mg Oral Daily  . diltiazem  300 mg Oral Daily  . famotidine  10 mg Oral Daily  . heparin  5,000 Units Subcutaneous 3 times per day  . hydrALAZINE  25 mg Oral BID  . insulin aspart  0-5 Units Subcutaneous QHS  . insulin aspart  0-9 Units Subcutaneous TID WC  .  megestrol  400 mg Oral Daily  . mycophenolate  180 mg Oral BID  . sodium bicarbonate  650 mg Oral BID  . sodium chloride  3 mL Intravenous Q12H  . 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,2013   History of renal transplant   H/O atrial fibrillation  without current medication   Aortic stenosis, severe   FTT (failure to thrive) in adult   Acute on chronic diastolic CHF (congestive heart failure), NYHA class 2   Diabetes mellitus type 2, controlled   CKD (chronic kidney disease), stage III   Symptomatic anemia   Acute respiratory failure with hypoxia   Decubitus ulcer of sacral region, stage 2   Positive D dimer   Acute diastolic heart failure, NYHA class 2    Time spent: 35 min    Laniqua Torrens  [facility] Pager [phone]. If 7PM-7AM, please contact night-coverage at [email or web address], password [facility] 12/31/2014, 9:56 AM  LOS: 1 day

## 2014-12-31 NOTE — Progress Notes (Signed)
Patient Name: Evan [name] Date of Encounter: 12/31/2014  Active Problems:   Type 2 diabetes mellitus   History of CVA (cerebrovascular accident)/left pontine infarct June 20,2013   History of renal transplant   H/O atrial fibrillation without current medication   Aortic stenosis, severe   FTT (failure to thrive) in adult   Acute on chronic diastolic CHF (congestive heart failure), NYHA class 2   Diabetes mellitus type 2, controlled   CKD (chronic kidney disease), stage III   Symptomatic anemia   Acute respiratory failure with hypoxia   Decubitus ulcer of sacral region, stage 2   Positive D dimer   Acute diastolic heart failure, NYHA class 2    SUBJECTIVE  Patient developed 10/10 substernal chest pain during PT evaluation after walking about 4 steps. Denies nausea, dizziness or palpitation. SOB stable. Reported unable to swallow yesterday, had speech eval this morning. He stated that his swallowing has improved.   CURRENT MEDS . sodium chloride   Intravenous Once  . aspirin  81 mg Oral Daily  . diltiazem  300 mg Oral Daily  . famotidine  10 mg Oral Daily  . heparin  5,000 Units Subcutaneous 3 times per day  . hydrALAZINE  25 mg Oral BID  . insulin aspart  0-5 Units Subcutaneous QHS  . insulin aspart  0-9 Units Subcutaneous TID WC  . megestrol  400 mg Oral Daily  . mycophenolate  180 mg Oral BID  . polyethylene glycol  17 g Oral Daily  . sodium bicarbonate  650 mg Oral BID  . sodium chloride  3 mL Intravenous Q12H  . tacrolimus  1 mg Oral BID    OBJECTIVE  Filed Vitals:   12/30/14 1851 12/30/14 1900 12/30/14 2105 12/31/14 0608  BP: 121/43 116/44 117/41 122/43  Pulse:   72 92  Temp:  97.4 F (36.3 C) 97.9 F (36.6 C) 98.3 F (36.8 C)  TempSrc:  Oral Oral Oral  Resp:   18 18  Height:      Weight:    186 lb 1.8 oz (84.42 kg)  SpO2:  95% 94% 97%    Intake/Output Summary (Last 24 hours) at 12/31/14 1300 Last data filed at 12/31/14 0951  Gross per 24 hour    Intake    673 ml  Output   1050 ml  Net   -377 ml   Filed Weights   12/30/14 0318 12/30/14 0810 12/31/14 4081  Weight: 180 lb (81.647 kg) 186 lb (84.369 kg) 186 lb 1.8 oz (84.42 kg)    PHYSICAL EXAM  General: Pleasant,ill appearing, old male in NAD. States closing his eyes removes his chest pain Neuro: Alert and oriented X 3. Moves all extremities spontaneously. Psych: Normal affect. HEENT:  Normal  Neck: Supple without bruits. + JVD. Lungs:  Resp regular and unlabored, CTA. Heart: RRR no s3, s4. GrIII/VI systolic murmur base   Abdomen: Soft, non-tender, non-distended, BS + x 4.  Extremities: No clubbing, cyanosis or edema. DP/PT/Radials 2+ and equal bilaterally.  Accessory Clinical Findings  CBC  Recent Labs  12/30/14 0404 12/31/14 0700  WBC 9.4 10.6*  HGB 8.7* 9.1*  HCT 25.9* 26.8*  MCV 79.7 79.3  PLT 219 448   Basic Metabolic Panel  Recent Labs  12/30/14 0404 12/31/14 0649  NA 138 138  K 4.3 4.2  CL 108 105  CO2 22 21*  GLUCOSE 189* 134*  BUN 33* 40*  CREATININE 1.48* 1.75*  CALCIUM 8.6* 8.7*  Cardiac Enzymes  Recent Labs  12/30/14 0404  TROPONINI 0.27*   BNP Invalid input(s): POCBNP D-Dimer  Recent Labs  12/30/14 0404  DDIMER 1.15*   Hemoglobin A1C  Recent Labs  12/30/14 0922  HGBA1C 6.1*   Fasting Lipid Panel No results for input(s): CHOL, HDL, LDLCALC, TRIG, CHOLHDL, LDLDIRECT in the last 72 hours. Thyroid Function Tests  Recent Labs  12/30/14 0922  TSH 2.981    TELE NSR with rates of 90s.   2D echo 12/30/2014 LV EF: 45% -  50%  ------------------------------------------------------------------- Indications:   Aortic stenosis 424.1. CHF - 428.0.  ------------------------------------------------------------------- History:  PMH:  Atrial fibrillation. Stroke. Risk factors: Diabetes mellitus.  ------------------------------------------------------------------- Study Conclusions  - Left ventricle: The  cavity size was normal. Wall thickness was increased in a pattern of mild LVH. Systolic function was mildly reduced. The estimated ejection fraction was in the range of 45% to 50%. Diffuse hypokinesis. Doppler parameters are consistent with high ventricular filling pressure. - Aortic valve: A bicuspid morphology cannot be excluded; severely thickened, severely calcified leaflets. There was critical stenosis. There was mild regurgitation. Valve area (VTI): 0.65 cm^2. Valve area (Vmax): 0.73 cm^2. Valve area (Vmean): 0.6 cm^2. - Mitral valve: Calcified annulus. There was mild regurgitation. - Left atrium: The atrium was mildly dilated. - Pulmonary arteries: PA peak pressure: 38 mm Hg (S). - Pericardium, extracardiac: A trivial pericardial effusion was identified.  Radiology/Studies  Nm Pulmonary Perf And Vent  12/30/2014   CLINICAL DATA:  Shortness of breath.  History of kidney transplant.  EXAM: NUCLEAR MEDICINE VENTILATION - PERFUSION LUNG SCAN  TECHNIQUE: Ventilation images were obtained in multiple projections using inhaled aerosol Tc-87m DTPA. Perfusion images were obtained in multiple projections after intravenous injection of Tc-32m MAA.  RADIOPHARMACEUTICALS:  40 Technetium-28m DTPA aerosol inhalation and 6 Technetium-83m MAA IV  COMPARISON:  Chest radiographs obtained earlier today.  FINDINGS: Ventilation: Anterior artifact, possibly related to the ventilation mask. Taking this into account, normal ventilation of both lungs.  Perfusion: Anterior artifact, possibly related to the ventilation mask. Taking this into account, normal perfusion of both lungs with no perfusion defects.  IMPRESSION: No evidence of pulmonary embolism.   Electronically Signed   By: Claudie Revering M.D.   On: 12/30/2014 17:55   Dg Chest Port 1 View  12/30/2014   CLINICAL DATA:  Shortness of breath  EXAM: PORTABLE CHEST - 1 VIEW  COMPARISON:  10/29/2014  FINDINGS: There is bilateral fairly symmetric  airspace disease, not seen previously. Possible small pleural effusions. No cardiomegaly. Stable mediastinal contours.  Limited evaluation the apices due to patient positioning.  No acute osseous findings.  IMPRESSION: Bilateral airspace disease, favor edema over multi focal infection.   Electronically Signed   By: Monte Fantasia M.D.   On: 12/30/2014 03:55    ASSESSMENT AND PLAN  1. Critical aortic stenosis-  concerning for bicuspid morphology.  - symptomatic with exertion. Has known murmur since childhood.  - Unfortunate case given renal transplant and chronic CKD - very high risk permanent renal injury if undergoes TAVR - For discussed with MD and check with Dr. Burt Knack or valvular specialist if other options are available, however likely limited given his comorbidity -Consulted palliative care, cardiology will wait for decision.  2. Exertional chest pain - happened during waking with PT - reassess chest pain with EKG,  differential diagnosis low coronary perfusion vs underlying CKD  3. Acute on chronic diastolic CHF in the setting of critical AS - As above. Given IV  lasix 60mg  once yesterday. Had 170ml of diuresis. Creatinine increased to 1.75 from 1.48. Hold lasix.   4. H/o of AF - Currently maintaining NSR  5. Anemia - per primary  6. CKD - history of renal transplant.   Signed, Bhagat,Bhavinkumar PA-C Pager [phone]  Patinet seen and examined  Agree with findings of B Bhagat above  Very difficult situation  81 yo with critical AS  Anemia.  CHF Worsening renal dysfunction  Now with chest pressure  EKG with strain  Evid of some CHF on exam   Have not even addressed possibility of native CAD in this setting Unfortunately he is not a candidate for any invasive Rx.   I would give GI cocktail  Could try MSO4  Low dose x 1 Follow Hgb Follow Cr  Give lasix as tolerated    Palliative care to see.    Dorris Carnes

## 2014-12-31 NOTE — Progress Notes (Signed)
Evan [name] 12:17 PM  Subjective: Patient's swallowing is better today but he has not had a bowel movement and no signs of bleeding and he wants to go home and is mostly concerned about his upright positioning and has no other complaints  Objective: Vital signs stable afebrile no acute distress abdomen is soft nontender hemoglobin increased a little  Assessment: Guaiac positivity in a patient with multiple medical problems  Plan: See yesterday's note for details consider a oral contrast only CT just to rule out anything big or  bad and I'm happy to see back when necessary and please call us if we can be of any further assistance with this hospital stay and consult swallowing team as needed  [facility]  Pager [phone] After 5PM or if no answer call [phone]

## 2014-12-31 NOTE — Progress Notes (Signed)
Attempted to see pt.  Pt just finished with PT and is having significant chest pain. Will defer this eval and see tomorrow. Jinger Neighbors, [place] [phone]

## 2015-01-01 DIAGNOSIS — R0689 Other abnormalities of breathing: Secondary | ICD-10-CM

## 2015-01-01 DIAGNOSIS — R0600 Dyspnea, unspecified: Secondary | ICD-10-CM | POA: Insufficient documentation

## 2015-01-01 DIAGNOSIS — E119 Type 2 diabetes mellitus without complications: Secondary | ICD-10-CM

## 2015-01-01 LAB — GLUCOSE, CAPILLARY
GLUCOSE-CAPILLARY: 174 mg/dL — AB (ref 65–99)
GLUCOSE-CAPILLARY: 160 mg/dL — AB (ref 65–99)
Glucose-Capillary: 157 mg/dL — ABNORMAL HIGH (ref 65–99)
Glucose-Capillary: 157 mg/dL — ABNORMAL HIGH (ref 65–99)

## 2015-01-01 LAB — BASIC METABOLIC PANEL
ANION GAP: 14 (ref 5–15)
BUN: 43 mg/dL — ABNORMAL HIGH (ref 6–20)
CO2: 19 mmol/L — ABNORMAL LOW (ref 22–32)
Calcium: 8.6 mg/dL — ABNORMAL LOW (ref 8.9–10.3)
Chloride: 106 mmol/L (ref 101–111)
Creatinine, Ser: 1.67 mg/dL — ABNORMAL HIGH (ref 0.61–1.24)
GFR calc Af Amer: 43 mL/min — ABNORMAL LOW (ref >60)
GFR calc non Af Amer: 37 mL/min — ABNORMAL LOW (ref >60)
GLUCOSE: 161 mg/dL — AB (ref 65–99)
Potassium: 4.1 mmol/L (ref 3.5–5.1)
SODIUM: 139 mmol/L (ref 135–145)

## 2015-01-01 MED ORDER — WHITE PETROLATUM GEL
Status: AC
  Administered 2015-01-01: 0.2
  Filled 2015-01-01: qty 1

## 2015-01-01 MED ORDER — MORPHINE SULFATE (CONCENTRATE) 10 MG/0.5ML PO SOLN
5.0000 mg | ORAL | Status: DC | PRN
  Administered 2015-01-03 – 2015-01-04 (×3): 5 mg via ORAL
  Filled 2015-01-01 (×3): qty 0.5

## 2015-01-01 NOTE — Progress Notes (Addendum)
Daily Progress Note   Patient Name: Evan [name]       Date: 01/01/2015 DOB: 09-Mar-1932  Age: 81 y.o. MRN#: [id number] Attending Physician: Geradine Girt, DO Primary Care Physician: Foye Spurling, MD Admit Date: 12/30/2014  Reason for Consultation/Follow-up: Establishing goals of care  Subjective: Pain better controlled  Length of Stay: 2 days  Current Medications: Scheduled Meds:  . sodium chloride   Intravenous Once  . aspirin  81 mg Oral Daily  . diltiazem  300 mg Oral Daily  . famotidine  10 mg Oral Daily  . heparin  5,000 Units Subcutaneous 3 times per day  . hydrALAZINE  25 mg Oral BID  . insulin aspart  0-5 Units Subcutaneous QHS  . insulin aspart  0-9 Units Subcutaneous TID WC  . megestrol  400 mg Oral Daily  . mycophenolate  180 mg Oral BID  . polyethylene glycol  17 g Oral Daily  . senna-docusate  1 tablet Oral QHS  . sodium bicarbonate  650 mg Oral BID  . sodium chloride  3 mL Intravenous Q12H  . tacrolimus  1 mg Oral BID    Continuous Infusions:    PRN Meds: sodium chloride, acetaminophen, loratadine, morphine injection, ondansetron (ZOFRAN) IV, oxyCODONE-acetaminophen, sodium chloride  Palliative Performance Scale: 30%     Vital Signs: BP 142/49 mmHg  Pulse 101  Temp(Src) 98.5 F (36.9 C) (Oral)  Resp 15  Ht 5\' 11"  (1.803 m)  Wt 83.4 kg (183 lb 13.8 oz)  BMI 25.66 kg/m2  SpO2 94% SpO2: SpO2: 94 % O2 Device: O2 Device: Not Delivered O2 Flow Rate: O2 Flow Rate (L/min): 2 L/min  Intake/output summary:  Intake/Output Summary (Last 24 hours) at 01/01/15 1259 Last data filed at 01/01/15 8882  Gross per 24 hour  Intake    540 ml  Output    200 ml  Net    340 ml   LBM:   Baseline Weight: Weight: 81.647 kg (180 lb) Most recent weight: Weight: 83.4 kg (183 lb 13.8 oz)  Physical Exam: GEN: alert, NAD HEENT: [place], mmm, sclera anicteric EXT: warm             Additional Data Reviewed: Recent Labs     12/30/14  0404  12/31/14  0649   12/31/14  0700  01/01/15  0440  WBC  9.4   --   10.6*   --   HGB  8.7*   --   9.1*   --   PLT  219   --   216   --   NA  138  138   --   139  BUN  33*  40*   --   43*  CREATININE  1.48*  1.75*   --   1.67*     Problem List:  Patient Active Problem List   Diagnosis Date Noted  . Palliative care encounter   . Ischemic chest pain   . CKD (chronic kidney disease), stage III 12/30/2014  . Symptomatic anemia 12/30/2014  . Acute respiratory failure with hypoxia 12/30/2014  . Decubitus ulcer of sacral region, stage 2 12/30/2014  . Positive D dimer 12/30/2014  . Acute diastolic heart failure, NYHA class 2 12/30/2014  . Diabetes mellitus type 2, controlled 07/18/2013  . FTT (failure to thrive) in adult 03/25/2012  . Acute on chronic diastolic CHF (congestive heart failure), NYHA class 2 03/25/2012  . H/O carotid stenosis <60% 03/25/2012  . Fall at nursing home- 7  days ago 03/25/2012  . Osteoarthritis, knee 03/04/2012  . Aortic stenosis, severe 01/28/2012  . Type 2 diabetes mellitus 01/24/2012  . History of CVA (cerebrovascular accident)/left pontine infarct June 20,2013 01/24/2012  . History of renal transplant 01/24/2012  . H/O: duodenal ulcer 01/24/2012  . H/O atrial fibrillation without current medication 01/24/2012  . CA prostate, adenoca 01/24/2012     Palliative Care Assessment & Plan    Code Status:  DNR  Goals of Care:  Discussed situation with patient, granddaughter [name] and her sister and fiancee, wife. Reviewed information from cardiology that treatment options unfortunately limited. TAVR hindered by renal transplant. Difficulty diuresing with creatinine elevation as well. Reportedly this has been a problem in the past as well.  Given this difficult situation, comfort care may be only option. We talked through all this today. Emotional meeting which Trilby Drummer participated in.  His goal is to get home and they wish to look into hospice services at home to help ensure  comfort.  Tarius wants to go home as soon as possible and tells family he does not like being in hospital.  We talked about family care requirements with home hospice and how hospice does not provide 24h care at home.  They are going to discuss and try to pull resources together. I have consulted case management to help them with hospice choice and hearing more from a hospice liaison.  I think if they decide he can't go home, residential hospice likely an option. Their goal is to get him home though. We talked about prognosis being potentially only a few weeks. He became very symptomatic only briefly working with PT. Discussed code status in light of findings here with critical AS and patient/family agree that DNR makes sense. Focus on comfort.    3. Symptom Management:  Chest Pain/Dyspnea- IV morphine available here. I will add oral roxanol as this is what he would be going home with.    4. Palliative Prophylaxis:  Stool Softner: senna  5. Prognosis: weeks to months, but I suspect given inability to effectively use diuretics at this point more likely in few weeks range.   6. Discharge Planning: Home with Hospice   Care plan was discussed with patient/family  Thank you for allowing the Palliative Medicine Team to assist in the care of this patient.  Total Time: 40 minutes Greater than 50%  of this time was spent counseling and coordinating care related to the above assessment and plan.   Doran Clay, DO  01/01/2015, 12:59 PM  Please contact Palliative Medicine Team phone at [phone] for questions and concerns.

## 2015-01-01 NOTE — Progress Notes (Addendum)
PROGRESS NOTE  Evan [name] HKV:[id number] DOB: Dec 08, 1931 DOA: 12/30/2014 PCP: Foye Spurling, MD  Assessment/Plan: Acute respiratory failure with hypoxia:  A) Aortic stenosis, severe  B) Acute on chronic diastolic CHF (congestive heart failure), NYHA class 2 -Appreciate cardiology assistance -Suspect severe aortic stenosis contributing to cardiac remodeling and associated CHF symptoms -Discussed with cardiologist on-call who suspects patient likely not candidate for TAVR procedure given history of renal transplant and underlying stage III chronic kidney disease since procedure would require cardiac catheterization and intravenous contrast-patient's primary outpatient cardiologist Dr. Tamala Julian is primary OP cardiologist -2-D echocardiogram: see below   Symptomatic anemia -Transfused 1 unit packed red cells  -Fecal occult positive so consult GI although given multiple medical problems aggressive workup likely limited (and suspect not indicated) since no gross active bleeding seen (see below)-underwent endoscopy with Dr. Watt Climes in 2013-- suggest a CT scan of abd- will hold until after palliative care meeting -TSH ok -appears to be Fe deficient   Type 2 diabetes mellitus -Hold control acutely -Provide sliding scale insulin -Check hemoglobin A1c   H/O atrial fibrillation -Currently maintaining sinus rhythm -Continue Cardizem but may need to decrease dosage while diuresing to minimize hypotension   CKD (chronic kidney disease), stage III/History of renal transplant -Renal function stable and at baseline -Continue tacrolimus and Myfortic -Tacrolimus level normal   Positive D dimer -V/Q scan negative for PE  Elevated troponin -More reflective of renal dysfunction and heart failure with a likely component of demand ischemia -Patient not having chest pain or other classic ischemic symptoms   History of CVA/ FTT -Recent outpatient visit with neurologist -Differential at  that time included the possibility of superimposed parkinsonism, Parkinson's disease versus dementia with Lewy bodies this in regards to patient's progressive symptoms of general physical and cognitive decline with weakness tremor loss of appetite shortness of breath fatigue and increasing falls patient was also reporting hallucinations and confusion at night and therefore was felt not to be a good candidate for additional medical therapy. -As noted above palliative care was recommended at that time. -SLP    Decubitus ulcer of sacral region, stage 2- present on admission -Consult WOC RN -Air mattress to bed an air cushion pad to chair   patient has had progressive decline in function noting at recent visit to neurologist 11/09/14 recommendation was given for palliative care for pain management, symptom control and advanced care planning. -spoke with granddaughter and called palliative care consult- meeting 5/28   Code Status: full Family Communication: called granddaughter and discussed palliative care consult 5/27 Disposition Plan:    Consultants:  Cards  GI  Palliative care  Procedures:  Echo: Study Conclusions  - Left ventricle: The cavity size was normal. Wall thickness was increased in a pattern of mild LVH. Systolic function was mildly reduced. The estimated ejection fraction was in the range of 45% to 50%. Diffuse hypokinesis. Doppler parameters are consistent with high ventricular filling pressure. - Aortic valve: A bicuspid morphology cannot be excluded; severely thickened, severely calcified leaflets. There was critical stenosis. There was mild regurgitation. Valve area (VTI): 0.65 cm^2. Valve area (Vmax): 0.73 cm^2. Valve area (Vmean): 0.6 cm^2. - Mitral valve: Calcified annulus. There was mild regurgitation. - Left atrium: The atrium was mildly dilated. - Pulmonary arteries: PA peak pressure: 38 mm Hg (S). - Pericardium, extracardiac: A trivial  pericardial effusion was identified.    HPI/Subjective: C/o neck pain (neck appears to be flexed forward )  Objective: Filed Vitals:   01/01/15 5643  BP: 142/49  Pulse:   Temp: 98.5 F (36.9 C)  Resp: 15    Intake/Output Summary (Last 24 hours) at 01/01/15 0911 Last data filed at 01/01/15 0118  Gross per 24 hour  Intake    420 ml  Output    400 ml  Net     20 ml   Filed Weights   12/30/14 0810 12/31/14 0608 01/01/15 0526  Weight: 84.369 kg (186 lb) 84.42 kg (186 lb 1.8 oz) 83.4 kg (183 lb 13.8 oz)    Exam:   General:  Voice difficult to understand  Cardiovascular: rrr  Respiratory: diminshed  Abdomen: +BS, soft  Musculoskeletal: weak  Data Reviewed: Basic Metabolic Panel:  Recent Labs Lab 12/30/14 0404 12/31/14 0649 01/01/15 0440  NA 138 138 139  K 4.3 4.2 4.1  CL 108 105 106  CO2 22 21* 19*  GLUCOSE 189* 134* 161*  BUN 33* 40* 43*  CREATININE 1.48* 1.75* 1.67*  CALCIUM 8.6* 8.7* 8.6*   Liver Function Tests: No results for input(s): AST, ALT, ALKPHOS, BILITOT, PROT, ALBUMIN in the last 168 hours. No results for input(s): LIPASE, AMYLASE in the last 168 hours. No results for input(s): AMMONIA in the last 168 hours. CBC:  Recent Labs Lab 12/30/14 0404 12/31/14 0700  WBC 9.4 10.6*  HGB 8.7* 9.1*  HCT 25.9* 26.8*  MCV 79.7 79.3  PLT 219 216   Cardiac Enzymes:  Recent Labs Lab 12/30/14 0404  TROPONINI 0.27*   BNP (last 3 results)  Recent Labs  12/30/14 0404  BNP 629.4*    ProBNP (last 3 results) No results for input(s): PROBNP in the last 8760 hours.  CBG:  Recent Labs Lab 12/31/14 0755 12/31/14 1238 12/31/14 1703 12/31/14 2121 01/01/15 0800  GLUCAP 128* 154* 129* 126* 174*    No results found for this or any previous visit (from the past 240 hour(s)).   Studies: Nm Pulmonary Perf And Vent  12/30/2014   CLINICAL DATA:  Shortness of breath.  History of kidney transplant.  EXAM: NUCLEAR MEDICINE VENTILATION -  PERFUSION LUNG SCAN  TECHNIQUE: Ventilation images were obtained in multiple projections using inhaled aerosol Tc-13m DTPA. Perfusion images were obtained in multiple projections after intravenous injection of Tc-77m MAA.  RADIOPHARMACEUTICALS:  40 Technetium-51m DTPA aerosol inhalation and 6 Technetium-71m MAA IV  COMPARISON:  Chest radiographs obtained earlier today.  FINDINGS: Ventilation: Anterior artifact, possibly related to the ventilation mask. Taking this into account, normal ventilation of both lungs.  Perfusion: Anterior artifact, possibly related to the ventilation mask. Taking this into account, normal perfusion of both lungs with no perfusion defects.  IMPRESSION: No evidence of pulmonary embolism.   Electronically Signed   By: Claudie Revering M.D.   On: 12/30/2014 17:55    Scheduled Meds: . sodium chloride   Intravenous Once  . aspirin  81 mg Oral Daily  . diltiazem  300 mg Oral Daily  . famotidine  10 mg Oral Daily  . heparin  5,000 Units Subcutaneous 3 times per day  . hydrALAZINE  25 mg Oral BID  . insulin aspart  0-5 Units Subcutaneous QHS  . insulin aspart  0-9 Units Subcutaneous TID WC  . megestrol  400 mg Oral Daily  . mycophenolate  180 mg Oral BID  . polyethylene glycol  17 g Oral Daily  . senna-docusate  1 tablet Oral QHS  . sodium bicarbonate  650 mg Oral BID  . sodium chloride  3 mL Intravenous Q12H  . 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,2013   History of renal transplant   H/O atrial fibrillation without current medication   Aortic stenosis, severe   FTT (failure to thrive) in adult   Acute on chronic diastolic CHF (congestive heart failure), NYHA class 2   Diabetes mellitus type 2, controlled   CKD (chronic kidney disease), stage III   Symptomatic anemia   Acute respiratory  failure with hypoxia   Decubitus ulcer of sacral region, stage 2   Positive D dimer   Acute diastolic heart failure, NYHA class 2   Palliative care encounter   Ischemic chest pain    Time spent: 25 min    Delsa Walder  [facility] Pager [phone]. If 7PM-7AM, please contact night-coverage at [email or web address], password [facility] 01/01/2015, 9:11 AM  LOS: 2 days

## 2015-01-02 LAB — GLUCOSE, CAPILLARY
GLUCOSE-CAPILLARY: 166 mg/dL — AB (ref 65–99)
GLUCOSE-CAPILLARY: 177 mg/dL — AB (ref 65–99)
Glucose-Capillary: 156 mg/dL — ABNORMAL HIGH (ref 65–99)
Glucose-Capillary: 187 mg/dL — ABNORMAL HIGH (ref 65–99)

## 2015-01-02 NOTE — Care Management Note (Signed)
Case Management Note  Patient Details  Name: Evan [name] MRN: [id number] Date of Birth: 18-Sep-1931  Subjective/Objective:                   Acute respiratory failure with hypoxia:  A) Aortic stenosis, severe  B) Acute on chronic diastolic CHF (congestive heart failure), NYHA class 2 Action/Plan: Discharge planning  Expected Discharge Date:  01/03/15               Expected Discharge Plan:  [place]  In-House Referral:     Discharge planning Services  CM Consult  Post Acute Care Choice:    Choice offered to:  Spouse, Adult Children  DME Arranged:    DME Agency:     HH Arranged:    [place] Agency:     Status of Service:  Completed, signed off  Medicare Important Message Given:    Date Medicare IM Given:    Medicare IM give by:    Date Additional Medicare IM Given:    Additional Medicare Important Message give by:     If discussed at [place] of Stay Meetings, dates discussed:    Additional Comments: CM met with pt, pt's spouse, Romie Minus and pt's grandaughter, Sander Nephew [phone] to discuss home with hospice.  Family is in agreement they do not have the adequate support they need for the day to day care and round the clock availability to care for pt at home. This reality is difficult for all of the family members and feel pt needs to go to [facility].  The pt states he just wants to go home but drifts in and out and is unable to stay focused long enough to discuss alternate plans.  The pt DOES NOT LIVE AT [facility]-this was a short respite stay while his wife recuperated at [facility] from hip surgery.  Family is very loving and tight knit and in agreement the responsibility of day-to-day care would be best at a residential hospice [facility]) and their energy can be spent "loving" the patient.  CM relayed this information to RN.  CSW will re-visit this tomorrow 01/03/15. CM does not have any Advice worker to give the family but did give  them information off the internet to look over and discuss prior to meeting with CSW tomorrow.  No other CM needs were communicated. Dellie Catholic, RN 01/02/2015, 5:34 PM

## 2015-01-02 NOTE — Clinical Social Work Note (Signed)
Clinical Social Work Assessment  Patient Details  Name: Evan [name] MRN: [id number] Date of Birth: [birth year]  Date of referral:  01/02/15               Reason for consult:  Other (Comment Required) (Admitted from [facility])                Permission sought to share information with:  Case Manager, Customer service manager, Family Supports Permission granted to share information::     Name::     granddaughter [name], patient wife:  Romie Minus  Agency::  [facility]  Relationship::     Contact Information:     Housing/Transportation Living arrangements for the past 2 months:  [place] of Information:  Patient, Medical Team, Spouse Patient Interpreter Needed:  None Criminal Activity/Legal Involvement Pertinent to Current Situation/Hospitalization:  No - Comment as needed Significant Relationships:  Adult Children, Spouse, Other Family Members Lives with:    Do you feel safe going back to the place where you live?  Yes Need for family participation in patient care:  Yes (Comment)  Care giving concerns:  Family completed [place] meeting with palliative and working to devise a plan.  Patient for ALF and need additional resources with family if going home. At this time not interested in SNF, unless last resort. Was at [employer] assessment / plan:  LCSW first consulted for higher level of care as patient is from ALF, [facility].  Family has been working with palliative care/GOC meeting.  At this time family is wanting patient to go home and patient reporting he wants to go home.  Hospice is working on a plan for home with hospice or residential hospice.   Employment status:  Retired Forensic scientist:  Medicare PT Recommendations:  [place] / Referral to community resources:  [place]  Patient/Family's Response to care:  Family still deciding details regarding plan at DC. Currently  agreeable with patient for home.  Patient/Family's Understanding of and Emotional Response to Diagnosis, Current Treatment, and Prognosis:  Family working with palliative, understanding of patient's decline and emotional meeting with palliative. Family working to honor patient wishes of home.  Emotional Assessment Appearance:  Appears older than stated age Attitude/Demeanor/Rapport:  Other (Calm, pleasant) Affect (typically observed):  Accepting, Pleasant Orientation:  Oriented to Self, Oriented to Place Alcohol / Substance use:  Not Applicable Psych involvement (Current and /or in the community):  No (Comment)  Discharge Needs  Concerns to be addressed:  Adjustment to Illness Readmission within the last 30 days:  No Current discharge risk:  None Barriers to Discharge:  Continued Medical Work up, Family Issues (No issue with family, just family decisions.)   [place], LCSW 01/02/2015, 10:14 AM

## 2015-01-02 NOTE — Progress Notes (Signed)
PROGRESS NOTE  Evan [name] SWF:[id number] DOB: [birth year] DOA: 12/30/2014 PCP: Foye Spurling, MD  Assessment/Plan: Acute respiratory failure with hypoxia:  A) Aortic stenosis, severe  B) Acute on chronic diastolic CHF (congestive heart failure), NYHA class 2 -Appreciate cardiology assistance -Suspect severe aortic stenosis contributing to cardiac remodeling and associated CHF symptoms -Discussed with cardiologist on-call who suspects patient likely not candidate for TAVR procedure given history of renal transplant and underlying stage III chronic kidney disease since procedure would require cardiac catheterization and intravenous contrast-patient's primary outpatient cardiologist Dr. Tamala Julian is primary OP cardiologist -2-D echocardiogram: see below   Symptomatic anemia -Transfused 1 unit packed red cells  -Fecal occult positive so consult GI although given multiple medical problems aggressive workup likely limited (and suspect not indicated) since no gross active bleeding seen (see below)-underwent endoscopy with Dr. Watt Climes in 2013-- suggest a CT scan of abd- will hold until after palliative care meeting -TSH ok -appears to be Fe deficient   Type 2 diabetes mellitus -Hold control acutely -Provide sliding scale insulin -Check hemoglobin A1c   H/O atrial fibrillation -Currently maintaining sinus rhythm -Continue Cardizem but may need to decrease dosage while diuresing to minimize hypotension   CKD (chronic kidney disease), stage III/History of renal transplant -Renal function stable and at baseline -Continue tacrolimus and Myfortic -Tacrolimus level normal   Positive D dimer -V/Q scan negative for PE  Elevated troponin -More reflective of renal dysfunction and heart failure with a likely component of demand ischemia -Patient not having chest pain or other classic ischemic symptoms   History of CVA/ FTT -Recent outpatient visit with neurologist -Differential at  that time included the possibility of superimposed parkinsonism, Parkinson's disease versus dementia with Lewy bodies this in regards to patient's progressive symptoms of general physical and cognitive decline with weakness tremor loss of appetite shortness of breath fatigue and increasing falls patient was also reporting hallucinations and confusion at night and therefore was felt not to be a good candidate for additional medical therapy. -As noted above palliative care was recommended at that time. -SLP    Decubitus ulcer of sacral region, stage 2- present on admission -Consult WOC RN -Air mattress to bed an air cushion pad to chair   Plan is for patient to go home or inpatient hospice  Code Status: DNR Family Communication: called granddaughter and discussed palliative care consult 5/27 Disposition Plan:    Consultants:  Cards  GI  Palliative care  Procedures:  Echo: Study Conclusions  - Left ventricle: The cavity size was normal. Wall thickness was increased in a pattern of mild LVH. Systolic function was mildly reduced. The estimated ejection fraction was in the range of 45% to 50%. Diffuse hypokinesis. Doppler parameters are consistent with high ventricular filling pressure. - Aortic valve: A bicuspid morphology cannot be excluded; severely thickened, severely calcified leaflets. There was critical stenosis. There was mild regurgitation. Valve area (VTI): 0.65 cm^2. Valve area (Vmax): 0.73 cm^2. Valve area (Vmean): 0.6 cm^2. - Mitral valve: Calcified annulus. There was mild regurgitation. - Left atrium: The atrium was mildly dilated. - Pulmonary arteries: PA peak pressure: 38 mm Hg (S). - Pericardium, extracardiac: A trivial pericardial effusion was identified.    HPI/Subjective: Speech is difficult to understand, does not appear in any pain  Objective: Filed Vitals:   01/02/15 0458  BP: 116/49  Pulse: 84  Temp: 98.5 F (36.9 C)  Resp:  22    Intake/Output Summary (Last 24 hours) at 01/02/15 1033 Last data filed  at 01/02/15 0900  Gross per 24 hour  Intake    491 ml  Output      0 ml  Net    491 ml   Filed Weights   12/31/14 0608 01/01/15 0526 01/02/15 0549  Weight: 84.42 kg (186 lb 1.8 oz) 83.4 kg (183 lb 13.8 oz) 72.2 kg (159 lb 2.8 oz)    Exam:   General:  Voice difficult to understand  Cardiovascular: rrr  Respiratory: diminshed  Abdomen: +BS, soft  Musculoskeletal: weak  Data Reviewed: Basic Metabolic Panel:  Recent Labs Lab 12/30/14 0404 12/31/14 0649 01/01/15 0440  NA 138 138 139  K 4.3 4.2 4.1  CL 108 105 106  CO2 22 21* 19*  GLUCOSE 189* 134* 161*  BUN 33* 40* 43*  CREATININE 1.48* 1.75* 1.67*  CALCIUM 8.6* 8.7* 8.6*   Liver Function Tests: No results for input(s): AST, ALT, ALKPHOS, BILITOT, PROT, ALBUMIN in the last 168 hours. No results for input(s): LIPASE, AMYLASE in the last 168 hours. No results for input(s): AMMONIA in the last 168 hours. CBC:  Recent Labs Lab 12/30/14 0404 12/31/14 0700  WBC 9.4 10.6*  HGB 8.7* 9.1*  HCT 25.9* 26.8*  MCV 79.7 79.3  PLT 219 216   Cardiac Enzymes:  Recent Labs Lab 12/30/14 0404  TROPONINI 0.27*   BNP (last 3 results)  Recent Labs  12/30/14 0404  BNP 629.4*    ProBNP (last 3 results) No results for input(s): PROBNP in the last 8760 hours.  CBG:  Recent Labs Lab 01/01/15 0800 01/01/15 1313 01/01/15 1740 01/01/15 2148 01/02/15 0806  GLUCAP 174* 157* 160* 157* 166*    No results found for this or any previous visit (from the past 240 hour(s)).   Studies: No results found.  Scheduled Meds: . sodium chloride   Intravenous Once  . aspirin  81 mg Oral Daily  . diltiazem  300 mg Oral Daily  . famotidine  10 mg Oral Daily  . heparin  5,000 Units Subcutaneous 3 times per day  . hydrALAZINE  25 mg Oral BID  . insulin aspart  0-5 Units Subcutaneous QHS  . insulin aspart  0-9 Units Subcutaneous TID WC  .  megestrol  400 mg Oral Daily  . mycophenolate  180 mg Oral BID  . polyethylene glycol  17 g Oral Daily  . senna-docusate  1 tablet Oral QHS  . sodium bicarbonate  650 mg Oral BID  . sodium chloride  3 mL Intravenous Q12H  . 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,2013   History of renal transplant   H/O atrial fibrillation without current medication   Aortic stenosis, severe   FTT (failure to thrive) in adult   Acute on chronic diastolic CHF (congestive heart failure), NYHA class 2   Diabetes mellitus type 2, controlled   CKD (chronic kidney disease), stage III   Symptomatic anemia   Acute respiratory failure with hypoxia   Decubitus ulcer of sacral region, stage 2   Positive D dimer   Acute diastolic heart failure, NYHA class 2   Palliative care encounter   Ischemic chest pain   Dyspnea and respiratory abnormality    Time spent: 25 min    VANN, JESSICA  [facility] Pager [phone]. If 7PM-7AM, please  contact night-coverage at [email or web address], password [facility] 01/02/2015, 10:33 AM  LOS: 3 days

## 2015-01-03 LAB — GLUCOSE, CAPILLARY
GLUCOSE-CAPILLARY: 172 mg/dL — AB (ref 65–99)
GLUCOSE-CAPILLARY: 202 mg/dL — AB (ref 65–99)
Glucose-Capillary: 175 mg/dL — ABNORMAL HIGH (ref 65–99)
Glucose-Capillary: 202 mg/dL — ABNORMAL HIGH (ref 65–99)

## 2015-01-03 MED ORDER — MORPHINE SULFATE 2 MG/ML IJ SOLN: 2.0000 mg | Freq: Once | INTRAMUSCULAR | Status: DC

## 2015-01-03 NOTE — Progress Notes (Signed)
Pt now has plan in place for palliative care. Cardiology will sign off. Please call with questions.   Evan [name] 01/03/2015 7:37 AM

## 2015-01-03 NOTE — Evaluation (Signed)
Occupational Therapy Evaluation Patient Details Name: Evan [name] MRN: [id number] DOB: [birth year] Today's Date: 01/03/2015    History of Present Illness This is a very pleasant and soft-spoken 81 year old male patient with history of diabetes, chronic kidney disease stage III status post renal transplant 2012, paroxysmal atrial fibrillation on rate control agents only, remote CVA with residual right-sided hemiparesis, hypertension, grade 2 diastolic dysfunction as well as severe aortic stenosis (based on echocardiogram in 2013), and sacral decubitus. He was sent over from the skilled nursing facility due to acute onset of shortness of breath.    Clinical Impression   Focus of session was positioning as pt stating his back was hurting so encouraged EOB sitting for several minutes for pressure relief/change of position. Pt wanting to get mouth cleaned from "gunk" so assisted with setup of oral care swab and pt able to complete this on his own. Pt very tight in UEs with PROM attempts. Pt complaining of neck and back pain at rest. Will follow on acute for positioning/family education depending also on d/c plan/changes.    Follow Up Recommendations  SNF;Supervision/Assistance - 24 hour;Other (comment) (appears plan is toward residential hospice.)    Equipment Recommendations  None recommended by OT    Recommendations for Other Services       Precautions / Restrictions Precautions Precautions: Fall Restrictions Weight Bearing Restrictions: No Other Position/Activity Restrictions: Pt has severe forward head and kyphosis which causes difficulty iwth positioning.        Mobility Bed Mobility Overal bed mobility: +2 for physical assistance;Needs Assistance Bed Mobility: Supine to Sit;Sit to Supine     Supine to sit: +2 for physical assistance;Total assist Sit to supine: +2 for physical assistance;Total assist   General bed mobility comments: + 2 assist for all aspects bed  mobility. Patient with very little assistance with LEs but required total support for trucal support up into sitting. Total A for positioning back into bed.   Transfers                 General transfer comment: Unable    Balance     Sitting balance-Leahy Scale: Zero Sitting balance - Comments: Patient with heavy posterior lean this session. Max cues to use UEs and lean forward. Sat EOB ~7 mins to attempt to take some meds but then requesting to lay back down in the bed.                                     ADL Overall ADL's : Needs assistance/impaired     Grooming: Oral care;Set up;Bed level Grooming Details (indicate cue type and reason): pt swabbed out mouth as pt complaining of "gunk" in his mouth.                                General ADL Comments: Worked on sitting at EOB for approximately 7 minutes with total assist for sitting balance support. Nursing in to give meds and pt with very kyphotic forward flexed posture and neck also very flexed so it was difficult to take his pills. Appears plan is for residential hospice currently.      Vision     Perception     Praxis      Pertinent Vitals/Pain Pain Assessment: Faces Faces Pain Scale: Hurts even more Pain Location: back pain Pain Intervention(s): Repositioned  Hand Dominance     Extremity/Trunk Assessment Upper Extremity Assessment Upper Extremity Assessment: RUE deficits/detail;LUE deficits/detail RUE Deficits / Details: extremely tight with all attempts at [facility]. pt able to squeeze therapist's hand but weak.  LUE Deficits / Details: same as R           Communication Communication Communication:  (soft spoken but no difficulties)   Cognition Arousal/Alertness: Awake/alert Behavior During Therapy: WFL for tasks assessed/performed Overall Cognitive Status: Within Functional Limits for tasks assessed                     General Comments       Exercises        Shoulder Instructions      Home Living Family/patient expects to be discharged to:: Private residence Living Arrangements: Spouse/significant other Available Help at Discharge: Family;Personal care attendant;Available PRN/intermittently Type of Home: House Home Access: Ramped entrance     Home Layout: One level               Home Equipment: Walker - 2 wheels;Bedside commode;Shower seat (lift chair)   Additional Comments: Pt lives in his home with wife and private pay caregivers.  Pt was in [place] for last week and half while wife recovering from hip replacement and then pt was to return home.  pt has granddaughter that is local and involved in pt care  pt hires private sitters that help him with all positioning and toileting - he must wait for them to come for any toileting.  pt is heavy mod assist transfer from wheelchair and some of his caregivers have given their notice as pt hard to handle and hard on their backs.  Pt spends all his ime in lift chair (this is the only way he can get his neck comfortable) and ipresssure issues have been a problem - but pt fully healed as of last week.      Prior Functioning/Environment Level of Independence: Needs assistance  Gait / Transfers Assistance Needed: As of last week - pt able to walk with RW for 30 feet distances - with wheelchiar following him.  min assist for transfers from lift cahir and mod assist from wheelchair and BSC     Comments: P thas been at [facility] for week and half - private pay - while wife recovering from surgery.  Granddaughter [name] has been his contact person - organizing his care    OT Diagnosis: Generalized weakness;Acute pain   OT Problem List: Decreased strength;Decreased knowledge of use of DME or AE;Pain;Decreased activity tolerance   OT Treatment/Interventions:      OT Goals(Current goals can be found in the care plan section) Acute Rehab OT Goals Patient Stated Goal: pt wanting to wash  out his mouth OT Goal Formulation: With patient Time For Goal Achievement: 01/17/15 Potential to Achieve Goals: Fair  OT Frequency:     Barriers to D/C:            Co-evaluation PT/OT/SLP Co-Evaluation/Treatment: Yes Reason for Co-Treatment: Complexity of the patient's impairments (multi-system involvement);For patient/therapist safety PT goals addressed during session: Mobility/safety with mobility;Balance OT goals addressed during session: ADL's and self-care;Proper use of Adaptive equipment and DME      End of Session    Activity Tolerance: Patient limited by fatigue;Patient limited by pain Patient left: in bed;with call bell/phone within reach   Time: 0941-1002 OT Time Calculation (min): 21 min Charges:  OT General Charges $OT Visit: 1 Procedure OT Evaluation $Initial OT  Evaluation Tier I: 1 Procedure G-Codes:    Jules Schick  [phone] 01/03/2015, 11:53 AM

## 2015-01-03 NOTE — Progress Notes (Signed)
Summary:  CSW made referral to Erling Conte, LCSW Liaison for [facility] this morning as patient's family has opted to seek residental hospice services.  CSW was notified by Harmon Pier and by patient's granddaughter [name] that patient will be accepted by [facility] tomorrow.  Patient had hoped to return home but is able to verbalize understanding that his wife cannot manage his care at home due to her own health issues.  He is agreeable to [facility]. CSW notified Dr. Eliseo Squires of above and current plan is for d/c to [facility] tomorrow.  Patient's nurse- Ginger notified of above.  Lorie Phenix. Pauline Good, [place]

## 2015-01-03 NOTE — Progress Notes (Signed)
Physical Therapy Treatment Patient Details Name: Evan [name] MRN: [id number] DOB: 11-17-1931 Today's Date: 01/03/2015    History of Present Illness This is a very pleasant and soft-spoken 81 year old male patient with history of diabetes, chronic kidney disease stage III status post renal transplant 2012, paroxysmal atrial fibrillation on rate control agents only, remote CVA with residual right-sided hemiparesis, hypertension, grade 2 diastolic dysfunction as well as severe aortic stenosis (based on echocardiogram in 2013), and sacral decubitus. He was sent over from the skilled nursing facility due to acute onset of shortness of breath.     PT Comments    Patient very limited by weakness and pain in sitting. Patient with complaints of back pain throughout. Patient stated it felt "awkward" to be sitting up and requested after minutes of sitting to return back to supine. Per note, family understands that they cannot provide care for him at home. They are planning for placement at Hospice. If patient continues on caseload by end of week, will educate family on positioning.   Follow Up Recommendations  SNF;Supervision/Assistance - 24 hour (Patient planning for hospice care)     Equipment Recommendations  None recommended by PT    Recommendations for Other Services       Precautions / Restrictions Precautions Precautions: Fall Restrictions Weight Bearing Restrictions: No Other Position/Activity Restrictions: Pt has severe forward head and kyphosis which causes difficulty iwth positioning.      Mobility  Bed Mobility Overal bed mobility: +2 for physical assistance;Needs Assistance Bed Mobility: Supine to Sit;Sit to Supine     Supine to sit: +2 for physical assistance;Total assist Sit to supine: +2 for physical assistance;Total assist   General bed mobility comments: + 2 assist for all aspects bed mobility. Patient with very little assistance with LEs but required total support  for trucal support up into sitting. Total A for positioning back into bed.   Transfers                 General transfer comment: Unable  Ambulation/Gait             General Gait Details: unable to attempt   Stairs            Wheelchair Mobility    Modified Rankin (Stroke Patients Only)       Balance     Sitting balance-Leahy Scale: Zero Sitting balance - Comments: Patient with heavy posterior lean this session. Max cues to use UEs and lean forward. Sat EOB ~7 mins to attempt to take some meds but then requesting to lay back down in the bed.                             Cognition Arousal/Alertness: Awake/alert Behavior During Therapy: WFL for tasks assessed/performed Overall Cognitive Status: Within Functional Limits for tasks assessed                      Exercises      General Comments        Pertinent Vitals/Pain Pain Assessment: Faces Faces Pain Scale: Hurts even more Pain Location: Complains of back pain Pain Intervention(s): Limited activity within patient's tolerance    Home Living                      Prior Function            PT Goals (current goals can now be found  in the care plan section) Progress towards PT goals: Not progressing toward goals - comment    Frequency  Min 2X/week    PT Plan Current plan remains appropriate    Co-evaluation PT/OT/SLP Co-Evaluation/Treatment: Yes Reason for Co-Treatment: Complexity of the patient's impairments (multi-system involvement);For patient/therapist safety PT goals addressed during session: Mobility/safety with mobility;Balance       End of Session   Activity Tolerance: Patient limited by pain Patient left: in bed;with call bell/phone within reach;with nursing/sitter in room     Time: 0940-1002 PT Time Calculation (min) (ACUTE ONLY): 22 min  Charges:  $Therapeutic Activity: 8-22 mins                    G Codes:      Jacqualyn Posey 01/03/2015, 10:16 AM  01/03/2015 Jacqualyn Posey PTA [phone] pager [phone] office

## 2015-01-03 NOTE — Progress Notes (Signed)
PROGRESS NOTE  Evan [name] PRF:[id number] DOB: 1932/07/07 DOA: 12/30/2014 PCP: Foye Spurling, MD  Assessment/Plan: Acute respiratory failure with hypoxia:  A) Aortic stenosis, severe  B) Acute on chronic diastolic CHF (congestive heart failure), NYHA class 2 Comfort care focus   Symptomatic anemia Comfort care focus   Type 2 diabetes mellitus -Hold control acutely -Provide sliding scale insulin    H/O atrial fibrillation -Currently maintaining sinus rhythm -Continue Cardizem but may need to decrease dosage while diuresing to minimize hypotension   CKD (chronic kidney disease), stage III/History of renal transplant -comfort care focus   Positive D dimer -V/Q scan negative for PE  Elevated troponin -More reflective of renal dysfunction and heart failure with a likely component of demand ischemia -Patient not having chest pain or other classic ischemic symptoms   History of CVA/ FTT -Recent outpatient visit with neurologist -Differential at that time included the possibility of superimposed parkinsonism, Parkinson's disease versus dementia with Lewy bodies this in regards to patient's progressive symptoms of general physical and cognitive decline with weakness tremor loss of appetite shortness of breath fatigue and increasing falls patient was also reporting hallucinations and confusion at night and therefore was felt not to be a good candidate for additional medical therapy. -As noted above palliative care was recommended at that time. -SLP    Decubitus ulcer of sacral region, stage 2- present on admission -Consult WOC RN -Air mattress to bed an air cushion pad to chair   Plan is for patient to go to inpatient hospice  Code Status: DNR Family Communication: called granddaughter and discussed palliative care consult 5/27 Disposition Plan:    Consultants:  Cards  GI  Palliative care  Procedures:  Echo: Study Conclusions  - Left  ventricle: The cavity size was normal. Wall thickness was increased in a pattern of mild LVH. Systolic function was mildly reduced. The estimated ejection fraction was in the range of 45% to 50%. Diffuse hypokinesis. Doppler parameters are consistent with high ventricular filling pressure. - Aortic valve: A bicuspid morphology cannot be excluded; severely thickened, severely calcified leaflets. There was critical stenosis. There was mild regurgitation. Valve area (VTI): 0.65 cm^2. Valve area (Vmax): 0.73 cm^2. Valve area (Vmean): 0.6 cm^2. - Mitral valve: Calcified annulus. There was mild regurgitation. - Left atrium: The atrium was mildly dilated. - Pulmonary arteries: PA peak pressure: 38 mm Hg (S). - Pericardium, extracardiac: A trivial pericardial effusion was identified.    HPI/Subjective: Speech is difficult to understand, does not appear in any pain  Objective: Filed Vitals:   01/03/15 1246  BP: 89/44  Pulse: 81  Temp:   Resp: 22    Intake/Output Summary (Last 24 hours) at 01/03/15 1357 Last data filed at 01/03/15 0914  Gross per 24 hour  Intake    110 ml  Output      0 ml  Net    110 ml   Filed Weights   01/01/15 0526 01/02/15 0549 01/03/15 0500  Weight: 83.4 kg (183 lb 13.8 oz) 72.2 kg (159 lb 2.8 oz) 73.1 kg (161 lb 2.5 oz)    Exam:   General:  Voice difficult to understand  Cardiovascular: rrr  Respiratory: diminshed  Abdomen: +BS, soft  Musculoskeletal: weak  Data Reviewed: Basic Metabolic Panel:  Recent Labs Lab 12/30/14 0404 12/31/14 0649 01/01/15 0440  NA 138 138 139  K 4.3 4.2 4.1  CL 108 105 106  CO2 22 21* 19*  GLUCOSE 189* 134* 161*  BUN 33* 40*  43*  CREATININE 1.48* 1.75* 1.67*  CALCIUM 8.6* 8.7* 8.6*   Liver Function Tests: No results for input(s): AST, ALT, ALKPHOS, BILITOT, PROT, ALBUMIN in the last 168 hours. No results for input(s): LIPASE, AMYLASE in the last 168 hours. No results for input(s): AMMONIA  in the last 168 hours. CBC:  Recent Labs Lab 12/30/14 0404 12/31/14 0700  WBC 9.4 10.6*  HGB 8.7* 9.1*  HCT 25.9* 26.8*  MCV 79.7 79.3  PLT 219 216   Cardiac Enzymes:  Recent Labs Lab 12/30/14 0404  TROPONINI 0.27*   BNP (last 3 results)  Recent Labs  12/30/14 0404  BNP 629.4*    ProBNP (last 3 results) No results for input(s): PROBNP in the last 8760 hours.  CBG:  Recent Labs Lab 01/02/15 1218 01/02/15 1638 01/02/15 2239 01/03/15 0818 01/03/15 1139  GLUCAP 156* 177* 187* 172* 175*    No results found for this or any previous visit (from the past 240 hour(s)).   Studies: No results found.  Scheduled Meds: . sodium chloride   Intravenous Once  . aspirin  81 mg Oral Daily  . diltiazem  300 mg Oral Daily  . famotidine  10 mg Oral Daily  . heparin  5,000 Units Subcutaneous 3 times per day  . hydrALAZINE  25 mg Oral BID  . insulin aspart  0-5 Units Subcutaneous QHS  . insulin aspart  0-9 Units Subcutaneous TID WC  . megestrol  400 mg Oral Daily  . mycophenolate  180 mg Oral BID  . polyethylene glycol  17 g Oral Daily  . senna-docusate  1 tablet Oral QHS  . sodium bicarbonate  650 mg Oral BID  . sodium chloride  3 mL Intravenous Q12H  . 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,2013   History of renal transplant   H/O atrial fibrillation without current medication   Aortic stenosis, severe   FTT (failure to thrive) in adult   Acute on chronic diastolic CHF (congestive heart failure), NYHA class 2   Diabetes mellitus type 2, controlled   CKD (chronic kidney disease), stage III   Symptomatic anemia   Acute respiratory failure with hypoxia   Decubitus ulcer of sacral region, stage 2   Positive D dimer   Acute diastolic heart failure, NYHA class 2   Palliative care  encounter   Ischemic chest pain   Dyspnea and respiratory abnormality    Time spent: 15 min    Evan [name]  [facility] Pager [phone]. If 7PM-7AM, please contact night-coverage at [email or web address], password [facility] 01/03/2015, 1:57 PM  LOS: 4 days

## 2015-01-03 NOTE — Progress Notes (Signed)
Patient [id number] Evan [name]      DOB: Feb 19, 1932      KCL:[id number]   Palliative Medicine Team at [facility] Progress Note    Subjective: Taking less PO. Having some chest tightness and conversational dyspnea. Less interactive today than yesterday per family.    Filed Vitals:   01/03/15 1246  BP: 89/44  Pulse: 81  Temp:   Resp: 22   Physical exam: GEN: alert, conversational dyspnea HEENT: , mmm CV: reg rate LUNGS: mild tachypnea EXT: warm  CBC    Component Value Date/Time   WBC 10.6* 12/31/2014 0700   RBC 3.38* 12/31/2014 0700   RBC 3.04* 12/30/2014 0922   HGB 9.1* 12/31/2014 0700   HCT 26.8* 12/31/2014 0700   PLT 216 12/31/2014 0700   MCV 79.3 12/31/2014 0700   MCH 26.9 12/31/2014 0700   MCHC 34.0 12/31/2014 0700   RDW 14.2 12/31/2014 0700   LYMPHSABS 1.7 11/12/2014 1948   MONOABS 0.4 11/12/2014 1948   EOSABS 0.1 11/12/2014 1948   BASOSABS 0.0 11/12/2014 1948    CMP     Component Value Date/Time   NA 139 01/01/2015 0440   K 4.1 01/01/2015 0440   CL 106 01/01/2015 0440   CO2 19* 01/01/2015 0440   GLUCOSE 161* 01/01/2015 0440   BUN 43* 01/01/2015 0440   CREATININE 1.67* 01/01/2015 0440   CALCIUM 8.6* 01/01/2015 0440   CALCIUM 9.2 04/02/2013 1007   PROT 7.5 11/12/2014 1948   ALBUMIN 4.0 11/18/2014 1051   AST 20 11/12/2014 1948   ALT 23 11/12/2014 1948   ALKPHOS 56 11/12/2014 1948   BILITOT 0.5 11/12/2014 1948   GFRNONAA 37* 01/01/2015 0440   GFRAA 43* 01/01/2015 0440     Assessment and plan: Code Status:  DNR  Goals of Care: Comfort. Family has decided home would be too difficult and are exploring residential hospice.  [facility] liaison to meet with them. I think this is appropriate> BP declining as well as functional status. Suspect his prognosis is transitioning to days.    3. Symptom Management:  Chest Pain/Dyspnea- switched to oral roxanol which I think is fine for now. Will give one time dose of morphine as he is uncomfortable at time of  my visit. Will also order a fan at bedside which may help with his dyspnea.    4. Palliative Prophylaxis:  Stool Softner: senna  5. Prognosis: days to weeks  6. Discharge Planning: residential hospice   Care plan was discussed with patient/family  Thank you for allowing the Palliative Medicine Team to assist in the care of this patient.  Total Time: 15 minutes Greater than 50% of this time was spent counseling and coordinating care related to the above assessment and plan.   Doran Clay, DO  01/01/2015, 12:59 PM  Please contact Palliative Medicine Team phone at [phone] for questions and concerns.

## 2015-01-04 DIAGNOSIS — J9601 Acute respiratory failure with hypoxia: Secondary | ICD-10-CM

## 2015-01-04 LAB — GLUCOSE, CAPILLARY: Glucose-Capillary: 174 mg/dL — ABNORMAL HIGH (ref 65–99)

## 2015-01-04 MED ORDER — MORPHINE SULFATE (CONCENTRATE) 10 MG/0.5ML PO SOLN: 5.0000 mg | ORAL | Status: AC | PRN

## 2015-01-04 NOTE — Progress Notes (Signed)
Report called to [facility].  

## 2015-01-04 NOTE — Progress Notes (Signed)
Domenic Polite to be D/C'd Skilled nursing facility per MD order.  Discussed with the patient and all questions fully answered.  VSS, Skin clean, dry and intact without evidence of skin break down, no evidence of skin tears noted. IV catheter discontinued intact. Site without signs and symptoms of complications. Dressing and pressure applied.  An After Visit Summary/Yellow Packet was printed and given to EMS     Patient escorted via stretcher, and D/C hospice via EMS.  Audria Nine F 01/04/2015 11:34 AM

## 2015-01-04 NOTE — Discharge Summary (Signed)
Physician Discharge Summary  Evan [name] QZE:[id number] DOB: 02-Oct-1931 DOA: 12/30/2014  PCP: Foye Spurling, MD  Admit date: 12/30/2014 Discharge date: 01/04/2015  Time spent: 35 minutes  Recommendations for Outpatient Follow-up:  1. To [facility]  Discharge Diagnoses:  Active Problems:   Type 2 diabetes mellitus   History of CVA (cerebrovascular accident)/left pontine infarct June 20,2013   History of renal transplant   H/O atrial fibrillation without current medication   Aortic stenosis, severe   FTT (failure to thrive) in adult   Acute on chronic diastolic CHF (congestive heart failure), NYHA class 2   Diabetes mellitus type 2, controlled   CKD (chronic kidney disease), stage III   Symptomatic anemia   Acute respiratory failure with hypoxia   Decubitus ulcer of sacral region, stage 2   Positive D dimer   Acute diastolic heart failure, NYHA class 2   Palliative care encounter   Ischemic chest pain   Dyspnea and respiratory abnormality   Discharge Condition: terminal  Diet recommendation: DYS thin  Filed Weights   01/02/15 0549 01/03/15 0500 01/04/15 0551  Weight: 72.2 kg (159 lb 2.8 oz) 73.1 kg (161 lb 2.5 oz) 73.3 kg (161 lb 9.6 oz)    History of present illness:  This is a very pleasant and soft-spoken 81 year old male patient with history of diabetes, chronic kidney disease stage III status post renal transplant 2012, paroxysmal atrial fibrillation on rate control agents only, remote CVA with residual right-sided hemiparesis, hypertension, grade 2 diastolic dysfunction as well as severe aortic stenosis (based on echocardiogram in 2013), and sacral decubitus. He was sent over from the skilled nursing facility due to acute onset of shortness of breath. Patient reports what he describes as a coughing spell overnight but was unable to produce any phlegm. After this he developed difficulty breathing. Patient is minimally ambulatory at baseline and has to walk with  assistive devices as well as people helping him because of his residual right-sided hemiparesis. He does report some chronic upper back and neck pain since his CVA. He typically has to sleep upright in a chair at the nursing facility because of the neck and back pain stating that the hospital bed is uncomfortable. He reports he does have chronic anemia and periodically has to come to short stay to receive transfusions. He also is aware of a decubitus on his sacrum and states he is not getting up as much as he did at home at the nursing facility. He denies any chest pain or orthopnea.  Upon initial presentation to the ER patient was afebrile but was quite tachypnea with a respiratory rate of 30, heart rate was in the 90s but was sinus, BP was 141/47, room air saturations were less than 92% so oxygen was applied and saturations were 93% on 2 L. Chest x-ray completed in the ER revealed bilateral airspace disease consistent with edema. BNP was 629, troponins have been measured several times that have ranged between 0.27 and 0.35. EKG without any acute ischemic changes and unchanged from prior EKGs in the past 12 months. D-dimer was elevated at 1.15. BUN 33 and creatinine 1.48 which was around the patient on, glucose 189, hemoglobin has drifted down to 8.7 from a previous baseline of 12.2 in March 2016. If you have serial hemoglobins in April demonstrated hemoglobin was down to 10.6 on 11/18/14. Stool itself was brown but was Hemoccult-positive. Cardiology has been consulted.  Hospital Course:  Acute respiratory failure with hypoxia:  A) Aortic stenosis,  severe  B) Acute on chronic diastolic CHF (congestive heart failure), NYHA class 2 Comfort care focus   Symptomatic anemia Comfort care focus   Type 2 diabetes mellitus -comfort care focus   H/O atrial fibrillation -Currently maintaining sinus rhythm -d/c cardizem as comfort care focus   CKD (chronic kidney disease), stage III/History of renal  transplant -comfort care focus   Positive D dimer -V/Q scan negative for PE  Elevated troponin -More reflective of renal dysfunction and heart failure with a likely component of demand ischemia -Patient not having chest pain or other classic ischemic symptoms   History of CVA/ FTT -Recent outpatient visit with neurologist -Differential at that time included the possibility of superimposed parkinsonism, Parkinson's disease versus dementia with Lewy bodies this in regards to patient's progressive symptoms of general physical and cognitive decline with weakness tremor loss of appetite shortness of breath fatigue and increasing falls patient was also reporting hallucinations and confusion at night and therefore was felt not to be a good candidate for additional medical therapy. -As noted above palliative care was recommended at that time. -SLP    Decubitus ulcer of sacral region, stage 2- present on admission -Consult WOC RN -Air mattress to bed an air cushion pad to chair    Procedures:    Consultations:  Cards  GI  Palliative care  Discharge Exam: Filed Vitals:   01/04/15 0551  BP: 103/49  Pulse: 86  Temp: 98.1 F (36.7 C)  Resp: 16    General: resting   Discharge Instructions   Discharge Instructions    Discharge instructions    Complete by:  As directed   DYS 3 thin     Increase activity slowly    Complete by:  As directed           Current Discharge Medication List    START taking these medications   Details  Morphine Sulfate (MORPHINE CONCENTRATE) 10 MG/0.5ML SOLN concentrated solution Take 0.25 mLs (5 mg total) by mouth every 2 (two) hours as needed for moderate pain, severe pain or shortness of breath. Qty: 42 mL      CONTINUE these medications which have NOT CHANGED   Details  ranitidine (ZANTAC) 300 MG capsule Take 300 mg by mouth daily.      STOP taking these medications     calcium carbonate (TUMS - DOSED IN MG ELEMENTAL CALCIUM)  500 MG chewable tablet      dextrose (GLUTOSE) 40 % GEL      diltiazem (TIAZAC) 300 MG 24 hr capsule      diphenhydramine-acetaminophen (TYLENOL PM EXTRA STRENGTH) 25-500 MG TABS      geriatric multivitamins-minerals (ELDERTONIC/GEVRABON) ELIX      glipiZIDE (GLUCOTROL) 5 MG tablet      guaiFENesin (MUCINEX) 600 MG 12 hr tablet      hydrALAZINE (APRESOLINE) 100 MG tablet      insulin aspart (NOVOLOG) 100 UNIT/ML injection      loratadine (CLARITIN) 10 MG tablet      lubiprostone (AMITIZA) 24 MCG capsule      mycophenolate (MYFORTIC) 180 MG EC tablet      oxyCODONE-acetaminophen (PERCOCET) 5-325 MG per tablet      polyethylene glycol (MIRALAX / GLYCOLAX) packet      sodium bicarbonate 650 MG tablet      tacrolimus (PROGRAF) 1 MG capsule      aspirin 81 MG tablet      megestrol (MEGACE) 400 MG/10ML suspension  No Known Allergies    The results of significant diagnostics from this hospitalization (including imaging, microbiology, ancillary and laboratory) are listed below for reference.    Significant Diagnostic Studies: Nm Pulmonary Perf And Vent  12/30/2014   CLINICAL DATA:  Shortness of breath.  History of kidney transplant.  EXAM: NUCLEAR MEDICINE VENTILATION - PERFUSION LUNG SCAN  TECHNIQUE: Ventilation images were obtained in multiple projections using inhaled aerosol Tc-62m DTPA. Perfusion images were obtained in multiple projections after intravenous injection of Tc-71m MAA.  RADIOPHARMACEUTICALS:  40 Technetium-83m DTPA aerosol inhalation and 6 Technetium-51m MAA IV  COMPARISON:  Chest radiographs obtained earlier today.  FINDINGS: Ventilation: Anterior artifact, possibly related to the ventilation mask. Taking this into account, normal ventilation of both lungs.  Perfusion: Anterior artifact, possibly related to the ventilation mask. Taking this into account, normal perfusion of both lungs with no perfusion defects.  IMPRESSION: No evidence of pulmonary  embolism.   Electronically Signed   By: Claudie Revering M.D.   On: 12/30/2014 17:55   Dg Chest Port 1 View  12/30/2014   CLINICAL DATA:  Shortness of breath  EXAM: PORTABLE CHEST - 1 VIEW  COMPARISON:  10/29/2014  FINDINGS: There is bilateral fairly symmetric airspace disease, not seen previously. Possible small pleural effusions. No cardiomegaly. Stable mediastinal contours.  Limited evaluation the apices due to patient positioning.  No acute osseous findings.  IMPRESSION: Bilateral airspace disease, favor edema over multi focal infection.   Electronically Signed   By: Monte Fantasia M.D.   On: 12/30/2014 03:55    Microbiology: No results found for this or any previous visit (from the past 240 hour(s)).   Labs: Basic Metabolic Panel:  Recent Labs Lab 12/30/14 0404 12/31/14 0649 01/01/15 0440  NA 138 138 139  K 4.3 4.2 4.1  CL 108 105 106  CO2 22 21* 19*  GLUCOSE 189* 134* 161*  BUN 33* 40* 43*  CREATININE 1.48* 1.75* 1.67*  CALCIUM 8.6* 8.7* 8.6*   Liver Function Tests: No results for input(s): AST, ALT, ALKPHOS, BILITOT, PROT, ALBUMIN in the last 168 hours. No results for input(s): LIPASE, AMYLASE in the last 168 hours. No results for input(s): AMMONIA in the last 168 hours. CBC:  Recent Labs Lab 12/30/14 0404 12/31/14 0700  WBC 9.4 10.6*  HGB 8.7* 9.1*  HCT 25.9* 26.8*  MCV 79.7 79.3  PLT 219 216   Cardiac Enzymes:  Recent Labs Lab 12/30/14 0404  TROPONINI 0.27*   BNP: BNP (last 3 results)  Recent Labs  12/30/14 0404  BNP 629.4*    ProBNP (last 3 results) No results for input(s): PROBNP in the last 8760 hours.  CBG:  Recent Labs Lab 01/02/15 2239 01/03/15 0818 01/03/15 1139 01/03/15 1724 01/03/15 2138  GLUCAP 187* 172* 175* 202* 202*       Signed:  Eliseo Squires, Lyden Redner  [facility] 01/04/2015, 7:31 AM

## 2015-01-04 NOTE — Care Management Note (Signed)
Case Management Note  Patient Details  Name: Barney Russomanno MRN: [id number] Date of Birth: Jun 18, 1932  Subjective/Objective:        Patient for dc to [facility] today, CSW following.            Action/Plan:   Expected Discharge Date:  01/03/15               Expected Discharge Plan:  Hospice Medical Facility  In-House Referral:  Clinical Social Work  Discharge planning Services  CM Consult  Post Acute Care Choice:    Choice offered to:  Spouse, Adult Children  DME Arranged:    DME Agency:     HH Arranged:    [place] Agency:     Status of Service:  Completed, signed off  Medicare Important Message Given:  Yes Date Medicare IM Given:  01/03/15 Medicare IM give by:  debbie swist rn bsn cm Date Additional Medicare IM Given:    Additional Medicare Important Message give by:     If discussed at [place] of Stay Meetings, dates discussed:    Additional Comments:  Zenon Mayo, RN 01/04/2015, 3:41 PM

## 2015-01-04 NOTE — Consult Note (Signed)
[facility] Liaison:  [employer] available for [name] today. Family completed registration paper work yesterday afternoon. Dr. Orpah Melter to assume care per family request. Please fax discharge summary to [phone]. RN please call report to [phone]. Please arrange transport for [name] to arrive before noon. Thank you. Evan Conte LCSW [phone]

## 2015-02-04 DEATH — deceased

## 2015-03-10 ENCOUNTER — Ambulatory Visit: Payer: Medicare Other | Admitting: Podiatry

## 2023-08-05 ENCOUNTER — Encounter (HOSPITAL_COMMUNITY): Payer: Self-pay | Admitting: *Deleted
